# Patient Record
Sex: Female | Born: 1985
Health system: Southern US, Community
[De-identification: ages and names within clinical notes are randomized; demographics above are authoritative.]

## PROBLEM LIST (undated history)

## (undated) DIAGNOSIS — I10 Essential (primary) hypertension: Secondary | ICD-10-CM

## (undated) DIAGNOSIS — M329 Systemic lupus erythematosus, unspecified: Secondary | ICD-10-CM

## (undated) DIAGNOSIS — L409 Psoriasis, unspecified: Secondary | ICD-10-CM

## (undated) DIAGNOSIS — Z9981 Dependence on supplemental oxygen: Secondary | ICD-10-CM

## (undated) DIAGNOSIS — J45909 Unspecified asthma, uncomplicated: Secondary | ICD-10-CM

## (undated) DIAGNOSIS — E669 Obesity, unspecified: Secondary | ICD-10-CM

## (undated) DIAGNOSIS — J849 Interstitial pulmonary disease, unspecified: Secondary | ICD-10-CM

## (undated) DIAGNOSIS — R51 Headache: Secondary | ICD-10-CM

## (undated) DIAGNOSIS — Z9289 Personal history of other medical treatment: Secondary | ICD-10-CM

## (undated) DIAGNOSIS — J189 Pneumonia, unspecified organism: Secondary | ICD-10-CM

## (undated) DIAGNOSIS — I2699 Other pulmonary embolism without acute cor pulmonale: Secondary | ICD-10-CM

## (undated) DIAGNOSIS — G4730 Sleep apnea, unspecified: Secondary | ICD-10-CM

## (undated) DIAGNOSIS — O24419 Gestational diabetes mellitus in pregnancy, unspecified control: Secondary | ICD-10-CM

## (undated) DIAGNOSIS — G894 Chronic pain syndrome: Secondary | ICD-10-CM

## (undated) DIAGNOSIS — F32A Depression, unspecified: Secondary | ICD-10-CM

## (undated) DIAGNOSIS — I319 Disease of pericardium, unspecified: Secondary | ICD-10-CM

## (undated) DIAGNOSIS — IMO0002 Reserved for concepts with insufficient information to code with codable children: Secondary | ICD-10-CM

## (undated) DIAGNOSIS — M069 Rheumatoid arthritis, unspecified: Secondary | ICD-10-CM

## (undated) DIAGNOSIS — F329 Major depressive disorder, single episode, unspecified: Secondary | ICD-10-CM

## (undated) DIAGNOSIS — R519 Headache, unspecified: Secondary | ICD-10-CM

## (undated) HISTORY — DX: Systemic lupus erythematosus, unspecified: M32.9

## (undated) HISTORY — PX: INDUCED ABORTION: SHX677

## (undated) HISTORY — DX: Disease of pericardium, unspecified: I31.9

---
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
## 2004-01-30 ENCOUNTER — Emergency Department (HOSPITAL_COMMUNITY): Admission: EM | Admit: 2004-01-30 | Discharge: 2004-01-30 | Payer: Self-pay | Admitting: Emergency Medicine

## 2004-01-31 ENCOUNTER — Inpatient Hospital Stay (HOSPITAL_COMMUNITY): Admission: AD | Admit: 2004-01-31 | Discharge: 2004-01-31 | Payer: Self-pay | Admitting: Family Medicine

## 2004-05-05 ENCOUNTER — Ambulatory Visit (HOSPITAL_COMMUNITY): Admission: RE | Admit: 2004-05-05 | Discharge: 2004-05-05 | Payer: Self-pay | Admitting: *Deleted

## 2004-06-26 DIAGNOSIS — O24419 Gestational diabetes mellitus in pregnancy, unspecified control: Secondary | ICD-10-CM

## 2004-06-26 HISTORY — DX: Gestational diabetes mellitus in pregnancy, unspecified control: O24.419

## 2004-07-13 ENCOUNTER — Inpatient Hospital Stay (HOSPITAL_COMMUNITY): Admission: AD | Admit: 2004-07-13 | Discharge: 2004-07-13 | Payer: Self-pay | Admitting: *Deleted

## 2004-07-15 ENCOUNTER — Inpatient Hospital Stay (HOSPITAL_COMMUNITY): Admission: AD | Admit: 2004-07-15 | Discharge: 2004-07-15 | Payer: Self-pay | Admitting: *Deleted

## 2004-07-16 ENCOUNTER — Ambulatory Visit (HOSPITAL_COMMUNITY): Admission: RE | Admit: 2004-07-16 | Discharge: 2004-07-16 | Payer: Self-pay | Admitting: Obstetrics and Gynecology

## 2004-07-18 ENCOUNTER — Inpatient Hospital Stay (HOSPITAL_COMMUNITY): Admission: AD | Admit: 2004-07-18 | Discharge: 2004-07-21 | Payer: Self-pay | Admitting: Obstetrics and Gynecology

## 2004-10-06 ENCOUNTER — Emergency Department (HOSPITAL_COMMUNITY): Admission: EM | Admit: 2004-10-06 | Discharge: 2004-10-06 | Payer: Self-pay | Admitting: Family Medicine

## 2005-11-13 ENCOUNTER — Emergency Department (HOSPITAL_COMMUNITY): Admission: EM | Admit: 2005-11-13 | Discharge: 2005-11-13 | Payer: Self-pay | Admitting: Emergency Medicine

## 2005-11-14 ENCOUNTER — Ambulatory Visit: Admission: AD | Admit: 2005-11-14 | Discharge: 2005-11-14 | Payer: Self-pay | Admitting: Obstetrics and Gynecology

## 2005-11-14 ENCOUNTER — Encounter (INDEPENDENT_AMBULATORY_CARE_PROVIDER_SITE_OTHER): Payer: Self-pay | Admitting: *Deleted

## 2005-11-24 ENCOUNTER — Encounter: Payer: Self-pay | Admitting: Pulmonary Disease

## 2005-11-24 ENCOUNTER — Encounter: Payer: Self-pay | Admitting: Obstetrics and Gynecology

## 2005-11-24 ENCOUNTER — Emergency Department (HOSPITAL_COMMUNITY): Admission: EM | Admit: 2005-11-24 | Discharge: 2005-11-25 | Payer: Self-pay | Admitting: Emergency Medicine

## 2005-12-21 ENCOUNTER — Emergency Department (HOSPITAL_COMMUNITY): Admission: EM | Admit: 2005-12-21 | Discharge: 2005-12-21 | Payer: Self-pay | Admitting: Emergency Medicine

## 2006-01-04 ENCOUNTER — Emergency Department (HOSPITAL_COMMUNITY): Admission: EM | Admit: 2006-01-04 | Discharge: 2006-01-04 | Payer: Self-pay | Admitting: *Deleted

## 2006-02-21 ENCOUNTER — Emergency Department (HOSPITAL_COMMUNITY): Admission: EM | Admit: 2006-02-21 | Discharge: 2006-02-21 | Payer: Self-pay | Admitting: Emergency Medicine

## 2006-02-24 ENCOUNTER — Emergency Department (HOSPITAL_COMMUNITY): Admission: EM | Admit: 2006-02-24 | Discharge: 2006-02-24 | Payer: Self-pay | Admitting: Pediatric Gastroenterology

## 2006-04-25 ENCOUNTER — Emergency Department (HOSPITAL_COMMUNITY): Admission: EM | Admit: 2006-04-25 | Discharge: 2006-04-25 | Payer: Self-pay | Admitting: Emergency Medicine

## 2006-06-15 ENCOUNTER — Emergency Department (HOSPITAL_COMMUNITY): Admission: EM | Admit: 2006-06-15 | Discharge: 2006-06-15 | Payer: Self-pay | Admitting: Emergency Medicine

## 2006-06-29 ENCOUNTER — Emergency Department (HOSPITAL_COMMUNITY): Admission: EM | Admit: 2006-06-29 | Discharge: 2006-06-29 | Payer: Self-pay | Admitting: Emergency Medicine

## 2006-07-18 ENCOUNTER — Ambulatory Visit (HOSPITAL_COMMUNITY): Admission: RE | Admit: 2006-07-18 | Discharge: 2006-07-18 | Payer: Self-pay | Admitting: Internal Medicine

## 2006-07-18 ENCOUNTER — Ambulatory Visit: Payer: Self-pay | Admitting: Family Medicine

## 2006-07-20 ENCOUNTER — Ambulatory Visit: Payer: Self-pay | Admitting: Family Medicine

## 2006-07-20 ENCOUNTER — Ambulatory Visit: Payer: Self-pay | Admitting: *Deleted

## 2006-07-23 ENCOUNTER — Ambulatory Visit: Payer: Self-pay | Admitting: Family Medicine

## 2006-08-02 ENCOUNTER — Ambulatory Visit: Payer: Self-pay | Admitting: Family Medicine

## 2006-08-03 ENCOUNTER — Ambulatory Visit (HOSPITAL_COMMUNITY): Admission: RE | Admit: 2006-08-03 | Discharge: 2006-08-03 | Payer: Self-pay | Admitting: Internal Medicine

## 2006-08-13 ENCOUNTER — Ambulatory Visit (HOSPITAL_COMMUNITY): Admission: RE | Admit: 2006-08-13 | Discharge: 2006-08-13 | Payer: Self-pay | Admitting: Internal Medicine

## 2006-08-13 ENCOUNTER — Ambulatory Visit: Payer: Self-pay | Admitting: Family Medicine

## 2006-08-14 ENCOUNTER — Ambulatory Visit: Payer: Self-pay | Admitting: Family Medicine

## 2006-08-14 ENCOUNTER — Ambulatory Visit: Payer: Self-pay | Admitting: Internal Medicine

## 2006-08-14 ENCOUNTER — Inpatient Hospital Stay (HOSPITAL_COMMUNITY): Admission: AD | Admit: 2006-08-14 | Discharge: 2006-08-21 | Payer: Self-pay | Admitting: Internal Medicine

## 2006-08-17 ENCOUNTER — Encounter: Payer: Self-pay | Admitting: Internal Medicine

## 2006-08-22 ENCOUNTER — Ambulatory Visit: Payer: Self-pay | Admitting: Family Medicine

## 2006-09-07 ENCOUNTER — Ambulatory Visit: Payer: Self-pay | Admitting: Internal Medicine

## 2006-09-13 ENCOUNTER — Ambulatory Visit: Payer: Self-pay | Admitting: Internal Medicine

## 2006-09-27 ENCOUNTER — Ambulatory Visit: Payer: Self-pay | Admitting: Internal Medicine

## 2006-10-11 ENCOUNTER — Ambulatory Visit: Payer: Self-pay | Admitting: Internal Medicine

## 2006-10-18 ENCOUNTER — Ambulatory Visit: Payer: Self-pay | Admitting: Internal Medicine

## 2006-10-25 ENCOUNTER — Ambulatory Visit: Payer: Self-pay | Admitting: Internal Medicine

## 2006-11-08 ENCOUNTER — Ambulatory Visit: Payer: Self-pay | Admitting: Internal Medicine

## 2006-11-21 ENCOUNTER — Ambulatory Visit: Payer: Self-pay | Admitting: Family Medicine

## 2006-11-22 ENCOUNTER — Ambulatory Visit: Payer: Self-pay | Admitting: Internal Medicine

## 2006-11-23 ENCOUNTER — Inpatient Hospital Stay (HOSPITAL_COMMUNITY): Admission: EM | Admit: 2006-11-23 | Discharge: 2006-11-25 | Payer: Self-pay | Admitting: Emergency Medicine

## 2006-11-27 ENCOUNTER — Ambulatory Visit: Payer: Self-pay | Admitting: Internal Medicine

## 2006-12-04 ENCOUNTER — Ambulatory Visit: Payer: Self-pay | Admitting: Internal Medicine

## 2006-12-18 ENCOUNTER — Ambulatory Visit: Payer: Self-pay | Admitting: Internal Medicine

## 2006-12-25 ENCOUNTER — Ambulatory Visit: Payer: Self-pay | Admitting: Internal Medicine

## 2007-01-02 ENCOUNTER — Ambulatory Visit: Payer: Self-pay | Admitting: Internal Medicine

## 2007-01-11 ENCOUNTER — Ambulatory Visit: Payer: Self-pay | Admitting: Internal Medicine

## 2007-01-18 ENCOUNTER — Ambulatory Visit: Payer: Self-pay | Admitting: Internal Medicine

## 2007-01-18 LAB — CONVERTED CEMR LAB
ALT: 121 units/L — ABNORMAL HIGH (ref 0–35)
CO2: 22 meq/L (ref 19–32)
Calcium: 8.7 mg/dL (ref 8.4–10.5)
Chloride: 107 meq/L (ref 96–112)
Creatinine, Ser: 0.42 mg/dL (ref 0.40–1.20)
Eosinophils Relative: 7 % — ABNORMAL HIGH (ref 0–5)
Glucose, Bld: 83 mg/dL (ref 70–99)
HCT: 38.1 % (ref 36.0–46.0)
Hemoglobin: 12.4 g/dL (ref 12.0–15.0)
Lymphocytes Relative: 18 % (ref 12–46)
Lymphs Abs: 1 10*3/uL (ref 0.7–3.3)
Monocytes Absolute: 0.2 10*3/uL (ref 0.2–0.7)
Neutro Abs: 3.7 10*3/uL (ref 1.7–7.7)
RBC: 4.47 M/uL (ref 3.87–5.11)
Total Bilirubin: 0.4 mg/dL (ref 0.3–1.2)
Total Protein: 7.5 g/dL (ref 6.0–8.3)
WBC: 5.2 10*3/uL (ref 4.0–10.5)

## 2007-01-28 ENCOUNTER — Ambulatory Visit: Payer: Self-pay | Admitting: Internal Medicine

## 2007-02-04 ENCOUNTER — Ambulatory Visit: Payer: Self-pay | Admitting: Internal Medicine

## 2007-02-12 ENCOUNTER — Ambulatory Visit: Payer: Self-pay | Admitting: Internal Medicine

## 2007-02-13 ENCOUNTER — Encounter (INDEPENDENT_AMBULATORY_CARE_PROVIDER_SITE_OTHER): Payer: Self-pay | Admitting: Internal Medicine

## 2007-02-13 LAB — CONVERTED CEMR LAB
ALT: 156 units/L — ABNORMAL HIGH (ref 0–35)
Albumin: 4.2 g/dL (ref 3.5–5.2)
Alkaline Phosphatase: 106 units/L (ref 39–117)
CO2: 19 meq/L (ref 19–32)
Glucose, Bld: 88 mg/dL (ref 70–99)
Potassium: 4.2 meq/L (ref 3.5–5.3)
Sodium: 138 meq/L (ref 135–145)
Total Bilirubin: 0.4 mg/dL (ref 0.3–1.2)
Total Protein: 7.9 g/dL (ref 6.0–8.3)

## 2007-02-20 ENCOUNTER — Ambulatory Visit: Payer: Self-pay | Admitting: Internal Medicine

## 2007-03-13 ENCOUNTER — Encounter (INDEPENDENT_AMBULATORY_CARE_PROVIDER_SITE_OTHER): Payer: Self-pay | Admitting: *Deleted

## 2007-08-06 ENCOUNTER — Ambulatory Visit: Payer: Self-pay | Admitting: Internal Medicine

## 2007-09-18 ENCOUNTER — Ambulatory Visit: Payer: Self-pay | Admitting: Internal Medicine

## 2007-09-18 ENCOUNTER — Encounter: Payer: Self-pay | Admitting: Internal Medicine

## 2007-09-18 LAB — CONVERTED CEMR LAB: GC Probe Amp, Genital: NEGATIVE

## 2007-10-02 ENCOUNTER — Ambulatory Visit: Payer: Self-pay | Admitting: Internal Medicine

## 2007-10-09 ENCOUNTER — Encounter: Payer: Self-pay | Admitting: Pulmonary Disease

## 2007-10-09 ENCOUNTER — Encounter (INDEPENDENT_AMBULATORY_CARE_PROVIDER_SITE_OTHER): Payer: Self-pay | Admitting: *Deleted

## 2007-10-09 ENCOUNTER — Ambulatory Visit: Payer: Self-pay | Admitting: *Deleted

## 2007-10-09 ENCOUNTER — Inpatient Hospital Stay (HOSPITAL_COMMUNITY): Admission: EM | Admit: 2007-10-09 | Discharge: 2007-10-17 | Payer: Self-pay | Admitting: Emergency Medicine

## 2007-10-09 ENCOUNTER — Ambulatory Visit: Payer: Self-pay | Admitting: Internal Medicine

## 2007-10-14 ENCOUNTER — Encounter (INDEPENDENT_AMBULATORY_CARE_PROVIDER_SITE_OTHER): Payer: Self-pay | Admitting: *Deleted

## 2007-10-14 ENCOUNTER — Ambulatory Visit: Payer: Self-pay | Admitting: Cardiothoracic Surgery

## 2007-10-14 ENCOUNTER — Encounter: Payer: Self-pay | Admitting: Pulmonary Disease

## 2007-10-31 ENCOUNTER — Ambulatory Visit: Payer: Self-pay | Admitting: Internal Medicine

## 2007-11-15 ENCOUNTER — Ambulatory Visit: Payer: Self-pay | Admitting: Internal Medicine

## 2007-11-15 LAB — CONVERTED CEMR LAB
ALT: 36 units/L — ABNORMAL HIGH (ref 0–35)
AST: 24 units/L (ref 0–37)
Alkaline Phosphatase: 91 units/L (ref 39–117)
BUN: 14 mg/dL (ref 6–23)
Basophils Absolute: 0 10*3/uL (ref 0.0–0.1)
Basophils Relative: 0 % (ref 0–1)
Calcium: 8.9 mg/dL (ref 8.4–10.5)
Chloride: 104 meq/L (ref 96–112)
Creatinine, Ser: 0.51 mg/dL (ref 0.40–1.20)
Eosinophils Absolute: 0 10*3/uL (ref 0.0–0.7)
Eosinophils Relative: 0 % (ref 0–5)
HCT: 44.7 % (ref 36.0–46.0)
Hemoglobin: 13.7 g/dL (ref 12.0–15.0)
MCHC: 30.6 g/dL (ref 30.0–36.0)
Monocytes Absolute: 0.5 10*3/uL (ref 0.1–1.0)
Platelets: 256 10*3/uL (ref 150–400)
RDW: 16.3 % — ABNORMAL HIGH (ref 11.5–15.5)
Total Bilirubin: 0.5 mg/dL (ref 0.3–1.2)

## 2007-11-20 ENCOUNTER — Ambulatory Visit: Payer: Self-pay | Admitting: Internal Medicine

## 2007-12-24 ENCOUNTER — Ambulatory Visit: Payer: Self-pay | Admitting: Internal Medicine

## 2007-12-24 LAB — CONVERTED CEMR LAB
AST: 35 units/L (ref 0–37)
Basophils Absolute: 0.1 10*3/uL (ref 0.0–0.1)
CO2: 23 meq/L (ref 19–32)
Calcium: 9 mg/dL (ref 8.4–10.5)
Creatinine, Ser: 0.58 mg/dL (ref 0.40–1.20)
Eosinophils Absolute: 0 10*3/uL (ref 0.0–0.7)
Eosinophils Relative: 0 % (ref 0–5)
Glucose, Bld: 78 mg/dL (ref 70–99)
HCT: 44.8 % (ref 36.0–46.0)
Hemoglobin: 14.7 g/dL (ref 12.0–15.0)
Lymphocytes Relative: 18 % (ref 12–46)
MCV: 85.2 fL (ref 78.0–100.0)
Monocytes Absolute: 0.5 10*3/uL (ref 0.1–1.0)
RDW: 15.1 % (ref 11.5–15.5)

## 2007-12-25 ENCOUNTER — Ambulatory Visit (HOSPITAL_COMMUNITY): Admission: RE | Admit: 2007-12-25 | Discharge: 2007-12-25 | Payer: Self-pay | Admitting: Internal Medicine

## 2007-12-25 ENCOUNTER — Ambulatory Visit: Payer: Self-pay | Admitting: Internal Medicine

## 2007-12-25 LAB — CONVERTED CEMR LAB
Bilirubin Urine: NEGATIVE
Protein, ur: NEGATIVE mg/dL
Urine Glucose: NEGATIVE mg/dL
Urobilinogen, UA: 0.2 (ref 0.0–1.0)

## 2008-01-01 ENCOUNTER — Ambulatory Visit: Payer: Self-pay | Admitting: Internal Medicine

## 2008-01-01 LAB — CONVERTED CEMR LAB
BUN: 7 mg/dL (ref 6–23)
CO2: 24 meq/L (ref 19–32)
Creatinine, Ser: 0.57 mg/dL (ref 0.40–1.20)
Eosinophils Relative: 0 % (ref 0–5)
Glucose, Bld: 84 mg/dL (ref 70–99)
HCT: 44.5 % (ref 36.0–46.0)
Hemoglobin: 14.6 g/dL (ref 12.0–15.0)
Lymphocytes Relative: 23 % (ref 12–46)
Lymphs Abs: 2.6 10*3/uL (ref 0.7–4.0)
Monocytes Absolute: 0.8 10*3/uL (ref 0.1–1.0)
Monocytes Relative: 7 % (ref 3–12)
Total Bilirubin: 0.7 mg/dL (ref 0.3–1.2)
Total Protein: 7.8 g/dL (ref 6.0–8.3)
WBC: 11.4 10*3/uL — ABNORMAL HIGH (ref 4.0–10.5)

## 2008-01-16 ENCOUNTER — Ambulatory Visit: Payer: Self-pay | Admitting: Internal Medicine

## 2008-01-16 LAB — CONVERTED CEMR LAB
AST: 29 units/L (ref 0–37)
BUN: 8 mg/dL (ref 6–23)
Chloride: 106 meq/L (ref 96–112)
Eosinophils Absolute: 0.1 10*3/uL (ref 0.0–0.7)
Glucose, Bld: 89 mg/dL (ref 70–99)
Lymphs Abs: 2.6 10*3/uL (ref 0.7–4.0)
MCV: 88.1 fL (ref 78.0–100.0)
Monocytes Relative: 8 % (ref 3–12)
Neutrophils Relative %: 69 % (ref 43–77)
Potassium: 3.4 meq/L — ABNORMAL LOW (ref 3.5–5.3)
RBC: 4.69 M/uL (ref 3.87–5.11)
WBC: 11.6 10*3/uL — ABNORMAL HIGH (ref 4.0–10.5)

## 2008-02-19 ENCOUNTER — Ambulatory Visit: Payer: Self-pay | Admitting: Internal Medicine

## 2008-02-19 LAB — CONVERTED CEMR LAB
AST: 17 units/L (ref 0–37)
Alkaline Phosphatase: 65 units/L (ref 39–117)
BUN: 13 mg/dL (ref 6–23)
Basophils Relative: 0 % (ref 0–1)
Creatinine, Ser: 0.73 mg/dL (ref 0.40–1.20)
Eosinophils Absolute: 0 10*3/uL (ref 0.0–0.7)
Eosinophils Relative: 0 % (ref 0–5)
HCT: 41 % (ref 36.0–46.0)
Hemoglobin: 13.5 g/dL (ref 12.0–15.0)
MCHC: 32.9 g/dL (ref 30.0–36.0)
MCV: 86.1 fL (ref 78.0–100.0)
Monocytes Absolute: 0.2 10*3/uL (ref 0.1–1.0)
Monocytes Relative: 2 % — ABNORMAL LOW (ref 3–12)
RBC: 4.76 M/uL (ref 3.87–5.11)

## 2008-03-11 ENCOUNTER — Ambulatory Visit: Payer: Self-pay | Admitting: Internal Medicine

## 2008-03-11 LAB — CONVERTED CEMR LAB
ALT: 28 units/L (ref 0–35)
BUN: 15 mg/dL (ref 6–23)
Basophils Absolute: 0 10*3/uL (ref 0.0–0.1)
CO2: 23 meq/L (ref 19–32)
Calcium: 9.1 mg/dL (ref 8.4–10.5)
Creatinine, Ser: 0.51 mg/dL (ref 0.40–1.20)
Eosinophils Relative: 0 % (ref 0–5)
HCT: 46 % (ref 36.0–46.0)
Hemoglobin: 14.7 g/dL (ref 12.0–15.0)
Lymphocytes Relative: 7 % — ABNORMAL LOW (ref 12–46)
MCHC: 32 g/dL (ref 30.0–36.0)
Monocytes Absolute: 0.3 10*3/uL (ref 0.1–1.0)
Monocytes Relative: 2 % — ABNORMAL LOW (ref 3–12)
RBC: 5.17 M/uL — ABNORMAL HIGH (ref 3.87–5.11)
RDW: 13.3 % (ref 11.5–15.5)
Total Bilirubin: 0.6 mg/dL (ref 0.3–1.2)

## 2008-04-15 ENCOUNTER — Ambulatory Visit: Payer: Self-pay | Admitting: Internal Medicine

## 2008-04-15 LAB — CONVERTED CEMR LAB
Alkaline Phosphatase: 97 units/L (ref 39–117)
BUN: 18 mg/dL (ref 6–23)
CO2: 22 meq/L (ref 19–32)
Creatinine, Ser: 0.64 mg/dL (ref 0.40–1.20)
Eosinophils Absolute: 0.1 10*3/uL (ref 0.0–0.7)
Eosinophils Relative: 1 % (ref 0–5)
Glucose, Bld: 72 mg/dL (ref 70–99)
HCT: 41.5 % (ref 36.0–46.0)
Hemoglobin: 13.8 g/dL (ref 12.0–15.0)
Lymphocytes Relative: 22 % (ref 12–46)
Lymphs Abs: 1.9 10*3/uL (ref 0.7–4.0)
MCV: 88.7 fL (ref 78.0–100.0)
Monocytes Absolute: 1 10*3/uL (ref 0.1–1.0)
Monocytes Relative: 12 % (ref 3–12)
Platelets: 287 10*3/uL (ref 150–400)
RBC: 4.68 M/uL (ref 3.87–5.11)
Total Bilirubin: 0.5 mg/dL (ref 0.3–1.2)
WBC: 8.3 10*3/uL (ref 4.0–10.5)

## 2008-06-02 ENCOUNTER — Emergency Department (HOSPITAL_COMMUNITY): Admission: EM | Admit: 2008-06-02 | Discharge: 2008-06-02 | Payer: Self-pay | Admitting: Emergency Medicine

## 2008-06-10 ENCOUNTER — Ambulatory Visit: Payer: Self-pay | Admitting: Internal Medicine

## 2008-06-12 ENCOUNTER — Ambulatory Visit: Payer: Self-pay | Admitting: Internal Medicine

## 2008-06-24 ENCOUNTER — Ambulatory Visit: Payer: Self-pay | Admitting: Internal Medicine

## 2008-06-26 DIAGNOSIS — M069 Rheumatoid arthritis, unspecified: Secondary | ICD-10-CM

## 2008-06-26 DIAGNOSIS — M329 Systemic lupus erythematosus, unspecified: Secondary | ICD-10-CM

## 2008-06-26 DIAGNOSIS — L409 Psoriasis, unspecified: Secondary | ICD-10-CM

## 2008-06-26 DIAGNOSIS — I10 Essential (primary) hypertension: Secondary | ICD-10-CM

## 2008-06-26 DIAGNOSIS — IMO0002 Reserved for concepts with insufficient information to code with codable children: Secondary | ICD-10-CM

## 2008-06-26 HISTORY — DX: Psoriasis, unspecified: L40.9

## 2008-06-26 HISTORY — DX: Systemic lupus erythematosus, unspecified: M32.9

## 2008-06-26 HISTORY — DX: Reserved for concepts with insufficient information to code with codable children: IMO0002

## 2008-06-26 HISTORY — DX: Rheumatoid arthritis, unspecified: M06.9

## 2008-06-26 HISTORY — DX: Essential (primary) hypertension: I10

## 2008-10-05 ENCOUNTER — Emergency Department (HOSPITAL_COMMUNITY): Admission: EM | Admit: 2008-10-05 | Discharge: 2008-10-05 | Payer: Self-pay | Admitting: Emergency Medicine

## 2008-10-06 ENCOUNTER — Ambulatory Visit (HOSPITAL_COMMUNITY): Admission: RE | Admit: 2008-10-06 | Discharge: 2008-10-06 | Payer: Self-pay | Admitting: Obstetrics & Gynecology

## 2008-10-07 ENCOUNTER — Ambulatory Visit: Payer: Self-pay | Admitting: Obstetrics & Gynecology

## 2008-10-07 ENCOUNTER — Encounter: Payer: Self-pay | Admitting: Obstetrics and Gynecology

## 2008-10-07 LAB — CONVERTED CEMR LAB
Basophils Relative: 1 % (ref 0–1)
Eosinophils Absolute: 0.4 10*3/uL (ref 0.0–0.7)
Hemoglobin: 13.6 g/dL (ref 12.0–15.0)
Hepatitis B Surface Ag: NEGATIVE
Hgb A: 97.3 % (ref 96.8–97.8)
Hgb S Quant: 0 % (ref 0.0–0.0)
Lymphs Abs: 1.5 10*3/uL (ref 0.7–4.0)
MCHC: 33.3 g/dL (ref 30.0–36.0)
MCV: 81.4 fL (ref 78.0–100.0)
Monocytes Absolute: 0.4 10*3/uL (ref 0.1–1.0)
Monocytes Relative: 6 % (ref 3–12)
Neutrophils Relative %: 65 % (ref 43–77)
RBC: 5.01 M/uL (ref 3.87–5.11)
Rh Type: POSITIVE
Rubella: 11.7 intl units/mL — ABNORMAL HIGH
WBC: 6.5 10*3/uL (ref 4.0–10.5)

## 2008-10-12 ENCOUNTER — Ambulatory Visit: Payer: Self-pay | Admitting: Obstetrics & Gynecology

## 2008-10-12 ENCOUNTER — Encounter: Payer: Self-pay | Admitting: Obstetrics & Gynecology

## 2008-10-19 ENCOUNTER — Encounter: Admission: RE | Admit: 2008-10-19 | Discharge: 2008-10-19 | Payer: Self-pay | Admitting: Obstetrics & Gynecology

## 2008-10-19 ENCOUNTER — Ambulatory Visit: Payer: Self-pay | Admitting: Obstetrics & Gynecology

## 2008-10-26 ENCOUNTER — Ambulatory Visit: Payer: Self-pay | Admitting: Family Medicine

## 2008-11-07 ENCOUNTER — Emergency Department (HOSPITAL_COMMUNITY): Admission: EM | Admit: 2008-11-07 | Discharge: 2008-11-08 | Payer: Self-pay | Admitting: Emergency Medicine

## 2008-11-09 ENCOUNTER — Ambulatory Visit: Payer: Self-pay | Admitting: Obstetrics & Gynecology

## 2008-11-09 LAB — CONVERTED CEMR LAB
Anticardiolipin IgA: 9 (ref <13)
Anticardiolipin IgG: <7 (ref <11)

## 2008-11-30 ENCOUNTER — Ambulatory Visit (HOSPITAL_COMMUNITY): Admission: RE | Admit: 2008-11-30 | Discharge: 2008-11-30 | Payer: Self-pay | Admitting: Obstetrics & Gynecology

## 2008-12-07 ENCOUNTER — Ambulatory Visit: Payer: Self-pay | Admitting: Obstetrics & Gynecology

## 2008-12-21 ENCOUNTER — Ambulatory Visit: Payer: Self-pay | Admitting: Obstetrics & Gynecology

## 2008-12-21 ENCOUNTER — Ambulatory Visit: Payer: Self-pay | Admitting: Pulmonary Disease

## 2008-12-21 ENCOUNTER — Ambulatory Visit (HOSPITAL_COMMUNITY): Admission: RE | Admit: 2008-12-21 | Discharge: 2008-12-21 | Payer: Self-pay | Admitting: Family Medicine

## 2008-12-21 DIAGNOSIS — I319 Disease of pericardium, unspecified: Secondary | ICD-10-CM

## 2008-12-21 DIAGNOSIS — R0602 Shortness of breath: Secondary | ICD-10-CM | POA: Insufficient documentation

## 2008-12-21 HISTORY — DX: Disease of pericardium, unspecified: I31.9

## 2008-12-28 ENCOUNTER — Ambulatory Visit: Payer: Self-pay | Admitting: Obstetrics & Gynecology

## 2009-01-11 ENCOUNTER — Ambulatory Visit: Payer: Self-pay | Admitting: Obstetrics & Gynecology

## 2009-01-25 ENCOUNTER — Encounter: Payer: Self-pay | Admitting: Physician Assistant

## 2009-01-25 ENCOUNTER — Other Ambulatory Visit: Payer: Self-pay | Admitting: Obstetrics & Gynecology

## 2009-01-25 ENCOUNTER — Ambulatory Visit: Payer: Self-pay | Admitting: Obstetrics & Gynecology

## 2009-01-25 LAB — CONVERTED CEMR LAB
Platelets: 313 10*3/uL (ref 150–400)
RBC: 4.45 M/uL (ref 3.87–5.11)
WBC: 7.1 10*3/uL (ref 4.0–10.5)

## 2009-01-26 ENCOUNTER — Encounter: Payer: Self-pay | Admitting: Physician Assistant

## 2009-02-08 ENCOUNTER — Ambulatory Visit: Payer: Self-pay | Admitting: Family Medicine

## 2009-02-09 ENCOUNTER — Ambulatory Visit (HOSPITAL_COMMUNITY): Admission: RE | Admit: 2009-02-09 | Discharge: 2009-02-09 | Payer: Self-pay | Admitting: Obstetrics & Gynecology

## 2009-02-15 ENCOUNTER — Ambulatory Visit: Payer: Self-pay | Admitting: Obstetrics & Gynecology

## 2009-02-18 ENCOUNTER — Ambulatory Visit: Payer: Self-pay | Admitting: Obstetrics & Gynecology

## 2009-02-22 ENCOUNTER — Ambulatory Visit: Payer: Self-pay | Admitting: Obstetrics & Gynecology

## 2009-02-25 ENCOUNTER — Ambulatory Visit: Payer: Self-pay | Admitting: Obstetrics & Gynecology

## 2009-03-08 ENCOUNTER — Ambulatory Visit: Payer: Self-pay | Admitting: Obstetrics & Gynecology

## 2009-03-15 ENCOUNTER — Ambulatory Visit: Payer: Self-pay | Admitting: Obstetrics & Gynecology

## 2009-03-22 ENCOUNTER — Ambulatory Visit: Payer: Self-pay | Admitting: Obstetrics & Gynecology

## 2009-03-22 ENCOUNTER — Telehealth: Payer: Self-pay | Admitting: Pulmonary Disease

## 2009-03-29 ENCOUNTER — Encounter: Payer: Self-pay | Admitting: Advanced Practice Midwife

## 2009-03-29 ENCOUNTER — Ambulatory Visit: Payer: Self-pay | Admitting: Obstetrics & Gynecology

## 2009-03-29 LAB — CONVERTED CEMR LAB
ALT: <8 units/L (ref 0–35)
AST: 17 units/L (ref 0–37)
Albumin: 3.3 g/dL — ABNORMAL LOW (ref 3.5–5.2)
CO2: 19 meq/L (ref 19–32)
Calcium: 8.6 mg/dL (ref 8.4–10.5)
Chlamydia, DNA Probe: NEGATIVE
Chloride: 107 meq/L (ref 96–112)
Potassium: 3.9 meq/L (ref 3.5–5.3)
Sodium: 140 meq/L (ref 135–145)
Total Protein: 6.7 g/dL (ref 6.0–8.3)

## 2009-03-30 ENCOUNTER — Encounter: Payer: Self-pay | Admitting: Advanced Practice Midwife

## 2009-03-30 ENCOUNTER — Ambulatory Visit (HOSPITAL_COMMUNITY): Admission: RE | Admit: 2009-03-30 | Discharge: 2009-03-30 | Payer: Self-pay | Admitting: Obstetrics & Gynecology

## 2009-04-05 ENCOUNTER — Ambulatory Visit: Payer: Self-pay | Admitting: Obstetrics & Gynecology

## 2009-04-10 ENCOUNTER — Ambulatory Visit: Payer: Self-pay | Admitting: Advanced Practice Midwife

## 2009-04-10 ENCOUNTER — Inpatient Hospital Stay (HOSPITAL_COMMUNITY): Admission: AD | Admit: 2009-04-10 | Discharge: 2009-04-10 | Payer: Self-pay | Admitting: Obstetrics and Gynecology

## 2009-04-12 ENCOUNTER — Ambulatory Visit: Payer: Self-pay | Admitting: Obstetrics & Gynecology

## 2009-04-16 ENCOUNTER — Ambulatory Visit: Payer: Self-pay | Admitting: Obstetrics & Gynecology

## 2009-04-16 ENCOUNTER — Encounter: Payer: Self-pay | Admitting: Obstetrics & Gynecology

## 2009-04-16 ENCOUNTER — Inpatient Hospital Stay (HOSPITAL_COMMUNITY): Admission: AD | Admit: 2009-04-16 | Discharge: 2009-04-20 | Payer: Self-pay | Admitting: Obstetrics & Gynecology

## 2009-04-19 ENCOUNTER — Ambulatory Visit: Payer: Self-pay | Admitting: Pulmonary Disease

## 2009-05-24 ENCOUNTER — Ambulatory Visit: Payer: Self-pay | Admitting: Pulmonary Disease

## 2009-05-24 DIAGNOSIS — J8410 Pulmonary fibrosis, unspecified: Secondary | ICD-10-CM | POA: Insufficient documentation

## 2009-06-09 ENCOUNTER — Ambulatory Visit: Payer: Self-pay | Admitting: Internal Medicine

## 2009-06-09 LAB — CONVERTED CEMR LAB
ALT: 24 U/L
AST: 23 U/L
Albumin: 4.6 g/dL
Alkaline Phosphatase: 132 U/L — ABNORMAL HIGH
BUN: 11 mg/dL
Basophils Absolute: 0 K/uL
Basophils Relative: 0 %
CO2: 21 meq/L
Calcium: 9.6 mg/dL
Chloride: 105 meq/L
Cholesterol: 165 mg/dL
Creatinine, Ser: 0.51 mg/dL
Eosinophils Absolute: 0 K/uL
Eosinophils Relative: 0 %
Glucose, Bld: 100 mg/dL — ABNORMAL HIGH
HCT: 36.8 %
HDL: 41 mg/dL
Hemoglobin: 11.1 g/dL — ABNORMAL LOW
LDL Cholesterol: 101 mg/dL — ABNORMAL HIGH
Lymphocytes Relative: 24 %
Lymphs Abs: 1.8 K/uL
MCHC: 30.2 g/dL
MCV: 74.5 fL — ABNORMAL LOW
Monocytes Absolute: 0.1 K/uL
Monocytes Relative: 2 % — ABNORMAL LOW
Neutro Abs: 5.7 K/uL
Neutrophils Relative %: 74 %
Platelets: 431 K/uL — ABNORMAL HIGH
Potassium: 4.6 meq/L
Preg, Serum: NEGATIVE
RBC: 4.94 M/uL
RDW: 15.6 % — ABNORMAL HIGH
Sodium: 138 meq/L
Total Bilirubin: 0.2 mg/dL — ABNORMAL LOW
Total CHOL/HDL Ratio: 4
Total Protein: 8.7 g/dL — ABNORMAL HIGH
Triglycerides: 114 mg/dL
VLDL: 23 mg/dL
WBC: 7.6 10*3/microliter

## 2009-06-26 HISTORY — PX: THIGH / KNEE SOFT TISSUE BIOPSY: SUR151

## 2009-08-02 DIAGNOSIS — M3390 Dermatopolymyositis, unspecified, organ involvement unspecified: Secondary | ICD-10-CM

## 2009-09-01 ENCOUNTER — Ambulatory Visit: Payer: Self-pay | Admitting: Internal Medicine

## 2009-09-20 DIAGNOSIS — J8489 Other specified interstitial pulmonary diseases: Secondary | ICD-10-CM | POA: Insufficient documentation

## 2009-09-20 DIAGNOSIS — J849 Interstitial pulmonary disease, unspecified: Secondary | ICD-10-CM | POA: Insufficient documentation

## 2010-02-24 ENCOUNTER — Ambulatory Visit: Payer: Self-pay | Admitting: Internal Medicine

## 2010-02-24 LAB — CONVERTED CEMR LAB: Preg, Serum: NEGATIVE

## 2010-02-25 ENCOUNTER — Ambulatory Visit: Payer: Self-pay | Admitting: Internal Medicine

## 2010-04-19 ENCOUNTER — Encounter (INDEPENDENT_AMBULATORY_CARE_PROVIDER_SITE_OTHER): Payer: Self-pay | Admitting: Internal Medicine

## 2010-04-19 LAB — CONVERTED CEMR LAB: Preg, Serum: NEGATIVE

## 2010-07-17 ENCOUNTER — Encounter: Payer: Self-pay | Admitting: Internal Medicine

## 2010-07-17 ENCOUNTER — Encounter: Payer: Self-pay | Admitting: *Deleted

## 2010-07-18 ENCOUNTER — Encounter: Payer: Self-pay | Admitting: *Deleted

## 2010-09-29 ENCOUNTER — Emergency Department (HOSPITAL_COMMUNITY)
Admission: EM | Admit: 2010-09-29 | Discharge: 2010-09-29 | Disposition: A | Discharge status: Home / Self Care | Payer: Self-pay | Attending: Emergency Medicine | Admitting: Emergency Medicine

## 2010-09-29 ENCOUNTER — Emergency Department (HOSPITAL_COMMUNITY): Payer: Self-pay

## 2010-09-29 DIAGNOSIS — R079 Chest pain, unspecified: Secondary | ICD-10-CM | POA: Insufficient documentation

## 2010-09-29 DIAGNOSIS — R059 Cough, unspecified: Secondary | ICD-10-CM | POA: Insufficient documentation

## 2010-09-29 DIAGNOSIS — IMO0001 Reserved for inherently not codable concepts without codable children: Secondary | ICD-10-CM | POA: Insufficient documentation

## 2010-09-29 DIAGNOSIS — J8410 Pulmonary fibrosis, unspecified: Secondary | ICD-10-CM | POA: Insufficient documentation

## 2010-09-29 DIAGNOSIS — J3489 Other specified disorders of nose and nasal sinuses: Secondary | ICD-10-CM | POA: Insufficient documentation

## 2010-09-29 DIAGNOSIS — J189 Pneumonia, unspecified organism: Secondary | ICD-10-CM | POA: Insufficient documentation

## 2010-09-29 DIAGNOSIS — R5381 Other malaise: Secondary | ICD-10-CM | POA: Insufficient documentation

## 2010-09-29 DIAGNOSIS — R05 Cough: Secondary | ICD-10-CM | POA: Insufficient documentation

## 2010-09-29 DIAGNOSIS — R221 Localized swelling, mass and lump, neck: Secondary | ICD-10-CM | POA: Insufficient documentation

## 2010-09-29 DIAGNOSIS — J45909 Unspecified asthma, uncomplicated: Secondary | ICD-10-CM | POA: Insufficient documentation

## 2010-09-29 DIAGNOSIS — R220 Localized swelling, mass and lump, head: Secondary | ICD-10-CM | POA: Insufficient documentation

## 2010-09-29 DIAGNOSIS — L408 Other psoriasis: Secondary | ICD-10-CM | POA: Insufficient documentation

## 2010-09-29 DIAGNOSIS — H9209 Otalgia, unspecified ear: Secondary | ICD-10-CM | POA: Insufficient documentation

## 2010-09-29 LAB — COMPREHENSIVE METABOLIC PANEL
AST: 34 U/L (ref 0–37)
Albumin: 2.2 g/dL — ABNORMAL LOW (ref 3.5–5.2)
Alkaline Phosphatase: 270 U/L — ABNORMAL HIGH (ref 39–117)
Chloride: 105 mEq/L (ref 96–112)
GFR calc Af Amer: >60 mL/min (ref >60)
Potassium: 4.3 mEq/L (ref 3.5–5.1)
Total Bilirubin: 0.1 mg/dL — ABNORMAL LOW (ref 0.3–1.2)
Total Protein: 5.9 g/dL — ABNORMAL LOW (ref 6.0–8.3)

## 2010-09-29 LAB — CBC
HCT: 22.6 % — ABNORMAL LOW (ref 36.0–46.0)
HCT: 31.7 % — ABNORMAL LOW (ref 36.0–46.0)
Hemoglobin: 7.4 g/dL — CL (ref 12.0–15.0)
Hemoglobin: 7.9 g/dL — CL (ref 12.0–15.0)
MCHC: 32 g/dL (ref 30.0–36.0)
MCHC: 32.2 g/dL (ref 30.0–36.0)
MCHC: 32.9 g/dL (ref 30.0–36.0)
MCV: 70.5 fL — ABNORMAL LOW (ref 78.0–100.0)
MCV: 70.8 fL — ABNORMAL LOW (ref 78.0–100.0)
Platelets: 236 10*3/uL (ref 150–400)
Platelets: 275 10*3/uL (ref 150–400)
Platelets: 279 10*3/uL (ref 150–400)
RBC: 3.2 MIL/uL — ABNORMAL LOW (ref 3.87–5.11)
RBC: 3.48 MIL/uL — ABNORMAL LOW (ref 3.87–5.11)
RDW: 17.2 % — ABNORMAL HIGH (ref 11.5–15.5)
WBC: 5.7 10*3/uL (ref 4.0–10.5)
WBC: 8 10*3/uL (ref 4.0–10.5)

## 2010-09-29 LAB — POCT URINALYSIS DIP (DEVICE)
Bilirubin Urine: NEGATIVE
Bilirubin Urine: NEGATIVE
Glucose, UA: NEGATIVE mg/dL
Hgb urine dipstick: NEGATIVE
Ketones, ur: NEGATIVE mg/dL
Nitrite: NEGATIVE
Nitrite: NEGATIVE
Specific Gravity, Urine: 1.025 (ref 1.005–1.030)
pH: 6 (ref 5.0–8.0)

## 2010-09-29 LAB — RPR: RPR Ser Ql: NONREACTIVE

## 2010-09-30 LAB — POCT URINALYSIS DIP (DEVICE)
Hgb urine dipstick: NEGATIVE
Hgb urine dipstick: NEGATIVE
Hgb urine dipstick: NEGATIVE
Ketones, ur: NEGATIVE mg/dL
Ketones, ur: NEGATIVE mg/dL
Ketones, ur: NEGATIVE mg/dL
Protein, ur: 30 mg/dL — AB
Protein, ur: NEGATIVE mg/dL
Protein, ur: NEGATIVE mg/dL
Specific Gravity, Urine: 1.025 (ref 1.005–1.030)
Specific Gravity, Urine: 1.02 (ref 1.005–1.030)
Urobilinogen, UA: 0.2 mg/dL (ref 0.0–1.0)
Urobilinogen, UA: 0.2 mg/dL (ref 0.0–1.0)
pH: 6.5 (ref 5.0–8.0)
pH: 6.5 (ref 5.0–8.0)
pH: 6.5 (ref 5.0–8.0)

## 2010-09-30 LAB — GLUCOSE, CAPILLARY: Glucose-Capillary: 82 mg/dL (ref 70–99)

## 2010-10-01 LAB — POCT URINALYSIS DIP (DEVICE)
Bilirubin Urine: NEGATIVE
Glucose, UA: NEGATIVE mg/dL
Hgb urine dipstick: NEGATIVE
Hgb urine dipstick: NEGATIVE
Hgb urine dipstick: NEGATIVE
Ketones, ur: NEGATIVE mg/dL
Nitrite: NEGATIVE
Nitrite: NEGATIVE
Nitrite: NEGATIVE
Protein, ur: NEGATIVE mg/dL
Protein, ur: NEGATIVE mg/dL
Protein, ur: NEGATIVE mg/dL
Protein, ur: 100 mg/dL — AB
Specific Gravity, Urine: 1.02 (ref 1.005–1.030)
Specific Gravity, Urine: 1.025 (ref 1.005–1.030)
Urobilinogen, UA: 0.2 mg/dL (ref 0.0–1.0)
Urobilinogen, UA: 1 mg/dL (ref 0.0–1.0)
Urobilinogen, UA: 0.2 mg/dL (ref 0.0–1.0)
pH: 6 (ref 5.0–8.0)
pH: 6.5 (ref 5.0–8.0)
pH: 7 (ref 5.0–8.0)

## 2010-10-02 LAB — POCT URINALYSIS DIP (DEVICE)
Hgb urine dipstick: NEGATIVE
Nitrite: NEGATIVE
Nitrite: NEGATIVE
Protein, ur: 30 mg/dL — AB
Urobilinogen, UA: 1 mg/dL (ref 0.0–1.0)
Urobilinogen, UA: 1 mg/dL (ref 0.0–1.0)
pH: 6 (ref 5.0–8.0)
pH: 6 (ref 5.0–8.0)

## 2010-10-03 LAB — POCT URINALYSIS DIP (DEVICE)
Glucose, UA: NEGATIVE mg/dL
Glucose, UA: NEGATIVE mg/dL
Hgb urine dipstick: NEGATIVE
Nitrite: NEGATIVE
Nitrite: NEGATIVE
Protein, ur: 30 mg/dL — AB
Urobilinogen, UA: 1 mg/dL (ref 0.0–1.0)
Urobilinogen, UA: 0.2 mg/dL (ref 0.0–1.0)

## 2010-10-04 LAB — POCT URINALYSIS DIP (DEVICE)
Glucose, UA: NEGATIVE mg/dL
Glucose, UA: NEGATIVE mg/dL
Ketones, ur: NEGATIVE mg/dL
Nitrite: NEGATIVE
Protein, ur: NEGATIVE mg/dL
pH: 5.5 (ref 5.0–8.0)

## 2010-10-05 LAB — POCT URINALYSIS DIP (DEVICE)
Bilirubin Urine: NEGATIVE
Bilirubin Urine: NEGATIVE
Glucose, UA: NEGATIVE mg/dL
Glucose, UA: NEGATIVE mg/dL
Hgb urine dipstick: NEGATIVE
Ketones, ur: NEGATIVE mg/dL
Nitrite: NEGATIVE
Nitrite: NEGATIVE
Nitrite: NEGATIVE
Protein, ur: NEGATIVE mg/dL
Specific Gravity, Urine: 1.025 (ref 1.005–1.030)
Urobilinogen, UA: 0.2 mg/dL (ref 0.0–1.0)
pH: 6.5 (ref 5.0–8.0)

## 2010-10-05 LAB — URINALYSIS, ROUTINE W REFLEX MICROSCOPIC
Nitrite: NEGATIVE
Specific Gravity, Urine: 1.028 (ref 1.005–1.030)
Urobilinogen, UA: 1 mg/dL (ref 0.0–1.0)

## 2010-10-05 LAB — WET PREP, GENITAL: Trich, Wet Prep: NONE SEEN

## 2010-10-05 LAB — URINE MICROSCOPIC-ADD ON

## 2010-10-05 LAB — GC/CHLAMYDIA PROBE AMP, GENITAL
Chlamydia, DNA Probe: NEGATIVE
GC Probe Amp, Genital: NEGATIVE

## 2010-10-26 DIAGNOSIS — M359 Systemic involvement of connective tissue, unspecified: Secondary | ICD-10-CM | POA: Insufficient documentation

## 2010-11-08 NOTE — Discharge Summary (Signed)
Donna Hall, Donna Hall       ACCOUNT NO.:  [id number]   MEDICAL RECORD NO.:  [id number]          PATIENT TYPE:  INP   LOCATION:  6702                         FACILITY:  [facility]   PHYSICIAN:  Manning Charity, MD     DATE OF BIRTH:  1985-09-30   DATE OF ADMISSION:  10/09/2007  DATE OF DISCHARGE:  10/17/2007                               DISCHARGE SUMMARY   CONSULTANT:  Aundra Dubin, MD, rheumatologist.   DISCHARGE DIAGNOSES:  1. 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  2. Anemia, likely anemia of chronic disease.  3. Interstitial lung disease.  4. Diffuse hepatocellular disease, on BS in February 2008.      Ultrasound, 2008, fatty infiltration with status post biopsy in      2009 that showed grade 2 steatosis, no periportal inflammation.   DISCHARGE MEDICATIONS:  1. Plaquenil 400 mg 1 tablet daily.  2. Prednisone 30 mg twice a day.  3. Albuterol inhale 2 sprays every 6 hours as needed.  4. Flovent inhale 2 puffs daily.  5. Oxycodone 5 mg 1 tablet every 6 hours for pain as needed.   DISPOSITION ON FOLLOWUP:  1. [name], she has an appointment scheduled with Dr. Park Breed,      rheumatologist, at [facility] on October 24, 2007, at 3:45 p.m.      During that appointment, she will need prescription for      continuation of prednisone and Plaquenil also.  2. She also has an appointment with Dr. Reche Dixon at [facility]      on Oct 31, 2007, at 10:30 a.m.  She will also need CT chest to      follow up mediastinal lymphadenopathy.  She would need a CBC      followup, to follow up her anemia and a repeat ferritin level to      rule out iron deficiency anemia. The patient will need a 2-D echo      in 2 weeks to follow up pericardial effusion. She will need      pulmonology referral.  3. She also has an appointment scheduled with Dr. Burgess Estelle,      ophthalmologist, on December 09, 2007, at 9 a.m. for an eye exam      because she was  started on Plaquenil.   PROCEDURES PERFORMED:  1. CT of the chest, which showed mediastinal and hilar      adenopathy/pericardial effusion.  Findings compatible with      bilateral lower lobe and to a lesser grade left upper lobe      pneumonia.  Small pleural effusion.  Fatty liver.  Note that a 2007      CT also revealed adenopathy involving the hilum and mediastinum,      only the mediastinum to a lesser degree.  2. A 2-D echo on October 09, 2007, which showed overall left ventricular      systolic function was normal, left ventricular ejection fraction      was estimated to be 60%.  There were no left ventricular regional      wall motion abnormalities.  There was moderate-to-large      circumferential pericardial effusion with the largest collection      posterolateral to the heart.  There were mild respirophasic changes      and very mild flattening of the RV during diastolics, but no overt      tamponade.  There was a moderate-to-large echo-free pericardial      effusion circumferential to the heart.  3. Repeated 2-D echo on October 14, 2007, showed overall left      ventricular function of 60% to 65%, overall pericardial effusion is      smaller than previous, but there remained a moderate-to-large      lateral effusion.  No evidence of tamponade.   CONSULTATIONS:  Dr. Kellie Simmering, rheumatologist, was consulted to help with  the treatment of mixed connective tissue disease.  We really appreciate  his recommendation.   HISTORY OF PRESENT ILLNESS:  [name] is a 25 year old female with  past medical history of psoriasiform rash and suspicion for autoimmune  lung disease, who presents with 12-month history of cough with productive  sputum, progressive shortness of breath, and progressive dyspnea over  the last 3 days.  She related that she has had a fever, occasional  hemoptysis, and weight loss.  She also has a skin lesion on her arm and  abdomen.  She had, in March 2009, a muscle  biopsy and a liver biopsy at  [facility].   MEDICATION:  Indomethacin 50 mg p.o. t.i.d.   PHYSICAL EXAMINATION:  VITAL SIGNS:  Temperature 103.5, blood pressure  111/73, pulse 131, respirations 36, and oxygen saturation 92% on room  air.  GENERAL:  She was alert, awake, pale, and in moderate respiratory  distress.  EYES:  Pupils equal and reactive to light.  Extraocular muscles intact.  EARS, NOSE, AND THROAT:  Oropharynx is clear.  Ulcer on the right  tongue.  NECK:  Supple.  RESPIRATIONS:  She has diffuse, fine crackles, bibasilar.  Also, she has  scattered wheezes.  CARDIOVASCULAR:  S1 and S2.  Systolic murmur.  Tachycardic.  Positive  pericardial rub.  GASTROINTESTINAL:  Bowel sounds positive.  Nontender and nondistended.  EXTREMITIES:  Lower extremity edema 1+, bilateral.  SKIN:  On the arm, abdomen, and leg with papular rash.  NEUROLOGIC:  Cranial nerves II through XII intact.  Motor strength 5/5  throughout.  Sensory grossly intact.  Cerebellar intact.  PSYCH:  Appropriate.   LABORATORY DATA:  Sodium 137, potassium 3.1, chloride 100, BUN 6,  creatinine 0.6, and glucose 91.  Bilirubin 0.5, alkaline phosphatase 64,  AST 39, ALT 16, protein 7.1, and albumin 2.4.  White blood cell 5.0,  hemoglobin 10.6, platelets 295, hematocrit 29.3, and MCV 81.  Chest x-  ray, bilateral air space disease, superficial for pneumonia.   PROBLEMS:  1. Dyspnea, productive cough, and fever.  [name] was admitted to      the Step-Down Unit.  We were considering that she had pneumonia      plus underlying interstitial lung disease because of the fever.      She finished a course of 5 days of broad spectrum antibiotics.      Sputum AFB x1 was negative.  Blood culture x2 negative.  Legionella      antibody in the urine was negative.  Strep pneumoniae in urine was      also negative.  During hospitalization, she was afebrile, and the      cough and dyspnea improved.  1. Mixed connective tissue  disease.  We were considering that Ms.      Donna Hall has a mixed connective tissue disease, ANA was positive, 1-      640 and anti-Smith antibody was positive, 2.7.  LAO antibody was      less than 0.2, it was normal.  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  The CT scan of the chest showed      pericardial effusion, so we were considering that a pericardial      effusion most likely secondary from autoimmune disease, likely      secondary to lupus because she had ANA positive and anti-Smith      positive.  CVTS was consulted, and there was no indication for a      pericardial window at that time.  They have recommended 2-D echo in      2 weeks.  She was started on IV prednisolone 100 mg twice a day for      2 days.  Then, we tapered to 80 mg every 8 hours and then to every      12 hours.  She responded to this treatment.  Her rash improved, and      her shortness of breath also improved.  During this      hospitalization, we also started her also on Plaquenil for lupus.      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   1. Interstitial lung disease.  CT scan of the chest showed very major      interstitial pattern.  She had a prior CT of chest that also showed      interstitial pattern.  On this admission, they could not rule out      pneumonia.  She will need pulmonology referral.  Follow up on      lymphadenopathy.   1. Anemia.  Normocytic, normochromic.  We are considering that this      could be anemia of chronic disease likely iron deficiency anemia.      Anemia panel was ordered.  Retic count was 0.9, reticulocyte was      34.7, iron 23 and low, total iron binding capacity 214 and low,      saturation 11.  B12 of 475.  Folate 6.3.  Ferritin 162 which  is      indeterminate given acute illness and should be repeated when more      stable.   1. Leukopenia.  On the second day of hospitalization, her white blood      cells decreased to 2.3.  We repeated x2 to check for lab error and      it continued to be 2.3.  On the next day, her white blood cells      were normal at 5.0, and they remained within the normal limits.      This needs to be followed up as an outpatient.  Consider hematology      consult.   DISCHARGE LABS AND VITALS:  On the day of discharge, [name] was in  good condition.  She was feeling very well.  She related that her  shortness of breath was better.  Her vitals signs; temperature  97.7,  blood pressure 111/65, pulse 65, respirations 18, and O2 saturation on  room air 97%.  She was in no acute distress.  Cardiovascular, S1 and S2.  Normal regular rhythm and rate.  Pulmonary, she has bilateral air  movement.  She has bilateral crackles at the base.  Extremities, pulse  positive.  No edema.  Labs; white blood cells 5.5, hemoglobin 10.0,  hematocrit 30.0, and platelets 343.  Sodium 141 and potassium 3.6.      Hartley Barefoot, MD  Electronically Signed      Manning Charity, MD  Electronically Signed    BR/MEDQ  D:  10/17/2007  T:  10/18/2007  Job:  [id number]

## 2010-11-08 NOTE — Discharge Summary (Signed)
NAMEVERNELLE, WISNER       ACCOUNT NO.:  [id number]   MEDICAL RECORD NO.:  [id number]          PATIENT TYPE:  INP   LOCATION:  1619                         FACILITY:  [facility]   PHYSICIAN:  Isidor Holts, M.D.  DATE OF BIRTH:  14-Jan-1986   DATE OF ADMISSION:  11/23/2006  DATE OF DISCHARGE:                               DISCHARGE SUMMARY   PRIMARY MEDICAL DOCTOR:  Dr. Reche Dixon at [facility].   DISCHARGE DIAGNOSES:  1. Severe psoriasis.  2. Psoriatic arthropathy.  3. Iron deficiency anemia.   DISCHARGE MEDICATIONS:  1. Hydrocortisone 1% cream topically b.i.d. to affected areas of skin,      except the face, twice daily.  2. Ibuprofen 400 mg p.o. t.i.d. with food for 10 days.  3. Ferrous sulfate 325 mg p.o. b.i.d.   PROCEDURES:  Two-view chest x-ray dated Nov 23, 2006.  This showed low  lung volumes and cardiomegaly without acute disease.   CONSULTATIONS:  None.   ADMISSION HISTORY:  As in H&P notes of Nov 23, 2006, dictated by Dr.  Hannah Beat.  However, in brief, this is a 25 year old female, with  questionable history of lupus erythematosus and no other significant  past medical history, who presents with a rash, swelling and pain of the  hands, reportedly of 2 days' duration, associated with subjective fever  as well as generalized aches and pains.  On initial evaluation she was  found to have a sed rate that was elevated at 63.  She was admitted for  further evaluation, investigation and management.   CLINICAL COURSE:  #1 - PSORIASIS.  The patient has a questionable  history of lupus erythematosus.  It is not clear what the basis of this  diagnosis is.  However, clinically she was found to have wide-spread  symmetrically distributed raised erythematous plaques covered with  silvery scales with a predilection for the extensor surfaces of the arms  and the knees, consistent with psoriasis.  In addition, she also had  spindling of the fingers bilaterally and  dystrophic nail changes.  She  was managed with topical Hydrocortisone cream and over the short period  of hospitalization, appreciable improvement in skin lesions was noted.  She was also commenced on NSAID therapy, for psoriatic arthropathy.   #2 - PSORIATIC ARTHROPATHY.  Refer to #1 above.  The patient was managed  with scheduled NSAID therapy, with significant improvement in  arthralgia.   #3 - IRON DEFICIENCY ANEMIA.  At the time of presentation the patient  was noted to have a mild anemia with hemoglobin of 10.4, hematocrit  30.7, MCV 82.  Iron studies demonstrated iron of 28, TIBC 374,  percentage saturation 7, B12 was 601, folate 10.5, ferritin 65.  These  features are consistent with iron deficiency.  The patient has been  therefore commenced on iron supplements.   DISPOSITION:  As of Nov 24, 2006, the patient was very keen to be  discharged home.  According to her, she has a 42-year-old daughter and  she cannot stay in the hospital for a prolonged period of time, away  from her daughter.  Attempts to persuade her to wait until November 26, 2006,  so that we could at least attempt to arrange dermatologic followup, were  not successful.  We have therefore recommended that the patient's  primary M.D., Dr. Reche Dixon at [facility], arrange a dermatologic referral  as the patient will certainly need a dermatologist.  We understand that  this can also be obtained at [facility] where they have a program  which will be able to accommodate the patient's financial means, or lack  thereof.  Perhaps this is worth a try.  The patient was discharged on  November 25, 2006.   ACTIVITY:  As tolerated.   DIET:  No restrictions.   FOLLOWUP INSTRUCTIONS:  The patient is instructed to follow up with her  primary M.D., Dr. Reche Dixon, medical department at [facility], within the  coming week.   Note septic workup including urinalysis, chest x-ray, blood cultures  were negative. HIV test was  negative.Isidor Holts, M.D.  Electronically Signed     CO/MEDQ  D:  11/25/2006  T:  11/25/2006  Job:  [id number]   cc:   Dineen Kid. Reche Dixon, M.D.  Fax: [phone]

## 2010-11-08 NOTE — H&P (Signed)
NAME:  Donna Hall, Donna Hall       ACCOUNT NO.:  [id number]   MEDICAL RECORD NO.:  [id number]          PATIENT TYPE:  INP   LOCATION:  0110                         FACILITY:  [facility]   PHYSICIAN:  Hettie Holstein, D.O.    DATE OF BIRTH:  Jun 13, 1986   DATE OF ADMISSION:  11/23/2006  DATE OF DISCHARGE:                              HISTORY & PHYSICAL   PRIMARY CARE PHYSICIAN:  Dr. Reche Dixon and [facility].   CHIEF COMPLAINT:  Rash and swelling and pain of the hand.   HISTORY OF PRESENT ILLNESS:  The patient is a pleasant 25 year old  [place] female who presents today with the past couple of days of  fever and also mid-epigastric discomfort and generalized pain and prior  laboratory workup according to medical records suggestive of lupus;  however, her clinical picture appears to be more consistent with  psoriasis.  In any event she has a fever of undetermined etiology at  present.  We are admitting her for further workup and evaluation.   PAST MEDICAL HISTORY:  As noted above.  Questionable lupus, though more  clinically appearing as psoriasis.  No prior history of diabetes or  hypertension.  No prior surgeries.   ALLERGIES:  NO KNOWN DRUG ALLERGIES.   MEDICATIONS:  Vicodin 7.5/750 as well Celebrex 200 mg daily.   SOCIAL HISTORY:  She lives with her husband and 38-year-old daughter.  They are Spanish speaking and are from [place].   FAMILY HISTORY:  Noncontributory.   REVIEW OF SYSTEMS:  She has had some nausea but no vomiting.  Some  midepigastric discomfort.  No swelling of her extremities.  No headache.   PHYSICAL EXAMINATION:  VITAL SIGNS:  Blood pressure 101/61, heart rate  81, respirations 16, temperature 100.1, O2 saturation 100%.  HEENT:  Reveals the head to be normocephalic, atraumatic.  Extraocular  muscles are intact.  NECK:  Supple, nontender.  No palpable thyromegaly or mass.  SKIN:  She does have a very prominent rash symmetrically distributed,  well demarcated,  erythematous plaques with a silvery scale noted on the  extensor surfaces of her upper extremities as well as her knees.  She  does have an erythematous rash over her trunk and upper abdomen as well  as a shawl distribution over her neck and also on her back.  CARDIOVASCULAR:  Exam reveals normal S1-S2.  LUNGS:  Clear bilaterally.  She exhibits normal effort.  ABDOMEN:  Abdomen is soft.  No rebound or guarding.  EXTREMITIES:  Extremities are as described above.  There is no  involvement over the palms or the soles of her feet.   LABORATORY DATA:  Laboratory data revealed only a sed rate of 63.  Urinalysis was unremarkable.  Urine pregnancy was negative as well.  No  other labs were ordered at this time.   ASSESSMENT:  1. Febrile illness, undetermined etiology at this time.  2. Psoriatic flare.  3. Midepigastric discomfort and pain.   PLAN:  At this time will administer gentle IV fluids, IV PPI times 1.  Will obtain blood cultures and follow her clinical course in hospital.  She will likely need a Dermatology consultation  first thing in the  morning or when office is open, and will administer topical steroids at  this time.  She may need either trichotomous or Dovonex  or other agents at the discretion of Dermatology consultation.      Hettie Holstein, D.O.  Electronically Signed     ESS/MEDQ  D:  11/23/2006  T:  11/23/2006  Job:  [id number]   cc:   Dineen Kid. Reche Dixon, M.D.  Fax: [phone]

## 2010-11-11 NOTE — Discharge Summary (Signed)
[name], [name]       ACCOUNT NO.:  [id number]   MEDICAL RECORD NO.:  [id number]          PATIENT TYPE:  INP   LOCATION:  5125                         FACILITY:  [facility]   PHYSICIAN:  Beckey Rutter, MD  DATE OF BIRTH:  10/18/1985   DATE OF ADMISSION:  08/14/2006  DATE OF DISCHARGE:  08/20/2006                               DISCHARGE SUMMARY   CHIEF COMPLAINT ON ADMISSION:  Rash and joint aches.   HISTORY OF PRESENT ILLNESS:  Briefly, [name] is a 25 year old  Hispanic-speaking female with a rash, joint swelling, myalgia and low  grade fever.  She was diagnosed before with a skin biopsy as lupus.  During hospitalization patient received IV steroid as well as topical  steroid cream, hydrocortisone, with improvement in overall condition as  well as the aches and the itching.  A Rheumatology consult was called on  the 20th of February. I called again yesterday 25th/2 to convey the  message that we still waiting for Rheumatology input and we were  expecting the start of  meds . he said that he preferred to see her in  his office.  So, Rheumatology input could not be obtained at this time,  which was the plan, and seems to be critical for the management.  I also  spoke to the primary physician, Dr. Donia Hall, who has kindly  accepted to see the patient and the scheduled appointment is on the 27th  of February at 2:30 p.m.  The patient is aware of the her disease as  well as the need for the followup with the physician and probably a  rheumatologist and all of that was discussed and explained to her  through an interpreter over the phone and personal interpreter when she  was given the appointment.   DISCHARGE MEDICATIONS:  1. Prednisone 40 mg p.o. daily.  2. Hydrocortisone 2.5 mg cream topical b.i.d.   DISCHARGE DIAGNOSES:  Systemic lupus erythematosus.   DISCHARGE PLAN:  As discussed above, patient should follow up with the  primary physician and hopefully with  the rheumatologist for long term  management of her condition.      Beckey Rutter, MD  Electronically Signed     EME/MEDQ  D:  08/21/2006  T:  08/21/2006  Job:  [id number]

## 2010-11-11 NOTE — Op Note (Signed)
NAMEAMARIONNA, ARCA              ACCOUNT NO.:  [id number]   MEDICAL RECORD NO.:  [id number]          PATIENT TYPE:  INP   LOCATION:  A412                          FACILITY:  [facility]   PHYSICIAN:  Tilda Burrow, M.D. DATE OF BIRTH:  1986-02-28   DATE OF PROCEDURE:  07/18/2004  DATE OF DISCHARGE:                                 OPERATIVE REPORT   Preoperative decision at 1940 p.m., July 18, 2004.   Called to assess progress on this gravida 3, para 2, sent over earlier today  for medically indicated induction of labor at [redacted] weeks gestation with  advanced cervical favorability.  Cervix 3 cm, 90%, -1 to -2 station on  admission.  She progressed to completely dilated but upon pushing at  approximately 7 p.m., began to develop significant variable decelerations.  _with  the contraction with eventual return to baseline.  Macrosomia  suspected.  The patient was monitored through approximately 30 minutes of  active pushing with optimal McRoberts position of the legs, efforts at in-  and-out catheterization of the bladder which were unsuccessful and it was  obvious that no progress toward delivery was going to be made.  Cesarean  section decided upon.  Estimated suspected fetal macrosomia is cause of  cephalopelvic disproportion.      JVF/MEDQ  D:  07/18/2004  T:  07/19/2004  Job:  [id number]

## 2010-11-11 NOTE — Discharge Summary (Signed)
NAMESAMIKSHA, PELLICANO              ACCOUNT NO.:  [id number]   MEDICAL RECORD NO.:  [id number]          PATIENT TYPE:  INP   LOCATION:  A412                          FACILITY:  [facility]   PHYSICIAN:  Tilda Burrow, M.D. DATE OF BIRTH:  1986/05/12   DATE OF ADMISSION:  07/18/2004  DATE OF DISCHARGE:  01/26/2006LH                                 DISCHARGE SUMMARY   ADMISSION DIAGNOSES:  1.  Pregnancy at [redacted] weeks gestation.  2.  Latent phase labor.  3.  Pending post dates.   DISCHARGE DIAGNOSES:  1.  Pregnancy at 41 weeks, delivered.  2.  Cephalopelvic disproportion secondary to occiput posterior presentation      and large for gestational age infant.  3.  Anemia secondary to intraoperative blood loss.   DISCHARGE MEDICATIONS:  1.  Tylox one q.4h. p.r.n. pain.  2.  Motrin 800 mg q.8h. p.r.n. mild pain.  3.  Prenatal vitamins one p.o. daily.  4.  Iron sulfate 325 mg p.o. b.i.d. x30 days.   FOLLOW UP:  Followup will be in four days for staple removal.   HOSPITAL COURSE:  This 25 year old female gravida 3, para 2 followed with  limited prenatal care x10 weeks at the [facility] clinics presented to  [facility] over the weekend.  She requested delivery here at [facility].  She was seen in my office on July 18, 2004 and found to be 3 cm  dilated.  She was sent to labor and delivery where prodromal labor was  identified.  She was admitted for labor management.  See HPI for details.   The patient was admitted at midday and allowed to labor, reaching completely  dilated and pushing for approximately an hour.  There was absolute arrest of  labor at +1 to +2 station, with a significantly molded vertex despite  optimal contraction intensity and patient pushing.  There was no thrust of  the vertex.  The patient was therefore taken to cesarean section, delivering  a 9.5 pound female infant with Apgars of 9 and 9.  The vertex was in an  occiput posterior presentation.  There  was a significant amount of molding  of the vertex.  The infant was a very thick, stocky-bodied female infant who  would not have faired well for operative vaginal delivery.   There was 1200 cc blood loss at delivery related to lower uterine segment  extension of the incision in the right uterine vein.   Postoperatively, the patient had a hemoglobin of 8.4, hematocrit 24.3  compared to admission hemoglobin of 11.7 and hematocrit 33.2.  Blood gas on  the infant revealed a pH of 7.240, PCO2 60, PO2 11, with bicarb of 24.8.  Maternal blood type was B positive.  The patient was hemodynamically stable  during her recovery time and was stable for discharge on postoperative day  #3.   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  Mother is  comfortable with baby care with this.  Follow up will be in four days in our  office and then four weeks.      JVF/MEDQ  D:  07/21/2004  T:  07/21/2004  Job:  [id number]   cc:   [facility]   Jeoffrey Massed, MD  [address]  [place]  [place] [address]  Fax: [phone]   [facility]

## 2010-11-11 NOTE — H&P (Signed)
Donna Hall, Donna Hall       ACCOUNT NO.:  [id number]   MEDICAL RECORD NO.:  [id number]          PATIENT TYPE:  INP   LOCATION:  5125                         FACILITY:  [facility]   PHYSICIAN:  Gardiner Barefoot, MD    DATE OF BIRTH:  Apr 23, 1986   DATE OF ADMISSION:  08/14/2006  DATE OF DISCHARGE:                              HISTORY & PHYSICAL   PRIMARY CARE PHYSICIAN:  Dr. Reche Dixon   CHIEF COMPLAINT:  Rash and joint aches.   HISTORY OF PRESENT ILLNESS:  Donna Hall is a 25 year old Hispanic  female with a history of rash, joint swelling, myalgias and low-grade  fever that she has had essentially for the last six months.  She has  been in-and-out of the emergency room with no relief and has been on low-  dose prednisone most recently.  The patient has been evaluated by Dr.  Reche Dixon and has been found to have labs consistent with lupus and  actually had a biopsy of one of the lesions today.  Patient also has a  history of a miscarriage about ten months ago as well.  Patient  otherwise has no family history of any rheumatologic disorders.  The  patient presents as a direct admission from Dr. Benjaman Lobe office for  further IV therapy for lupus.  Otherwise, the patient has no nausea,  vomiting, has a normal p.o. and no diarrhea.  Patient also denies any  chest pain, or shortness of breath.   PAST MEDICAL HISTORY:  None.   MEDICINES:  Only recently on 10 mg of prednisone.   ALLERGIES:  No known drug allergies.   SOCIAL HISTORY:  Patient lives with her husband and 74-year-old daughter.   FAMILY HISTORY:  No history of rheumatologic diseases only diabetes.   REVIEW OF SYSTEMS:  Complete 12-point review of systems was obtained and  was negative other than those presented in the History of Present  Illness.   PHYSICAL EXAMINATION:  GENERAL:  Patient is awake, alert and oriented x3  and appears in mild distress  HEENT:  Anicteric, no thrush, no lymphadenopathy.  CARDIOVASCULAR:   Regular rate and rhythm, no murmurs, rubs or gallops.  LUNGS:  Clear to auscultation bilaterally.  ABDOMEN:  Soft, nontender, nondistended, positive bowel sounds, no  hepatosplenomegaly.  MUSCULOSKELETAL:  Notable effusion of her right and left knees and  multiple tender joints of her hands and elbow.  SKIN:  Diffuse papular rash throughout her body.  Right elbow with  psoriatic-type lesion.  Pruritic.   LABS:  No laboratory data sent with the patient.   Recent labs show an ESR of 88 as well as an increased rheumatoid factor.  Otherwise, normal creatinine and UA.   ASSESSMENT/PLAN:  Systemic lupus erythematosus.  The patient appears to  have systemic lupus erythematosus based on the reports of positive labs  done by primary care physician.  Therefore, we will try to obtain the  lab data from Dr. Reche Dixon in the a.m. and also have the patient seen by  rheumatology.  Also will follow up on the biopsy she had done in the  office there today as well.  In the meantime,  will treat the patient  with 60 mg of Solu-Medrol every six hours and as well as pain medicine  as needed.      Gardiner Barefoot, MD  Electronically Signed     RWC/MEDQ  D:  08/14/2006  T:  08/15/2006  Job:  [id number]

## 2010-11-11 NOTE — Op Note (Signed)
[name], [name]              ACCOUNT NO.:  [id number]   MEDICAL RECORD NO.:  [id number]          PATIENT TYPE:  AMB   LOCATION:  DFTL                          FACILITY:  [facility]   PHYSICIAN:  Guy Sandifer. Henderson Cloud, M.D. DATE OF BIRTH:  1985-10-31   DATE OF PROCEDURE:  11/14/2005  DATE OF DISCHARGE:                                 OPERATIVE REPORT   PREOPERATIVE DIAGNOSIS:  Inevitable abortion.   POSTOPERATIVE DIAGNOSIS:  Inevitable abortion.   PROCEDURE:  1.  Dilatation and evacuation.  2.  1% Xylocaine paracervical block.   SURGEON:  Guy Sandifer. Henderson Cloud, M.D.   ANESTHESIA:  MAC.   SPECIMENS:  Products of conception.   INDICATIONS:  The patient is a 25 year old Hispanic female G4, P3 with an  unknown last menstrual period who was evaluated last evening at [facility] with complaints of cramping and bleeding,  An intrauterine  pregnancy with a crown-rump length of 11 weeks and no fetal heartbeat was  noted low in the endometrial canal.  The patient complains of intermittent  temperature elevation with some fever and chills.  Examination in triage  revealed the cervix to be fingertip dilated.  Her initial temperature is  100.3 but she became afebrile upon further observation.  Using an  interpreter, the above information was discussed.  In view of the cervical  dilation and temperature elevation, dilatation and evacuation was  recommended.  The potential risks and complications were discussed  preoperatively including but not limited to infection, uterine perforation,  organ damage, bleeding requiring transfusion of blood products with possible  transfusion, reaction HIV and hepatitis acquisition, laparotomy,  hysterectomy and intrauterine synechia bleeding and secondary infertility.  All questions were answered.   DESCRIPTION OF PROCEDURE:  The patient is given IV Ancef intravenously  preoperatively.  Repeat labs today revealed a white count of 4.0 and a  hemoglobin of  12.8.  Blood type was B+.  The patient is taken to the  operating room where she is identified, placed in dorsal supine position and  given intravenous anesthetic.  She was then placed in the dorsal lithotomy  position where she is prepped, bladder is straight catheterized and she is  draped in sterile fashion.  Bivalve speculum was placed in the vagina and  anterior cervical lip was injected with 1% Xylocaine and grasped with a  single-tooth tenaculum.  Paracervical block was then placed in the 2, 4, 5,  7, 8 and 10 o'clock positions with approximately 20 mL total of 1% plain  Xylocaine.  The cervix was easily and gently dilated to a 31 Pratt dilator.  A #10 curved suction curette was then placed in the intrauterine cavity and  suction curettage is carried out for obvious products of conception.  After  the initial pass of the suction curette, 10 units of Pitocin were added to  the remaining 500 mL of IV fluids.  Suction curettage is carried out and  alternated with sharp curettage, and the cavity is clean.  Hemostasis is  noted.  All instruments are removed.  The patient is taken to the recovery  room  in  stable condition.  All counts were correct.  Blood type was B+.  She will be  discharged home on Methergine 0.2 mg #6 one p.o. t.i.d., no refills, Ceftin  500 mg #10 one p.o. b.i.d., no refills and Darvocet N 100 #20, one to two  p.o. q.6h. p.r.n., no refills.  Followup is in the office in 2 weeks.      Guy Sandifer Henderson Cloud, M.D.  Electronically Signed     JET/MEDQ  D:  11/14/2005  T:  11/15/2005  Job:  [id number]

## 2010-11-11 NOTE — Op Note (Signed)
NAMEEVAH, Donna Hall              ACCOUNT NO.:  [id number]   MEDICAL RECORD NO.:  [id number]          PATIENT TYPE:  INP   LOCATION:  A412                          FACILITY:  [facility]   PHYSICIAN:  Tilda Burrow, M.D. DATE OF BIRTH:  17-Jun-1986   DATE OF PROCEDURE:  07/18/2004  DATE OF DISCHARGE:                                 OPERATIVE REPORT   PREOPERATIVE DIAGNOSIS:  1.  Pregnancy [redacted] weeks gestation.  2.  Cephalopelvic disproportion.   POSTOPERATIVE DIAGNOSES:  1.  Pregnancy [redacted] weeks gestation.  2.  Cephalopelvic disproportion.   OPERATION/PROCEDURE:  Primary low transverse cervical cesarean section.   SURGEON:  Tilda Burrow, M.D.   ASSISTANTHart Rochester, C.N.N.   ANESTHESIA:  Spinal by Bufford Lope, C.R.N.A.   COMPLICATIONS:  Extensive bleeding due to extensive lower segment incision  into the right uterine vein, repaired intraoperatively.   FINDINGS:  Healthy female infant with Apgars 9 and 9, significantly molded  vertex, very thick, short, stocky body.  This was not a baby to be delivered  vaginally.   DESCRIPTION OF PROCEDURE:  The patient was taken to the operating room.  Spinal anesthesia was introduced.  Abdomen prepped and draped.  A Foley  catheter was already in place.   A Pfannenstiel-type incision was performed easily, bladder flap developed  easily and very thinned out lower urine segment opened through a transverse  incision, being careful to avoid the fetal vertex.  Fetal vertex was rotated  from occiput posterior presentation into the incision and vacuum assistance  was utilized, combined with fundal pressure.  Vacuum assistance did not seal  well due to the degree of molding and the extensive amount of hair on the  baby's head.  The majority of the progress was done by using fundal  pressure.  Once the head was delivered, bulb suctioning could be performed  and the right arm gently eased out.  The left axilla could be identified  posteriorly and  was used as traction site.  No pulling on the head occurred.  The infant was delivered with quite thick, stocky body.  Cord was clamped.  Infant was placed under the care of Dr. Marvel Plan whose notes are dictated  elsewhere.  Placenta was delivered.  Shultz presentation and sent for  pathology confirmation.  There was only a tiny bit of meconium discoloration  of the membranes.   The uterus was irrigated with antibiotic solution closed in a running,  locking fashion.  It was noted that there was bleeding from the right  uterine vein which required figure-of-eight suture for hemostasis.  Closure  of the uterus was easily achieved.  Bladder flap was reapproximated with 2-0  chromic.  Irrigation of the abdominal cavity was then performed and we  identified an additional 500 mL of solid clots in the right pelvic gutter.  These were irrigated out and hemostasis confirmed again.  The anterior  peritoneum was closed with continuous running 2-0 chromic, the fascia closed  with 0 Vicryl, subcutaneous tissue approximated with continuous running 2-0  chromic and staple closure of the skin completing the procedure.  The  patient tolerated the procedure well and went to the recovery room in good  condition with good analgesic effect.  Sponge and needle counts were  correct.  Blood type is confirmed and is B positive by old records as well  as reassessment upon admission.      JVF/MEDQ  D:  07/18/2004  T:  07/19/2004  Job:  [id number]   cc:   Jeoffrey Massed, MD  [address]  [place]  [place] [address]  Fax: [phone]   [facility]

## 2010-12-01 DIAGNOSIS — L219 Seborrheic dermatitis, unspecified: Secondary | ICD-10-CM | POA: Insufficient documentation

## 2011-03-21 LAB — AFB CULTURE WITH SMEAR (NOT AT ARMC)

## 2011-03-21 LAB — CBC
HCT: 28 — ABNORMAL LOW
HCT: 28.3 — ABNORMAL LOW
HCT: 29.1 — ABNORMAL LOW
HCT: 29.3 — ABNORMAL LOW
HCT: 29.3 — ABNORMAL LOW
Hemoglobin: 10 — ABNORMAL LOW
Hemoglobin: 9.7 — ABNORMAL LOW
MCHC: 33.3
MCHC: 33.3
MCHC: 33.4
MCHC: 35
MCV: 80.8
MCV: 81
MCV: 81.8
MCV: 82.2
MCV: 82.3
Platelets: 280
Platelets: 295
Platelets: 315
Platelets: 343
Platelets: 350
RBC: 3.45 — ABNORMAL LOW
RBC: 3.56 — ABNORMAL LOW
RBC: 3.6 — ABNORMAL LOW
RBC: 3.64 — ABNORMAL LOW
RDW: 13.1
RDW: 13.4
RDW: 13.4
WBC: 2 — ABNORMAL LOW
WBC: 2.3 — ABNORMAL LOW
WBC: 5
WBC: 5
WBC: 5
WBC: 5.4

## 2011-03-21 LAB — TSH: TSH: 1.429

## 2011-03-21 LAB — DIFFERENTIAL
Basophils Absolute: 0
Basophils Absolute: 0
Basophils Absolute: 0
Basophils Relative: 0
Basophils Relative: 0
Eosinophils Absolute: 0
Eosinophils Absolute: 0
Eosinophils Absolute: 0.1
Eosinophils Relative: 0
Eosinophils Relative: 1
Lymphocytes Relative: 14
Lymphocytes Relative: 15
Lymphocytes Relative: 9 — ABNORMAL LOW
Lymphs Abs: 0.2 — ABNORMAL LOW
Lymphs Abs: 0.4 — ABNORMAL LOW
Lymphs Abs: 0.8
Monocytes Absolute: 0.3
Monocytes Absolute: 0.3
Monocytes Relative: 3
Monocytes Relative: 4
Monocytes Relative: 5
Neutro Abs: 1.7
Neutro Abs: 4.3
Neutrophils Relative %: 82 — ABNORMAL HIGH
Neutrophils Relative %: 85 — ABNORMAL HIGH
Neutrophils Relative %: 85 — ABNORMAL HIGH

## 2011-03-21 LAB — POCT I-STAT 3, ART BLOOD GAS (G3+)
O2 Saturation: 99
Patient temperature: 37
TCO2: 26

## 2011-03-21 LAB — BASIC METABOLIC PANEL
BUN: 11
BUN: 4 — ABNORMAL LOW
BUN: 7
CO2: 31
CO2: 32
Calcium: 7.9 — ABNORMAL LOW
Chloride: 103
Chloride: 105
Chloride: 107
Creatinine, Ser: 0.46
GFR calc Af Amer: >60
GFR calc non Af Amer: >60
GFR calc non Af Amer: >60
Glucose, Bld: 125 — ABNORMAL HIGH
Glucose, Bld: 95
Potassium: 3.4 — ABNORMAL LOW
Potassium: 3.8
Sodium: 137
Sodium: 139

## 2011-03-21 LAB — CULTURE, BLOOD (ROUTINE X 2)

## 2011-03-21 LAB — ANTI-NUCLEAR AB-TITER (ANA TITER): ANA Titer 1: 1:640 {titer} — ABNORMAL HIGH

## 2011-03-21 LAB — POCT I-STAT, CHEM 8
Calcium, Ion: 1.08 — ABNORMAL LOW
Chloride: 100
HCT: 31 — ABNORMAL LOW
Sodium: 137
TCO2: 23

## 2011-03-21 LAB — CARDIAC PANEL(CRET KIN+CKTOT+MB+TROPI)
CK, MB: 1.1
CK, MB: 1.5
Total CK: 57
Total CK: 81
Troponin I: <0.01

## 2011-03-21 LAB — FUNGUS CULTURE W SMEAR: Fungal Smear: NONE SEEN

## 2011-03-21 LAB — HEPATIC FUNCTION PANEL
Albumin: 2.4 — ABNORMAL LOW
Indirect Bilirubin: 0.3
Total Protein: 7.1

## 2011-03-21 LAB — COMPREHENSIVE METABOLIC PANEL
AST: 50 — ABNORMAL HIGH
Albumin: 2.3 — ABNORMAL LOW
Alkaline Phosphatase: 63
BUN: 7
BUN: 8
CO2: 26
Calcium: 8.1 — ABNORMAL LOW
Chloride: 107
Chloride: 109
Creatinine, Ser: 0.42
Creatinine, Ser: 0.53
GFR calc Af Amer: >60
GFR calc non Af Amer: >60
GFR calc non Af Amer: >60
Glucose, Bld: 127 — ABNORMAL HIGH
Total Bilirubin: 0.4
Total Bilirubin: 0.4

## 2011-03-21 LAB — C3 COMPLEMENT: C3 Complement: 67 — ABNORMAL LOW

## 2011-03-21 LAB — RETICULOCYTES
RBC.: 3.86 — ABNORMAL LOW
Retic Count, Absolute: 34.7
Retic Ct Pct: 0.9

## 2011-03-21 LAB — STREP PNEUMONIAE URINARY ANTIGEN: Strep Pneumo Urinary Antigen: NEGATIVE

## 2011-03-21 LAB — HIV ANTIBODY (ROUTINE TESTING W REFLEX): HIV: NONREACTIVE

## 2011-03-21 LAB — AMYLASE: Amylase: 32

## 2012-03-07 ENCOUNTER — Inpatient Hospital Stay (HOSPITAL_COMMUNITY): Payer: Medicaid Other

## 2012-03-07 ENCOUNTER — Inpatient Hospital Stay (HOSPITAL_COMMUNITY)
Admission: EM | Admit: 2012-03-07 | Discharge: 2012-03-13 | DRG: 546 | Disposition: A | Discharge status: Home / Self Care | Payer: Medicaid Other | Attending: Internal Medicine | Admitting: Internal Medicine

## 2012-03-07 ENCOUNTER — Encounter (HOSPITAL_COMMUNITY): Payer: Self-pay | Admitting: Emergency Medicine

## 2012-03-07 ENCOUNTER — Emergency Department (HOSPITAL_COMMUNITY): Payer: Medicaid Other

## 2012-03-07 DIAGNOSIS — R0602 Shortness of breath: Secondary | ICD-10-CM | POA: Diagnosis present

## 2012-03-07 DIAGNOSIS — Z91199 Patient's noncompliance with other medical treatment and regimen due to unspecified reason: Secondary | ICD-10-CM

## 2012-03-07 DIAGNOSIS — J8410 Pulmonary fibrosis, unspecified: Secondary | ICD-10-CM | POA: Diagnosis present

## 2012-03-07 DIAGNOSIS — M659 Unspecified synovitis and tenosynovitis, unspecified site: Secondary | ICD-10-CM | POA: Diagnosis present

## 2012-03-07 DIAGNOSIS — Z6841 Body Mass Index (BMI) 40.0 and over, adult: Secondary | ICD-10-CM

## 2012-03-07 DIAGNOSIS — IMO0002 Reserved for concepts with insufficient information to code with codable children: Secondary | ICD-10-CM

## 2012-03-07 DIAGNOSIS — R000 Tachycardia, unspecified: Secondary | ICD-10-CM | POA: Diagnosis present

## 2012-03-07 DIAGNOSIS — M069 Rheumatoid arthritis, unspecified: Principal | ICD-10-CM | POA: Diagnosis present

## 2012-03-07 DIAGNOSIS — L409 Psoriasis, unspecified: Secondary | ICD-10-CM | POA: Diagnosis present

## 2012-03-07 DIAGNOSIS — L408 Other psoriasis: Secondary | ICD-10-CM | POA: Diagnosis present

## 2012-03-07 DIAGNOSIS — Z9119 Patient's noncompliance with other medical treatment and regimen: Secondary | ICD-10-CM

## 2012-03-07 DIAGNOSIS — Z79899 Other long term (current) drug therapy: Secondary | ICD-10-CM

## 2012-03-07 DIAGNOSIS — Z7952 Long term (current) use of systemic steroids: Secondary | ICD-10-CM

## 2012-03-07 DIAGNOSIS — E669 Obesity, unspecified: Secondary | ICD-10-CM | POA: Diagnosis present

## 2012-03-07 DIAGNOSIS — E662 Morbid (severe) obesity with alveolar hypoventilation: Secondary | ICD-10-CM | POA: Diagnosis present

## 2012-03-07 DIAGNOSIS — G894 Chronic pain syndrome: Secondary | ICD-10-CM | POA: Diagnosis present

## 2012-03-07 DIAGNOSIS — I10 Essential (primary) hypertension: Secondary | ICD-10-CM | POA: Diagnosis present

## 2012-03-07 DIAGNOSIS — L83 Acanthosis nigricans: Secondary | ICD-10-CM | POA: Diagnosis present

## 2012-03-07 DIAGNOSIS — M329 Systemic lupus erythematosus, unspecified: Secondary | ICD-10-CM | POA: Diagnosis present

## 2012-03-07 DIAGNOSIS — Z23 Encounter for immunization: Secondary | ICD-10-CM

## 2012-03-07 DIAGNOSIS — G4733 Obstructive sleep apnea (adult) (pediatric): Secondary | ICD-10-CM | POA: Diagnosis present

## 2012-03-07 HISTORY — DX: Psoriasis, unspecified: L40.9

## 2012-03-07 HISTORY — DX: Essential (primary) hypertension: I10

## 2012-03-07 HISTORY — DX: Gestational diabetes mellitus in pregnancy, unspecified control: O24.419

## 2012-03-07 HISTORY — DX: Systemic lupus erythematosus, unspecified: M32.9

## 2012-03-07 LAB — CBC WITH DIFFERENTIAL/PLATELET
Eosinophils Absolute: 0.1 10*3/uL (ref 0.0–0.7)
Lymphs Abs: 3.6 10*3/uL (ref 0.7–4.0)
MCH: 28.3 pg (ref 26.0–34.0)
Neutrophils Relative %: 68 % (ref 43–77)
Platelets: 392 10*3/uL (ref 150–400)
RBC: 4.7 MIL/uL (ref 3.87–5.11)
WBC: 14.1 10*3/uL — ABNORMAL HIGH (ref 4.0–10.5)

## 2012-03-07 LAB — COMPREHENSIVE METABOLIC PANEL
ALT: 17 U/L (ref 0–35)
Albumin: 3.8 g/dL (ref 3.5–5.2)
Alkaline Phosphatase: 74 U/L (ref 39–117)
Glucose, Bld: 92 mg/dL (ref 70–99)
Potassium: 3.7 mEq/L (ref 3.5–5.1)
Sodium: 140 mEq/L (ref 135–145)
Total Protein: 8.1 g/dL (ref 6.0–8.3)

## 2012-03-07 LAB — TROPONIN I: Troponin I: <0.3 ng/mL (ref <0.30)

## 2012-03-07 LAB — CK TOTAL AND CKMB (NOT AT ARMC)
CK, MB: 2.3 ng/mL (ref 0.3–4.0)
Total CK: 67 U/L (ref 7–177)

## 2012-03-07 LAB — C3 COMPLEMENT: C3 Complement: 128 mg/dL (ref 90–180)

## 2012-03-07 LAB — C4 COMPLEMENT: Complement C4, Body Fluid: 26 mg/dL (ref 10–40)

## 2012-03-07 LAB — TSH: TSH: 1.002 u[IU]/mL (ref 0.350–4.500)

## 2012-03-07 MED ORDER — SODIUM CHLORIDE 0.9 % IV SOLN
INTRAVENOUS | Status: DC
  Administered 2012-03-07 – 2012-03-08 (×2): via INTRAVENOUS
  Administered 2012-03-08: 100 mL/h via INTRAVENOUS
  Administered 2012-03-09: 14:00:00 via INTRAVENOUS
  Administered 2012-03-09: 100 mL/h via INTRAVENOUS
  Administered 2012-03-10: 11:00:00 via INTRAVENOUS

## 2012-03-07 MED ORDER — HYDROMORPHONE HCL PF 1 MG/ML IJ SOLN
1.0000 mg | INTRAMUSCULAR | Status: DC | PRN
  Administered 2012-03-07 (×2): 1 mg via INTRAVENOUS
  Filled 2012-03-07 (×2): qty 1

## 2012-03-07 MED ORDER — KETOROLAC TROMETHAMINE 30 MG/ML IJ SOLN
30.0000 mg | Freq: Once | INTRAMUSCULAR | Status: AC
  Administered 2012-03-07: 30 mg via INTRAVENOUS
  Filled 2012-03-07: qty 1

## 2012-03-07 MED ORDER — LORAZEPAM 1 MG PO TABS
1.0000 mg | ORAL_TABLET | Freq: Once | ORAL | Status: AC
  Administered 2012-03-07: 1 mg via ORAL
  Filled 2012-03-07: qty 1

## 2012-03-07 MED ORDER — METHYLPREDNISOLONE SODIUM SUCC 125 MG IJ SOLR
125.0000 mg | Freq: Once | INTRAMUSCULAR | Status: AC
  Administered 2012-03-07: 125 mg via INTRAVENOUS
  Filled 2012-03-07: qty 2

## 2012-03-07 MED ORDER — MORPHINE SULFATE 2 MG/ML IJ SOLN
2.0000 mg | INTRAMUSCULAR | Status: DC | PRN
  Administered 2012-03-07 – 2012-03-09 (×6): 2 mg via INTRAVENOUS
  Filled 2012-03-07 (×7): qty 1

## 2012-03-07 MED ORDER — ENOXAPARIN SODIUM 40 MG/0.4ML ~~LOC~~ SOLN
40.0000 mg | SUBCUTANEOUS | Status: DC
  Administered 2012-03-07 – 2012-03-13 (×7): 40 mg via SUBCUTANEOUS
  Filled 2012-03-07 (×7): qty 0.4

## 2012-03-07 MED ORDER — DOCUSATE SODIUM 100 MG PO CAPS
100.0000 mg | ORAL_CAPSULE | Freq: Two times a day (BID) | ORAL | Status: DC
  Administered 2012-03-07 – 2012-03-13 (×13): 100 mg via ORAL
  Filled 2012-03-07 (×15): qty 1

## 2012-03-07 MED ORDER — SULFAMETHOXAZOLE-TMP DS 800-160 MG PO TABS
1.0000 | ORAL_TABLET | ORAL | Status: DC
  Administered 2012-03-08 – 2012-03-13 (×3): 1 via ORAL
  Filled 2012-03-07 (×3): qty 1

## 2012-03-07 MED ORDER — PANTOPRAZOLE SODIUM 40 MG PO TBEC
40.0000 mg | DELAYED_RELEASE_TABLET | Freq: Every day | ORAL | Status: DC
  Administered 2012-03-07 – 2012-03-13 (×7): 40 mg via ORAL
  Filled 2012-03-07 (×7): qty 1

## 2012-03-07 MED ORDER — ACETAMINOPHEN 500 MG PO TABS
1000.0000 mg | ORAL_TABLET | Freq: Four times a day (QID) | ORAL | Status: DC
  Administered 2012-03-07 – 2012-03-09 (×6): 1000 mg via ORAL
  Filled 2012-03-07 (×11): qty 2

## 2012-03-07 MED ORDER — SULFAMETHOXAZOLE-TRIMETHOPRIM 800-160 MG PO TABS: 1.0000 | ORAL_TABLET | ORAL | Status: DC

## 2012-03-07 MED ORDER — IOHEXOL 350 MG/ML SOLN
90.0000 mL | Freq: Once | INTRAVENOUS | Status: AC | PRN
  Administered 2012-03-07: 90 mL via INTRAVENOUS

## 2012-03-07 MED ORDER — HYDROXYZINE HCL 25 MG PO TABS
25.0000 mg | ORAL_TABLET | Freq: Three times a day (TID) | ORAL | Status: DC | PRN
  Filled 2012-03-07: qty 1

## 2012-03-07 MED ORDER — ONDANSETRON HCL 4 MG/2ML IJ SOLN: 4.0000 mg | Freq: Four times a day (QID) | INTRAMUSCULAR | Status: DC | PRN

## 2012-03-07 MED ORDER — HYDROXYCHLOROQUINE SULFATE 200 MG PO TABS
200.0000 mg | ORAL_TABLET | Freq: Every day | ORAL | Status: DC
  Administered 2012-03-07 – 2012-03-13 (×7): 200 mg via ORAL
  Filled 2012-03-07 (×7): qty 1

## 2012-03-07 MED ORDER — SODIUM CHLORIDE 0.9 % IJ SOLN
3.0000 mL | Freq: Two times a day (BID) | INTRAMUSCULAR | Status: DC
  Administered 2012-03-07 – 2012-03-12 (×8): 3 mL via INTRAVENOUS

## 2012-03-07 MED ORDER — CYCLOBENZAPRINE HCL 10 MG PO TABS
10.0000 mg | ORAL_TABLET | Freq: Two times a day (BID) | ORAL | Status: DC
  Administered 2012-03-07 – 2012-03-13 (×13): 10 mg via ORAL
  Filled 2012-03-07 (×18): qty 1

## 2012-03-07 MED ORDER — CYCLOBENZAPRINE HCL 10 MG PO TABS
5.0000 mg | ORAL_TABLET | Freq: Once | ORAL | Status: AC
  Administered 2012-03-07: 5 mg via ORAL
  Filled 2012-03-07: qty 1

## 2012-03-07 MED ORDER — KETOROLAC TROMETHAMINE 30 MG/ML IJ SOLN
30.0000 mg | Freq: Four times a day (QID) | INTRAMUSCULAR | Status: DC
  Administered 2012-03-07 – 2012-03-10 (×12): 30 mg via INTRAVENOUS
  Filled 2012-03-07 (×15): qty 1

## 2012-03-07 MED ORDER — ONDANSETRON HCL 4 MG PO TABS: 4.0000 mg | ORAL_TABLET | Freq: Four times a day (QID) | ORAL | Status: DC | PRN

## 2012-03-07 MED ORDER — AZATHIOPRINE 50 MG PO TABS
50.0000 mg | ORAL_TABLET | Freq: Every day | ORAL | Status: DC
  Administered 2012-03-07 – 2012-03-13 (×7): 50 mg via ORAL
  Filled 2012-03-07 (×7): qty 1

## 2012-03-07 NOTE — ED Provider Notes (Signed)
History     CSN: [id number] Arrival date & time 03/07/12  1191  First MD Initiated Contact with Patient 03/07/12 [phone]     Chief Complaint  Patient presents with  . Generalized Body Aches   The history is provided by the patient. The history is limited by a language barrier. A language interpreter was used.   patient presents to the emergency room with complaints of severe unrelenting diffuse body ache and joint aches. Patient states the symptoms started yesterday. She denies any fever or vomiting.  She has history of lupus and rheumatoid arthritis but has not had symptoms this severe before. She has not noticed any rash. She denies any chest pain or abdominal pain.    Past Medical History  Diagnosis Date  . Lupus (systemic lupus erythematosus)   . Psoriasis     History reviewed. No pertinent past surgical history.  No family history on file.  History  Substance Use Topics  . Smoking status: Never Smoker   . Smokeless tobacco: Not on file  . Alcohol Use: No    OB History    Grav Para Term Preterm Abortions TAB SAB Ect Mult Living                  Review of Systems  All other systems reviewed and are negative.    Allergies  Review of patient's allergies indicates not on file.  Home Medications  No current outpatient prescriptions on file.  BP 144/94  Pulse 127  Temp 98.7 F (37.1 C) (Oral)  Resp 24  SpO2 97%  Physical Exam  Nursing note and vitals reviewed. Constitutional: She appears distressed.       Morbidly obese  HENT:  Head: Normocephalic and atraumatic.  Right Ear: External ear normal.  Left Ear: External ear normal.  Eyes: Conjunctivae normal are normal. Right eye exhibits no discharge. Left eye exhibits no discharge. No scleral icterus.  Neck: Neck supple. No tracheal deviation present.  Cardiovascular: Normal rate, regular rhythm and intact distal pulses.   Pulmonary/Chest: Effort normal and breath sounds normal. No stridor. No respiratory  distress. She has no wheezes. She has no rales.  Abdominal: Soft. Bowel sounds are normal. She exhibits no distension. There is no tenderness. There is no rebound and no guarding.  Musculoskeletal: She exhibits tenderness. She exhibits no edema.       No gross erythema or effusions, diffuse tenderness in all extremities  Neurological: She is alert. She has normal strength. No sensory deficit. Cranial nerve deficit:  no gross defecits noted. She exhibits normal muscle tone. She displays no seizure activity. Coordination normal.  Skin: Skin is warm and dry. No rash noted.  Psychiatric: She has a normal mood and affect.    ED Course  Procedures (including critical care time)  Labs Reviewed  CBC WITH DIFFERENTIAL - Abnormal; Notable for the following:    WBC 14.1 (*)     Neutro Abs 9.6 (*)     All other components within normal limits  COMPREHENSIVE METABOLIC PANEL - Abnormal; Notable for the following:    Total Bilirubin 0.2 (*)     GFR calc non Af Amer 88 (*)     All other components within normal limits  URINALYSIS, ROUTINE W REFLEX MICROSCOPIC  SEDIMENTATION RATE   Dg Chest 2 View  03/07/2012  *RADIOLOGY REPORT*  Clinical Data: Generalized body aches and fever for 3 days.  CHEST - 2 VIEW  Comparison: 09/29/2010  Findings: The shallow inspiration.  Borderline heart size and pulmonary vascularity similar previous study.  Suggestion of focal perihilar infiltration vertically on the left which is more prominent than on the prior study.  Changes may represent early pneumonia.  No blunting of costophrenic angles.  No pneumothorax. Mediastinal contours appear intact.  IMPRESSION: Borderline heart size and pulmonary vascularity similar to previous study and possibly related to shallow inspiration.  Left perihilar infiltration may represent early pneumonia.   Original Report Authenticated By: Marlon Pel, M.D.     MDM  Pt with myalgias and arthralgias.  Likely RA flare.  Pt without  complaints of cough.  Cannot exclude early PNA by CXR. Anticipate pt will need admission for pain management.        Celene Kras, MD 03/07/12 [phone]

## 2012-03-07 NOTE — ED Notes (Signed)
PT. REPORTS GENERALIZED BODY ACHES / JOINT PAINS ONSET YESTERDAY WITH HEADACHE , STATES HISTORY OF LUPUS, INTERPRETER ( SPANISH) PHONE USED AT TRIAGE.

## 2012-03-07 NOTE — ED Notes (Signed)
Patient relayed the message that she was to dizzy to stand.

## 2012-03-07 NOTE — ED Notes (Signed)
Patient returning from CT scan, patient medicated for pain, patient with 10/10 pain, patient resting more comfortably post pain medication

## 2012-03-07 NOTE — Consult Note (Signed)
Name: Donna Hall MRN: [id number] DOB: [birth year]    LOS: 0  PULMONARY / CRITICAL CARE MEDICINE   Requesting:  Teaching service  Reason for consult:  Suspected pneumonia  HPI -  26 yo with SLE on chronic prednisone/plaquenil/imuran, chronic pain syndrome and medical noncompliance admitted 9/12 with one week history of severe generalized pain, bilateral lower extremity edema, pleuritic chest pain, nausea, subjective fevers and dyspnea. CXR demonstrated L hilar infiltrate suspicious for pneumonia and chest CTA was negative for pulmonary embolism.  Past Medical History  Diagnosis Date  . Lupus (systemic lupus erythematosus)   . Psoriasis   . Gestational diabetes   . Hypertension    Medications Prior to Admission  Medication Sig Dispense Refill  . azaTHIOprine (IMURAN) 50 MG tablet Take by mouth daily.      . famotidine (PEPCID) 20 MG tablet Take 20 mg by mouth 2 (two) times daily.      . hydroxychloroquine (PLAQUENIL) 200 MG tablet Take 200 mg by mouth daily.      . hydrOXYzine (ATARAX/VISTARIL) 25 MG tablet Take 25 mg by mouth 3 (three) times daily as needed.      . predniSONE (DELTASONE) 50 MG tablet Take 50 mg by mouth daily.      Marland Kitchen sulfamethoxazole-trimethoprim (BACTRIM DS,SEPTRA DS) 800-160 MG per tablet Take 1 tablet by mouth every Monday, Wednesday, and Friday.       Review of patient's allergies indicates no known allergies.  History   Social History  . Marital Status: Single    Spouse Name: N/A    Number of Children: N/A  . Years of Education: N/A   Occupational History  . Not on file.   Social History Main Topics  . Smoking status: Never Smoker   . Smokeless tobacco: Not on file  . Alcohol Use: No  . Drug Use: No  . Sexually Active:    Other Topics Concern  . Not on file   Social History Narrative   Patient lives with husband and 2 children (age 8 and 8) in [place]. She migrated form [place] 10 years ago. She does not work. Has completed the  first grade. Cannot read and write.    Family Hx: Family History  Problem Relation Age of Onset  . Diabetes Mother   . Hypertension Mother    ROS: Per HPI.  C/o diffuse pain.  All other systems reviewed and were neg.   Filed Vitals:   03/07/12 0816 03/07/12 1000 03/07/12 1030 03/07/12 1400  BP: 116/72 119/70 118/71 127/78  Pulse: 109 97 101 84  Temp: 97.2 F (36.2 C)   98.1 F (36.7 C)  TempSrc: Oral     Resp: 20 16 15 22   SpO2: 97% 97% 98% 97%   Physical examination: Gen:  Obese female, no acute distress Neuro:  Sleepy, snores loudly, arouses to stimulation HEENT:  Crowded oropharynx, moist membranes Neck:  No JVD Heart:  Regular, no murmurs Chest:  Bilateral diminished air enry, no w/r/r Abdomen:  Obese, soft, bowel sounds present Extr:  Trace edema LE bilaterally Skin:  No rash  Labs:  Lab 03/07/12 1049 03/07/12 0551  HGB -- 13.3  WBC -- 14.1*  PLT -- 392  NA -- 140  K -- 3.7  CL -- 101  CO2 -- 24  GLUCOSE -- 92  BUN -- 19  CREATININE -- 0.89  CALCIUM -- 9.2  MG -- --  PHOS -- --  AST -- 29  ALT -- 17  ALKPHOS --  74  BILITOT -- 0.2*  PROT -- 8.1  ALBUMIN -- 3.8  INR -- --  APTT -- --  INR -- --  LATICACIDVEN -- --  TROPONINI <0.30 --  PHART -- --  PCO2ART -- --  PO2ART -- --  HCO3 -- --  O2SAT -- --   Imaging: Dg Chest 2 View  03/07/2012  *RADIOLOGY REPORT*  Clinical Data: Generalized body aches and fever for 3 days.  CHEST - 2 VIEW  Comparison: 09/29/2010  Findings: The shallow inspiration.  Borderline heart size and pulmonary vascularity similar previous study.  Suggestion of focal perihilar infiltration vertically on the left which is more prominent than on the prior study.  Changes may represent early pneumonia.  No blunting of costophrenic angles.  No pneumothorax. Mediastinal contours appear intact.  IMPRESSION: Borderline heart size and pulmonary vascularity similar to previous study and possibly related to shallow inspiration.  Left  perihilar infiltration may represent early pneumonia.   Original Report Authenticated By: Marlon Pel, M.D.    Ct Head Wo Contrast  03/07/2012  *RADIOLOGY REPORT*  Clinical Data: Generalized body aches  CT HEAD WITHOUT CONTRAST  Technique:  Contiguous axial images were obtained from the base of the skull through the vertex without contrast.  Comparison: Head CT 06/15/2006  Findings: No acute intracranial hemorrhage.  No focal mass lesion. No CT evidence of acute infarction.   No midline shift or mass effect.  No hydrocephalus.  Basilar cisterns are patent. No acute intracranial hemorrhage.  No focal mass lesion.  No CT evidence of acute infarction.   No midline shift or mass effect.  No hydrocephalus.  Basilar cisterns are patent.  IMPRESSION: No acute intracranial findings.   Original Report Authenticated By: Genevive Bi, M.D.    Ct Angio Chest Pe W/cm &/or Wo Cm  03/07/2012  *RADIOLOGY REPORT*  Clinical Data: Shortness of breath.  CT ANGIOGRAPHY CHEST  Technique:  Multidetector CT imaging of the chest using the standard protocol during bolus administration of intravenous contrast. Multiplanar reconstructed images including MIPs were obtained and reviewed to evaluate the vascular anatomy.  Contrast:  90 ml Omnipaque 300 IV.  Comparison: 10/09/2007  Findings:  No filling defects in the pulmonary arteries to suggest pulmonary emboli.  Heart is borderline in size.  Pulmonary vascular congestion.  Ground-glass opacities throughout the lungs could reflect mild interstitial edema or areas of inflammation/small airways disease.  No pleural effusions.  Previously seen mediastinal adenopathy much improved.  No pathologically enlarged mediastinal lymph nodes currently. Resolution of the previously seen pericardial effusion.  Visualized thyroid and chest wall soft tissues unremarkable. Imaging into the upper abdomen shows no acute findings.  IMPRESSION: No filling defects seen to suggest pulmonary embolus.   Very low lung volumes.  Cardiomegaly with vascular congestion and scattered patchy ground-glass opacities, edema versus small airways disease/alveolitis.   Original Report Authenticated By: Cyndie Chime, M.D.    IMPRESSION/ PLAN:  Chest imaging negative for pulmonary embolism and new parenchymal findings Chronic left perihilar density and GGOs Suspected lupus flare Pleuritic chest pain Possible lupus pneumonitis or lupus-associated ILD No overt evidence of pulmonary infection Obesity Suspected obstructive sleep apnea / obesity hypoventilation syndrome Chronic pain syndrome Medical noncompliance  -->  No clear indications for bronchoscopic evaluation -->  C3, C4, ESR, CRP, RF -->  Continue / reinstitute immunosuppressive therapy -->  Full PFT -->  Polysomnography as outpatient -->  Pain control -->  Will follow  WHITEHEART,KATHRYN, NP 03/07/2012  2:34 PM Pager: [phone]  [phone]  Patient examined.  Records reviewed.  Case discussed with NP.  Assessment and plan edited as above.  Orlean Bradford, M.D., F.C.C.P. Pulmonary and Critical Care Medicine [facility] Cell: [phone] Pager: [phone]

## 2012-03-07 NOTE — ED Notes (Signed)
Knapp MD at bedside  

## 2012-03-07 NOTE — ED Notes (Signed)
Pt speaks spanish and interpreter phones used

## 2012-03-07 NOTE — ED Notes (Signed)
Patient resting with eyes closed, easily arouseable, NAD at this time

## 2012-03-07 NOTE — ED Provider Notes (Addendum)
I sent here from Dr. Lynelle Doctor at 7 AM. Patient has a history of lupus is presenting with severe diffuse body aches. No fever. No meningismus. Low suspicion for meningitis. WBC 14, CXR similar to previous, no cough.  [facility] residents have seen the patient and would like to try further medications for pain control before admission.  9:29 AM. Patient's pain is still not well-controlled. She'll be admitted by the resident team.  Date: 03/07/2012  Rate: 102  Rhythm: sinus tachycardia  QRS Axis: normal  Intervals: normal  ST/T Wave abnormalities: normal  Conduction Disutrbances:none  Narrative Interpretation:   Old EKG Reviewed: unchanged    Glynn Octave, MD 03/07/12 1610  Glynn Octave, MD 03/07/12 0930

## 2012-03-07 NOTE — H&P (Signed)
Hospital Admission Note Date: 03/07/2012  Patient name: Donna Hall Medical record number: [id number] Date of birth: 09-14-1985 Age: 26 y.o. Gender: female PCP: None Primary Rheumatologist at [facility] [address] : Dr Volanda Napoleon   Medical Service: Internal Medicine Teaching Service   Attending physician:  Dr Lonzo Cloud     1st Contact: Dr Earlene Plater   Pager: [phone] 2nd Contact: Dr Dierdre Searles    Pager: [phone]  After 5 pm or weekends: 1st Contact:      Pager: [phone] 2nd Contact:      Pager: [phone]  Chief Complaint: Body pain   History of Present Illness: This is a 26 year old Hispanic, obese female with past medical history significant for systemic lupus ( Diagnosed 4 years ago), psoriasis and chronic pain syndrome who presented to the emergency room with severe body pain. Patient reports that she has been aching especially her joints since one week and has been experiencing some chest pain and shortness of breath with exertion. She noted since yesterday the pain is worse that she cannot walk, or do anything to do to the pain. The pain is located in her arms and her legs mainly. She feels that she has severe bone pain. She reports that she can not move her left arm and do anything with her do to the pain. She cannot really describe what the pain feels like but it is 15/10 in severity. She further reports about chest pain with deep inspiration and worsening shortness of breath even with lying. She had similar episodes in the past and reports that she has received some kind of medication which improved her pain but she cannot recall the name. She also endorses fevers but did not take her temperature and chills. She has been experiencing since 2 days of nausea and last night she vomited after eating dinner which was nonbloody nonbilious. She denies any abdominal pain, diarrhea, constipation or urinary symptoms. She denies any rashes, sick contact, recent travel inches in her medication. She  reports that after being on prednisone she has gained a lot of weight.  Of note: I spoke with Dr. Olean Ree ( rheumatologist from [facility]) reports that patient was admitted in June of this year with similar symptoms including pneumonia. She was evaluated in the clinic in August. At that time he recommended to continue prednisone 20 mg, plaquenil and Imuran. She has significant history of noncompliance due to financial restriction, illiteracy and social situation. She further has history of chronic pain.   Meds: Current Outpatient Rx  Name Route Sig Dispense Refill  . AZATHIOPRINE 50 MG PO TABS Oral Take by mouth daily.    Marland Kitchen FAMOTIDINE 20 MG PO TABS Oral Take 20 mg by mouth 2 (two) times daily.    Marland Kitchen HYDROXYCHLOROQUINE SULFATE 200 MG PO TABS Oral Take 200 mg by mouth daily.    Marland Kitchen HYDROXYZINE HCL 25 MG PO TABS Oral Take 25 mg by mouth 3 (three) times daily as needed.    Marland Kitchen PREDNISONE 50 MG PO TABS Oral Take 50 mg by mouth daily.    . SULFAMETHOXAZOLE-TRIMETHOPRIM 800-160 MG PO TABS Oral Take 1 tablet by mouth every Monday, Wednesday, and Friday.      Allergies: Allergies as of 03/07/2012  . (No Known Allergies)   Past Medical History  Diagnosis Date  . Lupus (systemic lupus erythematosus)   . Psoriasis   . Gestational diabetes   . Hypertension    History reviewed. No pertinent past surgical history. Family History  Problem  Relation Age of Onset  . Diabetes Mother   . Hypertension Mother    History   Social History  . Marital Status: Single    Spouse Name: N/A    Number of Children: N/A  . Years of Education: N/A   Occupational History  . None   Social History Main Topics  . Smoking status: Never Smoker   . Smokeless tobacco: Not on file  . Alcohol Use: No  . Drug Use: No  . Sexually Active:     Social History Narrative   Patient lives with husband and 2 children (age 36 and 7) in [place]. She migrated form [place] 10 years ago. She does not work. Has  completed the first grade. Cannot read and write.     Review of Systems: Bold if positive Constitutional: fever, chills, diaphoresis, appetite change and fatigue.  HEENT:  photophobia, eye pain, neck pain, neck stiffness    Respiratory:  SOB, DOE, cough, chest tightness,  and wheezing.   Cardiovascular:  chest pain, palpitations and leg swelling.  Gastrointestinal:  nausea, vomiting, abdominal pain, diarrhea, constipation, blood in stool and abdominal distention.  Genitourinary:  dysuria, urgency, frequency, hematuria, flank pain and difficulty urinating.  Musculoskeletal:  myalgias, back pain, joint swelling, arthralgias and gait problem.  Skin:  pallor, rash and wound.  Neurological:  dizziness, syncope,  numbness and headaches.    Physical Exam: Blood pressure 119/70, pulse 97, temperature 97.2 F (36.2 C), temperature source Oral, resp. rate 16, SpO2 97.00%. Constitutional: Vital signs reviewed.  Patient is a obese  And very uncomfortable appearing female crying throughout the conversation do to pain but  cooperative with exam. Alert and oriented x3.  Face: Moon face like Mouth: Poor dentition. No erythema or exudates, mucous membrane dry Eyes: PERRL, EOMI, conjunctivae injected, No scleral icterus.  Neck: Supple Cardiovascular: Tachycardic but regular , S1 normal, S2 normal, no MRG, pulses symmetric and intact bilaterally Pulmonary/Chest: Good air movement  rhonchi anteriorly. Not able to evaluate posteriorly due to patient's significant pain Abdominal: Soft. Non-tender,obese, bowel sounds are normal  Musculoskeletal: Significant joint swelling of both hands and feet , tenderness to palpation, mild erythema and decreased range of motion due to pain.  Neurological: A&O x3, not able to access patient's strengths do to significant pain, no focal motor deficit, sensory intact to light touch bilaterally.  Skin: Warm, dry and intact. No rash, cyanosis, or clubbing. Acanthosis nigricans  around the neck present   Lab results: Basic Metabolic Panel:  [facility] 03/07/12 0551  NA 140  K 3.7  CL 101  CO2 24  GLUCOSE 92  BUN 19  CREATININE 0.89  CALCIUM 9.2  MG --  PHOS --   Liver Function Tests:  Basename 03/07/12 0551  AST 29  ALT 17  ALKPHOS 74  BILITOT 0.2*  PROT 8.1  ALBUMIN 3.8    CBC:  Basename 03/07/12 0551  WBC 14.1*  NEUTROABS 9.6*  HGB 13.3  HCT 39.1  MCV 83.2  PLT 392    Imaging results:  Dg Chest 2 View  03/07/2012  *RADIOLOGY REPORT*  Clinical Data: Generalized body aches and fever for 3 days.  CHEST - 2 VIEW  Comparison: 09/29/2010  Findings: The shallow inspiration.  Borderline heart size and pulmonary vascularity similar previous study.  Suggestion of focal perihilar infiltration vertically on the left which is more prominent than on the prior study.  Changes may represent early pneumonia.  No blunting of costophrenic angles.  No pneumothorax. Mediastinal contours  appear intact.  IMPRESSION: Borderline heart size and pulmonary vascularity similar to previous study and possibly related to shallow inspiration.  Left perihilar infiltration may represent early pneumonia.   Original Report Authenticated By: Marlon Pel, M.D.    Ct Head Wo Contrast  03/07/2012  *RADIOLOGY REPORT*  Clinical Data: Generalized body aches  CT HEAD WITHOUT CONTRAST  Technique:  Contiguous axial images were obtained from the base of the skull through the vertex without contrast.  Comparison: Head CT 06/15/2006  Findings: No acute intracranial hemorrhage.  No focal mass lesion. No CT evidence of acute infarction.   No midline shift or mass effect.  No hydrocephalus.  Basilar cisterns are patent. No acute intracranial hemorrhage.  No focal mass lesion.  No CT evidence of acute infarction.   No midline shift or mass effect.  No hydrocephalus.  Basilar cisterns are patent.  IMPRESSION: No acute intracranial findings.   Original Report Authenticated By: Genevive Bi,  M.D.     Other results: OZH:YQMVH tachycardia with HR of 100.   Assessment & Plan by Problem:  1. Chest pain and shortness of breath; differential diagnosis is broad. Chest x-ray shows borderline heart size and poor vascularity similar to previous study and a left perihilar infiltrate. After reviewing the chest x-ray with the radiologist who compared today's chest x-ray with chest x-ray from 2010 he reports that the left hilar infiltrate looks exactly the same. But considering her history of lupus, chronic steroid use and high risk for infection but also for PE in the setting of current chest pain and shortness of breath would proceed with a CT angiogram for further evaluation. Other differential diagnoses include acute coronary syndrome although unlikely considering patient's age and no signs of ischemia on EKG. Patient has risk factors including lupus, obesity, possible hypertension, possible diabetes. Furthermore this could be just musculoskeletal do to synovitis in the setting of lupus flare. - Will admit patient to telemetry - Will obtain CT angiogram - Will obtain cardiac enzyme - Morphine and Toradol for pain - EKG in the morning  2. Diffuse body pain with joint swelling; likely synovitis in the setting of lupus flare. There's also a component of chronic pain syndrome or drug induced. Patient has significant joint swelling in both of her hands and her feet. It is warm to touch and tender. - Solu Medrol 125 mg x1 in may consider to repeat - Continue Imuran, Plaquenil -Hold prednisone by mouth for now but per rheumatology recommendation would start with prednisone 60 mg - Morphine and Toradol for pain - Flexeril for muscle spasm - UDS  3. Tachycardia most likely due to pain. Other differential diagnoses include as #1. Furthermore thyroid disease or drug induced - Will obtain TSH and UDS - Rest per #1  4.Questionable  Diabetes: Currently not on any medication. Considering patient's  obesity , long term use of steroids and history of gestational diet - Will obtain hemoglobin A1c - Will obtain lipid panel  5. Psoriasis : no active lesion noted  6. DVTppx:  Lovenox  Disp: Patient has followup with Dr. Annita Brod on 04/25/12 at 1:20 PM. Phone number is [phone]. Fax number is [phone]  Signed: Almyra Deforest 03/07/2012, 11:10 AM

## 2012-03-08 ENCOUNTER — Inpatient Hospital Stay (HOSPITAL_COMMUNITY): Payer: Medicaid Other

## 2012-03-08 ENCOUNTER — Encounter (HOSPITAL_COMMUNITY): Payer: Self-pay

## 2012-03-08 ENCOUNTER — Other Ambulatory Visit: Payer: Self-pay

## 2012-03-08 DIAGNOSIS — J8410 Pulmonary fibrosis, unspecified: Secondary | ICD-10-CM

## 2012-03-08 LAB — BASIC METABOLIC PANEL
Chloride: 105 mEq/L (ref 96–112)
Creatinine, Ser: 0.43 mg/dL — ABNORMAL LOW (ref 0.50–1.10)
GFR calc Af Amer: >90 mL/min (ref >90)
GFR calc non Af Amer: >90 mL/min (ref >90)
Potassium: 3.6 mEq/L (ref 3.5–5.1)

## 2012-03-08 LAB — SODIUM, URINE, RANDOM: Sodium, Ur: 39 mEq/L

## 2012-03-08 LAB — POCT PREGNANCY, URINE: Preg Test, Ur: NEGATIVE

## 2012-03-08 LAB — URINALYSIS, ROUTINE W REFLEX MICROSCOPIC
Bilirubin Urine: NEGATIVE
Glucose, UA: NEGATIVE mg/dL
Ketones, ur: NEGATIVE mg/dL
pH: 6 (ref 5.0–8.0)

## 2012-03-08 LAB — SEDIMENTATION RATE: Sed Rate: 67 mm/hr — ABNORMAL HIGH (ref 0–22)

## 2012-03-08 LAB — CBC
HCT: 33.3 % — ABNORMAL LOW (ref 36.0–46.0)
MCHC: 33.9 g/dL (ref 30.0–36.0)
RDW: 14.2 % (ref 11.5–15.5)
WBC: 7.8 10*3/uL (ref 4.0–10.5)

## 2012-03-08 LAB — LIPID PANEL
HDL: 45 mg/dL (ref >39)
LDL Cholesterol: 64 mg/dL (ref 0–99)
Triglycerides: 71 mg/dL (ref <150)

## 2012-03-08 LAB — RAPID URINE DRUG SCREEN, HOSP PERFORMED
Amphetamines: NOT DETECTED
Tetrahydrocannabinol: NOT DETECTED

## 2012-03-08 MED ORDER — PREDNISONE 50 MG PO TABS
60.0000 mg | ORAL_TABLET | Freq: Every day | ORAL | Status: DC
  Administered 2012-03-08 – 2012-03-13 (×6): 60 mg via ORAL
  Filled 2012-03-08 (×8): qty 1

## 2012-03-08 NOTE — Progress Notes (Signed)
Internal Medicine Teaching Service Attending Dr.Dajanae Brophy I have examined the patient at bedside today and discussed the management of the Patient with the resident team. Doubt the patient has lupus. Will need to rule out TB. Her PPD was negative in June but i do not have the details of the same.

## 2012-03-08 NOTE — Progress Notes (Signed)
Update to HPI:    Discussed the patient's history in further detail this afternoon. History obtained with the assistance of a telephone interpreter service.    Patient states that she experiences muscle weakness in addition to the pain in her joints. She states that her pain limits her more than her weakness. She admits to difficulty raising her arms above her shoulders to comb her hair or brush her teeth. She also admits to difficult sitting and rising from seated position and getting out of the car.      Patient denies a history of malar rash or rash confined to sun exposed areas. She states that periodically she has a "whitish" rough rash which is "kind of like dandruff." The rash can appear anywhere on her body but especially on her arms, elbows, and knees. She states that she has been diagnosed with psoriasis. She states that the last time she had these rashes was about 7 months ago.     Pain currently rated 5/10, significantly improved from 9/10 this morning. She states that the pain in her right hand is worst at this time and states that her right hand feels 'stiff.'       Patient's history was discussed with Dr. Lonzo Cloud.  Weakness: The history of possible proximal muscle weakness broadens the DDx. 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 Strongly consider steroid myopathy, but also polymyositis, IBM, dermatomyositis. Of note, her CK this admission was normal at 67. Further, she denies a history of heliotrope rash. -patient states she has had a muscle biopsy in the past, follow up on records requested from [facility]  Rash: The history provided by the patient strongly agrees with the diagnosis of psoriasis. This raises the new possibility of psoriatic arthritis as a cause for the patient's arthritis.  -X-rays of the hands. Particular interest in the right hand.   I reviewed above and agree.  Signed by:  Dorthula Rue. Earlene Plater, MD PGY-I, Internal  Medicine Pager [phone]  03/08/2012, 5:43 PM

## 2012-03-08 NOTE — Progress Notes (Addendum)
Medical Student Daily Progress Note  Subjective:   [name] is awake and alert. She speaks only spanish, an interpretor was present to assist throughout the history and physical.    [name] continues to complain of pain in her joints. She states that her pain is improved from yesterday, but she estimates the pain today is a 9/10 if yesterday's pain was a 10. She states that her pain is confined to her joints and specifically denied having pain any pain in her muscles. She states that she has had pain in her wrist and fingers for several months, but her pain acutely worsened over the past 2-3 days to include her ankles, knees, hips, shoulders, elbows, wrists and fingers.    Discussed the patient's treatment course in the hospital. On questioning, the patient stated that she was unaware of being diagnosed with lupus or a connective tissue disorder. However, she did recall that she had been diagnosed with psoriasis at some point.   Reviewed the patient's medications which she brought with her to the hospital. She confirms that prior to admission she has been taking her home medications, including famotidine, hydrochloroquine, hydroxyzine, prednisone, bactrim. She is also taking percocet at home. She did not recall taking azathioprine and did not have a bottle of azathioprine with her. She states that for the past 6 weeks she has been taking 1 tablet of 20 mg prednisone per day.    Incidentally, the patient reports that she has had a liver biopsy and a muscle biopsy at [facility] in the past, possibly 2-3 years ago.       Medications Prior to Admission  Medication Sig Dispense Refill  . famotidine (PEPCID) 20 MG tablet Take 20 mg by mouth 2 (two) times daily.      . hydroxychloroquine (PLAQUENIL) 200 MG tablet Take 200 mg by mouth daily.      . hydrOXYzine (ATARAX/VISTARIL) 25 MG tablet Take 25 mg by mouth 3 (three) times daily as needed.      Marland Kitchen oxyCODONE (OXY IR/ROXICODONE) 5 MG immediate release tablet  Take 5 mg by mouth every 6 (six) hours as needed.      . predniSONE (DELTASONE) 20 MG tablet Take 20 mg by mouth daily.      Marland Kitchen sulfamethoxazole-trimethoprim (BACTRIM DS,SEPTRA DS) 800-160 MG per tablet Take 1 tablet by mouth every Monday, Wednesday, and Friday.      . azaTHIOprine (IMURAN) 50 MG tablet Take by mouth daily.       Objective: Vital signs in last 24 hours: Filed Vitals:   03/07/12 1400 03/07/12 2113 03/07/12 2116 03/08/12 0500  BP: 127/78 105/69  116/63  Pulse: 84 96  75  Temp: 98.1 F (36.7 C) 98.7 F (37.1 C)  98.4 F (36.9 C)  TempSrc:  Oral  Oral  Resp: 22 24  20   Weight:      SpO2: 97% 88% 100% 95%    Intake/Output Summary (Last 24 hours) at 03/08/12 1027 Last data filed at 03/08/12 0940  Gross per 24 hour  Intake 2464.33 ml  Output    300 ml  Net 2164.33 ml   Physical Exam: BP 116/63  Pulse 75  Temp 98.4 F (36.9 C) (Oral)  Resp 20  Wt 119.886 kg (264 lb 4.8 oz)  SpO2 95%  LMP 02/29/2012  Lab Results: Basic Metabolic Panel:  Lab 03/08/12 4540 03/07/12 0551  NA 138 140  K 3.6 3.7  CL 105 101  CO2 24 24  GLUCOSE 94 92  BUN 16 19  CREATININE 0.43* 0.89  CALCIUM 9.0 9.2  MG -- --  PHOS -- --   Liver Function Tests:  Lab 03/07/12 0551  AST 29  ALT 17  ALKPHOS 74  BILITOT 0.2*  PROT 8.1  ALBUMIN 3.8   CBC:  Lab 03/08/12 0552 03/07/12 0551  WBC 7.8 14.1*  NEUTROABS -- 9.6*  HGB 11.3* 13.3  HCT 33.3* 39.1  MCV 83.9 83.2  PLT 355 392   Cardiac Enzymes:  Lab 03/07/12 1049  CKTOTAL 67  CKMB 2.3  CKMBINDEX --  TROPONINI <0.30   CBG:  Lab 03/08/12 0753  GLUCAP 85   Hemoglobin A1C:  Lab 03/07/12 1048  HGBA1C 5.8*   Fasting Lipid Panel:  Lab 03/08/12 0552  CHOL 123  HDL 45  LDLCALC 64  TRIG 71  CHOLHDL 2.7  LDLDIRECT --   Thyroid Function Tests:  Lab 03/07/12 1401  TSH 1.002  T4TOTAL --  FREET4 --  T3FREE --  THYROIDAB --   Urine Drug Screen: Drugs of Abuse     Component Value Date/Time   LABOPIA  POSITIVE* 03/07/2012 2354   COCAINSCRNUR NONE DETECTED 03/07/2012 2354   LABBENZ NONE DETECTED 03/07/2012 2354   AMPHETMU NONE DETECTED 03/07/2012 2354   THCU NONE DETECTED 03/07/2012 2354   LABBARB NONE DETECTED 03/07/2012 2354    Urinalysis:  Lab 03/07/12 2354  COLORURINE YELLOW  LABSPEC 1.020  PHURINE 6.0  GLUCOSEU NEGATIVE  HGBUR NEGATIVE  BILIRUBINUR NEGATIVE  KETONESUR NEGATIVE  PROTEINUR NEGATIVE  UROBILINOGEN 0.2  NITRITE NEGATIVE  LEUKOCYTESUR NEGATIVE   Misc. Labs: 9/12 C3: 128, normal 9/12 C4: 26, normal 9/12 HIV: negative 9/12 Urine Microalb: 2.47 High 9/12 Urine Microalb/Creat ratio: 16.5 normal 9/12 Urine sodium: 39, normal 9/12 Urine creatinine: 150.1, 125.82, normal  9/12 A1C 5.8  Labs of note from [facility] (between 11/2011 to 01/2012):  12/14/2011-12/15/2011: AFB Smear X3, all negative  12/19/2011: anti-dsDNA negative  12/13/2011: A1C 4.8   Micro Results: Recent Results (from the past 240 hour(s))  CULTURE, BLOOD (ROUTINE X 2)     Status: Normal (Preliminary result)   Collection Time   03/07/12  2:32 PM      Component Value Range Status Comment   Specimen Description BLOOD RIGHT ARM   Final    Special Requests BOTTLES DRAWN AEROBIC ONLY 10CC   Final    Culture  Setup Time 03/07/2012 20:21   Final    Culture     Final    Value:        BLOOD CULTURE RECEIVED NO GROWTH TO DATE CULTURE WILL BE HELD FOR 5 DAYS BEFORE ISSUING A FINAL NEGATIVE REPORT   Report Status PENDING   Incomplete   CULTURE, BLOOD (ROUTINE X 2)     Status: Normal (Preliminary result)   Collection Time   03/07/12  2:44 PM      Component Value Range Status Comment   Specimen Description BLOOD LEFT HAND   Final    Special Requests BOTTLES DRAWN AEROBIC ONLY 10CC   Final    Culture  Setup Time 03/07/2012 20:21   Final    Culture     Final    Value:        BLOOD CULTURE RECEIVED NO GROWTH TO DATE CULTURE WILL BE HELD FOR 5 DAYS BEFORE ISSUING A FINAL NEGATIVE REPORT   Report Status PENDING    Incomplete    Studies/Results: Dg Chest 2 View  03/07/2012  *RADIOLOGY REPORT*  Clinical Data: Generalized body  aches and fever for 3 days.  CHEST - 2 VIEW  Comparison: 09/29/2010  Findings: The shallow inspiration.  Borderline heart size and pulmonary vascularity similar previous study.  Suggestion of focal perihilar infiltration vertically on the left which is more prominent than on the prior study.  Changes may represent early pneumonia.  No blunting of costophrenic angles.  No pneumothorax. Mediastinal contours appear intact.  IMPRESSION: Borderline heart size and pulmonary vascularity similar to previous study and possibly related to shallow inspiration.  Left perihilar infiltration may represent early pneumonia.   Original Report Authenticated By: Marlon Pel, M.D.    Ct Head Wo Contrast  03/07/2012  *RADIOLOGY REPORT*  Clinical Data: Generalized body aches  CT HEAD WITHOUT CONTRAST  Technique:  Contiguous axial images were obtained from the base of the skull through the vertex without contrast.  Comparison: Head CT 06/15/2006  Findings: No acute intracranial hemorrhage.  No focal mass lesion. No CT evidence of acute infarction.   No midline shift or mass effect.  No hydrocephalus.  Basilar cisterns are patent. No acute intracranial hemorrhage.  No focal mass lesion.  No CT evidence of acute infarction.   No midline shift or mass effect.  No hydrocephalus.  Basilar cisterns are patent.  IMPRESSION: No acute intracranial findings.   Original Report Authenticated By: Genevive Bi, M.D.    Ct Angio Chest Pe W/cm &/or Wo Cm  03/07/2012  *RADIOLOGY REPORT*  Clinical Data: Shortness of breath.  CT ANGIOGRAPHY CHEST  Technique:  Multidetector CT imaging of the chest using the standard protocol during bolus administration of intravenous contrast. Multiplanar reconstructed images including MIPs were obtained and reviewed to evaluate the vascular anatomy.  Contrast:  90 ml Omnipaque 300 IV.   Comparison: 10/09/2007  Findings:  No filling defects in the pulmonary arteries to suggest pulmonary emboli.  Heart is borderline in size.  Pulmonary vascular congestion.  Ground-glass opacities throughout the lungs could reflect mild interstitial edema or areas of inflammation/small airways disease.  No pleural effusions.  Previously seen mediastinal adenopathy much improved.  No pathologically enlarged mediastinal lymph nodes currently. Resolution of the previously seen pericardial effusion.  Visualized thyroid and chest wall soft tissues unremarkable. Imaging into the upper abdomen shows no acute findings.  IMPRESSION: No filling defects seen to suggest pulmonary embolus.  Very low lung volumes.  Cardiomegaly with vascular congestion and scattered patchy ground-glass opacities, edema versus small airways disease/alveolitis.   Original Report Authenticated By: Cyndie Chime, M.D.    Medications: I have reviewed the patient's current medications. Scheduled Meds:   . acetaminophen  1,000 mg Oral Q6H  . azaTHIOprine  50 mg Oral Daily  . cyclobenzaprine  10 mg Oral BID  . docusate sodium  100 mg Oral BID  . enoxaparin (LOVENOX) injection  40 mg Subcutaneous Q24H  . hydroxychloroquine  200 mg Oral Daily  . ketorolac  30 mg Intravenous Q6H  . LORazepam  1 mg Oral Once  . pantoprazole  40 mg Oral Q1200  . sodium chloride  3 mL Intravenous Q12H  . sulfamethoxazole-trimethoprim  1 tablet Oral Q M,W,F  . DISCONTD: sulfamethoxazole-trimethoprim  1 tablet Oral Q M,W,F   Continuous Infusions:   . sodium chloride 100 mL/hr at 03/08/12 1013   PRN Meds:.hydrOXYzine, iohexol, morphine injection, ondansetron (ZOFRAN) IV, ondansetron, DISCONTD:  HYDROmorphone (DILAUDID) injection   Assessment/Plan:  1. Chest pain and shortness of breath. Pleuritic vs musculoskeletal. Suspect most like related to diffuse pain as discussed in #2. Unlikely cardiac  in origin based on history, however multiple risk factors  including obesity, lupus, and possibly diabetes.. EKG with sinus tachycardia, borderline QT prolongation likely 2/2 to plaquenel. Troponin, CK, CK-MB were normal. Per radiology, CXR with left perihilar infiltrate generally unchanged from prior CXR. Chest CT with diffuse vascular congestion and perihilar infiltrates, particularly in left upper lobe. Radiology and PCCM regard lung infiltrates on current chest CT as unchanged from previous CT in 2009. Cannot rule out early pneumonia, but patient is afebrile, vital signs are stable, and she is oxygenating well. We also feel that TB should be ruled out in the setting of immunocomprimise.  -patient admitted to telemetry -PCCM does not think bronchoscopy indicated.  -Patient currently on airborne isolation while we awake quantiferon assay to r/o TB -Morphine, Toradol, Acetaminophen for pain control   2. Diffuse arthralgia; Denies muscle pain at this time. Notable tenderness is observed in nearly all synovial joints in the upper and lower extremities. No obvious joint warmth and swelling noted on exam 03/08/2012. However, it is difficult to assess for joint swelling in the presence of the patient's profound cushingoid habitus. No erythema noted. Likely represents synovitis in the setting of lupus or other connective tissue disease. 03/07/2012, ESR was elevated at 67 and CRP elevated at 11.2, which is consistent with an inflammatory synovitis. However, notably, the C3 and C4 were normal yesterday. In the setting of a lupus flare, we would expect complement proteins to be depleted. The patient has been noted to have a "chronic pain syndrome" which is playing a role in the current presentation.    We are presently uncertain of the diagnosis of Lupus. The latest clinic note from Dr. Olean Ree at [facility], 02/15/2012 refers to the patient's condition as "connective tissue disease." The dsDNA 12/19/2011 was negative. We are attempting to obtain more records for [facility] to determine  what diagnostic measures have been taken. In the mean time we are checking ANA, RF, Parvo B19, Cyclic citrulline peptide antibody, and anticentromore antibody.  - Continue Imuran, Plaquenil  - Start with prednisone 60 mg  - Morphine, acetaminophen, and toradol for pain  -Parvo, ANA, RF, Cyclic Citrulline Antibody, and Anticentromere pending  3. Tachycardia most likely due to pain. EKG unremarkable. V/S otherwise stable. Obtained UDS, negative except for opiates. TSH was normal.  4.Questionable Diabetes: Currently not on any medication. Considering patient's obesity , long term use of steroids and history of gestational diet  - A1C 5.7, suggestive of impaired glucose tolerance, possibly associated with glucocorticoid use - Lipids were unremarkable   5. Psoriasis : no active lesion noted   6. DVTppx: Lovenox   Disp: Patient will remain in hospital pending quantiferon results to r/o Tb due to likelihood of poor compliance. Patient has followup with Dr. Annita Brod on 04/25/12 at 1:20 PM. Phone number is [phone]. Fax number is [phone]          Principal Problem:  *Lupus (systemic lupus erythematosus) Active Problems:  INTERSTITIAL LUNG DISEASE  DYSPNEA  Psoriasis  Synovitis  Chronic pain disorder  Chronic steroid use      LOS: 1 day   This is a Psychologist, occupational Note.  The care of the patient was discussed with Dr. Earlene Plater and the assessment and plan formulated with their assistance.  Please see their attached note for official documentation of the daily encounter.  PHILLIPPI, JASON E 03/08/2012, 10:27 AM   Addendum to medical student daily progress note:  I have seen the patient and reviewed the daily progress  note by Barbara Cower Phillippi and discussed the care of the patient with them. See below for documentation of my findings, assessment, and plans.   Subjective:    Interval Events:  Agree with above subjective information.  Patient seems very  knowledgeable about the medicines she does take; knowing the names, doses, frequency, and indications.  She did not know of azathioprine despite this being listed on her latest discharge summary from [facility].  Despite her knowledge of her medications, she had not heard of connective tissue disease or lupus, and was not aware of what rheumatology disease represent.  She thinks the Plaquenil is for psoriasis.    Objective:    Vital Signs:  Above vital signs were reviewed.   Filed Weights   03/07/12 1315  Weight: 264 lb 4.8 oz (119.886 kg)     Physical Exam: General appearance: alert, cooperative and no distress Resp: rales anterior - left Cardio: regular rate and rhythm Extremities: tenderness of phalangeal and carpal joints, no effusion appreciated Skin: Skin color, texture, turgor normal. No rashes or lesions    Labs:  Above labs and radiographic studies were reviewed.    Assessment/ Plan:    Agree with above plan.  We are waiting on the labs listed above that we ordered today as well as the TB test that was ordered yesterday.  In the meantime, we will try to learn more about her history from the patient and from additional records from [facility].   Length of Stay: 1 days   Signed by:  Dorthula Rue. Earlene Plater, MD PGY-I, Internal Medicine Pager [phone]  03/08/2012, 3:13 PM

## 2012-03-08 NOTE — Progress Notes (Signed)
Unable to perform PFT with Pt on Airborne precautions. Please Call [phone] (Resp. Dept) when Airborne restrictions removed.

## 2012-03-08 NOTE — Progress Notes (Deleted)
Name: Donna Hall MRN: [id number] DOB: 06-02-1986    LOS: 1  PULMONARY / CRITICAL CARE MEDICINE   Requesting:  Teaching service  Reason for consult:  Suspected pneumonia  HPI -  26 yo with SLE on chronic prednisone/plaquenil/imuran, chronic pain syndrome and medical noncompliance admitted 9/12 with one week history of severe generalized pain, bilateral lower extremity edema, pleuritic chest pain, nausea, subjective fevers and dyspnea. CXR demonstrated L hilar infiltrate suspicious for pneumonia and chest CTA was negative for pulmonary embolism.    Filed Vitals:   03/07/12 1400 03/07/12 2113 03/07/12 2116 03/08/12 0500  BP: 127/78 105/69  116/63  Pulse: 84 96  75  Temp: 98.1 F (36.7 C) 98.7 F (37.1 C)  98.4 F (36.9 C)  TempSrc:  Oral  Oral  Resp: 22 24  20   Weight:      SpO2: 97% 88% 100% 95%   Physical examination: Gen:  Obese female, no acute distress Neuro:  Sleepy, snores loudly, arouses to stimulation HEENT:  Crowded oropharynx, moist membranes Neck:  No JVD Heart:  Regular, no murmurs Chest:  Bilateral diminished air enry, no w/r/r Abdomen:  Obese, soft, bowel sounds present Extr:  Trace edema LE bilaterally Skin:  No rash  Labs:  Lab 03/08/12 0552 03/07/12 1049 03/07/12 0551  HGB 11.3* -- 13.3  WBC 7.8 -- 14.1*  PLT 355 -- 392  NA 138 -- 140  K 3.6 -- 3.7  CL 105 -- 101  CO2 24 -- 24  GLUCOSE 94 -- 92  BUN 16 -- 19  CREATININE 0.43* -- 0.89  CALCIUM 9.0 -- 9.2  MG -- -- --  PHOS -- -- --  AST -- -- 29  ALT -- -- 17  ALKPHOS -- -- 74  BILITOT -- -- 0.2*  PROT -- -- 8.1  ALBUMIN -- -- 3.8  INR -- -- --  APTT -- -- --  INR -- -- --  LATICACIDVEN -- -- --  TROPONINI -- <0.30 --  PHART -- -- --  PCO2ART -- -- --  PO2ART -- -- --  HCO3 -- -- --  O2SAT -- -- --   Imaging: Dg Chest 2 View  03/07/2012  *RADIOLOGY REPORT*  Clinical Data: Generalized body aches and fever for 3 days.  CHEST - 2 VIEW  Comparison: 09/29/2010  Findings: The  shallow inspiration.  Borderline heart size and pulmonary vascularity similar previous study.  Suggestion of focal perihilar infiltration vertically on the left which is more prominent than on the prior study.  Changes may represent early pneumonia.  No blunting of costophrenic angles.  No pneumothorax. Mediastinal contours appear intact.  IMPRESSION: Borderline heart size and pulmonary vascularity similar to previous study and possibly related to shallow inspiration.  Left perihilar infiltration may represent early pneumonia.   Original Report Authenticated By: Marlon Pel, M.D.    Ct Head Wo Contrast  03/07/2012  *RADIOLOGY REPORT*  Clinical Data: Generalized body aches  CT HEAD WITHOUT CONTRAST  Technique:  Contiguous axial images were obtained from the base of the skull through the vertex without contrast.  Comparison: Head CT 06/15/2006  Findings: No acute intracranial hemorrhage.  No focal mass lesion. No CT evidence of acute infarction.   No midline shift or mass effect.  No hydrocephalus.  Basilar cisterns are patent. No acute intracranial hemorrhage.  No focal mass lesion.  No CT evidence of acute infarction.   No midline shift or mass effect.  No hydrocephalus.  Basilar cisterns are patent.  IMPRESSION: No acute intracranial findings.   Original Report Authenticated By: Genevive Bi, M.D.    Ct Angio Chest Pe W/cm &/or Wo Cm  03/07/2012  *RADIOLOGY REPORT*  Clinical Data: Shortness of breath.  CT ANGIOGRAPHY CHEST  Technique:  Multidetector CT imaging of the chest using the standard protocol during bolus administration of intravenous contrast. Multiplanar reconstructed images including MIPs were obtained and reviewed to evaluate the vascular anatomy.  Contrast:  90 ml Omnipaque 300 IV.  Comparison: 10/09/2007  Findings:  No filling defects in the pulmonary arteries to suggest pulmonary emboli.  Heart is borderline in size.  Pulmonary vascular congestion.  Ground-glass opacities throughout  the lungs could reflect mild interstitial edema or areas of inflammation/small airways disease.  No pleural effusions.  Previously seen mediastinal adenopathy much improved.  No pathologically enlarged mediastinal lymph nodes currently. Resolution of the previously seen pericardial effusion.  Visualized thyroid and chest wall soft tissues unremarkable. Imaging into the upper abdomen shows no acute findings.  IMPRESSION: No filling defects seen to suggest pulmonary embolus.  Very low lung volumes.  Cardiomegaly with vascular congestion and scattered patchy ground-glass opacities, edema versus small airways disease/alveolitis.   Original Report Authenticated By: Cyndie Chime, M.D.    IMPRESSION/ PLAN:  Chest imaging negative for pulmonary embolism and new parenchymal findings Chronic left perihilar density and GGOs Suspected lupus flare Pleuritic chest pain Possible lupus pneumonitis or lupus-associated ILD No overt evidence of pulmonary infection Obesity Suspected obstructive sleep apnea / obesity hypoventilation syndrome Chronic pain syndrome Medical noncompliance  -->  No clear indications for bronchoscopic evaluation -->  C3, C4, ESR, CRP, RF -->  Continue / reinstitute immunosuppressive therapy -->  Full PFT -->  Polysomnography as outpatient -->  Pain control -->  Will follow PRN  Brett Canales Minor ACNP Adolph Pollack PCCM Pager [phone] till 3 pm If no answer page [phone] 03/08/2012, 11:31 AM

## 2012-03-08 NOTE — H&P (Signed)
Internal Medicine teaching Service Attending Dr.Avanish Cerullo. I have personally examined the patient and reviewed the h and P documented by the Resident. In brief  Chief complaint: pain HOPI: 26 year old [place] who does not speak much english is here for joint pains and SOB. Rest per resident documentation. 9 point review of system as documented in the Resident note. Social history admitting medication family history past surgical history allergies reviewed. Physical examination Notable for: stable vitals afebrile obese moon facies with tenderness on touch and swelling of multiple joints including joints of the hand. Labs are significant for :increased white count with increased ESR and CRP but normal complements Imaging is significant for: Pulmonary vascular congestion with patchy ground glass opacities. 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 Anti Jo1 ANA and ds DNA to be followed. Need to rule out TB. Will get an echo and r/o cardiac cause. Joint swelling. Should pursue testing for Parvo virus infection and RF. Suspect Mixed CTD given lack of history. Will review records from her rhematologist. Rest per resident documentation.

## 2012-03-08 NOTE — Consult Note (Signed)
Name: Donna Hall MRN: [id number] DOB: 1985-07-25    LOS: 1  PULMONARY / CRITICAL CARE MEDICINE   Requesting:  Teaching service  Reason for consult:  Suspected pneumonia  Patient description: 26 yo with SLE on chronic prednisone/plaquenil/imuran, chronic pain syndrome and medical noncompliance admitted 9/12 with one week history of severe generalized pain, bilateral lower extremity edema, pleuritic chest pain, nausea, subjective fevers and dyspnea. CXR demonstrated L hilar infiltrate suspicious for pneumonia and chest CTA was negative for pulmonary embolism.  Interval history:  No acute events overnight.  Physical examination: Gen:  Comfortable, no distress Neuro:  Awake, alert HEENT:  PERRL Neck:  No JVD Heart:  Regular, no murmurs Chest:  Bilateral diminished air enry, without added breath sounds Abdomen:  Obese, soft, bowel sounds present Extr:  Trace edema LE bilaterally Skin:  No rash  Labs:  Lab 03/08/12 0552 03/07/12 1049 03/07/12 0551  HGB 11.3* -- 13.3  WBC 7.8 -- 14.1*  PLT 355 -- 392  NA 138 -- 140  K 3.6 -- 3.7  CL 105 -- 101  CO2 24 -- 24  GLUCOSE 94 -- 92  BUN 16 -- 19  CREATININE 0.43* -- 0.89  CALCIUM 9.0 -- 9.2  MG -- -- --  PHOS -- -- --  AST -- -- 29  ALT -- -- 17  ALKPHOS -- -- 74  BILITOT -- -- 0.2*  PROT -- -- 8.1  ALBUMIN -- -- 3.8  INR -- -- --  APTT -- -- --  INR -- -- --  LATICACIDVEN -- -- --  TROPONINI -- <0.30 --  PHART -- -- --  PCO2ART -- -- --  PO2ART -- -- --  HCO3 -- -- --  O2SAT -- -- --   C3 128 C4 26 - wnl  Imaging: Dg Chest 2 View  03/07/2012  *RADIOLOGY REPORT*  Clinical Data: Generalized body aches and fever for 3 days.  CHEST - 2 VIEW  Comparison: 09/29/2010  Findings: The shallow inspiration.  Borderline heart size and pulmonary vascularity similar previous study.  Suggestion of focal perihilar infiltration vertically on the left which is more prominent than on the prior study.  Changes may represent  early pneumonia.  No blunting of costophrenic angles.  No pneumothorax. Mediastinal contours appear intact.  IMPRESSION: Borderline heart size and pulmonary vascularity similar to previous study and possibly related to shallow inspiration.  Left perihilar infiltration may represent early pneumonia.   Original Report Authenticated By: Marlon Pel, M.D.    Ct Head Wo Contrast  03/07/2012  *RADIOLOGY REPORT*  Clinical Data: Generalized body aches  CT HEAD WITHOUT CONTRAST  Technique:  Contiguous axial images were obtained from the base of the skull through the vertex without contrast.  Comparison: Head CT 06/15/2006  Findings: No acute intracranial hemorrhage.  No focal mass lesion. No CT evidence of acute infarction.   No midline shift or mass effect.  No hydrocephalus.  Basilar cisterns are patent. No acute intracranial hemorrhage.  No focal mass lesion.  No CT evidence of acute infarction.   No midline shift or mass effect.  No hydrocephalus.  Basilar cisterns are patent.  IMPRESSION: No acute intracranial findings.   Original Report Authenticated By: Genevive Bi, M.D.    Ct Angio Chest Pe W/cm &/or Wo Cm  03/07/2012  *RADIOLOGY REPORT*  Clinical Data: Shortness of breath.  CT ANGIOGRAPHY CHEST  Technique:  Multidetector CT imaging of the chest using the standard protocol during bolus administration of intravenous contrast.  Multiplanar reconstructed images including MIPs were obtained and reviewed to evaluate the vascular anatomy.  Contrast:  90 ml Omnipaque 300 IV.  Comparison: 10/09/2007  Findings:  No filling defects in the pulmonary arteries to suggest pulmonary emboli.  Heart is borderline in size.  Pulmonary vascular congestion.  Ground-glass opacities throughout the lungs could reflect mild interstitial edema or areas of inflammation/small airways disease.  No pleural effusions.  Previously seen mediastinal adenopathy much improved.  No pathologically enlarged mediastinal lymph nodes  currently. Resolution of the previously seen pericardial effusion.  Visualized thyroid and chest wall soft tissues unremarkable. Imaging into the upper abdomen shows no acute findings.  IMPRESSION: No filling defects seen to suggest pulmonary embolus.  Very low lung volumes.  Cardiomegaly with vascular congestion and scattered patchy ground-glass opacities, edema versus small airways disease/alveolitis.   Original Report Authenticated By: Cyndie Chime, M.D.    Dg Hand Complete Left  03/08/2012  *RADIOLOGY REPORT*  Clinical Data: Pain.  Evaluate for RA or psoriatic arthritis.  LEFT HAND - COMPLETE 3+ VIEW  Comparison: Right hand x-ray 07/18/2006.  Findings: No evidence of acute, subacute, or healed fractures. Well-preserved joint spaces.  No erosions.  Well-preserved bone mineral density.  Possible periarticular osteopenia as noted on the prior examination, unchanged.  No other intrinsic osseous abnormalities.  IMPRESSION: Possible periarticular osteopenia.  Otherwise normal examination. No erosions to confirm RA or psoriatic arthritis.   Original Report Authenticated By: Arnell Sieving, M.D.    Dg Hand Complete Right  03/08/2012  *RADIOLOGY REPORT*  Clinical Data: Pain.  Evaluate for possible RA or psoriatic arthritis.  RIGHT HAND - COMPLETE 3+ VIEW  Comparison: Left hand x-rays 07/18/2006.  Findings: No evidence of acute, subacute, or healed fractures. Well-preserved joint spaces.  No erosions.  Well-preserved bone mineral density.  Possible periarticular osteopenia as noted on the prior examination, unchanged.  No other intrinsic osseous abnormalities.  IMPRESSION: Possible periarticular osteopenia.  Otherwise normal examination. No erosions to confirm RA or psoriatic arthritis.  No significant interval change.   Original Report Authenticated By: Arnell Sieving, M.D.    IMPRESSION/ PLAN:  Chest imaging negative for pulmonary embolism and new parenchymal findings Chronic left perihilar density  and GGOs Lupus flare / acute pneumonitis / acute lupus-associated ILD unlikely given normal C3 and C4 No evidence to suggest active pulmonary TB Latent TB unlikely, especially given recent negative PPD Obesity Suspected obstructive sleep apnea / obesity hypoventilation syndrome Chronic pain syndrome Medical noncompliance  -->  No clear indications for bronchoscopic evaluation -->  Consider rheumatology consult to reevaluate the need for immunosuppressive therapy -->  Quantiferon gold if confirmation of PPD results is desired -->  Full PFT when out of isolation  -->  Polysomnography as outpatient -->  Pain control -->  Will follow  Orlean Bradford, M.D., F.C.C.P. Pulmonary and Critical Care Medicine [facility] Cell: [phone] Pager: [phone]

## 2012-03-09 DIAGNOSIS — L408 Other psoriasis: Secondary | ICD-10-CM

## 2012-03-09 MED ORDER — HYDROCODONE-ACETAMINOPHEN 5-325 MG PO TABS
1.0000 | ORAL_TABLET | Freq: Four times a day (QID) | ORAL | Status: DC | PRN
  Administered 2012-03-09 (×3): 1 via ORAL
  Administered 2012-03-10 – 2012-03-12 (×5): 2 via ORAL
  Filled 2012-03-09: qty 1
  Filled 2012-03-09 (×6): qty 2
  Filled 2012-03-09: qty 1

## 2012-03-09 MED ORDER — INFLUENZA VIRUS VACC SPLIT PF IM SUSP
0.5000 mL | INTRAMUSCULAR | Status: AC
  Administered 2012-03-11: 0.5 mL via INTRAMUSCULAR
  Filled 2012-03-09: qty 0.5

## 2012-03-09 MED ORDER — PNEUMOCOCCAL VAC POLYVALENT 25 MCG/0.5ML IJ INJ
0.5000 mL | INJECTION | INTRAMUSCULAR | Status: AC
  Administered 2012-03-10: 0.5 mL via INTRAMUSCULAR
  Filled 2012-03-09: qty 0.5

## 2012-03-09 NOTE — Progress Notes (Signed)
Medical Student Daily Progress Note  Subjective: No acute events overnight. The patient does endorse continued diffuse joint pain particularly in her hands and her right knee,  though notes her pain is less severe than yesterday. 7/10 today vs. 9/10 yesterday. She reports having difficulty moving her left arm because of pain in her shoulder. She denies any chest pain or shortness of breath. She denies any swelling in her legs or arms.  Objective: Vital signs in last 24 hours: Filed Vitals:   03/08/12 0500 03/08/12 1300 03/08/12 2121 03/09/12 0516  BP: 116/63 133/76 111/74 114/72  Pulse: 75 78 92 87  Temp: 98.4 F (36.9 C) 98.6 F (37 C) 98.6 F (37 C) 98.2 F (36.8 C)  TempSrc: Oral  Oral Oral  Resp: 20 20 20 18   Weight:      SpO2: 95% 96% 94% 98%   Weight change:   Intake/Output Summary (Last 24 hours) at 03/09/12 1610 Last data filed at 03/09/12 9604  Gross per 24 hour  Intake 2376.34 ml  Output      0 ml  Net 2376.34 ml   Physical Exam: General appearance: alert, cooperative, appears stated age and mild distress Lungs: fine crackles in right upper lobe. No crackles in left lung fields. No wheezing or rhonchi bilaterally. Good respiratory effort. Heart: regular rate and rhythm, S1, S2 normal, no murmur, click, rub or gallop Extremities: no focal joint swelling in upper or lower extremities. Has tenderness to palpation over PIP, DIP, wrist with some mild swelling and tightness of her hands bilaterally. Ankles tender to palpation bilaterally. No erythema or warmth over any joint spaces.  Pulses: 2+ radial pulses, 1+ DP pulses    Micro Results: Recent Results (from the past 240 hour(s))  CULTURE, BLOOD (ROUTINE X 2)     Status: Normal (Preliminary result)   Collection Time   03/07/12  2:32 PM      Component Value Range Status Comment   Specimen Description BLOOD RIGHT ARM   Final    Special Requests BOTTLES DRAWN AEROBIC ONLY 10CC   Final    Culture  Setup Time 03/07/2012  20:21   Final    Culture     Final    Value:        BLOOD CULTURE RECEIVED NO GROWTH TO DATE CULTURE WILL BE HELD FOR 5 DAYS BEFORE ISSUING A FINAL NEGATIVE REPORT   Report Status PENDING   Incomplete   CULTURE, BLOOD (ROUTINE X 2)     Status: Normal (Preliminary result)   Collection Time   03/07/12  2:44 PM      Component Value Range Status Comment   Specimen Description BLOOD LEFT HAND   Final    Special Requests BOTTLES DRAWN AEROBIC ONLY 10CC   Final    Culture  Setup Time 03/07/2012 20:21   Final    Culture     Final    Value:        BLOOD CULTURE RECEIVED NO GROWTH TO DATE CULTURE WILL BE HELD FOR 5 DAYS BEFORE ISSUING A FINAL NEGATIVE REPORT   Report Status PENDING   Incomplete    Studies/Results:  Dg Hand Complete Left  03/08/2012  *RADIOLOGY REPORT*  Clinical Data: Pain.  Evaluate for RA or psoriatic arthritis.  LEFT HAND - COMPLETE 3+ VIEW  Comparison: Right hand x-ray 07/18/2006.  Findings: No evidence of acute, subacute, or healed fractures. Well-preserved joint spaces.  No erosions.  Well-preserved bone mineral density.  Possible periarticular osteopenia as  noted on the prior examination, unchanged.  No other intrinsic osseous abnormalities.  IMPRESSION: Possible periarticular osteopenia.  Otherwise normal examination. No erosions to confirm RA or psoriatic arthritis.   Original Report Authenticated By: Arnell Sieving, M.D.    Dg Hand Complete Right  03/08/2012  *RADIOLOGY REPORT*  Clinical Data: Pain.  Evaluate for possible RA or psoriatic arthritis.  RIGHT HAND - COMPLETE 3+ VIEW  Comparison: Left hand x-rays 07/18/2006.  Findings: No evidence of acute, subacute, or healed fractures. Well-preserved joint spaces.  No erosions.  Well-preserved bone mineral density.  Possible periarticular osteopenia as noted on the prior examination, unchanged.  No other intrinsic osseous abnormalities.  IMPRESSION: Possible periarticular osteopenia.  Otherwise normal examination. No erosions to  confirm RA or psoriatic arthritis.  No significant interval change.   Original Report Authenticated By: Arnell Sieving, M.D.    Medications: I have reviewed the patient's current medications. Scheduled Meds:   . acetaminophen  1,000 mg Oral Q6H  . azaTHIOprine  50 mg Oral Daily  . cyclobenzaprine  10 mg Oral BID  . docusate sodium  100 mg Oral BID  . enoxaparin (LOVENOX) injection  40 mg Subcutaneous Q24H  . hydroxychloroquine  200 mg Oral Daily  . ketorolac  30 mg Intravenous Q6H  . pantoprazole  40 mg Oral Q1200  . predniSONE  60 mg Oral Q breakfast  . sodium chloride  3 mL Intravenous Q12H  . sulfamethoxazole-trimethoprim  1 tablet Oral Q M,W,F   Continuous Infusions:   . sodium chloride 100 mL/hr (03/09/12 0513)   PRN Meds:.hydrOXYzine, morphine injection, ondansetron (ZOFRAN) IV, ondansetron Assessment/Plan: Principal Problem:  *Lupus (systemic lupus erythematosus) Active Problems:  INTERSTITIAL LUNG DISEASE  DYSPNEA  Psoriasis  Synovitis  Chronic pain disorder  Chronic steroid use 1. Chest pain and shortness of breath. No complaints of chest pain or shortness of breath on exam. O2 sat 98% this morning on room air. Does have fine crackles in right upper lobe, no crackles in lower lobes concerning for fluid overload. Crackles likely from mild interstitial edema seen on CT. Has had no arrhythmias on telemetry. -Patient currently on airborne isolation while we awake quantiferon assay to r/o TB  -Morphine, Toradol, Acetaminophen for pain control   2. Diffuse arthralgia; Continues to have diffuse joint paint without swelling or erythema. Does report pain is somewhat improved from yesterday but continues to require around the clock toradol, morphine, and acetaminophen. Says that these medications do help her pain. Prednisone also started yesterday, which may be contributing. RF strongly positive. Still awaiting ANA, Parvo, CCP, Anti-centromere. Bilat hand x-rays negative for  erosions suggestive of RA, Psoriatic arthritis, though these are still very much in the differential. Clinical picture and serologies thus far non-diagnostic. Still awaiting [facility].  - Continue Imuran, Plaquenil, prednisone - Morphine, acetaminophen, and toradol for pain  -Parvo, ANA, Cyclic Citrulline Antibody, and Anticentromere pending   3. Tachycardia most likely due to pain. EKG unremarkable. V/S otherwise stable. Obtained UDS, negative except for opiates. TSH was normal.   4.Questionable Diabetes: Currently not on any medication. Considering patient's obesity , long term use of steroids and history of gestational diet  - A1C 5.7, suggestive of impaired glucose tolerance, possibly associated with glucocorticoid use  - Lipids were unremarkable  - CBG 90 this morning.  5. Psoriasis : no active lesion noted   6. DVTppx: Lovenox    LOS: 2 days   This is a Psychologist, occupational Note.  The care of  the patient was discussed with Dr. Dierdre Searles and the assessment and plan formulated with their assistance.  Please see their attached note for official documentation of the daily encounter.  Warren Lacy 03/09/2012, 9:03 AM Resident Co-sign Daily Note: I have seen the patient and reviewed the daily progress note by [facility] and discussed the care of the patient with them.  See below for documentation of my findings, assessment, and plans.  Subjective: See above note.  Objective: Vital signs in last 24 hours: Filed Vitals:   03/09/12 0516 03/09/12 1300 03/09/12 1803 03/09/12 2146  BP: 114/72 122/76  124/71  Pulse: 87 90  85  Temp: 98.2 F (36.8 C) 98.4 F (36.9 C)  98.1 F (36.7 C)  TempSrc: Oral   Oral  Resp: 18 20  18   Height:   5\' 4"  (1.626 m)   Weight:      SpO2: 98% 96%  93%   Physical Exam: General appearance: alert, cooperative, appears stated age and mild distress  Lungs: fine crackles in right upper lobe. No crackles in left lung fields. No wheezing or  rhonchi bilaterally. Good respiratory effort.  Heart: regular rate and rhythm, S1, S2 normal, no murmur, click, rub or gallop  Extremities: no focal joint swelling in upper or lower extremities. Has tenderness to palpation over PIP, DIP, wrist with some mild swelling and tightness of her hands bilaterally. Ankles tender to palpation bilaterally. No erythema or warmth over any joint spaces.  Pulses: 2+ radial pulses, 1+ DP pulses  Lab Results: Reviewed and documented in Electronic Record Micro Results: Reviewed and documented in Electronic Record Studies/Results: Reviewed and documented in Electronic Record Medications: I have reviewed the patient's current medications. Scheduled Meds:   . azaTHIOprine  50 mg Oral Daily  . cyclobenzaprine  10 mg Oral BID  . docusate sodium  100 mg Oral BID  . enoxaparin (LOVENOX) injection  40 mg Subcutaneous Q24H  . hydroxychloroquine  200 mg Oral Daily  . influenza  inactive virus vaccine  0.5 mL Intramuscular Tomorrow-1000  . ketorolac  30 mg Intravenous Q6H  . pantoprazole  40 mg Oral Q1200  . pneumococcal 23 valent vaccine  0.5 mL Intramuscular Tomorrow-1000  . predniSONE  60 mg Oral Q breakfast  . sodium chloride  3 mL Intravenous Q12H  . sulfamethoxazole-trimethoprim  1 tablet Oral Q M,W,F  . DISCONTD: acetaminophen  1,000 mg Oral Q6H   Continuous Infusions:   . sodium chloride 100 mL/hr at 03/09/12 1422   PRN Meds:.HYDROcodone-acetaminophen, hydrOXYzine, ondansetron (ZOFRAN) IV, ondansetron, DISCONTD:  morphine injection Assessment/Plan:  Agree with above plan. We are waiting on the labs listed above that we ordered today as well as the TB test that was ordered yesterday. In the meantime, we will try to learn more about her history from the patient and from additional records from [facility]. I have changed her pain management to Vicotin PRN with Toradol IV standing orders. Unable to deescalate her Toradol today due to significant pain even though  her pain is much better. Plan to deescalate pain management of Toradol to PO NSAIDS as early as possible   LOS: 2 days   Rip Hawes 03/09/2012, 9:47 PM

## 2012-03-09 NOTE — Plan of Care (Signed)
Problem: Phase III Progression Outcomes Goal: Activity at appropriate level-compared to baseline (UP IN CHAIR FOR HEMODIALYSIS)  Outcome: Completed/Met Date Met:  03/09/12 OOB with supervision

## 2012-03-09 NOTE — Progress Notes (Signed)
Admission information obtained using [employer] number [id number]. Julien Nordmann [facility]

## 2012-03-10 LAB — GLUCOSE, CAPILLARY: Glucose-Capillary: 74 mg/dL (ref 70–99)

## 2012-03-10 MED ORDER — NAPROXEN 500 MG PO TABS
500.0000 mg | ORAL_TABLET | Freq: Two times a day (BID) | ORAL | Status: DC
  Administered 2012-03-10: 500 mg via ORAL
  Filled 2012-03-10 (×2): qty 1

## 2012-03-10 MED ORDER — KETOROLAC TROMETHAMINE 30 MG/ML IJ SOLN
30.0000 mg | Freq: Four times a day (QID) | INTRAMUSCULAR | Status: DC | PRN
  Administered 2012-03-10 – 2012-03-11 (×2): 30 mg via INTRAVENOUS
  Filled 2012-03-10: qty 1

## 2012-03-10 MED ORDER — HYDROCODONE-ACETAMINOPHEN 5-325 MG PO TABS
2.0000 | ORAL_TABLET | Freq: Once | ORAL | Status: AC
  Administered 2012-03-10: 2 via ORAL
  Filled 2012-03-10: qty 2

## 2012-03-10 NOTE — Plan of Care (Addendum)
Problem: Discharge Progression Outcomes Goal: Activity appropriate for discharge plan Outcome: Progressing Ambulating in room with supervision 3-4 times a day

## 2012-03-10 NOTE — Progress Notes (Signed)
Medical Student Daily Progress Note  Subjective: No acute events overnight. Joint pain and hand swelling are persistent. No chest pain or shortness or breath. No LE swelling. Objective: Vital signs in last 24 hours: Filed Vitals:   03/09/12 1300 03/09/12 1803 03/09/12 2146 03/10/12 0529  BP: 122/76  124/71 113/70  Pulse: 90  85 62  Temp: 98.4 F (36.9 C)  98.1 F (36.7 C) 98.4 F (36.9 C)  TempSrc:   Oral Oral  Resp: 20  18 20   Height:  5\' 4"  (1.626 m)    Weight:      SpO2: 96%  93% 94%   Weight change:   Intake/Output Summary (Last 24 hours) at 03/10/12 1205 Last data filed at 03/10/12 0600  Gross per 24 hour  Intake 3018.33 ml  Output      0 ml  Net 3018.33 ml   Physical Exam: General appearance: alert, cooperative, appears stated age and no distress  Lungs: fine crackles in right upper lobe. No crackles in left lung fields. This is unchanged. No wheezing or rhonchi bilaterally. Good respiratory effort.  Heart: regular rate and rhythm, S1, S2 normal, no murmur, click, rub or gallop  Extremities: no focal joint swelling in upper or lower extremities. Has tenderness to palpation over PIP, DIP, wrist and hands continue to be swollen bilaterally. No tenderness to knee or ankle palpation. No LE edema. No erythema or warmth over any joint spaces.  Pulses: 2+ radial pulses, 1+ DP pulses   Micro Results: Recent Results (from the past 240 hour(s))  CULTURE, BLOOD (ROUTINE X 2)     Status: Normal (Preliminary result)   Collection Time   03/07/12  2:32 PM      Component Value Range Status Comment   Specimen Description BLOOD RIGHT ARM   Final    Special Requests BOTTLES DRAWN AEROBIC ONLY 10CC   Final    Culture  Setup Time 03/07/2012 20:21   Final    Culture     Final    Value:        BLOOD CULTURE RECEIVED NO GROWTH TO DATE CULTURE WILL BE HELD FOR 5 DAYS BEFORE ISSUING A FINAL NEGATIVE REPORT   Report Status PENDING   Incomplete   CULTURE, BLOOD (ROUTINE X 2)     Status:  Normal (Preliminary result)   Collection Time   03/07/12  2:44 PM      Component Value Range Status Comment   Specimen Description BLOOD LEFT HAND   Final    Special Requests BOTTLES DRAWN AEROBIC ONLY 10CC   Final    Culture  Setup Time 03/07/2012 20:21   Final    Culture     Final    Value:        BLOOD CULTURE RECEIVED NO GROWTH TO DATE CULTURE WILL BE HELD FOR 5 DAYS BEFORE ISSUING A FINAL NEGATIVE REPORT   Report Status PENDING   Incomplete     Medications: I have reviewed the patient's current medications. Scheduled Meds:   . azaTHIOprine  50 mg Oral Daily  . cyclobenzaprine  10 mg Oral BID  . docusate sodium  100 mg Oral BID  . enoxaparin (LOVENOX) injection  40 mg Subcutaneous Q24H  . HYDROcodone-acetaminophen  2 tablet Oral Once  . hydroxychloroquine  200 mg Oral Daily  . influenza  inactive virus vaccine  0.5 mL Intramuscular Tomorrow-1000  . ketorolac  30 mg Intravenous Q6H  . pantoprazole  40 mg Oral Q1200  . pneumococcal 23  valent vaccine  0.5 mL Intramuscular Tomorrow-1000  . predniSONE  60 mg Oral Q breakfast  . sodium chloride  3 mL Intravenous Q12H  . sulfamethoxazole-trimethoprim  1 tablet Oral Q M,W,F  . DISCONTD: acetaminophen  1,000 mg Oral Q6H   Continuous Infusions:   . sodium chloride Stopped (03/10/12 1036)   PRN Meds:.HYDROcodone-acetaminophen, hydrOXYzine, ondansetron (ZOFRAN) IV, ondansetron Assessment/Plan: Principal Problem:  *Lupus (systemic lupus erythematosus) Active Problems:  INTERSTITIAL LUNG DISEASE  DYSPNEA  Psoriasis  Synovitis  Chronic pain disorder  Chronic steroid use  1. Chest pain and shortness of breath. No complaints of chest pain or shortness of breath on exam. O2 sat 94% this morning on room air. Does have fine crackles in right upper lobe, which are unchanged, but no crackles in lower lobes concerning for fluid overload. Crackles likely from mild interstitial edema seen on CT. Has had no arrhythmias on telemetry. Continue  inpatient status until r/o TB  -Patient currently on airborne isolation while we await quantiferon assay to r/o TB  -Switching to vicodin and naproxen for pain control   2. Diffuse arthralgia; Continues to have diffuse joint paint without swelling or erythema. Does have generalized hand swelling bilaterally. Morphine was discontinued yesterday and patient is doing well with toradol and vicodin, both of which she says help her pain. RF strongly positive. Still awaiting CCP, ANA, Parvo, Anti centromere  -Continue Imuran, Plaquenil, prednisone  - Transitioning from toradol to naproxen for pain control to reduce reliance on IV pain meds -Continue vicodin  -Parvo, ANA, Cyclic Citrulline Antibody, and Anticentromere pending    3.Questionable Diabetes: Currently not on any medication. Considering patient's obesity , long term use of steroids and history of gestational diet  - A1C 5.7, suggestive of impaired glucose tolerance, possibly associated with glucocorticoid use   - CBG 74 this morning.   4. Psoriasis : no active lesion noted   5. DVTppx: Lovenox    LOS: 3 days   This is a Psychologist, occupational Note.  The care of the patient was discussed with Dr. Dierdre Searles and the assessment and plan formulated with their assistance.  Please see their attached note for official documentation of the daily encounter.  Warren Lacy 03/10/2012, 12:05 PM  Resident Co-sign Daily Note: I have seen the patient and reviewed the daily progress note by MS 4 [facility] and discussed the care of the patient with them.  See below for documentation of my findings, assessment, and plans.  Subjective: See above note  Objective: Vital signs in last 24 hours: Filed Vitals:   03/09/12 1803 03/09/12 2146 03/10/12 0529 03/10/12 1300  BP:  124/71 113/70 120/72  Pulse:  85 62 67  Temp:  98.1 F (36.7 C) 98.4 F (36.9 C) 98.4 F (36.9 C)  TempSrc:  Oral Oral   Resp:  18 20 18   Height: 5\' 4"  (1.626 m)     Weight:       SpO2:  93% 94% 95%   Physical Exam: General appearance: alert, cooperative, appears stated age and no distress  Lungs: fine crackles in right upper lobe. No crackles in left lung fields. This is unchanged. No wheezing or rhonchi bilaterally. Good respiratory effort.  Heart: regular rate and rhythm, S1, S2 normal, no murmur, click, rub or gallop  Extremities: no focal joint swelling in upper or lower extremities. Has tenderness to palpation over PIP, DIP, wrist and hands continue to be swollen bilaterally. No tenderness to knee or ankle palpation. No LE edema.  No erythema or warmth over any joint spaces.  Pulses: 2+ radial pulses, 1+ DP pulses   Lab Results: Reviewed and documented in Electronic Record Micro Results: Reviewed and documented in Electronic Record Studies/Results: Reviewed and documented in Electronic Record Medications: I have reviewed the patient's current medications. Scheduled Meds:   . azaTHIOprine  50 mg Oral Daily  . cyclobenzaprine  10 mg Oral BID  . docusate sodium  100 mg Oral BID  . enoxaparin (LOVENOX) injection  40 mg Subcutaneous Q24H  . HYDROcodone-acetaminophen  2 tablet Oral Once  . hydroxychloroquine  200 mg Oral Daily  . influenza  inactive virus vaccine  0.5 mL Intramuscular Tomorrow-1000  . naproxen  500 mg Oral BID WC  . pantoprazole  40 mg Oral Q1200  . pneumococcal 23 valent vaccine  0.5 mL Intramuscular Tomorrow-1000  . predniSONE  60 mg Oral Q breakfast  . sodium chloride  3 mL Intravenous Q12H  . sulfamethoxazole-trimethoprim  1 tablet Oral Q M,W,F  . DISCONTD: ketorolac  30 mg Intravenous Q6H   Continuous Infusions:   . DISCONTD: sodium chloride Stopped (03/10/12 1036)   PRN Meds:.HYDROcodone-acetaminophen, hydrOXYzine, ondansetron (ZOFRAN) IV, ondansetron Assessment/Plan:  Better pain control today.  Today's day 4 for Toradol and and will switch Toradol to naproxen for pain control.  We'll also check BMP for kidney function  monitoring.  Vicodin PRN was also added to her regimen for breakthrough pain.  I think that we can discharge her in 1-2 days after we achieve satisfactory pain control and have her followup with her rheumatologist at [facility] for continuous rheumatology workup.  Patient agrees with the above plan.   LOS: 3 days   Promyse Ardito 03/10/2012, 5:09 PM

## 2012-03-11 ENCOUNTER — Encounter (HOSPITAL_COMMUNITY): Payer: Self-pay

## 2012-03-11 DIAGNOSIS — R918 Other nonspecific abnormal finding of lung field: Secondary | ICD-10-CM

## 2012-03-11 LAB — GLUCOSE, CAPILLARY

## 2012-03-11 LAB — PARVOVIRUS B19 ANTIBODY, IGG AND IGM: Parovirus B19 IgG Abs: 4 index — ABNORMAL HIGH (ref <0.9)

## 2012-03-11 LAB — BASIC METABOLIC PANEL
BUN: 17 mg/dL (ref 6–23)
CO2: 27 mEq/L (ref 19–32)
Chloride: 105 mEq/L (ref 96–112)
Potassium: 3.3 mEq/L — ABNORMAL LOW (ref 3.5–5.1)

## 2012-03-11 MED ORDER — NAPROXEN 500 MG PO TABS
500.0000 mg | ORAL_TABLET | Freq: Two times a day (BID) | ORAL | Status: DC
  Administered 2012-03-11 – 2012-03-13 (×6): 500 mg via ORAL
  Filled 2012-03-11 (×8): qty 1

## 2012-03-11 MED ORDER — POTASSIUM CHLORIDE CRYS ER 20 MEQ PO TBCR
40.0000 meq | EXTENDED_RELEASE_TABLET | Freq: Once | ORAL | Status: AC
  Administered 2012-03-12: 40 meq via ORAL
  Filled 2012-03-11: qty 1
  Filled 2012-03-11: qty 2
  Filled 2012-03-11: qty 1

## 2012-03-11 MED ORDER — ACETAMINOPHEN 500 MG PO TABS
500.0000 mg | ORAL_TABLET | Freq: Four times a day (QID) | ORAL | Status: DC
  Administered 2012-03-11 – 2012-03-13 (×9): 500 mg via ORAL
  Filled 2012-03-11 (×14): qty 1

## 2012-03-11 NOTE — Progress Notes (Signed)
Infection Prevention asks that you please consult with them before dc'ing airborne precautions on this patient when quant gold test results.

## 2012-03-11 NOTE — Progress Notes (Addendum)
Internal Medicine Attending  Date: 03/11/2012  Patient name: Donna Hall Medical record number: [id number] Date of birth: January 11, 1986 Age: 27 y.o. Gender: female  I saw and evaluated the patient. I reviewed the resident's note by Dr. Earlene Plater and MS3 Phillipi and I agree with the resident's findings and plans as documented in his progress note.  Signed Criselda Peaches, Ayani Ospina

## 2012-03-11 NOTE — Progress Notes (Addendum)
Name: Mishel Sans MRN: [id number] DOB: 1985/11/16    LOS: 4  PULMONARY / CRITICAL CARE MEDICINE   Requesting:  Teaching service  Reason for consult:  Suspected pneumonia  Patient description: 26 yo with SLE on chronic prednisone / plaquenil / imuran, chronic pain syndrome and medical noncompliance admitted 9/12 with one week history of severe generalized pain, bilateral lower extremity edema, pleuritic chest pain, nausea, subjective fevers and dyspnea. CXR demonstrated L hilar infiltrate suspicious for pneumonia and chest CTA was negative for pulmonary embolism.  Interval history:  No acute events overnight.  Complains of joint pain.    Physical examination: Gen:  Comfortable, no distress Neuro:  Awake, alert HEENT:  PERRL Neck:  No JVD Heart:  Regular, no murmurs Chest:  resp's even/non-labored on RA, lungs bilaterally clear Abdomen:  Obese, soft, bowel sounds present Extr:  Trace edema LE bilaterally Skin:  No rash  Labs:  Lab 03/11/12 0803 03/08/12 0552 03/07/12 1049 03/07/12 0551  HGB -- 11.3* -- 13.3  WBC -- 7.8 -- 14.1*  PLT -- 355 -- 392  NA 141 138 -- 140  K 3.3* 3.6 -- --  CL 105 105 -- 101  CO2 27 24 -- 24  GLUCOSE 79 94 -- 92  BUN 17 16 -- 19  CREATININE 0.53 0.43* -- 0.89  CALCIUM 8.6 9.0 -- 9.2  MG -- -- -- --  PHOS -- -- -- --  AST -- -- -- 29  ALT -- -- -- 17  ALKPHOS -- -- -- 74  BILITOT -- -- -- 0.2*  PROT -- -- -- 8.1  ALBUMIN -- -- -- 3.8  INR -- -- -- --  APTT -- -- -- --  INR -- -- -- --  LATICACIDVEN -- -- -- --  TROPONINI -- -- <0.30 --  PHART -- -- -- --  PCO2ART -- -- -- --  PO2ART -- -- -- --  HCO3 -- -- -- --  O2SAT -- -- -- --   C3 128 C4 26 - wnl  Imaging: No results found.  IMPRESSION/ PLAN:  Chest imaging negative for pulmonary embolism and new parenchymal findings Chronic left perihilar density and GGOs No evidence to suggest active pulmonary TB.  Latent TB unlikely, especially given recent negative  PPD Suspected obstructive sleep apnea / obesity hypoventilation syndrome Lupus flare / acute pneumonitis / acute lupus-associated ILD unlikely given normal C3 and C4 and no active urinary sediment  -  No clear indications for bronchoscopic evaluation -  Follow up with rheumatology at [facility] to reevaluate the need for immunosuppressive therapy -  Follow up Quantiferon gold for confirmation of PPD results  -  Full PFT when out of isolation  -  Polysomnography as outpatient   Obesity - per primary SVC  Chronic pain syndrome / Joint Pain - per primary SVC  Medical noncompliance -per primary SVC   Canary Brim, NP-C [facility] Pgr: [phone] or [phone]  Pt independently  seen and examined and available cxr's reviewed and I agree with above findings/ imp/ plan    Sandrea Hughs, MD Pulmonary and Critical Care Medicine [facility] Cell [phone]

## 2012-03-11 NOTE — Progress Notes (Signed)
Medical Student Daily Progress Note  Subjective: The patient is awake and alert. She states that her pain is significantly improved today, at 4 or 5 out of 10. She states that the pain is mostly confined to the shoulders, elbows, and knees and is significantly milder than yesterday. Patient denies chest pain or shortness of breath.    Objective: Vital signs in last 24 hours: Filed Vitals:   03/10/12 0529 03/10/12 1300 03/10/12 2157 03/11/12 0626  BP: 113/70 120/72 111/76 114/71  Pulse: 62 67 85 67  Temp: 98.4 F (36.9 C) 98.4 F (36.9 C) 98.4 F (36.9 C) 98.7 F (37.1 C)  TempSrc: Oral  Oral Oral  Resp: 20 18 16 18   Height:      Weight:      SpO2: 94% 95% 96% 95%   Weight change:   Intake/Output Summary (Last 24 hours) at 03/11/12 1202 Last data filed at 03/11/12 1038  Gross per 24 hour  Intake    503 ml  Output      0 ml  Net    503 ml   Physical Exam: BP 114/71  Pulse 67  Temp 98.7 F (37.1 C) (Oral)  Resp 18  Ht 5\' 4"  (1.626 m)  Wt 119.886 kg (264 lb 4.8 oz)  BMI 45.37 kg/m2  SpO2 95%  LMP 02/29/2012  General: Alert, oriented, obese and cushingoid. Sitting upright in no apparent distress. CV: Normal S1/S2, no r/m/g Pulm: Mild crackles in upper lobes, L>R. No increased work of breathing. Abdomen: Obese. Non distended, non tender. Normoactive bowel sounds. Extremities: Warm and well perfused. Radial pulses 2+ bilaterally, DP pulses 1+ bilaterally. Tender to palpation at elbow, shoulder, and knees. No erythema, no swelling appreciated. No tenderness in feet, ankles, wrists, and hands. Neuro: Moves all extremities spontaneously. No obvious neurological deficits.   Lab Results: Basic Metabolic Panel:  Lab 03/11/12 2130 03/08/12 0552  NA 141 138  K 3.3* 3.6  CL 105 105  CO2 27 24  GLUCOSE 79 94  BUN 17 16  CREATININE 0.53 0.43*  CALCIUM 8.6 9.0  MG -- --  PHOS -- --   Liver Function Tests:  Lab 03/07/12 0551  AST 29  ALT 17  ALKPHOS 74  BILITOT  0.2*  PROT 8.1  ALBUMIN 3.8   CBC:  Lab 03/08/12 0552 03/07/12 0551  WBC 7.8 14.1*  NEUTROABS -- 9.6*  HGB 11.3* 13.3  HCT 33.3* 39.1  MCV 83.9 83.2  PLT 355 392    CBG:  Lab 03/11/12 0747 03/10/12 0731 03/09/12 0749 03/08/12 0753  GLUCAP 79 74 90 85     Misc. Labs: 9/13 RF >600 9/12 ESR 67, CRP 11.2  Micro: 9/12 BCx no growth to date 9/16   Medications: I have reviewed the patient's current medications. Scheduled Meds:    . acetaminophen  500 mg Oral Q6H  . azaTHIOprine  50 mg Oral Daily  . cyclobenzaprine  10 mg Oral BID  . docusate sodium  100 mg Oral BID  . enoxaparin (LOVENOX) injection  40 mg Subcutaneous Q24H  . hydroxychloroquine  200 mg Oral Daily  . influenza  inactive virus vaccine  0.5 mL Intramuscular Tomorrow-1000  . naproxen  500 mg Oral BID WC  . pantoprazole  40 mg Oral Q1200  . predniSONE  60 mg Oral Q breakfast  . sodium chloride  3 mL Intravenous Q12H  . sulfamethoxazole-trimethoprim  1 tablet Oral Q M,W,F  . DISCONTD: ketorolac  30 mg Intravenous Q6H  .  DISCONTD: naproxen  500 mg Oral BID WC   Continuous Infusions:    . DISCONTD: sodium chloride Stopped (03/10/12 1036)   PRN Meds:.HYDROcodone-acetaminophen, hydrOXYzine, ondansetron (ZOFRAN) IV, ondansetron, DISCONTD: ketorolac   Assessment/Plan:  1. Chest pain and shortness of breath. No complaints of chest pain or shortness of breath today, 9/16. O2 sat 95% this morning on room air. Some crackles in upper lobes, which have improved somewhat since admission.  Crackles likely from mild interstitial edema seen on CT. CT is largely unchanged from previous admissions, and infiltrates in lungs appear to be chronic. Has had no arrhythmias on telemetry. We are awaiting Quantiferon to r/o TB.  -Patient currently on airborne isolation while we await quantiferon assay to r/o TB  -Switching to vicodin and naproxen for pain control, morphine d/ced 9/14, toradol d/c'ed this morning 9/16   2.  Diffuse arthralgia; Presentation on admission, with pain and severe tenderness of most synovial joints, consistent with an acute synovitis. Overall, her joint pain is significantly today with only mild tenderness in her shoulders, elbows, and knees. There is some generalized hand swelling bilaterally. X-rays of hands 9/13 showed periarticular osteopenia, but no erosions diagnostic of RA or psoriatic arthritis. Patient is on a regimen of naproxen and vicodin which she says is working well. 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 On this visit: ESR 67 and CRP 11.2, both elevated. Complement proteins were normal, inconsistent with a lupus exacerbation. RF strongly positive at >600, making psoriatic arthritis unlikely. Still awaiting CCP, ANA, Parvo, Anti centromere.   -Continue Imuran, Plaquenil -Patient on oral prednisone 60mg  qday currently -Transitioning from toradol to naproxen for pain control to reduce reliance on IV pain meds  -Continue vicodin  -Parvo, ANA, Cyclic Citrulline Antibody, and Anticentromere pending    3.Possible Glucose Intolerance: A1C of 5.7 on admission, however CBGs have been normal throughout hospital course. Risk factors to consider, patient's obesity, long term use of steroids, and history of gestational diabetes   - A1C 5.7, suggestive of impaired glucose tolerance, possibly associated with glucocorticoid use  - CBG 79 this morning.    4. Psoriasis : no active lesion noted    5. DVTppx: Lovenox  6. Disposition -awaiting results of quantiferon assay prior to discharging patient, due to concerns over compliance   Principal Problem:  *Lupus (systemic lupus erythematosus) Active Problems:  INTERSTITIAL LUNG DISEASE  DYSPNEA  Psoriasis  Synovitis  Chronic pain disorder  Chronic steroid use   LOS: 4 days   This is a Psychologist, occupational Note.  The care of the patient was  discussed with Dr. Earlene Plater and the assessment and plan formulated with their assistance.  Please see their attached note for official documentation of the daily encounter.  PHILLIPPI, JASON E 03/11/2012, 12:02 PM   Addendum to medical student daily progress note:  I have seen the patient and reviewed the daily progress note by Barbara Cower Phillippi and discussed the care of the patient with them. See below for documentation of my findings, assessment, and plans.   Agree with above.  The elevated RF is very concerning for RA or another connective tissues disease.   Normal CK makes dermatomyositis less likely but SLE is still a possibility.  We are awaiting several rheumatoid labs here and a TB quantiferon.  We need to sort out how she will get her medications, as she will need to stay on Plaquenil and azathioprine going forward.  Length of Stay: 4 days   Signed by:  Dorthula Rue. Earlene Plater, MD PGY-I, Internal Medicine Pager [phone]  03/11/2012, 2:08 PM

## 2012-03-12 ENCOUNTER — Encounter (HOSPITAL_COMMUNITY): Payer: Self-pay

## 2012-03-12 DIAGNOSIS — J8410 Pulmonary fibrosis, unspecified: Secondary | ICD-10-CM

## 2012-03-12 DIAGNOSIS — R079 Chest pain, unspecified: Secondary | ICD-10-CM

## 2012-03-12 LAB — BASIC METABOLIC PANEL
BUN: 11 mg/dL (ref 6–23)
CO2: 29 mEq/L (ref 19–32)
Chloride: 105 mEq/L (ref 96–112)
Creatinine, Ser: 0.59 mg/dL (ref 0.50–1.10)
GFR calc Af Amer: >90 mL/min (ref >90)
Glucose, Bld: 85 mg/dL (ref 70–99)

## 2012-03-12 LAB — QUANTIFERON TB GOLD ASSAY (BLOOD)

## 2012-03-12 LAB — CENTROMERE ANTIBODIES: Centromere Ab Screen: 25 AU/mL (ref <30)

## 2012-03-12 NOTE — Progress Notes (Signed)
Medical Student Daily Progress Note  Subjective: Donna Hall is awake and alert. She states that her pain is significantly better than yesterday with only moderate tenderness isolated to her right knee.   Objective: Vital signs in last 24 hours: Filed Vitals:   03/11/12 0626 03/11/12 1453 03/12/12 0612 03/12/12 1500  BP: 114/71 126/73 112/68 129/74  Pulse: 67 65 67 81  Temp: 98.7 F (37.1 C) 98.1 F (36.7 C) 98.4 F (36.9 C) 99.1 F (37.3 C)  TempSrc: Oral Oral Oral Oral  Resp: 18 20 20 20   Height:      Weight:      SpO2: 95% 97% 98% 96%    Physical Exam: BP 129/74  Pulse 81  Temp 99.1 F (37.3 C) (Oral)  Resp 20  Ht 5\' 4"  (1.626 m)  Wt 119.886 kg (264 lb 4.8 oz)  BMI 45.37 kg/m2  SpO2 96%  LMP 02/29/2012  General: Alert, oriented, obese and cushingoid. Sitting upright in no apparent distress.  CV: Normal S1/S2, RRR, no r/m/g  Pulm: Mild crackles in upper lobes, L>R. Unchanged. No increased work of breathing.  Abdomen: Obese. Non-distended, non tender. Normoactive bowel sounds.  Extremities: Warm and well perfused. Radial pulses 2+ bilaterally, DP pulses 1+ bilaterally. Tender only in right knee. No erythema, no swelling appreciated although the hands are generally puffy.  Neuro: Moves all extremities spontaneously. No obvious neurological deficits.   Lab Results: Basic Metabolic Panel:  Lab 03/12/12 4782 03/11/12 0803  NA 140 141  K 3.6 3.3*  CL 105 105  CO2 29 27  GLUCOSE 85 79  BUN 11 17  CREATININE 0.59 0.53  CALCIUM 8.9 8.6  MG -- --  PHOS -- --    CBG:  Lab 03/12/12 0741 03/11/12 0747 03/10/12 0731 03/09/12 0749 03/08/12 0753  GLUCAP 77 79 74 90 85    Medications: I have reviewed the patient's current medications. Scheduled Meds:    . acetaminophen  500 mg Oral Q6H  . azaTHIOprine  50 mg Oral Daily  . cyclobenzaprine  10 mg Oral BID  . docusate sodium  100 mg Oral BID  . enoxaparin (LOVENOX) injection  40 mg Subcutaneous Q24H  .  hydroxychloroquine  200 mg Oral Daily  . naproxen  500 mg Oral BID WC  . pantoprazole  40 mg Oral Q1200  . potassium chloride  40 mEq Oral Once  . predniSONE  60 mg Oral Q breakfast  . sodium chloride  3 mL Intravenous Q12H  . sulfamethoxazole-trimethoprim  1 tablet Oral Q M,W,F   Continuous Infusions:  PRN Meds:.HYDROcodone-acetaminophen, hydrOXYzine, ondansetron (ZOFRAN) IV, ondansetron   Assessment/Plan:  1.Chest pain and shortness of breath. Presented with subjective complaints of chest pain and SOB. She has been oxygenating well and her v/s have been stable throughout the admission. Some crackles in upper lobes, which have improved somewhat since admission. Crackles likely from mild interstitial edema seen on CT. CT is largely unchanged from previous admissions, and infiltrates in lungs appear to be chronic. EKG unremarkable. Cardiac enzymes negative. Has had no arrhythmias on telemetry. No complaints of chest pain or shortness of breath today, 9/17.   2.Acute synovitis, Rheumatoid arthritis; Presentation on admission, with pain and exquisite tenderness of numerous synovial joints, consistent with an acute synovitis. Joints affected at presentation included both shoulders, both elbows, both wrists, PIPs, both knees and both ankles. There is some generalized hand swelling bilaterally. X-rays of hands 03/08/2012 showed periarticular osteopenia, but no erosions. ESR 67, CRP 11.2, AntiCCP  was >300, RF > 600, all highly elevated. The presentation and labs are sufficient to diagnose Rheumatoid Arthritis by EURAR/ACR criteria. ANA and anticentromere antibodies were both negative and C3/C4 were normal making alternative autoimmune diagnoses less likely. Parvo IgG elevated, IgM low, consistent with a remote infection.    As of 9/17, the patient's pain/tenderness is confined to her right knee on a regimen of naproxen, vicodin, azathioprine, and prednisone 60 mg. Patient has been seeing a rheumatologist at  [facility], Dr. Annita Brod, for a suspected connective tissue disease and we will attempt to communicate these results and defer initiation of methotrexate to Dr. Olean Ree.   -Continue Imuran 50mg  daily and Plaquenil 500 mg daily as prescribed prior to admission -Patient on oral prednisone 60mg  daily, will taper back down to 20 mg daily until follow up  -Continue naproxen and vicodin for pain control  -defer initiation of methotrexate to [facility] rheumatologist, patient has appointment with him 04/05/2012 -F/U as outpatient in our clinic  3.Possible Glucose Intolerance: A1C of 5.7 on admission, however CBGs have been normal throughout hospital course. Risk factors to consider, patient's obesity, long term use of steroids, and history of gestational diabetes  - A1C 5.7, suggestive of impaired glucose tolerance, possibly associated with glucocorticoid use  - CBG 79 this morning.   4. Airborne precautions: Because the patient presented with chest pain, shortness of breath, and chronic changes on CT we ordered a quantiferon gold assay to rule out TB. Unfortunately, the quantiferon was inconclusive. According to [facility] medical records, the patient was worked up for TB on her previous admission at [facility] with 3x negative AFB smears 02/13/2012. We also saw a negative PPD on records from that admission, but we did not retain those records. Further, since admission here on 9/12, the patient has shown no signs of active TB infection, despite receiving both azathioprine and prednisone and the patient's shortness of breath and chest pain have resolved. 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   5. Psoriasis : no active lesion noted   6. DVTppx: Lovenox   7. Disposition:  -Plan for discharge tomorrow following meeting with Child psychotherapist.  -Arrange f/u with our outpatient clinic.  -Send  discharge summary to Dr. Olean Ree at [facility].   Principal Problem:  *Rheumatoid arthritis Active Problems:  INTERSTITIAL LUNG DISEASE  DYSPNEA  Psoriasis  Synovitis  Chronic pain disorder  Chronic steroid use   LOS: 5 days   This is a Psychologist, occupational Note.  The care of the patient was discussed with Dr. Earlene Plater and the assessment and plan formulated with their assistance.  Please see their attached note for official documentation of the daily encounter.  PHILLIPPI, JASON E 03/12/2012, 5:11 PM   Addendum to medical student daily progress note:  I have seen the patient and reviewed the daily progress note by Barbara Cower Phillippi and discussed the care of the patient with them. See below for documentation of my findings, assessment, and plans.   Subjective:    Interval Events:  Mild pain in right knee only.    Objective:    Vital Signs:  Above vital signs were reviewed.   Physical Exam: General appearance: alert, cooperative and no distress Resp: rales anterior - left Cardio: regular rate and rhythm GI: soft, non-tender; bowel sounds normal; no masses,  no organomegaly Extremities: no tenderness in wrist, hand, or fingers    Labs:  Above labs and radiographic studies were reviewed.    Assessment/ Plan:    Agree with above plan.  As stated above, clinical history and previous testing at [facility] do not suggest TB and we feel comfortable stopping airborne precautions.  The consolidation on CT is stable and probably represents interstitial disease from RA.  We will d/c home tomorrow on Plaquenil, azathioprine, and prednisone with plans to follow up with [facility] and our clinic.   Length of Stay: 5 days   Signed by:  Dorthula Rue. Earlene Plater, MD PGY-I, Internal Medicine Pager [phone]  03/12/2012, 5:25 PM

## 2012-03-12 NOTE — Progress Notes (Signed)
Name: Donna Hall MRN: [id number] DOB: 03/29/1986    LOS: 5  PULMONARY / CRITICAL CARE MEDICINE   Requesting:  Teaching service  Reason for consult:  Suspected pneumonia  Patient description: 26 yo with SLE on chronic prednisone / plaquenil / imuran, chronic pain syndrome and medical noncompliance admitted 9/12 with one week history of severe generalized pain, bilateral lower extremity edema, pleuritic chest pain, nausea, subjective fevers and dyspnea. CXR demonstrated L hilar infiltrate suspicious for pneumonia and chest CTA was negative for pulmonary embolism.  Interval history:  No acute events overnight.  Complains of joint pain.   BP 112/68  Pulse 67  Temp 98.4 F (36.9 C) (Oral)  Resp 20  Ht 5\' 4"  (1.626 m)  Wt 119.886 kg (264 lb 4.8 oz)  BMI 45.37 kg/m2  SpO2 98%  LMP 02/29/2012  Intake/Output Summary (Last 24 hours) at 03/12/12 1411 Last data filed at 03/12/12 0900  Gross per 24 hour  Intake    240 ml  Output      0 ml  Net    240 ml  room air   Physical examination: Gen:  Comfortable, no distress Neuro:  Awake, alert HEENT:  PERRL Neck:  No JVD Heart:  Regular, no murmurs Chest:  resp's even/non-labored on RA, lungs bilaterally clear Abdomen:  Obese, soft, bowel sounds present Extr:  Trace edema LE bilaterally Skin:  No rash  Labs:  Lab 03/12/12 0635 03/11/12 0803 03/08/12 0552  NA 140 141 138  K 3.6 3.3* 3.6  CL 105 105 105  CO2 29 27 24   BUN 11 17 16   CREATININE 0.59 0.53 0.43*  GLUCOSE 85 79 94    Lab 03/08/12 0552 03/07/12 0551  HGB 11.3* 13.3  HCT 33.3* 39.1  WBC 7.8 14.1*  PLT 355 392     C3 128 C4 26 - wnl  Imaging: No results found.  IMPRESSION/ PLAN: Chronic left perihilar density and Ground Class opacities : no evidence of infection No evidence to suggest active pulmonary TB.  Latent TB unlikely, especially given recent negative PPD Suspected obstructive sleep apnea / obesity hypoventilation syndrome Lupus flare /  acute pneumonitis / acute lupus-associated ILD unlikely given normal C3 and C4 and no active urinary sediment rec  -  Follow up with rheumatology at [facility] to reevaluate the need for immunosuppressive therapy -  Follow up Quantiferon gold for confirmation of PPD results  -  pred taper per #1 service.  -  Full PFT when out of isolation  -  Polysomnography as outpatient -  would not go forward w/ FOB given GO are chronic.   Obesity - per primary SVC  Chronic pain syndrome / Joint Pain - per primary SVC  Medical noncompliance -per primary SVC       We will s/o call PRN  Caryl Bis  [phone]  Cell  [phone]  If no response or cell goes to voicemail, call beeper [phone]

## 2012-03-12 NOTE — Progress Notes (Signed)
Clinical Social Work-CSW contacted interpreting office re: scheduling appointment to review upcoming appointments/transportation assistance as well as any other available resources. Interpreter will be present at 12 for multi disciplinary team- CSW will follow- Jodean Lima, [phone]

## 2012-03-12 NOTE — Care Management Note (Signed)
    Page 1 of 2   03/14/2012     8:03:08 AM   CARE MANAGEMENT NOTE 03/14/2012  Patient:  Donna Hall, Donna Hall   Account Number:  [id number]  Date Initiated:  03/08/2012  Documentation initiated by:  Letha Cape  Subjective/Objective Assessment:   dx lupus, chronic pain  admit- lives with spouse and children. does not speak english, speaks spanish.     Action/Plan:   Anticipated DC Date:  03/13/2012   Anticipated DC Plan:  HOME/SELF CARE  In-house referral  Interpreting Services      DC Planning Services  CM consult  Medication Assistance      Choice offered to / List presented to:             Status of service:  Completed, signed off Medicare Important Message given?   (If response is "NO", the following Medicare IM given date fields will be blank) Date Medicare IM given:   Date Additional Medicare IM given:    Discharge Disposition:  HOME/SELF CARE  Per UR Regulation:  Reviewed for med. necessity/level of care/duration of stay  If discussed at Long Length of Stay Meetings, dates discussed:    Comments:  03/14/12 Obtained bactrim for patient via the pharmacy indigent fund on 03/13/12. Scheduled interpreter for patient's RN to give d/c instructions. Patient discharged home on 03/13/12. Jacquelynn Cree RN, BSN, CCM   03/12/12 15:15 Letha Cape RN, BSN [phone] quantiferin test is indeterminate, ( can't say if it is positive or not) , patient may be dc tomorrow, and will need medication ast, she is eligble.  She will need immunosupresants and sterods 3 day supply.  Patient speaks spanish, will need spanish interpreter.  03/08/12 16:58 Letha Cape RN, BSN [phone] patient lives with spouse and children, patient goes to [facility], they help her with her medications.  Patient is eligible for med ast if needes.

## 2012-03-12 NOTE — Progress Notes (Signed)
Internal Medicine Attending  Date: 03/12/2012  Patient name: Donna Hall Medical record number: [id number] Date of birth: 05-19-1986 Age: 27 y.o. Gender: female  I saw and evaluated the patient. I reviewed the resident's note by Dr. Earlene Plater and I agree with the resident's findings and plans as documented in his progress note.

## 2012-03-13 ENCOUNTER — Inpatient Hospital Stay (HOSPITAL_COMMUNITY): Payer: Medicaid Other

## 2012-03-13 DIAGNOSIS — M069 Rheumatoid arthritis, unspecified: Principal | ICD-10-CM

## 2012-03-13 LAB — CULTURE, BLOOD (ROUTINE X 2): Culture: NO GROWTH

## 2012-03-13 MED ORDER — PREDNISONE 20 MG PO TABS: ORAL_TABLET | ORAL | Status: DC

## 2012-03-13 MED ORDER — SULFAMETHOXAZOLE-TRIMETHOPRIM 800-160 MG PO TABS: 1.0000 | ORAL_TABLET | ORAL | Status: DC

## 2012-03-13 MED ORDER — ALBUTEROL SULFATE (5 MG/ML) 0.5% IN NEBU
2.5000 mg | INHALATION_SOLUTION | Freq: Once | RESPIRATORY_TRACT | Status: AC
  Administered 2012-03-13: 2.5 mg via RESPIRATORY_TRACT

## 2012-03-13 MED ORDER — ACETAMINOPHEN 500 MG PO TABS: 1000.0000 mg | ORAL_TABLET | Freq: Four times a day (QID) | ORAL | Status: DC | PRN

## 2012-03-13 MED ORDER — IBUPROFEN 400 MG PO TABS
400.0000 mg | ORAL_TABLET | Freq: Four times a day (QID) | ORAL | Status: DC | PRN
  Administered 2012-03-13: 400 mg via ORAL
  Filled 2012-03-13 (×3): qty 1

## 2012-03-13 NOTE — Progress Notes (Signed)
PFT completed. Unconfirmed results placed in Progress Notes in Shadow Chart. 

## 2012-03-13 NOTE — Discharge Summary (Signed)
Patient Name:  Donna Hall MRN: [id number]  PCP: No primary provider on file. DOB:  02-Aug-1985       Date of Admission:  03/07/2012  Date of Discharge:  03/13/2012      Attending Physician: Inez Catalina, MD         DISCHARGE DIAGNOSES: 1.   Rheumatoid arthritis:  RF > 600 and CCP > 300.    2.   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 No evidence of infectious process.  3.   Psoriasis:  No active lesions.   DISPOSITION AND FOLLOW-UP: [name] is to follow-up with the listed providers as detailed below, at which time, the following should be addressed:   1. Follow-up visits:  1. [facility]:  Our work-up suggests rheumatoid arthritis rather than MCTD, SLE, or other connective tissues diseases.  CCP and RF are strongly positive.  ANA, centromere antibody, and complement levels were normal.  We kept her on azathioprine, hydroxychloroquine, and prednisone.  We will defer further work-up and initiation of methotrexate to [facility].  2. INTERNAL MEDICINE: 1. SOCIAL:  Address transportation needs so that she can attend her appointment at [facility] in October. 2. RA:  Prednisone will be tapered.  If arthralgias return, we may need to increase prednisone until she can follow-up with [facility].   Follow-up Information    Follow up with Darden Palmer, MD. On 03/22/2012. (INTERNAL MEDICINE - 1:45 PM on Friday, September 27.)    Contact information:   [address] [place] [address] [phone]       Follow up with [name], MD. On 04/05/2012. [facility] - 1:20 PM on Friday, October 11.)    Contact information:   [address] [place] [address] [phone]         Discharge Orders    Future Appointments: Provider: Department: Dept Phone: Center:   03/22/2012 1:45 PM Darden Palmer, MD [facility] [phone] [facility]     Future Orders Please Complete By Expires   Diet - low sodium heart healthy      Increase activity slowly      Call MD for:  temperature >100.4      Call MD for:  severe uncontrolled pain      Call MD for:  difficulty breathing, headache or visual disturbances          DISCHARGE MEDICATIONS:   Medication List     As of 03/13/2012  4:01 PM    TAKE these medications         acetaminophen 500 MG tablet   Commonly known as: TYLENOL   Take 2 tablets (1,000 mg total) by mouth every 6 (six) hours as needed for pain. Do not take > 4,000mg  in 24 hours.      azaTHIOprine 50 MG tablet   Commonly known as: IMURAN   Take by mouth daily.      famotidine 20 MG tablet   Commonly known as: PEPCID   Take 20 mg by mouth 2 (two) times daily.      hydroxychloroquine 200 MG tablet   Commonly known as: PLAQUENIL   Take 200 mg by mouth daily.      hydrOXYzine 25 MG tablet   Commonly known as: ATARAX/VISTARIL   Take 25 mg by mouth 3 (three) times daily as needed.      oxyCODONE 5 MG  immediate release tablet   Commonly known as: Oxy IR/ROXICODONE   Take 5 mg by mouth every 6 (six) hours as needed.      predniSONE 20 MG tablet   Commonly known as: DELTASONE   Take by mouth at breakfast. Take 3 pills (60mg ) for 3 days, then 2 pills (40mg ) for 3 days, then 1 pill (20mg ) for 3 days, then half pill (10mg ) daily until you see [facility] rheumatology.      sulfamethoxazole-trimethoprim 800-160 MG per tablet   Commonly known as: BACTRIM DS,SEPTRA DS   Take 1 tablet by mouth every Monday, Wednesday, and Friday.       CONSULTS:  1.   Pulmonology   PROCEDURES PERFORMED:  Dg Chest 2 View 03/07/2012 FINDINGS: The shallow inspiration.  Borderline heart size and pulmonary vascularity similar previous study.  Suggestion of focal perihilar infiltration vertically on the left which is more prominent than on the prior study.  Changes may represent early pneumonia.  No blunting of costophrenic angles.  No  pneumothorax. Mediastinal contours appear intact. IMPRESSION: Borderline heart size and pulmonary vascularity similar to previous study and possibly related to shallow inspiration.  Left perihilar infiltration may represent early pneumonia.  Ct Head Wo Contrast 03/07/2012  FINDINGS: No acute intracranial hemorrhage.  No focal mass lesion. No CT evidence of acute infarction.   No midline shift or mass effect.  No hydrocephalus.  Basilar cisterns are patent. No acute intracranial hemorrhage.  No focal mass lesion.  No CT evidence of acute infarction.   No midline shift or mass effect.  No hydrocephalus.  Basilar cisterns are patent. IMPRESSION: No acute intracranial findings.  Ct Angio Chest Pe W/cm &/or Wo Cm 03/07/2012   FINDINGS:  No filling defects in the pulmonary arteries to suggest pulmonary emboli.  Heart is borderline in size.  Pulmonary vascular congestion.  Ground-glass opacities throughout the lungs could reflect mild interstitial edema or areas of inflammation/small airways disease.  No pleural effusions.  Previously seen mediastinal adenopathy much improved.  No pathologically enlarged mediastinal lymph nodes currently. Resolution of the previously seen pericardial effusion.  Visualized thyroid and chest wall soft tissues unremarkable. Imaging into the upper abdomen shows no acute findings. IMPRESSION: No filling defects seen to suggest pulmonary embolus.  Very low lung volumes.  Cardiomegaly with vascular congestion and scattered patchy ground-glass opacities, edema versus small airways disease/alveolitis.  Dg Hand Complete Left 03/08/2012 FINDINGS: No evidence of acute, subacute, or healed fractures. Well-preserved joint spaces.  No erosions.  Well-preserved bone mineral density.  Possible periarticular osteopenia as noted on the prior examination, unchanged.  No other intrinsic osseous abnormalities. IMPRESSION: Possible periarticular osteopenia.  Otherwise normal examination. No erosions  to confirm RA or psoriatic arthritis.  Dg Hand Complete Right 03/08/2012   FINDINGS: No evidence of acute, subacute, or healed fractures. Well-preserved joint spaces.  No erosions.  Well-preserved bone mineral density.  Possible periarticular osteopenia as noted on the prior examination, unchanged.  No other intrinsic osseous abnormalities. IMPRESSION: Possible periarticular osteopenia.  Otherwise normal examination. No erosions to confirm RA or psoriatic arthritis.  No significant interval change.     ADMISSION DATA: H&P: This is a 26 year old Hispanic, obese female with past medical history significant for systemic lupus ( Diagnosed 4 years ago), psoriasis and chronic pain syndrome who presented to the emergency room with severe body pain. Patient reports that she has been aching especially her joints since one week and has been experiencing some chest pain and shortness of breath with  exertion. She noted since yesterday the pain is worse that she cannot walk, or do anything to do to the pain. The pain is located in her arms and her legs mainly. She feels that she has severe bone pain. She reports that she can not move her left arm and do anything with her do to the pain. She cannot really describe what the pain feels like but it is 15/10 in severity. She further reports about chest pain with deep inspiration and worsening shortness of breath even with lying. She had similar episodes in the past and reports that she has received some kind of medication which improved her pain but she cannot recall the name. She also endorses fevers but did not take her temperature and chills. She has been experiencing since 2 days of nausea and last night she vomited after eating dinner which was nonbloody nonbilious. She denies any abdominal pain, diarrhea, constipation or urinary symptoms. She denies any rashes, sick contact, recent travel inches in her medication. She reports that after being on prednisone she has  gained a lot of weight.  Of note:  I spoke with Dr. Olean Ree ( rheumatologist from [facility]) reports that patient was admitted in June of this year with similar symptoms including pneumonia. She was evaluated in the clinic in August. At that time he recommended to continue prednisone 20 mg, plaquenil and Imuran. She has significant history of noncompliance due to financial restriction, illiteracy and social situation. She further has history of chronic pain.   Physical Exam: Vitals: Blood pressure 119/70, pulse 97, temperature 97.2 F (36.2 C), temperature source Oral, resp. rate 16, SpO2 97.00%.  Constitutional: Vital signs reviewed. Patient is a obese And very uncomfortable appearing female crying throughout the conversation do to pain but cooperative with exam. Alert and oriented x3.  Face: Moon face like  Mouth: Poor dentition. No erythema or exudates, mucous membrane dry  Eyes: PERRL, EOMI, conjunctivae injected, No scleral icterus.  Neck: Supple  Cardiovascular: Tachycardic but regular , S1 normal, S2 normal, no MRG, pulses symmetric and intact bilaterally  Pulmonary/Chest: Good air movement rhonchi anteriorly. Not able to evaluate posteriorly due to patient's significant pain  Abdominal: Soft. Non-tender,obese, bowel sounds are normal  Musculoskeletal: Significant joint swelling of both hands and feet , tenderness to palpation, mild erythema and decreased range of motion due to pain.  Neurological: A&O x3, not able to access patient's strengths do to significant pain, no focal motor deficit, sensory intact to light touch bilaterally.  Skin: Warm, dry and intact. No rash, cyanosis, or clubbing. Acanthosis nigricans around the neck present   Labs: Basic Metabolic Panel:  [facility]  03/07/12 0551   NA  140   K  3.7   CL  101   CO2  24   GLUCOSE  92   BUN  19   CREATININE  0.89   CALCIUM  9.2   MG  --   PHOS  --    Liver Function Tests:  Basename  03/07/12 0551   AST   29   ALT  17   ALKPHOS  74   BILITOT  0.2*   PROT  8.1   ALBUMIN  3.8    CBC:  Basename  03/07/12 0551   WBC  14.1*   NEUTROABS  9.6*   HGB  13.3   HCT  39.1   MCV  83.2   PLT  392     HOSPITAL COURSE:  1.   Rheumatoid  arthritis:  Presentation on admission, with pain and exquisite tenderness of numerous synovial joints, consistent with an acute synovitis. Joints affected at presentation included both shoulders, both elbows, both wrists, PIPs bilaterally, both knees and both ankles. There is some generalized hand swelling bilaterally. X-rays of hands 03/08/2012 showed periarticular osteopenia, but no erosions. ESR 67, CRP 11.2, AntiCCP was >300 U/mL (reference range <=5), RF > 600 U/mL (reference range <=15), all highly elevated. The presentation and labs are more than sufficient to diagnose Rheumatoid Arthritis by EURAR/ACR criteria. On this visit, ANA and anticentromere antibodies were both negative and C3/C4 were normal making alternative autoimmune diagnoses less likely. Parvo IgG elevated/IgM low, consistent with a remote infection.  At discharge, the patient's pain/tenderness was confined to her right knee.  Patient has been seeing a rheumatologist at [facility], Dr. Annita Brod, for a suspected connective tissue disease and we will attempt to communicate these results and defer initiation of methotrexate to Dr. Olean Ree.  In the meantime, we will keep her on azathioprine and hydroxychloroquine as well as a prednisone taper from 60mg  to 10mg .  2.   Interstitial pneumonitis:  Presented with subjective complaints of chest pain and SOB. She has been oxygenating well and her v/s have been stable throughout the admission. Some crackles in upper lobes, which have improved somewhat since admission. Crackles likely from mild interstitial edema seen on CT. CT is largely unchanged from previous admissions, and infiltrates in lungs appear to be chronic. EKG unremarkable. Cardiac enzymes negative.  Has had no arrhythmias on telemetry.  PFTs performed on day of discharge; results not yet available.  Suspect this is pneumonitis from a connective tissue disease process.  3.   Psoriasis:  Historical diagnosis.  No active lesions here.  Kept on current therapy of azathioprine and hydroxychloroquine.    DISCHARGE DATA: Vital Signs: BP 115/72  Pulse 55  Temp 98.5 F (36.9 C) (Oral)  Resp 20  Ht 5\' 4"  (1.626 m)  Wt 264 lb 4.8 oz (119.886 kg)  BMI 45.37 kg/m2  SpO2 93%  LMP 02/29/2012  Labs:  ANA - negative Centromere antibody - 25 Cyclic Citrullin Peptide > 300 Rheumatoid Factor > 600 C3 Complement - 128 C4 Complement - 26 Parvovirus B19 IgG - 4 Parvovirus B19 IgM - 0.2 Microalbumin-Creatinine ratio - 16.5   Time spent on discharge: 60 minutes   Signed by:  Dorthula Rue. Earlene Plater, MD PGY-I, Internal Medicine  03/13/2012, 4:01 PM

## 2012-03-13 NOTE — Progress Notes (Signed)
Medical Student Daily Progress Note  Subjective:    Donna Hall is awake, alert, and in no apparent distress. Discussion was done with the assistance of an interpreter. Donna Hall only complains of mild pain in her right knee. We thoroughly discussed the diagnosis of rheumatoid arthritis and the need for her to follow up with her rheumatologist at [facility] for management of her disease in the future. We discussed her current medication regimen in detail and we informed the patient that we explained the tapering course of steroids that we will be prescribing. We explained to the patient that she will be discharged today.      Objective: Vital signs in last 24 hours: Filed Vitals:   03/12/12 1500 03/12/12 2045 03/12/12 2101 03/13/12 0544  BP: 129/74  124/78 115/72  Pulse: 81  86 55  Temp: 99.1 F (37.3 C)  98.6 F (37 C) 98.5 F (36.9 C)  TempSrc: Oral Oral Oral Oral  Resp: 20 18 18 20   Height:      Weight:      SpO2: 96%  95% 93%   Physical Exam: BP 115/72  Pulse 55  Temp 98.5 F (36.9 C) (Oral)  Resp 20  Ht 5\' 4"  (1.626 m)  Wt 119.886 kg (264 lb 4.8 oz)  BMI 45.37 kg/m2  SpO2 93%  LMP 02/29/2012 General: 26 year old female who appears stated age. Cushingoid habitus. No apparent distress. Pulm: Persistent mild crackles in RUL. Clear otherwise. CV: Normal S1/S2, RRR, no R/M/G Abdomen: Nondistended, nontender. Obese. Extremities: Tender only in right knee. Warm and well perfused.    CBG:  Lab 03/13/12 0812 03/12/12 0741 03/11/12 0747 03/10/12 0731 03/09/12 0749 03/08/12 0753  GLUCAP 84 77 79 74 90 85   Medications: I have reviewed the patient's current medications. Scheduled Meds:   . acetaminophen  500 mg Oral Q6H  . azaTHIOprine  50 mg Oral Daily  . cyclobenzaprine  10 mg Oral BID  . docusate sodium  100 mg Oral BID  . enoxaparin (LOVENOX) injection  40 mg Subcutaneous Q24H  . hydroxychloroquine  200 mg Oral Daily  . naproxen  500 mg Oral BID WC  . pantoprazole  40  mg Oral Q1200  . predniSONE  60 mg Oral Q breakfast  . sodium chloride  3 mL Intravenous Q12H  . sulfamethoxazole-trimethoprim  1 tablet Oral Q M,W,F   Continuous Infusions:  PRN Meds:.HYDROcodone-acetaminophen, hydrOXYzine, ibuprofen, ondansetron (ZOFRAN) IV, ondansetron  Assessment/Plan:  1.Chest pain and shortness of breath. Presented with subjective complaints of chest pain and SOB. She has been oxygenating well and her v/s have been stable throughout the admission. Some crackles in upper lobes, which have improved somewhat since admission. Crackles likely from mild interstitial edema seen on CT. CT is largely unchanged from previous admissions, and infiltrates in lungs appear to be chronic. EKG unremarkable. Cardiac enzymes negative. Has had no arrhythmias on telemetry. No complaints of chest pain or shortness of breath today, 9/18.  -PFTs this afternoon prior to d/c   2.Acute synovitis, Rheumatoid arthritis; Presentation on admission, with pain and exquisite tenderness of numerous synovial joints, consistent with an acute synovitis. Joints affected at presentation included both shoulders, both elbows, both wrists, PIPs bilaterally, both knees and both ankles. There is some generalized hand swelling bilaterally. X-rays of hands 03/08/2012 showed periarticular osteopenia, but no erosions. ESR 67, CRP 11.2, AntiCCP was >300 U/mL (reference range <=5), RF > 600 U/mL (reference range <=15), all highly elevated. The presentation and labs are  more than sufficient to diagnose Rheumatoid Arthritis by EURAR/ACR criteria. On this visit, ANA and anticentromere antibodies were both negative and C3/C4 were normal making alternative autoimmune diagnoses less likely. Parvo IgG elevated/IgM low, consistent with a remote infection.    Labs done at [facility] in the past, by report, were significant for positive ANA, positive Anti-SSA, and positive anti-dsDNA in the past. It is possible that some of these labs could be  explained by RA, but it remains possible that the patient has concomitant connective tissue disease in addition to RA. We will differ judgement on this matter to the patient's rheumatologist.   As of 9/18, the patient's pain/tenderness is confined to her right knee on a regimen of naproxen, vicodin, azathioprine, and prednisone 60 mg. Patient has been seeing a rheumatologist at [facility], Dr. Annita Brod, for a suspected connective tissue disease and we will attempt to communicate these results and defer initiation of methotrexate to Dr. Olean Ree.   -Continue Imuran 50mg  daily and Plaquenil 500 mg daily as prescribed prior to admission  -Patient on oral prednisone 60mg  daily, 3 day taper (60, 40, 20, 10) down to 10, stay at 10 mg until rheumatology F/U  -Continue at home percocet for pain control, not providing a new prescription on discharge -defer initiation of methotrexate to [facility] rheumatologist, appointment with Dr. Olean Ree 1:20 pm on 04/05/2012  -F/U as outpatient in our clinic within 2 weeks  3.Possible Glucose Intolerance: A1C of 5.7 on admission, however CBGs have been normal throughout hospital course. Risk factors to consider, patient's obesity, long term use of steroids, and history of gestational diabetes  - A1C 5.7, suggestive of impaired glucose tolerance, possibly associated with glucocorticoid use  - CBG 79 this morning.   4. Airborne precautions: Because the patient presented with chest pain, shortness of breath, and chronic changes on CT we ordered a quantiferon gold assay to rule out TB. Unfortunately, the quantiferon was inconclusive. According to [facility] medical records, the patient was worked up for TB on her previous admission at [facility] with 3x negative AFB smears 02/13/2012. We also saw a negative PPD on records from that admission, but we did not retain those records. Further, since admission here on 9/12, the patient has shown no signs of active TB infection, despite receiving  both azathioprine and prednisone and the patient's shortness of breath and chest pain have resolved. We are comfortable concluding that this patient is very unlikely to have tuberculosis, but rather chronic interstitial disease either from RA or other connective tissue disease; we have discontinued airborne precautions.   5. Psoriasis : no active lesion noted.   6. DVTppx: Lovenox   7. Disposition:  -Plan for dispo this afternoon after PFTs  -Arrange f/u with our outpatient clinic.  -Send discharge summary to Dr. Olean Ree at [facility]. -only needs prescriptions for prednisone and bactrim Patient has followup with Dr. Annita Brod on 04/05/2012 at 1:20 PM. Phone number is [phone]. Fax number is [phone]    Principal Problem:  *Rheumatoid arthritis Active Problems:  INTERSTITIAL LUNG DISEASE  DYSPNEA  Psoriasis  Synovitis  Chronic pain disorder  Chronic steroid use   LOS: 6 days   This is a Psychologist, occupational Note.  The care of the patient was discussed with Dr. Earlene Plater and the assessment and plan formulated with their assistance.  Please see their attached note for official documentation of the daily encounter.  PHILLIPPI, JASON E 03/13/2012, 12:52 PM   Addendum to medical student daily progress note:  I  have seen the patient and reviewed the daily progress note by Barbara Cower Phillippi and discussed the care of the patient with them. See below for documentation of my findings, assessment, and plans.   Subjective:    Interval Events:  In addition to above, patient endorses mild pain in right knee only.    Objective:    Vital Signs:  Above vital signs were reviewed.   Physical Exam: General appearance: alert, cooperative and no distress Resp: rales anterior - left Cardio: regular rate and rhythm, S1, S2 normal, no murmur, click, rub or gallop Extremities: pain with palpation of right knee    Labs:  Above labs and radiographic studies were  reviewed.    Assessment/ Plan:     Agree with above A&P.  Believe this to be RA.  Probably needs to be on methotrexate, but will defer to [facility] rheum.  Home on azathioprine, hydroxychloroquine, and prednisone taper.     Length of Stay: 6 days   Signed by:  Dorthula Rue. Earlene Plater, MD PGY-I, Internal Medicine Pager [phone]  03/13/2012, 5:33 PM

## 2012-03-13 NOTE — Progress Notes (Signed)
Clinical Social Work-CSW spoke with financial counseling who confirmed that pt only eligible for emergency medicaid which in this case will only cover ED visit. CSW contacted Financial risk analyst and initiated referral for community case management; faith action will provide as many resources as are available to pt with regards to transportation, medication assistance and other community resources. CSW has notified treatment team and will notify interpreter- Jodean Lima, [phone]

## 2012-03-13 NOTE — Progress Notes (Signed)
In pt's room passing meds. Pt reported IV had come out and she threw it away. Confirmed no IV access and saw IV catheter in trash can. Pt to be d/c'd today. Called Dr. Earlene Plater and confirmed IV was not to be restarted. Waiting for pt's discharge. No other pt complaints or needs at this time. Will continue to monitor. - Christell Faith, RN

## 2012-03-14 ENCOUNTER — Ambulatory Visit: Payer: Self-pay | Admitting: Internal Medicine

## 2012-03-22 ENCOUNTER — Inpatient Hospital Stay (HOSPITAL_COMMUNITY)
Admission: EM | Admit: 2012-03-22 | Discharge: 2012-03-23 | DRG: 546 | Disposition: A | Discharge status: Home / Self Care | Payer: Medicaid Other | Attending: Internal Medicine | Admitting: Internal Medicine

## 2012-03-22 ENCOUNTER — Encounter (HOSPITAL_COMMUNITY): Payer: Self-pay | Admitting: *Deleted

## 2012-03-22 ENCOUNTER — Emergency Department (HOSPITAL_COMMUNITY): Payer: Medicaid Other

## 2012-03-22 ENCOUNTER — Ambulatory Visit: Payer: Self-pay | Admitting: Internal Medicine

## 2012-03-22 DIAGNOSIS — L408 Other psoriasis: Secondary | ICD-10-CM | POA: Diagnosis present

## 2012-03-22 DIAGNOSIS — E66813 Obesity, class 3: Secondary | ICD-10-CM | POA: Diagnosis present

## 2012-03-22 DIAGNOSIS — G894 Chronic pain syndrome: Secondary | ICD-10-CM | POA: Diagnosis present

## 2012-03-22 DIAGNOSIS — I10 Essential (primary) hypertension: Secondary | ICD-10-CM | POA: Diagnosis present

## 2012-03-22 DIAGNOSIS — Z79899 Other long term (current) drug therapy: Secondary | ICD-10-CM

## 2012-03-22 DIAGNOSIS — L409 Psoriasis, unspecified: Secondary | ICD-10-CM | POA: Diagnosis present

## 2012-03-22 DIAGNOSIS — M659 Unspecified synovitis and tenosynovitis, unspecified site: Secondary | ICD-10-CM | POA: Diagnosis present

## 2012-03-22 DIAGNOSIS — M069 Rheumatoid arthritis, unspecified: Principal | ICD-10-CM | POA: Diagnosis present

## 2012-03-22 DIAGNOSIS — Z6841 Body Mass Index (BMI) 40.0 and over, adult: Secondary | ICD-10-CM

## 2012-03-22 DIAGNOSIS — R000 Tachycardia, unspecified: Secondary | ICD-10-CM | POA: Diagnosis present

## 2012-03-22 DIAGNOSIS — IMO0002 Reserved for concepts with insufficient information to code with codable children: Secondary | ICD-10-CM

## 2012-03-22 DIAGNOSIS — M329 Systemic lupus erythematosus, unspecified: Secondary | ICD-10-CM | POA: Diagnosis present

## 2012-03-22 LAB — COMPREHENSIVE METABOLIC PANEL
ALT: 25 U/L (ref 0–35)
Alkaline Phosphatase: 57 U/L (ref 39–117)
BUN: 6 mg/dL (ref 6–23)
CO2: 26 mEq/L (ref 19–32)
Calcium: 9 mg/dL (ref 8.4–10.5)
GFR calc Af Amer: >90 mL/min (ref >90)
GFR calc non Af Amer: >90 mL/min (ref >90)
Glucose, Bld: 112 mg/dL — ABNORMAL HIGH (ref 70–99)
Total Protein: 6.9 g/dL (ref 6.0–8.3)

## 2012-03-22 LAB — URINALYSIS, ROUTINE W REFLEX MICROSCOPIC
Bilirubin Urine: NEGATIVE
Hgb urine dipstick: NEGATIVE
Nitrite: NEGATIVE
Protein, ur: NEGATIVE mg/dL
Specific Gravity, Urine: 1.01 (ref 1.005–1.030)
Urobilinogen, UA: 0.2 mg/dL (ref 0.0–1.0)

## 2012-03-22 LAB — CBC WITH DIFFERENTIAL/PLATELET
Eosinophils Absolute: 0.1 10*3/uL (ref 0.0–0.7)
Eosinophils Relative: 1 % (ref 0–5)
HCT: 37.9 % (ref 36.0–46.0)
Hemoglobin: 12.9 g/dL (ref 12.0–15.0)
Lymphocytes Relative: 22 % (ref 12–46)
Lymphs Abs: 2.2 10*3/uL (ref 0.7–4.0)
MCH: 29.1 pg (ref 26.0–34.0)
MCV: 85.4 fL (ref 78.0–100.0)
Monocytes Absolute: 0.8 10*3/uL (ref 0.1–1.0)
Monocytes Relative: 8 % (ref 3–12)
RBC: 4.44 MIL/uL (ref 3.87–5.11)
WBC: 10 10*3/uL (ref 4.0–10.5)

## 2012-03-22 LAB — LIPASE, BLOOD: Lipase: 27 U/L (ref 11–59)

## 2012-03-22 MED ORDER — SULFAMETHOXAZOLE-TRIMETHOPRIM 800-160 MG PO TABS: 1.0000 | ORAL_TABLET | ORAL | Status: DC

## 2012-03-22 MED ORDER — SULFAMETHOXAZOLE-TMP DS 800-160 MG PO TABS
1.0000 | ORAL_TABLET | ORAL | Status: DC
  Administered 2012-03-22: 1 via ORAL
  Filled 2012-03-22: qty 1

## 2012-03-22 MED ORDER — ACETAMINOPHEN 325 MG PO TABS
650.0000 mg | ORAL_TABLET | Freq: Once | ORAL | Status: AC
  Administered 2012-03-22: 650 mg via ORAL
  Filled 2012-03-22: qty 2

## 2012-03-22 MED ORDER — HYDROMORPHONE HCL PF 1 MG/ML IJ SOLN
0.5000 mg | Freq: Once | INTRAMUSCULAR | Status: AC
  Administered 2012-03-22: 0.5 mg via INTRAVENOUS
  Filled 2012-03-22: qty 1

## 2012-03-22 MED ORDER — ENOXAPARIN SODIUM 40 MG/0.4ML ~~LOC~~ SOLN
40.0000 mg | SUBCUTANEOUS | Status: DC
  Administered 2012-03-23: 40 mg via SUBCUTANEOUS
  Filled 2012-03-22 (×3): qty 0.4

## 2012-03-22 MED ORDER — SODIUM CHLORIDE 0.9 % IV BOLUS (SEPSIS)
1000.0000 mL | Freq: Once | INTRAVENOUS | Status: AC
  Administered 2012-03-22: 1000 mL via INTRAVENOUS

## 2012-03-22 MED ORDER — HYDROMORPHONE HCL PF 2 MG/ML IJ SOLN: 2.0000 mg | Freq: Once | INTRAMUSCULAR | Status: DC

## 2012-03-22 MED ORDER — PREDNISONE 10 MG PO TABS
60.0000 mg | ORAL_TABLET | Freq: Every day | ORAL | Status: DC
  Filled 2012-03-22: qty 1

## 2012-03-22 MED ORDER — FAMOTIDINE 20 MG PO TABS
20.0000 mg | ORAL_TABLET | Freq: Two times a day (BID) | ORAL | Status: DC
  Administered 2012-03-22: 20 mg via ORAL
  Filled 2012-03-22 (×2): qty 1

## 2012-03-22 MED ORDER — PREDNISONE 50 MG PO TABS
60.0000 mg | ORAL_TABLET | Freq: Every day | ORAL | Status: DC
  Administered 2012-03-23: 60 mg via ORAL
  Filled 2012-03-22 (×2): qty 1

## 2012-03-22 MED ORDER — ONDANSETRON HCL 4 MG PO TABS: 4.0000 mg | ORAL_TABLET | Freq: Four times a day (QID) | ORAL | Status: DC | PRN

## 2012-03-22 MED ORDER — SODIUM CHLORIDE 0.9 % IV SOLN
INTRAVENOUS | Status: DC
  Administered 2012-03-22: 09:00:00 via INTRAVENOUS
  Administered 2012-03-22 – 2012-03-23 (×2): 1000 mL via INTRAVENOUS

## 2012-03-22 MED ORDER — AZATHIOPRINE 50 MG PO TABS
50.0000 mg | ORAL_TABLET | Freq: Every day | ORAL | Status: DC
  Administered 2012-03-22 – 2012-03-23 (×2): 50 mg via ORAL
  Filled 2012-03-22 (×2): qty 1

## 2012-03-22 MED ORDER — HYDROXYZINE HCL 25 MG PO TABS
25.0000 mg | ORAL_TABLET | Freq: Three times a day (TID) | ORAL | Status: DC | PRN
  Filled 2012-03-22: qty 1

## 2012-03-22 MED ORDER — PREDNISONE 50 MG PO TABS
60.0000 mg | ORAL_TABLET | Freq: Every day | ORAL | Status: AC
  Administered 2012-03-22: 60 mg via ORAL
  Filled 2012-03-22: qty 1

## 2012-03-22 MED ORDER — HYDROXYCHLOROQUINE SULFATE 200 MG PO TABS
200.0000 mg | ORAL_TABLET | Freq: Every day | ORAL | Status: DC
  Administered 2012-03-22 – 2012-03-23 (×2): 200 mg via ORAL
  Filled 2012-03-22 (×2): qty 1

## 2012-03-22 MED ORDER — KETOROLAC TROMETHAMINE 30 MG/ML IJ SOLN
30.0000 mg | Freq: Four times a day (QID) | INTRAMUSCULAR | Status: AC
  Administered 2012-03-22 – 2012-03-23 (×6): 30 mg via INTRAVENOUS
  Filled 2012-03-22 (×8): qty 1

## 2012-03-22 MED ORDER — ACETAMINOPHEN 500 MG PO TABS
1000.0000 mg | ORAL_TABLET | Freq: Four times a day (QID) | ORAL | Status: DC
  Administered 2012-03-22 – 2012-03-23 (×5): 1000 mg via ORAL
  Filled 2012-03-22 (×8): qty 2

## 2012-03-22 MED ORDER — HYDROMORPHONE HCL PF 1 MG/ML IJ SOLN
1.0000 mg | INTRAMUSCULAR | Status: DC | PRN
  Administered 2012-03-22: 1 mg via INTRAVENOUS
  Filled 2012-03-22: qty 1

## 2012-03-22 MED ORDER — PANTOPRAZOLE SODIUM 40 MG PO TBEC
40.0000 mg | DELAYED_RELEASE_TABLET | Freq: Every day | ORAL | Status: DC
  Administered 2012-03-22 – 2012-03-23 (×2): 40 mg via ORAL
  Filled 2012-03-22 (×2): qty 1

## 2012-03-22 MED ORDER — ACETAMINOPHEN 500 MG PO TABS
1000.0000 mg | ORAL_TABLET | Freq: Four times a day (QID) | ORAL | Status: DC | PRN
  Administered 2012-03-22: 1000 mg via ORAL
  Filled 2012-03-22: qty 2

## 2012-03-22 MED ORDER — ONDANSETRON HCL 4 MG/2ML IJ SOLN: 4.0000 mg | Freq: Four times a day (QID) | INTRAMUSCULAR | Status: DC | PRN

## 2012-03-22 MED ORDER — PREDNISONE 20 MG PO TABS
60.0000 mg | ORAL_TABLET | Freq: Once | ORAL | Status: AC
  Administered 2012-03-22: 60 mg via ORAL
  Filled 2012-03-22: qty 3

## 2012-03-22 MED ORDER — MORPHINE SULFATE 2 MG/ML IJ SOLN
2.0000 mg | INTRAMUSCULAR | Status: DC | PRN
  Administered 2012-03-22: 4 mg via INTRAVENOUS
  Administered 2012-03-22 – 2012-03-23 (×3): 2 mg via INTRAVENOUS
  Filled 2012-03-22: qty 1
  Filled 2012-03-22: qty 2
  Filled 2012-03-22 (×2): qty 1

## 2012-03-22 NOTE — Care Management Note (Unsigned)
    Page 1 of 1   03/22/2012     12:48:12 PM   CARE MANAGEMENT NOTE 03/22/2012  Patient:  Donna Hall, Donna Hall   Account Number:  [id number]  Date Initiated:  03/22/2012  Documentation initiated by:  SIMMONS,Wilman Tucker  Subjective/Objective Assessment:   ADMITTED WITH SEVERE PAIN; LIVES AT HOME WIT HHUSBAND AND KIDS; WAS IPTA; SPEAKS SPANISH.     Action/Plan:   DISCHARGE PLANNING INITIATED. PT IS NOT ELIGIBLE FOR [facility] FUND.   Anticipated DC Date:  03/23/2012   Anticipated DC Plan:  HOME/SELF CARE      DC Planning Services  CM consult      Choice offered to / List presented to:             Status of service:  In process, will continue to follow Medicare Important Message given?   (If response is "NO", the following Medicare IM given date fields will be blank) Date Medicare IM given:   Date Additional Medicare IM given:    Discharge Disposition:    Per UR Regulation:  Reviewed for med. necessity/level of care/duration of stay  If discussed at Long Length of Stay Meetings, dates discussed:    Comments:  03/22/12  1053  Skyla Champagne SIMMONS RN, BSN [id number] PT IS NOT ELIGIBLE FOR MEDICATION ASSISTANCE AS SHE JUST RECEIVED MEDS FROM HERE ON 03/14/12; PREDNISONE IS ON THE [facility] $4 LIST; NCM WILL FOLLOW.

## 2012-03-22 NOTE — ED Notes (Signed)
Pt unable to void at this time. 

## 2012-03-22 NOTE — H&P (Signed)
Internal Medicine Teaching Service Attending Note Date: 03/22/2012  Patient name: Donna Hall  Medical record number: [id number]  Date of birth: [birth year]   I have seen and evaluated Donna Hall and discussed their care with the Residency Team.    [name] is a well known patient to our team.  She was previously admitted earlier this month for diffuse arthralgias, synovitis and a chronic dry cough with CXR changes.  She was diagnosed with rheumatoid arthritis, with an RF and anti CCP highly positive.  She was discharged on a prednisone taper, plaquenil and azathioprine with plan for follow up with her Rheumatologist at [facility].  MTX was not able to be started in the inpatient setting, and it is more appropriate for her Rheumatologist to start this medication.   [name] presented this time with diffuse arthralgias and body aches.  She reports not taking her prednisone for 5 days, as her previous prescription ran out and she could not afford to fill the prescription for the tapering dose.  Her symptoms include arthralgias in her knees, shoulders and hands and also bone pain.  She has also had a subjective fever, headache and retching.  Further symptoms include a non productive cough.  She denies SOB, skin change, dysuria.   PMH/PSH/PFSH/All/Meds/ROS per resident note  Physical Exam: Blood pressure 109/73, pulse 95, temperature 98.6 F (37 C), temperature source Oral, resp. rate 20, weight 276 lb 7.3 oz (125.4 kg), last menstrual period 02/29/2012, SpO2 98.00%. General appearance: alert, cooperative, appears stated age and mild distress - she is moving very little in the bed to avoid further exacerbating her joint pain Head: Normocephalic, without obvious abnormality, atraumatic Eyes: no icterus, EOMI Lungs: clear to auscultation bilaterally and no wheeze noted Heart: tachycardic mildly with a normal S1 and S2, no murmur or rub Abdomen: soft, NT, ND, +BS Extremities: warm,  dry, diffusely tender at the knees, joints of the arms and the joints of the hands, + synovitis of the small joints of the hands.  Skin: Skin color, texture, turgor normal. No rashes or lesions  Lab results: Results for orders placed during the hospital encounter of 03/22/12 (from the past 24 hour(s))  CBC WITH DIFFERENTIAL     Status: Normal   Collection Time   03/22/12  1:04 AM      Component Value Range   WBC 10.0  4.0 - 10.5 K/uL   RBC 4.44  3.87 - 5.11 MIL/uL   Hemoglobin 12.9  12.0 - 15.0 g/dL   HCT 30.8  65.7 - 84.6 %   MCV 85.4  78.0 - 100.0 fL   MCH 29.1  26.0 - 34.0 pg   MCHC 34.0  30.0 - 36.0 g/dL   RDW 96.2  95.2 - 84.1 %   Platelets 281  150 - 400 K/uL   Neutrophils Relative 68  43 - 77 %   Neutro Abs 6.8  1.7 - 7.7 K/uL   Lymphocytes Relative 22  12 - 46 %   Lymphs Abs 2.2  0.7 - 4.0 K/uL   Monocytes Relative 8  3 - 12 %   Monocytes Absolute 0.8  0.1 - 1.0 K/uL   Eosinophils Relative 1  0 - 5 %   Eosinophils Absolute 0.1  0.0 - 0.7 K/uL   Basophils Relative 0  0 - 1 %   Basophils Absolute 0.0  0.0 - 0.1 K/uL  COMPREHENSIVE METABOLIC PANEL     Status: Abnormal   Collection Time  03/22/12  1:04 AM      Component Value Range   Sodium 135  135 - 145 mEq/L   Potassium 3.3 (*) 3.5 - 5.1 mEq/L   Chloride 98  96 - 112 mEq/L   CO2 26  19 - 32 mEq/L   Glucose, Bld 112 (*) 70 - 99 mg/dL   BUN 6  6 - 23 mg/dL   Creatinine, Ser 1.61  0.50 - 1.10 mg/dL   Calcium 9.0  8.4 - 09.6 mg/dL   Total Protein 6.9  6.0 - 8.3 g/dL   Albumin 3.5  3.5 - 5.2 g/dL   AST 36  0 - 37 U/L   ALT 25  0 - 35 U/L   Alkaline Phosphatase 57  39 - 117 U/L   Total Bilirubin 0.4  0.3 - 1.2 mg/dL   GFR calc non Af Amer >90  >90 mL/min   GFR calc Af Amer >90  >90 mL/min  LIPASE, BLOOD     Status: Normal   Collection Time   03/22/12  1:04 AM      Component Value Range   Lipase 27  11 - 59 U/L  URINALYSIS, ROUTINE W REFLEX MICROSCOPIC     Status: Normal   Collection Time   03/22/12  9:26 AM       Component Value Range   Color, Urine YELLOW  YELLOW   APPearance CLEAR  CLEAR   Specific Gravity, Urine 1.010  1.005 - 1.030   pH 7.5  5.0 - 8.0   Glucose, UA NEGATIVE  NEGATIVE mg/dL   Hgb urine dipstick NEGATIVE  NEGATIVE   Bilirubin Urine NEGATIVE  NEGATIVE   Ketones, ur NEGATIVE  NEGATIVE mg/dL   Protein, ur NEGATIVE  NEGATIVE mg/dL   Urobilinogen, UA 0.2  0.0 - 1.0 mg/dL   Nitrite NEGATIVE  NEGATIVE   Leukocytes, UA NEGATIVE  NEGATIVE    Imaging results:  Dg Chest 2 View  03/22/2012  *RADIOLOGY REPORT*  Clinical Data: Generalized body pain.  Nausea.  No vomiting.  CHEST - 2 VIEW  Comparison: 03/07/2012.  Findings: Low volume chest.  Perihilar density is present bilaterally, slightly more prominent than on prior exams.     This is probably secondary to the low lung volumes.  Lung volumes are markedly low on the lateral view.  No focal consolidation. Prominent pulmonary vascularity likely secondary to volumes.  IMPRESSION: Low volume chest.  Consider repeat PA and lateral with full inspiration when the patient will tolerate.  No gross acute cardiopulmonary disease.   Original Report Authenticated By: Andreas Newport, M.D.     Assessment and Plan: I agree with the formulated Assessment and Plan with the following changes:   1. RA flare with acute arthralgias  - Restart prednisone at previous dose, 60 mg/day.  Take this until joint pain improves and then restart taper - no changes to medications at this time, as MTX (first line therapy for RA) cannot be started in the inpatient setting, unless under the guidance of a Rheumatologist.  I also think it would be unsafe to start this medication without appropriate follow up with a Rheumatologist.  Will defer until her appointment at [facility] on 10/11.  - Assistance with transportation to [facility] was provided at last hospital stay, will follow up and make sure this is still a viable option - CSW and CM assistance for drug procurement and  transportation - D/C telemetry - Up ad lib - BC drawn in ED, will follow.  Do not feel this represents an infection - Pain control  Other problems as per resident note  Inez Catalina, MD 9/27/201310:12 AM

## 2012-03-22 NOTE — ED Notes (Signed)
Attempted to establish IV access, x 2 and unsuccessful. Pt tolerated procedure well, IV team paged and MD made aware. Will continue to monitor pt.

## 2012-03-22 NOTE — ED Notes (Signed)
Pt c/o generalized body pain that began in legs, arms, head.  Pain 10/10 now.  Nausea, no vomiting.  Also states she has had fevers since yesterday.  Has taken oxycodone for pain with no relief

## 2012-03-22 NOTE — ED Notes (Signed)
IV Team at bedside 

## 2012-03-22 NOTE — Plan of Care (Signed)
Problem: Consults Goal: General Medical Patient Education See Patient Education Module for specific education. Outcome: Not Met (add Reason) Patient speaks no english

## 2012-03-22 NOTE — ED Provider Notes (Addendum)
History     CSN: [id number]  Arrival date & time 03/22/12  0013   First MD Initiated Contact with Patient 03/22/12 0135      Chief Complaint  Patient presents with  . Generalized Body Aches  . Nausea    (Consider location/radiation/quality/duration/timing/severity/associated sxs/prior treatment) HPI 26 year old female presents to emergency department complaining of diffuse joint pain, chills, subjective fever, headache and nausea without vomiting. Patient has taken oxycodone without improvement in pain. Patient recently admitted for similar presentation from 22 to the 18th. Patient was diagnosed with rheumatoid arthritis. Per discharge summary, patient had followup later today in the outpatient clinic, also was to followup with [facility]. She was to slowly taper off of the prednisone. She reports she did not fill the prednisone, and ran out 5 days ago. She thinks at that time, she was taking 3 tablets at a time. Patient does not speak English well, history taken through telephone translator. Patient felt hot all over but has not taken her temperature at home. She denies any cough, no URI symptoms, no dysuria, no abdominal pain. She's had nausea but no vomiting. She has stopped eating due to the nausea.  Past Medical History  Diagnosis Date  . Lupus (systemic lupus erythematosus)   . Psoriasis   . Gestational diabetes   . Hypertension     History reviewed. No pertinent past surgical history.  Family History  Problem Relation Age of Onset  . Diabetes Mother   . Hypertension Mother     History  Substance Use Topics  . Smoking status: Never Smoker   . Smokeless tobacco: Not on file  . Alcohol Use: No    OB History    Grav Para Term Preterm Abortions TAB SAB Ect Mult Living                  Review of Systems  Unable to perform ROS  secondary to language barrier  Allergies  Review of patient's allergies indicates no known allergies.  Home Medications   Current Outpatient  Rx  Name Route Sig Dispense Refill  . ACETAMINOPHEN 500 MG PO TABS Oral Take 2 tablets (1,000 mg total) by mouth every 6 (six) hours as needed for pain. Do not take > 4,000mg  in 24 hours.    . AZATHIOPRINE 50 MG PO TABS Oral Take by mouth daily.    Marland Kitchen FAMOTIDINE 20 MG PO TABS Oral Take 20 mg by mouth 2 (two) times daily.    Marland Kitchen HYDROXYCHLOROQUINE SULFATE 200 MG PO TABS Oral Take 200 mg by mouth daily.    Marland Kitchen HYDROXYZINE HCL 25 MG PO TABS Oral Take 25 mg by mouth 3 (three) times daily as needed.    . OXYCODONE HCL 5 MG PO TABS Oral Take 5 mg by mouth every 6 (six) hours as needed.    Marland Kitchen PREDNISONE 20 MG PO TABS  Take by mouth at breakfast. Take 3 pills (60mg ) for 3 days, then 2 pills (40mg ) for 3 days, then 1 pill (20mg ) for 3 days, then half pill (10mg ) daily until you see [facility] rheumatology. 30 tablet 0  . SULFAMETHOXAZOLE-TRIMETHOPRIM 800-160 MG PO TABS Oral Take 1 tablet by mouth every Monday, Wednesday, and Friday. 30 tablet 0    BP 128/78  Pulse 111  Temp 99.7 F (37.6 C) (Oral)  Resp 20  SpO2 95%  LMP 02/29/2012  Physical Exam  Nursing note and vitals reviewed. Constitutional: She is oriented to person, place, and time. She appears distressed.  Distressed with significant pain noted, patient with cushingoid appearance, obesity  HENT:  Head: Normocephalic and atraumatic.  Right Ear: External ear normal.  Left Ear: External ear normal.  Nose: Nose normal.  Mouth/Throat: Oropharynx is clear and moist.  Eyes: Conjunctivae normal and EOM are normal. Pupils are equal, round, and reactive to light.  Neck: Normal range of motion. Neck supple. No JVD present. No tracheal deviation present. No thyromegaly present.  Cardiovascular: Regular rhythm, normal heart sounds and intact distal pulses.  Exam reveals no gallop and no friction rub.   No murmur heard.      Tachycardia noted  Pulmonary/Chest: Effort normal and breath sounds normal. No stridor. No respiratory distress. She has no  wheezes. She has no rales. She exhibits no tenderness.  Abdominal: Soft. Bowel sounds are normal. She exhibits no distension and no mass. There is no tenderness. There is no rebound and no guarding.  Musculoskeletal: She exhibits tenderness (tenderness over all joints both with palpation and with any attempted range of motion). She exhibits no edema.  Lymphadenopathy:    She has no cervical adenopathy.  Neurological: She is alert and oriented to person, place, and time.  Skin: Skin is warm and dry. No rash noted. She is not diaphoretic. No erythema. No pallor.    ED Course  Procedures (including critical care time)  Labs Reviewed  COMPREHENSIVE METABOLIC PANEL - Abnormal; Notable for the following:    Potassium 3.3 (*)     Glucose, Bld 112 (*)     All other components within normal limits  CBC WITH DIFFERENTIAL  LIPASE, BLOOD  URINALYSIS, ROUTINE W REFLEX MICROSCOPIC  CULTURE, BLOOD (ROUTINE X 2)  CULTURE, BLOOD (ROUTINE X 2)   Dg Chest 2 View  03/22/2012  *RADIOLOGY REPORT*  Clinical Data: Generalized body pain.  Nausea.  No vomiting.  CHEST - 2 VIEW  Comparison: 03/07/2012.  Findings: Low volume chest.  Perihilar density is present bilaterally, slightly more prominent than on prior exams.     This is probably secondary to the low lung volumes.  Lung volumes are markedly low on the lateral view.  No focal consolidation. Prominent pulmonary vascularity likely secondary to volumes.  IMPRESSION: Low volume chest.  Consider repeat PA and lateral with full inspiration when the patient will tolerate.  No gross acute cardiopulmonary disease.   Original Report Authenticated By: Andreas Newport, M.D.      1. Rheumatoid arthritis   2. Chronic pain disorder       MDM  26 year old female who presents with polyarthralgia, nausea, subjective fevers. Will get workup for possible infectious source given immunocompromise. Suspect symptoms are secondary to abrupt stoppage of steroids. We'll attempt  to treat symptoms with steroids, pain medicine, fluids. Suspect patient may require admission back to the hospital for pain control until her steroid levels are back to her baseline       Olivia Mackie, MD 03/22/12 [phone]  Patient reports no improvement in pain after first round of Dilaudid, patient with slight drop in her oxygen saturation. We'll place on nasal cannula, repeat  dose. I have discussed case with outpatient clinics for their evaluation for admission.  Olivia Mackie, MD 03/22/12 [phone]

## 2012-03-22 NOTE — H&P (Signed)
Hospital Admission Note Date: 03/22/2012  Patient name: Donna Hall Medical record number: [id number] Date of birth: 11-04-1985 Age: 26 y.o. Gender: female PCP: Pcp Not In System  Internal Medicine Teaching Service  Attending physician:  Dr. Criselda Peaches    Internal Medicine Teaching Service Contact Information  Internal Medicine Teaching Service Contact Information  1st Contact: Dr. Earlene Plater Pager:[phone] 2nd Contact: Dr. Dierdre Searles Pager:[phone]  After 5 pm or weekends: 1st Contact: Pager: [phone] 2nd Contact: Pager: [phone]   Chief Complaint: pain all over  History of Present Illness:  Donna Hall is a 26 year old female with history of unclear rheumatologic disorder (RA vs SLE), chronic pain, psoriasis, gestational diabetes, who presents with a two-day history of severe generalized pain, nausea, and fever. Patient was recently discharged on 03/13/2012 and she states that she did not have money to fill the prescription of prednisone. She was supposed to be on a prednisone taper, but she only had about 6 pills left over from previous Rx which she took. Two days ago, she began to have acute worsening of her generalized pain that was not being controlled with her home pain meds. Pain is all over, mostly in arms and legs, right knee seems to be worse than other joints. She states that her bones are painful. She also states that she has had nausea with some gagging but no vomiting, headache, and subjective fever. She did not take her temperature at home.  Review of systems positive for nonproductive cough, sore throat. No skin rashes, no dysuria, no changes in her vision.  Meds:   Medication List     As of 03/22/2012  6:02 AM    ASK your doctor about these medications         acetaminophen 500 MG tablet   Commonly known as: TYLENOL   Take 2 tablets (1,000 mg total) by mouth every 6 (six) hours as needed for pain. Do not take > 4,000mg  in 24 hours.      azaTHIOprine 50 MG  tablet   Commonly known as: IMURAN   Take by mouth daily.      famotidine 20 MG tablet   Commonly known as: PEPCID   Take 20 mg by mouth 2 (two) times daily.      hydroxychloroquine 200 MG tablet   Commonly known as: PLAQUENIL   Take 200 mg by mouth daily.      hydrOXYzine 25 MG tablet   Commonly known as: ATARAX/VISTARIL   Take 25 mg by mouth 3 (three) times daily as needed.      oxyCODONE 5 MG immediate release tablet   Commonly known as: Oxy IR/ROXICODONE   Take 5 mg by mouth every 6 (six) hours as needed.      predniSONE 20 MG tablet   Commonly known as: DELTASONE   Take by mouth at breakfast. Take 3 pills (60mg ) for 3 days, then 2 pills (40mg ) for 3 days, then 1 pill (20mg ) for 3 days, then half pill (10mg ) daily until you see [facility] rheumatology.      sulfamethoxazole-trimethoprim 800-160 MG per tablet   Commonly known as: BACTRIM DS,SEPTRA DS   Take 1 tablet by mouth every Monday, Wednesday, and Friday.        Allergies: Allergies as of 03/22/2012  . (No Known Allergies)   Past Medical History  Diagnosis Date  . Lupus (systemic lupus erythematosus)   . Psoriasis   . Gestational diabetes   . Hypertension    History reviewed. No pertinent  past surgical history. Family History  Problem Relation Age of Onset  . Diabetes Mother   . Hypertension Mother    History   Social History  . Marital Status: Single    Spouse Name: N/A    Number of Children: N/A  . Years of Education: N/A   Occupational History  . Not on file.   Social History Main Topics  . Smoking status: Never Smoker   . Smokeless tobacco: Not on file  . Alcohol Use: No  . Drug Use: No  . Sexually Active:    Other Topics Concern  . Not on file   Social History Narrative   Patient lives with husband and 2 children (age 81 and 43) in [place]. She migrated form [place] 10 years ago. She does not work. Has completed the first grade. Cannot read and write.     Review of Systems: Pertinent  items noted in HPI   Physical Exam Blood pressure 128/78, pulse 111, temperature 99.7 F (37.6 C), temperature source Oral, resp. rate 20, last menstrual period 02/29/2012, SpO2 95.00%. General:  Obese, appears uncomfortable, no acute distress, alert and oriented HEENT:  PERRL, EOMI, no lymphadenopathy, moist mucous membranes, no pharyngeal erythema or exudate, uvula not visualized Cardiovascular:  Tachycardia, normal S1, S2, no murmurs Respiratory:  Clear to auscultation bilaterally, no wheezes, rales, or rhonchi Abdomen:  Soft, nondistended, nontender, normoactive bowel sounds Extremities:  Warm and well-perfused, + pedal pulses Skin: Warm, diaphoretic, no rashes Neuro: Not anxious appearing, no depressed mood, normal affect MSK: Swelling of b/l hands and feet. Appendicular joints are all tender to palpation with limited ROM 2/2 pain. No overlying cellulitis noted. Cervical spine tender to palpation. Strength: unable to assess 2/2 pain  Lab results: Basic Metabolic Panel:  Basename 03/22/12 0104  NA 135  K 3.3*  CL 98  CO2 26  GLUCOSE 112*  BUN 6  CREATININE 0.57  CALCIUM 9.0  MG --  PHOS --   Liver Function Tests:  Basename 03/22/12 0104  AST 36  ALT 25  ALKPHOS 57  BILITOT 0.4  PROT 6.9  ALBUMIN 3.5    Basename 03/22/12 0104  LIPASE 27  AMYLASE --   CBC:  Basename 03/22/12 0104  WBC 10.0  NEUTROABS 6.8  HGB 12.9  HCT 37.9  MCV 85.4  PLT 281   Urine Drug Screen: Drugs of Abuse     Component Value Date/Time   LABOPIA POSITIVE* 03/07/2012 2354   COCAINSCRNUR NONE DETECTED 03/07/2012 2354   LABBENZ NONE DETECTED 03/07/2012 2354   AMPHETMU NONE DETECTED 03/07/2012 2354   THCU NONE DETECTED 03/07/2012 2354   LABBARB NONE DETECTED 03/07/2012 2354      Imaging results:  Dg Chest 2 View  03/22/2012  *RADIOLOGY REPORT*  Clinical Data: Generalized body pain.  Nausea.  No vomiting.  CHEST - 2 VIEW  Comparison: 03/07/2012.  Findings: Low volume chest.   Perihilar density is present bilaterally, slightly more prominent than on prior exams.     This is probably secondary to the low lung volumes.  Lung volumes are markedly low on the lateral view.  No focal consolidation. Prominent pulmonary vascularity likely secondary to volumes.  IMPRESSION: Low volume chest.  Consider repeat PA and lateral with full inspiration when the patient will tolerate.  No gross acute cardiopulmonary disease.   Original Report Authenticated By: Andreas Newport, M.D.     Assessment & Plan by Problem: Principal Problem:  *Acute rheumatoid arthritis Active Problems:  Rheumatoid arthritis  Synovitis  Chronic pain disorder  Donna Hall is a 26 year old Hispanic female with history of unclear rheumatologic disorder (RA vs SLE), chronic pain, psoriasis, gestational diabetes, who presents with a two-day history of severe generalized pain, nausea, and fever.     1) Diffuse joint pain, fever: Most likely from acute rheumatoid arthritis vs acute lupus flare. Patient recently discharged from hospital after extensive workup for to the same issue.    No new leukocytosis. Subjective fevers.   Patient stopped taking prednisone abruptly 2/2 financial strain.   Recent labs showed an elevated ESR, CRP, anti-CCP and rheumatoid factor and hand x-rays showing osteopenia.   Patient been followed at [facility] with a rheumatologist.      Vitals stable except mildly tachycardic.   Some hypoxia after Dilaudid dose in ED.   Chest x-ray negative for acute changes- although still has low volumes and so recommended to get two-view repeat with good inspiration.       - Admit to telemetry bed.   - Pain management with Dilaudid 1 mg every 4 hours when necessary.   - Prednisone 60 mg daily.   - Continue azathioprine and hydroxychloroquine.   - Blood cultures x2 collected in ED.   - Repeating rheumatic workup- deemed unnecessary due to recent workup.   - Zofran PRN for nausea vomiting.   -  Tylenol for fever and mild pain.   - Followup UA.      2) Cough: Patient still has dry cough without sputum production.   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    CT unrevealing of PE or infectious etiology.   TB workup unrevealing. Negative skin test and indeterminate quantiferon test.   Chest x-ray is essentially unchanged.   - Continue oxygen support as needed.   - Repeat 2 view chest x-ray with good inspiration if possible.      3) Tachycardia: Likely due to pain reaction.   No leukocytosis.   Blood cultures pending.   No chest pain.      4) DVT prophylaxis: Lovenox     Signed: Denton Ar 03/22/2012, 6:02 AM

## 2012-03-23 DIAGNOSIS — E668 Other obesity: Secondary | ICD-10-CM | POA: Insufficient documentation

## 2012-03-23 DIAGNOSIS — M069 Rheumatoid arthritis, unspecified: Principal | ICD-10-CM

## 2012-03-23 DIAGNOSIS — L408 Other psoriasis: Secondary | ICD-10-CM

## 2012-03-23 MED ORDER — PREDNISONE 20 MG PO TABS: 40.0000 mg | ORAL_TABLET | Freq: Every day | ORAL | Status: DC

## 2012-03-23 MED ORDER — SENNOSIDES-DOCUSATE SODIUM 8.6-50 MG PO TABS
1.0000 | ORAL_TABLET | Freq: Two times a day (BID) | ORAL | Status: DC
  Administered 2012-03-23: 1 via ORAL
  Filled 2012-03-23 (×2): qty 1

## 2012-03-23 NOTE — Discharge Summary (Signed)
Patient Name:  Donna Hall MRN: [id number]  PCP: Pcp Not In System DOB:  [birth year]       Date of Admission:  03/22/2012  Date of Discharge:  03/23/2012      Attending Physician: Inez Catalina, MD         DISCHARGE DIAGNOSES: 1.   Rheumatoid arthrotic, acute flare:  Was not taking prednisone.  Back under control now and pt has prednisone.  2.   Psoriasis:  Not active.  On hydroxychloroquine and azathioprine.  3.   Class III morbid obesity:  BMI is 47.4.   DISPOSITION AND FOLLOW-UP: Donna Hall is to follow-up with the listed providers as detailed below, at which time, the following should be addressed:   1. Follow-up visits: 1. INTERNAL MEDICINE  1. Patient should have sufficient hydroxychloroquine, prednisone, and azathioprine to make it to her [facility] rheumatology appointment.  We gave her 30 prednisone tablets and she will take 2 a day starting October 29.  2. Address patient's transportation to the [facility] appointment.  She told us she has arranged for a friend to take her.   Follow-up Information    Follow up with [name], MD. On 04/05/2012. ([facility] - [place] a las 01:30 del viernes 11 de [place].)    Contact information:   [address]  [address] [place] [address] [phone]        Discharge Orders    Future Orders Please Complete By Expires   Diet general      Increase activity slowly      Call MD for:  severe uncontrolled pain          DISCHARGE MEDICATIONS:   Medication List     As of 03/23/2012  3:56 PM    TAKE these medications         acetaminophen 500 MG tablet   Commonly known as: TYLENOL   Take 2 tablets (1,000 mg total) by mouth every 6 (six) hours as needed for pain. Do not take > 4,000mg  in 24 hours.      azaTHIOprine 50 MG tablet   Commonly known as: IMURAN   Take by mouth daily.      famotidine 20 MG tablet   Commonly known as: PEPCID   Take 20 mg by mouth 2 (two) times  daily.      hydroxychloroquine 200 MG tablet   Commonly known as: PLAQUENIL   Take 200 mg by mouth daily.      hydrOXYzine 25 MG tablet   Commonly known as: ATARAX/VISTARIL   Take 25 mg by mouth 3 (three) times daily as needed.      oxyCODONE 5 MG immediate release tablet   Commonly known as: Oxy IR/ROXICODONE   Take 5 mg by mouth every 6 (six) hours as needed.      predniSONE 20 MG tablet   Commonly known as: DELTASONE   Take 2 tablets (40 mg total) by mouth daily. Tome 2 tabletas cada dia.   Start taking on: 03/24/2012      sulfamethoxazole-trimethoprim 800-160 MG per tablet   Commonly known as: BACTRIM DS,SEPTRA DS   Take 1 tablet by mouth every Monday, Wednesday, and Friday.         CONSULTS:  NONE   PROCEDURES PERFORMED:  Dg Chest 2 View 03/22/2012   FINDINGS: Low volume chest.  Perihilar density is present bilaterally, slightly more prominent than on prior exams.  This is probably secondary to the low lung volumes.  Lung volumes are markedly low on the lateral view.  No focal consolidation. Prominent pulmonary vascularity likely secondary to volumes.   IMPRESSION: Low volume chest.  Consider repeat PA and lateral with full inspiration when the patient will tolerate.  No gross acute cardiopulmonary disease.   ADMISSION DATA: H&P: [name] is a 26 year old female with history of unclear rheumatologic disorder (RA vs SLE), chronic pain, psoriasis, gestational diabetes, who presents with a two-day history of severe generalized pain, nausea, and fever. Patient was recently discharged on 03/13/2012 and she states that she did not have money to fill the prescription of prednisone. She was supposed to be on a prednisone taper, but she only had about 6 pills left over from previous Rx which she took. Two days ago, she began to have acute worsening of her generalized pain that was not being controlled with her home pain meds. Pain is all over, mostly in arms and legs,  right knee seems to be worse than other joints. She states that her bones are painful. She also states that she has had nausea with some gagging but no vomiting, headache, and subjective fever. She did not take her temperature at home.  Review of systems positive for nonproductive cough, sore throat.  No skin rashes, no dysuria, no changes in her vision.  Physical Exam: Vitals: Blood pressure 128/78, pulse 111, temperature 99.7 F (37.6 C), temperature source Oral, resp. rate 20, last menstrual period 02/29/2012, SpO2 95.00%.  General: Obese, appears uncomfortable, no acute distress, alert and oriented  HEENT: PERRL, EOMI, no lymphadenopathy, moist mucous membranes, no pharyngeal erythema or exudate, uvula not visualized  Cardiovascular: Tachycardia, normal S1, S2, no murmurs  Respiratory: Clear to auscultation bilaterally, no wheezes, rales, or rhonchi  Abdomen: Soft, nondistended, nontender, normoactive bowel sounds  Extremities: Warm and well-perfused, + pedal pulses  Skin: Warm, diaphoretic, no rashes  Neuro: Not anxious appearing, no depressed mood, normal affect  MSK: Swelling of b/l hands and feet. Appendicular joints are all tender to palpation with limited ROM 2/2 pain. No overlying cellulitis noted. Cervical spine tender to palpation.  Strength: unable to assess 2/2 pain  Labs: Basic Metabolic Panel:  Basename  03/22/12 0104   NA  135   K  3.3*   CL  98   CO2  26   GLUCOSE  112*   BUN  6   CREATININE  0.57   CALCIUM  9.0   MG  --   PHOS  --    Liver Function Tests:  Basename  03/22/12 0104   AST  36   ALT  25   ALKPHOS  57   BILITOT  0.4   PROT  6.9   ALBUMIN  3.5    Basename  03/22/12 0104   LIPASE  27   AMYLASE  --    CBC:  Basename  03/22/12 0104   WBC  10.0   NEUTROABS  6.8   HGB  12.9   HCT  37.9   MCV  85.4   PLT  281    Urine Drug Screen:    Component  Value  Date/Time    LABOPIA  POSITIVE*  03/07/2012 2354    COCAINSCRNUR  NONE DETECTED   03/07/2012 2354    LABBENZ  NONE DETECTED  03/07/2012 2354    AMPHETMU  NONE DETECTED  03/07/2012 2354    THCU  NONE DETECTED  03/07/2012 2354  LABBARB  NONE DETECTED  03/07/2012 2354     HOSPITAL COURSE:  1.   Rheumatoid arthritis flare:  We diagnosed this patient with RA at last admission with very positive RF and CCP. She was unable to afford her prednisone and stopped it abruptly several days prior to presentation this time and presents with widespread and severe arthralgias and synovitis. We are treating her pain acutely with morphine, acetaminophen, and ketorolac while we wait for prednisone to calm this flare.  We gave her 60mg  of prednisone at 3:43 on 9/27 and then again at 12:07 on 9/27, and then at 6:01 on 9/28.  We discharged with a 30 tablets of prednisone and instructions to begin 40mg  (2 tablets) with breakfast on 9/29.  2.   Psoriasis: This is stable right now, but the patient has a history of psoriatic skin rashes when she is not taking her medications. She is on hydroxychloroquine 200mg  daily and azathioprine 100mg  daily.  We kept her on these medications here and she has enough of these two medications to make it to her [facility] appointment.   3.  Class III morbid obesity:  BMI is 47.4.    DISCHARGE DATA: Vital Signs: BP 105/62  Pulse 73  Temp 98.7 F (37.1 C) (Oral)  Resp 16  Wt 276 lb 7.3 oz (125.4 kg)  SpO2 96%  LMP 02/29/2012  Labs: No results found for this or any previous visit (from the past 24 hour(s)).   Time spent on discharge: 60 minutes   Signed by:  Dorthula Rue. Earlene Plater, MD PGY-I, Internal Medicine  03/23/2012, 5:06 PM

## 2012-03-23 NOTE — Progress Notes (Addendum)
Pt d/c home with instructions, rx, and f/u appointments. Pt verbalized understanding to myself & translator Environmental consultant), pt home with family. Pt given info to social services to check on medicaid eligibility.  Escorted self out per request.

## 2012-03-23 NOTE — Progress Notes (Signed)
Subjective:    Interval Events:  The pain is completely gone.  Even her right knee, which seems to bother her the most is pain free.  She denies dyspnea and chest pain.  She feel much better and is very thankful.  She is worried that without her other medications, her psoriasis will return; she has run out of her hydroxychloroquine and azathioprine it seems.    Objective:    Vital Signs:   Temp:  [97.2 F (36.2 C)-97.8 F (36.6 C)] 97.2 F (36.2 C) (09/28 0419) Pulse Rate:  [70-99] 70  (09/28 0419) Resp:  [18] 18  (09/28 0419) BP: (116-134)/(65-80) 116/68 mmHg (09/28 0419) SpO2:  [97 %-98 %] 97 % (09/28 0419) Last BM Date: 03/22/12   Weights: Filed Weights   03/22/12 0655  Weight: 276 lb 7.3 oz (125.4 kg)    Intake/Output:   Intake/Output Summary (Last 24 hours) at 03/23/12 7829 Last data filed at 03/22/12 1608  Gross per 24 hour  Intake      0 ml  Output   1000 ml  Net  -1000 ml      Physical Exam: General appearance: alert, cooperative, no distress and morbidly obese Resp: clear to auscultation bilaterally Cardio: regular rate and rhythm, S1, S2 normal, no murmur, click, rub or gallop MSK: Joints are pain free this morning even with full range of motion and palpation.    Labs:  NO NEW LABS    Medications:    Infusions:    . sodium chloride 1,000 mL (03/23/12 0427)     Scheduled Medications:    . acetaminophen  1,000 mg Oral Q6H  . azaTHIOprine  50 mg Oral Daily  . enoxaparin (LOVENOX) injection  40 mg Subcutaneous Q24H  . hydroxychloroquine  200 mg Oral Daily  . ketorolac  30 mg Intravenous Q6H  . pantoprazole  40 mg Oral Q1200  . predniSONE  60 mg Oral Q lunch   Followed by  . predniSONE  60 mg Oral Q breakfast  . sulfamethoxazole-trimethoprim  1 tablet Oral Q M,W,F  . DISCONTD: famotidine  20 mg Oral BID  . DISCONTD: predniSONE  60 mg Oral Daily  . DISCONTD: sulfamethoxazole-trimethoprim  1 tablet Oral Q M,W,F     PRN  Medications: hydrOXYzine, morphine injection, ondansetron (ZOFRAN) IV, ondansetron, DISCONTD: acetaminophen, DISCONTD:  HYDROmorphone (DILAUDID) injection   Assessment/ Plan:    1.   Rheumatoid arthritis flare:  We diagnosed this patient with RA at last admission with very positive RF and CCP.  She was unable to afford her prednisone and stopped it abruptly several days prior to presentation this time and presents with widespread and severe arthralgias and synovitis.  We are treating her pain acutely with morphine, acetaminophen, and ketorolac while we wait for prednisone to calm this flare. - morphine, 2-4mg  PRN Q4 - acetaminophen, 1g Q6 - ketorolac, 30mg  IV Q6 - prednisone, 60mg  - continue hydroxychloroquine 200mg  daily - continue azathioprine 50mg  daily  2.   Prophylaxis: - pantoprazole, 40mg  PO daily while receiving ketorolac and prednisone - senna-docusate, BID for bowel regimen - enoxaparin, 40mg  SQ daily for VTE prophylaxis  3.   Psoriasis:  This is stable right now, but the patient has a history of psoriatic skin rashes when she is not taking her medications.  She is on hydroxychloroquine and azathioprine. - Continue hydroxychloroquine and azathioprine here - Consider alternative approaches to control until she can follow up with [facility] or establish some form of health insurance  4.   Super obesity:  BMI is 47.4.    5.   Disposition:  Pending resolution of RA flare; need to insure pt has access to prednisone   Length of Stay: 1 days   Signed by:  Dorthula Rue. Earlene Plater, MD PGY-I, Internal Medicine Pager [phone]  03/23/2012, 7:50 AM

## 2012-03-28 LAB — CULTURE, BLOOD (ROUTINE X 2): Culture: NO GROWTH

## 2012-03-29 ENCOUNTER — Ambulatory Visit: Payer: Self-pay | Admitting: Internal Medicine

## 2012-04-03 ENCOUNTER — Ambulatory Visit: Payer: Self-pay | Admitting: Internal Medicine

## 2012-04-05 ENCOUNTER — Encounter (HOSPITAL_COMMUNITY): Payer: Self-pay | Admitting: Nurse Practitioner

## 2012-04-05 ENCOUNTER — Emergency Department (HOSPITAL_COMMUNITY)
Admission: EM | Admit: 2012-04-05 | Discharge: 2012-04-05 | Disposition: A | Discharge status: Home / Self Care | Payer: MEDICAID | Attending: Emergency Medicine | Admitting: Emergency Medicine

## 2012-04-05 ENCOUNTER — Ambulatory Visit: Payer: Self-pay | Admitting: Internal Medicine

## 2012-04-05 DIAGNOSIS — I10 Essential (primary) hypertension: Secondary | ICD-10-CM | POA: Insufficient documentation

## 2012-04-05 DIAGNOSIS — M329 Systemic lupus erythematosus, unspecified: Secondary | ICD-10-CM | POA: Insufficient documentation

## 2012-04-05 DIAGNOSIS — L509 Urticaria, unspecified: Secondary | ICD-10-CM | POA: Insufficient documentation

## 2012-04-05 MED ORDER — PREDNISONE 20 MG PO TABS: 20.0000 mg | ORAL_TABLET | Freq: Two times a day (BID) | ORAL | Status: DC

## 2012-04-05 MED ORDER — FAMOTIDINE 20 MG PO TABS: 20.0000 mg | ORAL_TABLET | Freq: Two times a day (BID) | ORAL | Status: DC

## 2012-04-05 MED ORDER — DIPHENHYDRAMINE HCL 25 MG PO CAPS: 25.0000 mg | ORAL_CAPSULE | Freq: Four times a day (QID) | ORAL | Status: DC | PRN

## 2012-04-05 MED ORDER — OXYCODONE-ACETAMINOPHEN 5-325 MG PO TABS
1.0000 | ORAL_TABLET | Freq: Once | ORAL | Status: AC
  Administered 2012-04-05: 1 via ORAL
  Filled 2012-04-05: qty 1

## 2012-04-05 MED ORDER — OXYCODONE HCL 5 MG PO CAPS: 5.0000 mg | ORAL_CAPSULE | ORAL | Status: DC | PRN

## 2012-04-05 NOTE — ED Notes (Signed)
Pt c/o painful red rash to entire body onset yesterday, denies any new products or meds, denies same rash in other household members. Pt able to speak in full unlabored sentences and breathing easily

## 2012-04-05 NOTE — ED Provider Notes (Signed)
History   This chart was scribed for No att. providers found by [name]. The patient was seen in room TR04C/TR04C. Patient's care was started at 1244.  CSN: [id number]  Arrival date & time 04/05/12  1244   First MD Initiated Contact with Patient 04/05/12 1402      Chief Complaint  Patient presents with  . Rash   The history is provided by the patient. A language interpreter was used.    Donna Hall is a 26 y.o. female who presents to the Emergency Department complaining of 6 hours of new sudden onset moderate constant rash to all extremities, abdomen, and face. Pt denotes associated hives, redness, and itching. She reports that she was fine before onset. PTA symptoms have not been treated.       She also complains of seven months of unchanged knee pain of unknown mechanism. Pain is mild and described as a constant soreness aggravated with movement and gaiting. Pt denies fever, chills, emesis, nausea, and cough.   Past Medical History  Diagnosis Date  . Lupus (systemic lupus erythematosus)   . Psoriasis   . Gestational diabetes   . Hypertension     History reviewed. No pertinent past surgical history.  Family History  Problem Relation Age of Onset  . Diabetes Mother   . Hypertension Mother     History  Substance Use Topics  . Smoking status: Never Smoker   . Smokeless tobacco: Not on file  . Alcohol Use: No   Review of Systems  Constitutional: Negative for fatigue.  HENT: Negative for congestion, sinus pressure and ear discharge.   Eyes: Negative for discharge.  Respiratory: Negative for cough.   Cardiovascular: Negative for chest pain.  Gastrointestinal: Negative for abdominal pain and diarrhea.  Genitourinary: Negative for frequency and hematuria.  Musculoskeletal: Negative for back pain.  Skin: Positive for rash.  Hematological: Negative.   All other systems reviewed and are negative.    Allergies  Review of patient's allergies indicates no  known allergies.  Home Medications   Current Outpatient Rx  Name Route Sig Dispense Refill  . ACETAMINOPHEN 500 MG PO TABS Oral Take 2 tablets (1,000 mg total) by mouth every 6 (six) hours as needed for pain. Do not take > 4,000mg  in 24 hours.    . AZATHIOPRINE 50 MG PO TABS Oral Take by mouth daily.    Marland Kitchen FAMOTIDINE 20 MG PO TABS Oral Take 20 mg by mouth 2 (two) times daily.    Marland Kitchen HYDROXYCHLOROQUINE SULFATE 200 MG PO TABS Oral Take 200 mg by mouth 2 (two) times daily.     Marland Kitchen HYDROXYZINE HCL 25 MG PO TABS Oral Take 25 mg by mouth 3 (three) times daily as needed. For dizziness    . OXYCODONE HCL 5 MG PO TABS Oral Take 5 mg by mouth every 6 (six) hours as needed.    Marland Kitchen PREDNISONE 20 MG PO TABS Oral Take 2 tablets (40 mg total) by mouth daily. Tome 2 tabletas cada dia. 30 tablet 0  . SULFAMETHOXAZOLE-TRIMETHOPRIM 800-160 MG PO TABS Oral Take 1 tablet by mouth every Monday, Wednesday, and Friday. 30 tablet 0  . DIPHENHYDRAMINE HCL 25 MG PO CAPS Oral Take 1 capsule (25 mg total) by mouth every 6 (six) hours as needed for itching. 30 capsule 0  . FAMOTIDINE 20 MG PO TABS Oral Take 1 tablet (20 mg total) by mouth 2 (two) times daily. 10 tablet 0  . OXYCODONE HCL 5 MG PO CAPS  Oral Take 1 capsule (5 mg total) by mouth every 4 (four) hours as needed. 30 capsule 0  . PREDNISONE 20 MG PO TABS Oral Take 1 tablet (20 mg total) by mouth 2 (two) times daily. 10 tablet 0    BP 135/81  Pulse 114  Temp 97.9 F (36.6 C) (Oral)  Resp 16  SpO2 97%  LMP 02/29/2012  Physical Exam  Nursing note and vitals reviewed. Constitutional: She is oriented to person, place, and time. She appears well-developed and well-nourished. No distress.  HENT:  Head: Normocephalic and atraumatic.  Eyes: Conjunctivae normal and EOM are normal.  Neck: Neck supple. No tracheal deviation present.  Cardiovascular: Normal rate.   Pulmonary/Chest: Effort normal. No respiratory distress.  Abdominal: She exhibits no distension.    Musculoskeletal: Normal range of motion.  Neurological: She is alert and oriented to person, place, and time. No sensory deficit.  Skin: Skin is dry.       Hives diffusely.  Psychiatric: She has a normal mood and affect. Her behavior is normal.    ED Course  Procedures DIAGNOSTIC STUDIES: Oxygen Saturation is 97% on room air, normal by my interpretation.    COORDINATION OF CARE: 14:21- Physician entered room. Pt is awake, alert, and oriented. Waiting on translator. 18:04- Rechecked Pt. Pt is feeling much better after treatment medications provided. 18:06- Patient informed of clinical course, understand medical decision-making process, and agree with plan. Plan: Home Medications- Prednisone, Pepcid, Benadryl ; Home Treatments- rest; Recommended follow up- Follow up and schedule and appointment with Dr. Bosie Clos at the clinic within the next week.  Labs Reviewed - No data to display No results found.   1. Hives       MDM  Hives, with unknown etiology.  Doubt metabolic instability, serious bacterial infection or impending vascular collapse; the patient is stable for discharge. The patient was out of several medications.    I personally performed the services described in this documentation, which was scribed in my presence. The recorded information has been reviewed and considered.      Flint Melter, MD 04/05/12 [phone]

## 2012-04-05 NOTE — ED Notes (Signed)
Pt has painful red, raised rash all over body. States it started yesterday.

## 2012-04-05 NOTE — ED Notes (Signed)
Called pt name x3, no answer

## 2012-04-11 ENCOUNTER — Emergency Department (INDEPENDENT_AMBULATORY_CARE_PROVIDER_SITE_OTHER)
Admission: EM | Admit: 2012-04-11 | Discharge: 2012-04-11 | Disposition: A | Discharge status: Home / Self Care | Payer: Self-pay | Source: Home / Self Care | Attending: Emergency Medicine | Admitting: Emergency Medicine

## 2012-04-11 ENCOUNTER — Encounter (HOSPITAL_COMMUNITY): Payer: Self-pay | Admitting: Emergency Medicine

## 2012-04-11 DIAGNOSIS — M069 Rheumatoid arthritis, unspecified: Secondary | ICD-10-CM

## 2012-04-11 MED ORDER — PREDNISONE 20 MG PO TABS: 40.0000 mg | ORAL_TABLET | Freq: Every day | ORAL | Status: DC

## 2012-04-11 NOTE — ED Provider Notes (Signed)
History     CSN: [id number]  Arrival date & time 04/11/12  1217   First MD Initiated Contact with Patient 04/11/12 1245      Chief Complaint  Patient presents with  . Joint Pain    (Consider location/radiation/quality/duration/timing/severity/associated sxs/prior treatment) HPI Pt presents today for pain. Patient reports that she has rheumatoid arthritis recently diagnosed in a hospitalization at [facility]. She was unable to keep her followup appointment with a rheumatologist due to transportation issues. She does not have a primary care provider in the area. She speaks only [place]. She was discharged on prednisone but is just about out of that and, as she  has been running out, the pain that she associates with her rheumatoid arthritis has been returning. She reports pain in all of her bones. There are no focal pains. She does not really report any joint swelling. She denies any nausea, vomiting, diarrhea, rashes, headaches, visual changes, shortness of breath, or chest pain.  Past Medical History  Diagnosis Date  . Lupus (systemic lupus erythematosus)   . Psoriasis   . Gestational diabetes   . Hypertension     History reviewed. No pertinent past surgical history.  Family History  Problem Relation Age of Onset  . Diabetes Mother   . Hypertension Mother     History  Substance Use Topics  . Smoking status: Never Smoker   . Smokeless tobacco: Not on file  . Alcohol Use: No    OB History    Grav Para Term Preterm Abortions TAB SAB Ect Mult Living                  Review of Systems Per HPI  Allergies  Review of patient's allergies indicates no known allergies.  Home Medications   Current Outpatient Rx  Name Route Sig Dispense Refill  . ACETAMINOPHEN 500 MG PO TABS Oral Take 2 tablets (1,000 mg total) by mouth every 6 (six) hours as needed for pain. Do not take > 4,000mg  in 24 hours.    . AZATHIOPRINE 50 MG PO TABS Oral Take by mouth daily.    Marland Kitchen DIPHENHYDRAMINE  HCL 25 MG PO CAPS Oral Take 1 capsule (25 mg total) by mouth every 6 (six) hours as needed for itching. 30 capsule 0  . FAMOTIDINE 20 MG PO TABS Oral Take 20 mg by mouth 2 (two) times daily.    Marland Kitchen FAMOTIDINE 20 MG PO TABS Oral Take 1 tablet (20 mg total) by mouth 2 (two) times daily. 10 tablet 0  . HYDROXYCHLOROQUINE SULFATE 200 MG PO TABS Oral Take 200 mg by mouth 2 (two) times daily.     Marland Kitchen HYDROXYZINE HCL 25 MG PO TABS Oral Take 25 mg by mouth 3 (three) times daily as needed. For dizziness    . OXYCODONE HCL 5 MG PO TABS Oral Take 5 mg by mouth every 6 (six) hours as needed.    . OXYCODONE HCL 5 MG PO CAPS Oral Take 1 capsule (5 mg total) by mouth every 4 (four) hours as needed. 30 capsule 0  . PREDNISONE 20 MG PO TABS Oral Take 1 tablet (20 mg total) by mouth 2 (two) times daily. 10 tablet 0  . PREDNISONE 20 MG PO TABS Oral Take 2 tablets (40 mg total) by mouth daily. Tome 2 tabletas cada dia. 60 tablet 0  . SULFAMETHOXAZOLE-TRIMETHOPRIM 800-160 MG PO TABS Oral Take 1 tablet by mouth every Monday, Wednesday, and Friday. 30 tablet 0    BP  132/68  Pulse 110  Temp 99.4 F (37.4 C) (Oral)  Resp 32  SpO2 95%  LMP 04/09/2012  Physical Exam  Constitutional: She is oriented to person, place, and time. She appears well-developed and well-nourished. No distress.  HENT:  Head: Normocephalic.  Right Ear: External ear normal.  Left Ear: External ear normal.  Eyes: Conjunctivae normal and EOM are normal. Pupils are equal, round, and reactive to light.  Neck: Normal range of motion. Neck supple. No thyromegaly present.  Cardiovascular: Normal rate, regular rhythm and normal heart sounds.   Pulmonary/Chest: Effort normal and breath sounds normal. No respiratory distress. She has no wheezes.  Abdominal: Soft. She exhibits no distension. There is no tenderness.  Musculoskeletal: Normal range of motion. She exhibits edema. She exhibits no tenderness.       Diffuse swelling of hands and feet with no  focal tenderness or joint effusions  Lymphadenopathy:    She has no cervical adenopathy.  Neurological: She is alert and oriented to person, place, and time. No cranial nerve deficit.  Skin: Skin is warm and dry. No rash noted.    ED Course  Procedures (including critical care time)  Labs Reviewed - No data to display No results found.   1. Rheumatoid arthritis       MDM  We have spoken with the staff at the internal medicine teaching clinic at [facility]. They will be contacting the patient in several days to arrange for a new patient appointment. In the meantime I have given the patient a refill on her prednisone should be good for 30 days. Within that time. She should be able to get in and see someone. She currently does not have any signs or symptoms of an infection.        Brent Bulla, MD 04/11/12 [phone]

## 2012-04-11 NOTE — ED Notes (Addendum)
Pt states that she is having joint pain and is out of pain med and at her last visit she was prescribed oxycodone but they were out of it at pharmacy and needs a new script.  Pt also needs refill on prednisone.   Pt is also c/o of headache and burning/itching of eyes.   Pt does not speak english.

## 2012-04-11 NOTE — ED Notes (Signed)
Waiting discharge papers 

## 2012-04-12 NOTE — ED Provider Notes (Signed)
Medical screening examination/treatment/procedure(s) were performed by a resident physician and as supervising physician I was immediately available for consultation/collaboration.  Additionally, I saw the patient independently, verified the history, examined the patient and discussed the treatment plan with the resident.  Leslee Home, M.D.    Reuben Likes, MD 04/12/12 [phone]

## 2012-05-13 ENCOUNTER — Other Ambulatory Visit (HOSPITAL_COMMUNITY): Payer: Self-pay | Admitting: Internal Medicine

## 2012-05-13 NOTE — Telephone Encounter (Signed)
This patient has had [facility] admissions in the past but seems never to have come to [facility].  Please find out whether she is seeking medical care anywhere before we consider refilling this prescription.

## 2012-05-14 NOTE — Telephone Encounter (Signed)
Unable to reach pt by phone.  Unable to leave message. Can you deny and ask pharmacy to have pt call clinic?

## 2012-09-30 DIAGNOSIS — M069 Rheumatoid arthritis, unspecified: Secondary | ICD-10-CM | POA: Insufficient documentation

## 2012-11-14 ENCOUNTER — Emergency Department (HOSPITAL_COMMUNITY): Payer: Self-pay

## 2012-11-14 ENCOUNTER — Encounter (HOSPITAL_COMMUNITY): Payer: Self-pay | Admitting: Emergency Medicine

## 2012-11-14 ENCOUNTER — Emergency Department (HOSPITAL_COMMUNITY)
Admission: EM | Admit: 2012-11-14 | Discharge: 2012-11-14 | Disposition: A | Discharge status: Home / Self Care | Payer: Self-pay | Attending: Emergency Medicine | Admitting: Emergency Medicine

## 2012-11-14 DIAGNOSIS — R05 Cough: Secondary | ICD-10-CM | POA: Insufficient documentation

## 2012-11-14 DIAGNOSIS — R0989 Other specified symptoms and signs involving the circulatory and respiratory systems: Secondary | ICD-10-CM | POA: Insufficient documentation

## 2012-11-14 DIAGNOSIS — Z79899 Other long term (current) drug therapy: Secondary | ICD-10-CM | POA: Insufficient documentation

## 2012-11-14 DIAGNOSIS — Z872 Personal history of diseases of the skin and subcutaneous tissue: Secondary | ICD-10-CM | POA: Insufficient documentation

## 2012-11-14 DIAGNOSIS — Z792 Long term (current) use of antibiotics: Secondary | ICD-10-CM | POA: Insufficient documentation

## 2012-11-14 DIAGNOSIS — Z8632 Personal history of gestational diabetes: Secondary | ICD-10-CM | POA: Insufficient documentation

## 2012-11-14 DIAGNOSIS — I10 Essential (primary) hypertension: Secondary | ICD-10-CM | POA: Insufficient documentation

## 2012-11-14 DIAGNOSIS — E669 Obesity, unspecified: Secondary | ICD-10-CM | POA: Insufficient documentation

## 2012-11-14 DIAGNOSIS — J209 Acute bronchitis, unspecified: Secondary | ICD-10-CM | POA: Insufficient documentation

## 2012-11-14 DIAGNOSIS — Z8739 Personal history of other diseases of the musculoskeletal system and connective tissue: Secondary | ICD-10-CM | POA: Insufficient documentation

## 2012-11-14 DIAGNOSIS — Z8701 Personal history of pneumonia (recurrent): Secondary | ICD-10-CM | POA: Insufficient documentation

## 2012-11-14 DIAGNOSIS — R059 Cough, unspecified: Secondary | ICD-10-CM | POA: Insufficient documentation

## 2012-11-14 DIAGNOSIS — IMO0002 Reserved for concepts with insufficient information to code with codable children: Secondary | ICD-10-CM | POA: Insufficient documentation

## 2012-11-14 DIAGNOSIS — M069 Rheumatoid arthritis, unspecified: Secondary | ICD-10-CM | POA: Insufficient documentation

## 2012-11-14 DIAGNOSIS — G894 Chronic pain syndrome: Secondary | ICD-10-CM | POA: Insufficient documentation

## 2012-11-14 HISTORY — DX: Chronic pain syndrome: G89.4

## 2012-11-14 HISTORY — DX: Rheumatoid arthritis, unspecified: M06.9

## 2012-11-14 HISTORY — DX: Reserved for concepts with insufficient information to code with codable children: IMO0002

## 2012-11-14 HISTORY — DX: Obesity, unspecified: E66.9

## 2012-11-14 LAB — COMPREHENSIVE METABOLIC PANEL
ALT: 11 U/L (ref 0–35)
AST: 24 U/L (ref 0–37)
Albumin: 3.3 g/dL — ABNORMAL LOW (ref 3.5–5.2)
CO2: 23 mEq/L (ref 19–32)
Calcium: 8.6 mg/dL (ref 8.4–10.5)
GFR calc non Af Amer: >90 mL/min (ref >90)
Sodium: 139 mEq/L (ref 135–145)

## 2012-11-14 LAB — CBC WITH DIFFERENTIAL/PLATELET
Basophils Absolute: 0 10*3/uL (ref 0.0–0.1)
Basophils Relative: 0 % (ref 0–1)
Eosinophils Relative: 2 % (ref 0–5)
Lymphocytes Relative: 23 % (ref 12–46)
MCV: 81.1 fL (ref 78.0–100.0)
Neutro Abs: 4.7 10*3/uL (ref 1.7–7.7)
Platelets: 321 10*3/uL (ref 150–400)
RDW: 14.7 % (ref 11.5–15.5)
WBC: 6.5 10*3/uL (ref 4.0–10.5)

## 2012-11-14 MED ORDER — PREDNISONE 10 MG PO TABS: 20.0000 mg | ORAL_TABLET | Freq: Two times a day (BID) | ORAL | Status: DC

## 2012-11-14 MED ORDER — AZITHROMYCIN 250 MG PO TABS: 250.0000 mg | ORAL_TABLET | Freq: Every day | ORAL | Status: DC

## 2012-11-14 MED ORDER — HYDROCODONE-ACETAMINOPHEN 5-325 MG PO TABS
2.0000 | ORAL_TABLET | Freq: Once | ORAL | Status: AC
  Administered 2012-11-14: 2 via ORAL
  Filled 2012-11-14: qty 2

## 2012-11-14 MED ORDER — HYDROCODONE-ACETAMINOPHEN 5-325 MG PO TABS: 2.0000 | ORAL_TABLET | ORAL | Status: DC | PRN

## 2012-11-14 NOTE — ED Notes (Signed)
PT. REPORTS MID CHEST PAIN WITH GENERALIZED BODY ACHES , CHILLS AND HEADACHE FOR SEVERAL DAYS , DENIES SOB , OCCASIONAL DRY COUGH.

## 2012-11-14 NOTE — ED Provider Notes (Signed)
History     CSN: [id number]  Arrival date & time 11/14/12  0259   First MD Initiated Contact with Patient 11/14/12 0403      Chief Complaint  Patient presents with  . Chest Pain    (Consider location/radiation/quality/duration/timing/severity/associated sxs/prior treatment) HPI Comments: Patient presents with a one week history of chest congestion, productive cough, chest pains.  She was recently diagnosed with pneumonia at [facility] and treated with antibiotics.  She got better but now the symptoms are returning.  She does not speak [place], just [place].  History was taken with the help of the translator line.  Patient is a 27 y.o. female presenting with chest pain. The history is provided by the patient.  Chest Pain Pain location:  Substernal area Pain quality: tightness   Pain radiates to:  Does not radiate Pain radiates to the back: no   Pain severity:  Moderate Onset quality:  Sudden Duration:  1 week Timing:  Constant Progression:  Worsening Chronicity:  New Context: breathing   Relieved by:  Nothing Worsened by:  Nothing tried Ineffective treatments:  None tried   Past Medical History  Diagnosis Date  . Lupus (systemic lupus erythematosus)   . Psoriasis   . Gestational diabetes   . Hypertension   . Rheumatoid arthritis   . Chronic pain disorder   . Obesity   . Chronic steroid use     History reviewed. No pertinent past surgical history.  Family History  Problem Relation Age of Onset  . Diabetes Mother   . Hypertension Mother     History  Substance Use Topics  . Smoking status: Never Smoker   . Smokeless tobacco: Not on file  . Alcohol Use: No    OB History   Grav Para Term Preterm Abortions TAB SAB Ect Mult Living                  Review of Systems  Cardiovascular: Positive for chest pain.  All other systems reviewed and are negative.    Allergies  Review of patient's allergies indicates no known allergies.  Home Medications    Current Outpatient Rx  Name  Route  Sig  Dispense  Refill  . acetaminophen (TYLENOL) 500 MG tablet   Oral   Take 2 tablets (1,000 mg total) by mouth every 6 (six) hours as needed for pain. Do not take > 4,000mg  in 24 hours.         Marland Kitchen azaTHIOprine (IMURAN) 50 MG tablet   Oral   Take by mouth daily.         . diphenhydrAMINE (BENADRYL) 25 mg capsule   Oral   Take 1 capsule (25 mg total) by mouth every 6 (six) hours as needed for itching.   30 capsule   0   . famotidine (PEPCID) 20 MG tablet   Oral   Take 20 mg by mouth 2 (two) times daily.         . famotidine (PEPCID) 20 MG tablet   Oral   Take 1 tablet (20 mg total) by mouth 2 (two) times daily.   10 tablet   0   . hydroxychloroquine (PLAQUENIL) 200 MG tablet   Oral   Take 200 mg by mouth 2 (two) times daily.          . hydrOXYzine (ATARAX/VISTARIL) 25 MG tablet   Oral   Take 25 mg by mouth 3 (three) times daily as needed. For dizziness         .  oxyCODONE (OXY IR/ROXICODONE) 5 MG immediate release tablet   Oral   Take 5 mg by mouth every 6 (six) hours as needed.         Marland Kitchen oxycodone (OXY-IR) 5 MG capsule   Oral   Take 1 capsule (5 mg total) by mouth every 4 (four) hours as needed.   30 capsule   0   . predniSONE (DELTASONE) 20 MG tablet   Oral   Take 1 tablet (20 mg total) by mouth 2 (two) times daily.   10 tablet   0   . predniSONE (DELTASONE) 20 MG tablet   Oral   Take 2 tablets (40 mg total) by mouth daily. Tome 2 tabletas cada dia.   60 tablet   0   . sulfamethoxazole-trimethoprim (BACTRIM DS,SEPTRA DS) 800-160 MG per tablet   Oral   Take 1 tablet by mouth every Monday, Wednesday, and Friday.   30 tablet   0     BP 123/75  Pulse 126  Temp(Src) 99.1 F (37.3 C) (Oral)  Resp 14  SpO2 95%  Physical Exam  Nursing note and vitals reviewed. Constitutional: She is oriented to person, place, and time. She appears well-developed and well-nourished. No distress.  HENT:  Head:  Normocephalic and atraumatic.  Neck: Normal range of motion. Neck supple.  Cardiovascular: Normal rate and regular rhythm.  Exam reveals no gallop and no friction rub.   No murmur heard. Pulmonary/Chest: Effort normal and breath sounds normal. No respiratory distress. She has no wheezes.  Abdominal: Soft. Bowel sounds are normal. She exhibits no distension. There is no tenderness.  Musculoskeletal: Normal range of motion.  Neurological: She is alert and oriented to person, place, and time.  Skin: Skin is warm and dry. She is not diaphoretic.    ED Course  Procedures (including critical care time)  Labs Reviewed  COMPREHENSIVE METABOLIC PANEL - Abnormal; Notable for the following:    Glucose, Bld 103 (*)    Creatinine, Ser 0.48 (*)    Albumin 3.3 (*)    All other components within normal limits  CBC WITH DIFFERENTIAL  POCT I-STAT TROPONIN I   Dg Chest 2 View  11/14/2012   *RADIOLOGY REPORT*  Clinical Data: Chest pain and cough.  CHEST - 2 VIEW  Comparison: 03/22/2012.  Findings: The heart is mildly enlarged but stable.  The mediastinal and hilar contours are prominent but unchanged.  There are perihilar infiltrates.  No effusion or pneumothorax.  IMPRESSION: Low lung volumes with vascular crowding and basilar atelectasis. Perihilar infiltrates.   Original Report Authenticated By: Rudie Meyer, M.D.     No diagnosis found.    MDM  Chest xrays and labs reassuring.  Will treat like bronchitis with zmax, prednisone, pain meds.          Geoffery Lyons, MD 11/14/12 [phone]

## 2012-11-14 NOTE — ED Notes (Signed)
Pt c/o generalized body aches  For one week, worse tonight so she came to ED.   States associated fever-actual temp not taken at home,  States pain 10/10 with movement of extremeties..  Chest pain midsternal with deep inspiration.  Denies nausea, vomiting and diarrhea.  Placed on cardiac monitor in ST 115. Abd obese, soft. Palpable pulses throughout

## 2012-12-25 DIAGNOSIS — D649 Anemia, unspecified: Secondary | ICD-10-CM | POA: Insufficient documentation

## 2012-12-25 DIAGNOSIS — D609 Acquired pure red cell aplasia, unspecified: Secondary | ICD-10-CM | POA: Insufficient documentation

## 2012-12-27 DIAGNOSIS — R0602 Shortness of breath: Secondary | ICD-10-CM | POA: Insufficient documentation

## 2013-01-08 DIAGNOSIS — M351 Other overlap syndromes: Secondary | ICD-10-CM | POA: Insufficient documentation

## 2013-01-08 DIAGNOSIS — M329 Systemic lupus erythematosus, unspecified: Secondary | ICD-10-CM | POA: Insufficient documentation

## 2013-01-21 DIAGNOSIS — Z79899 Other long term (current) drug therapy: Secondary | ICD-10-CM | POA: Insufficient documentation

## 2013-02-05 DIAGNOSIS — J159 Unspecified bacterial pneumonia: Secondary | ICD-10-CM | POA: Insufficient documentation

## 2013-05-25 ENCOUNTER — Emergency Department (HOSPITAL_COMMUNITY)
Admission: EM | Admit: 2013-05-25 | Discharge: 2013-05-25 | Disposition: A | Discharge status: Home / Self Care | Payer: Self-pay | Attending: Emergency Medicine | Admitting: Emergency Medicine

## 2013-05-25 ENCOUNTER — Encounter (HOSPITAL_COMMUNITY): Payer: Self-pay | Admitting: Emergency Medicine

## 2013-05-25 ENCOUNTER — Emergency Department (HOSPITAL_COMMUNITY): Payer: Self-pay

## 2013-05-25 DIAGNOSIS — I10 Essential (primary) hypertension: Secondary | ICD-10-CM | POA: Insufficient documentation

## 2013-05-25 DIAGNOSIS — R52 Pain, unspecified: Secondary | ICD-10-CM

## 2013-05-25 DIAGNOSIS — R51 Headache: Secondary | ICD-10-CM | POA: Insufficient documentation

## 2013-05-25 DIAGNOSIS — Z8632 Personal history of gestational diabetes: Secondary | ICD-10-CM | POA: Insufficient documentation

## 2013-05-25 DIAGNOSIS — Z8709 Personal history of other diseases of the respiratory system: Secondary | ICD-10-CM | POA: Insufficient documentation

## 2013-05-25 DIAGNOSIS — R0602 Shortness of breath: Secondary | ICD-10-CM | POA: Insufficient documentation

## 2013-05-25 DIAGNOSIS — Z79899 Other long term (current) drug therapy: Secondary | ICD-10-CM | POA: Insufficient documentation

## 2013-05-25 DIAGNOSIS — Z3202 Encounter for pregnancy test, result negative: Secondary | ICD-10-CM | POA: Insufficient documentation

## 2013-05-25 DIAGNOSIS — Z872 Personal history of diseases of the skin and subcutaneous tissue: Secondary | ICD-10-CM | POA: Insufficient documentation

## 2013-05-25 DIAGNOSIS — M069 Rheumatoid arthritis, unspecified: Secondary | ICD-10-CM | POA: Insufficient documentation

## 2013-05-25 DIAGNOSIS — R519 Headache, unspecified: Secondary | ICD-10-CM

## 2013-05-25 DIAGNOSIS — R091 Pleurisy: Secondary | ICD-10-CM

## 2013-05-25 DIAGNOSIS — G894 Chronic pain syndrome: Secondary | ICD-10-CM | POA: Insufficient documentation

## 2013-05-25 DIAGNOSIS — IMO0002 Reserved for concepts with insufficient information to code with codable children: Secondary | ICD-10-CM | POA: Insufficient documentation

## 2013-05-25 LAB — URINALYSIS, ROUTINE W REFLEX MICROSCOPIC
Bilirubin Urine: NEGATIVE
Leukocytes, UA: NEGATIVE
Nitrite: NEGATIVE
Specific Gravity, Urine: 1.028 (ref 1.005–1.030)
Urobilinogen, UA: 0.2 mg/dL (ref 0.0–1.0)

## 2013-05-25 LAB — BASIC METABOLIC PANEL
BUN: 17 mg/dL (ref 6–23)
Chloride: 102 mEq/L (ref 96–112)
GFR calc Af Amer: >90 mL/min (ref >90)
GFR calc non Af Amer: >90 mL/min (ref >90)
Potassium: 3.5 mEq/L (ref 3.5–5.1)
Sodium: 136 mEq/L (ref 135–145)

## 2013-05-25 LAB — CBC WITH DIFFERENTIAL/PLATELET
Basophils Absolute: 0 10*3/uL (ref 0.0–0.1)
Basophils Relative: 0 % (ref 0–1)
Eosinophils Absolute: 0.2 10*3/uL (ref 0.0–0.7)
Eosinophils Relative: 3 % (ref 0–5)
Hemoglobin: 12.1 g/dL (ref 12.0–15.0)
MCH: 31.3 pg (ref 26.0–34.0)
MCHC: 34.3 g/dL (ref 30.0–36.0)
MCV: 91.5 fL (ref 78.0–100.0)
Monocytes Relative: 4 % (ref 3–12)
Neutro Abs: 4.9 10*3/uL (ref 1.7–7.7)
Neutrophils Relative %: 68 % (ref 43–77)
Platelets: 399 10*3/uL (ref 150–400)
RDW: 13.1 % (ref 11.5–15.5)

## 2013-05-25 LAB — POCT PREGNANCY, URINE: Preg Test, Ur: NEGATIVE

## 2013-05-25 MED ORDER — HYDROMORPHONE HCL PF 1 MG/ML IJ SOLN
1.0000 mg | Freq: Once | INTRAMUSCULAR | Status: AC
  Administered 2013-05-25: 1 mg via INTRAVENOUS
  Filled 2013-05-25: qty 1

## 2013-05-25 MED ORDER — KETOROLAC TROMETHAMINE 30 MG/ML IJ SOLN
30.0000 mg | Freq: Once | INTRAMUSCULAR | Status: AC
  Administered 2013-05-25: 30 mg via INTRAVENOUS
  Filled 2013-05-25: qty 1

## 2013-05-25 MED ORDER — SODIUM CHLORIDE 0.9 % IV BOLUS (SEPSIS)
1000.0000 mL | Freq: Once | INTRAVENOUS | Status: AC
  Administered 2013-05-25: 1000 mL via INTRAVENOUS

## 2013-05-25 MED ORDER — METOCLOPRAMIDE HCL 5 MG/ML IJ SOLN
10.0000 mg | Freq: Once | INTRAMUSCULAR | Status: AC
  Administered 2013-05-25: 10 mg via INTRAVENOUS
  Filled 2013-05-25: qty 2

## 2013-05-25 MED ORDER — IOHEXOL 350 MG/ML SOLN
100.0000 mL | Freq: Once | INTRAVENOUS | Status: AC | PRN
  Administered 2013-05-25: 100 mL via INTRAVENOUS

## 2013-05-25 MED ORDER — TRAMADOL HCL 50 MG PO TABS: 50.0000 mg | ORAL_TABLET | Freq: Four times a day (QID) | ORAL | Status: DC | PRN

## 2013-05-25 NOTE — ED Notes (Signed)
Pt still finishing up with fluid boluses.

## 2013-05-25 NOTE — ED Notes (Signed)
Pt. reports generalized body aches/ headache  and joints pain with SOB , productive cough / nasal congestion for several days .

## 2013-05-25 NOTE — ED Notes (Signed)
Got pt up to use the bathroom in a wheelchair without oxygen. When pt returned to the room her sats were 84%. Pt put back on 2L and sat wet back up to 95%

## 2013-05-25 NOTE — ED Provider Notes (Signed)
CSN: [id number]     Arrival date & time 05/25/13  0140 History   First MD Initiated Contact with Patient 05/25/13 [phone]     Chief Complaint  Patient presents with  . Generalized Body Aches   (Consider location/radiation/quality/duration/timing/severity/associated sxs/prior Treatment) HPI History provided by pt.   Pt has h/o lupus and RA.  Has been experiencing diffuse bone pain for several days, but increased in severity last night.  Has had similar pain in the past.  Also c/o exertional SOB x 5 days.  Associated w/ cough and pleuritic pain on right side of chest.  Had a subjective fever 2 days ago but resolved.  Has had edema of L foot.  Denies recent travel, hospitalization and surgery, is not on OCP and no prior h/o blood clot.  Also c/o severe, L-sided headache w/ associated dizziness, blurred vision and numbness of right wrist.  Denies trauma.  Compliant w/ medications.  Past Medical History  Diagnosis Date  . Lupus (systemic lupus erythematosus)   . Psoriasis   . Gestational diabetes   . Hypertension   . Rheumatoid arthritis(714.0)   . Chronic pain disorder   . Obesity   . Chronic steroid use   . Bronchitis    History reviewed. No pertinent past surgical history. Family History  Problem Relation Age of Onset  . Diabetes Mother   . Hypertension Mother    History  Substance Use Topics  . Smoking status: Never Smoker   . Smokeless tobacco: Not on file  . Alcohol Use: No   OB History   Grav Para Term Preterm Abortions TAB SAB Ect Mult Living                 Review of Systems  All other systems reviewed and are negative.    Allergies  Review of patient's allergies indicates no known allergies.  Home Medications   Current Outpatient Rx  Name  Route  Sig  Dispense  Refill  . azaTHIOprine (IMURAN) 50 MG tablet   Oral   Take by mouth daily.         . ferrous sulfate 325 (65 FE) MG tablet   Oral   Take 325 mg by mouth daily with breakfast.         .  FLUoxetine (PROZAC) 10 MG capsule   Oral   Take 10 mg by mouth daily.         . folic acid (FOLVITE) 1 MG tablet   Oral   Take 1 mg by mouth daily.         . hydroxychloroquine (PLAQUENIL) 200 MG tablet   Oral   Take 200 mg by mouth 2 (two) times daily.          . predniSONE (DELTASONE) 5 MG tablet   Oral   Take 5 mg by mouth 2 (two) times daily with a meal.         . traMADol (ULTRAM) 50 MG tablet   Oral   Take 50 mg by mouth every 6 (six) hours as needed for moderate pain.          BP 130/56  Pulse 117  Temp(Src) 98.7 F (37.1 C) (Oral)  Resp 22  SpO2 95% Physical Exam  Nursing note and vitals reviewed. Constitutional: She is oriented to person, place, and time. She appears well-developed and well-nourished.  Morbidly obese.  Uncomfortable appearing, particularly w/ movement, even raising up her arms.  HENT:  Head: Normocephalic and atraumatic.  Eyes:  Normal appearance  Neck: Normal range of motion.  No meningismus  Cardiovascular: Regular rhythm.   Tachycardic at 119bmp  Pulmonary/Chest: Effort normal and breath sounds normal. No respiratory distress.  Tenderness R anterior chest  Abdominal: Soft. Bowel sounds are normal. She exhibits no distension and no mass. There is no tenderness. There is no rebound and no guarding.  Genitourinary:  No CVA tenderness  Musculoskeletal: Normal range of motion.  Diffuse tenderness of extremities and pain w/ passive ROM of joints.  No edema.  Neurological: She is alert and oriented to person, place, and time.  CN 3-12 intact.  No sensory deficits.  5/5 and equal upper and lower extremity strength.  No past pointing.   Skin: Skin is warm and dry. No rash noted.  Psychiatric: She has a normal mood and affect. Her behavior is normal.    ED Course  Procedures (including critical care time) Labs Review Labs Reviewed  CBC WITH DIFFERENTIAL - Abnormal; Notable for the following:    RBC 3.86 (*)    HCT 35.3 (*)    All  other components within normal limits  BASIC METABOLIC PANEL - Abnormal; Notable for the following:    Creatinine, Ser 0.49 (*)    All other components within normal limits  URINALYSIS, ROUTINE W REFLEX MICROSCOPIC - Abnormal; Notable for the following:    Color, Urine AMBER (*)    APPearance CLOUDY (*)    All other components within normal limits  POCT PREGNANCY, URINE   Imaging Review No results found.  EKG Interpretation   None       MDM   1. Pleurisy   2. Headache   3. Body aches    27yo F w/ SLE, RA, HTN and morbid obesity presents w/ multiple complaints.  Has diffuse, severe, acute on chronic myalgias/arthralgias x several days.  Has r-sided pleuritic CP, exertional SOB and cough x 5d.  Has non-traumatic L-sided headache x 2 days w associated dizziness and blurred vision.  On exam, afebrile, uncomfortable appearing, tachycardic and slightly hypoxic w/ O2 sat 94% room air, no respiratory distress, R-sided chest tenderness, no focal neuro deficits or meningeal signs, diffuse tenderness of extremities and pain w/ ROM of joints.  CT angio chest ordered to r/o PE.  EKG and labs pending as well.  Pt to receive IV morphine and toradol for pain.    Pt reports persistently severe pain in her entire body, including head.  IV reglan and second dose of dilaudid and NS bolus ordered.  5:20 AM   Pt reports that her headache, body aches and chest pain are much improved.  Current HR 110 after 2L NS bolus and O2 sat 92% rm air.  CTA somewhat non-specific but changes likely consistent w/ SLE.  Discussed case w/ Dr. Romeo Apple, who has seen her and recommends discharge home w/ refill of her ultram.  I advised f/u with her PCP in [facility] and return for worsening headache/CP/SOB or associated fever.  6:50 AM     Otilio Miu, PA-C 05/25/13 [phone]

## 2013-05-25 NOTE — ED Notes (Addendum)
Pt states that she has been feeling worse for the past 3 days. Pt states that she was visiting her sister in [place] and her generalized pain got worse so patient checked in. Pt states HA, all the way down to her feet. Pt alert and oriented ambulatory.

## 2013-05-26 NOTE — ED Provider Notes (Signed)
Medical screening examination/treatment/procedure(s) were conducted as a shared visit with non-physician practitioner(s) and myself.  I personally evaluated the patient during the encounter.  EKG Interpretation    Date/Time:  Sunday May 25 2013 05:08:58 EST Ventricular Rate:  109 PR Interval:  134 QRS Duration: 93 QT Interval:  368 QTC Calculation: 496 R Axis:   41 Text Interpretation:  Sinus tachycardia Borderline prolonged QT interval Baseline wander in lead(s) I No significant change since last tracing Confirmed by Ollin Hochmuth  MD, Deette Revak ([id number]) on 05/25/2013 5:16:52 AM            I interviewed and examined the patient. Lungs are CTAB. Cardiac exam wnl. Abdomen soft. Pt's O2 sat on RA is 92% on my exam. She remains mildly tachycardic despite IVF. I suspect that her sx are chronic and likely related to her SLE. She states that she follows w/ a physician in [place] for this and is out of pain medicine. As her labs/imaging are thus far non-contrib. I think she is appropriate for d/c w/ close f/u w her regular pcp. Return precautions given for any worsening pain, sob, fever.   Junius Argyle, MD 05/26/13 [phone]

## 2013-06-14 ENCOUNTER — Emergency Department (HOSPITAL_COMMUNITY): Payer: Self-pay

## 2013-06-14 ENCOUNTER — Encounter (HOSPITAL_COMMUNITY): Payer: Self-pay | Admitting: Emergency Medicine

## 2013-06-14 ENCOUNTER — Inpatient Hospital Stay (HOSPITAL_COMMUNITY)
Admission: EM | Admit: 2013-06-14 | Discharge: 2013-06-17 | DRG: 153 | Disposition: A | Discharge status: Home / Self Care | Payer: Self-pay | Attending: Internal Medicine | Admitting: Internal Medicine

## 2013-06-14 DIAGNOSIS — M2550 Pain in unspecified joint: Secondary | ICD-10-CM | POA: Diagnosis present

## 2013-06-14 DIAGNOSIS — IMO0002 Reserved for concepts with insufficient information to code with codable children: Secondary | ICD-10-CM

## 2013-06-14 DIAGNOSIS — Z79899 Other long term (current) drug therapy: Secondary | ICD-10-CM

## 2013-06-14 DIAGNOSIS — G894 Chronic pain syndrome: Secondary | ICD-10-CM | POA: Diagnosis present

## 2013-06-14 DIAGNOSIS — R0902 Hypoxemia: Secondary | ICD-10-CM | POA: Diagnosis present

## 2013-06-14 DIAGNOSIS — R0602 Shortness of breath: Secondary | ICD-10-CM | POA: Diagnosis present

## 2013-06-14 DIAGNOSIS — J8410 Pulmonary fibrosis, unspecified: Secondary | ICD-10-CM | POA: Diagnosis present

## 2013-06-14 DIAGNOSIS — L408 Other psoriasis: Secondary | ICD-10-CM | POA: Diagnosis present

## 2013-06-14 DIAGNOSIS — J101 Influenza due to other identified influenza virus with other respiratory manifestations: Secondary | ICD-10-CM | POA: Diagnosis present

## 2013-06-14 DIAGNOSIS — T380X5A Adverse effect of glucocorticoids and synthetic analogues, initial encounter: Secondary | ICD-10-CM | POA: Diagnosis present

## 2013-06-14 DIAGNOSIS — J111 Influenza due to unidentified influenza virus with other respiratory manifestations: Principal | ICD-10-CM | POA: Diagnosis present

## 2013-06-14 DIAGNOSIS — Z8632 Personal history of gestational diabetes: Secondary | ICD-10-CM

## 2013-06-14 DIAGNOSIS — M329 Systemic lupus erythematosus, unspecified: Secondary | ICD-10-CM | POA: Diagnosis present

## 2013-06-14 DIAGNOSIS — M069 Rheumatoid arthritis, unspecified: Secondary | ICD-10-CM | POA: Diagnosis present

## 2013-06-14 DIAGNOSIS — Z23 Encounter for immunization: Secondary | ICD-10-CM

## 2013-06-14 DIAGNOSIS — Z6841 Body Mass Index (BMI) 40.0 and over, adult: Secondary | ICD-10-CM

## 2013-06-14 DIAGNOSIS — R7309 Other abnormal glucose: Secondary | ICD-10-CM | POA: Diagnosis present

## 2013-06-14 DIAGNOSIS — Z833 Family history of diabetes mellitus: Secondary | ICD-10-CM

## 2013-06-14 DIAGNOSIS — R739 Hyperglycemia, unspecified: Secondary | ICD-10-CM | POA: Diagnosis present

## 2013-06-14 DIAGNOSIS — R000 Tachycardia, unspecified: Secondary | ICD-10-CM | POA: Diagnosis present

## 2013-06-14 DIAGNOSIS — Z8249 Family history of ischemic heart disease and other diseases of the circulatory system: Secondary | ICD-10-CM

## 2013-06-14 DIAGNOSIS — I10 Essential (primary) hypertension: Secondary | ICD-10-CM | POA: Diagnosis present

## 2013-06-14 LAB — CBC WITH DIFFERENTIAL/PLATELET
Basophils Absolute: 0 10*3/uL (ref 0.0–0.1)
Eosinophils Absolute: 0.1 10*3/uL (ref 0.0–0.7)
Eosinophils Relative: 1 % (ref 0–5)
Lymphocytes Relative: 17 % (ref 12–46)
MCV: 93.3 fL (ref 78.0–100.0)
Monocytes Relative: 3 % (ref 3–12)
Neutro Abs: 6.6 10*3/uL (ref 1.7–7.7)
Neutrophils Relative %: 78 % — ABNORMAL HIGH (ref 43–77)
Platelets: 351 10*3/uL (ref 150–400)
RDW: 13.2 % (ref 11.5–15.5)
WBC: 8.4 10*3/uL (ref 4.0–10.5)

## 2013-06-14 LAB — BASIC METABOLIC PANEL
CO2: 27 mEq/L (ref 19–32)
Calcium: 8.9 mg/dL (ref 8.4–10.5)
Chloride: 103 mEq/L (ref 96–112)
GFR calc non Af Amer: >90 mL/min (ref >90)
Potassium: 3.6 mEq/L (ref 3.5–5.1)
Sodium: 140 mEq/L (ref 135–145)

## 2013-06-14 MED ORDER — IPRATROPIUM BROMIDE 0.02 % IN SOLN
0.5000 mg | Freq: Once | RESPIRATORY_TRACT | Status: AC
  Administered 2013-06-14: 0.5 mg via RESPIRATORY_TRACT
  Filled 2013-06-14: qty 2.5

## 2013-06-14 MED ORDER — ALBUTEROL SULFATE (5 MG/ML) 0.5% IN NEBU
5.0000 mg | INHALATION_SOLUTION | Freq: Once | RESPIRATORY_TRACT | Status: AC
  Administered 2013-06-14: 5 mg via RESPIRATORY_TRACT
  Filled 2013-06-14: qty 1

## 2013-06-14 MED ORDER — MORPHINE SULFATE 4 MG/ML IJ SOLN
4.0000 mg | Freq: Once | INTRAMUSCULAR | Status: AC
  Administered 2013-06-14: 4 mg via INTRAVENOUS
  Filled 2013-06-14: qty 1

## 2013-06-14 MED ORDER — METHYLPREDNISOLONE SODIUM SUCC 125 MG IJ SOLR
125.0000 mg | Freq: Once | INTRAMUSCULAR | Status: AC
  Administered 2013-06-14: 125 mg via INTRAVENOUS
  Filled 2013-06-14: qty 2

## 2013-06-14 MED ORDER — IOHEXOL 350 MG/ML SOLN
100.0000 mL | Freq: Once | INTRAVENOUS | Status: AC | PRN
  Administered 2013-06-14: 100 mL via INTRAVENOUS

## 2013-06-14 MED ORDER — SODIUM CHLORIDE 0.9 % IV BOLUS (SEPSIS)
1000.0000 mL | Freq: Once | INTRAVENOUS | Status: AC
  Administered 2013-06-14: 1000 mL via INTRAVENOUS

## 2013-06-14 MED ORDER — ONDANSETRON HCL 4 MG/2ML IJ SOLN
4.0000 mg | Freq: Once | INTRAMUSCULAR | Status: AC
  Administered 2013-06-14: 4 mg via INTRAVENOUS
  Filled 2013-06-14: qty 2

## 2013-06-14 MED ORDER — ONDANSETRON 4 MG PO TBDP
8.0000 mg | ORAL_TABLET | Freq: Once | ORAL | Status: AC
  Administered 2013-06-14: 8 mg via ORAL
  Filled 2013-06-14: qty 2

## 2013-06-14 MED ORDER — KETOROLAC TROMETHAMINE 30 MG/ML IJ SOLN
30.0000 mg | Freq: Once | INTRAMUSCULAR | Status: AC
  Administered 2013-06-14: 30 mg via INTRAVENOUS
  Filled 2013-06-14: qty 1

## 2013-06-14 MED ORDER — OXYCODONE-ACETAMINOPHEN 5-325 MG PO TABS
2.0000 | ORAL_TABLET | Freq: Once | ORAL | Status: AC
  Administered 2013-06-14: 2 via ORAL
  Filled 2013-06-14: qty 2

## 2013-06-14 NOTE — ED Notes (Addendum)
Pt states sore throat, body aches, and fever. Ongoing x2 weeks. No nausea/vomiting. Respirations unlabored. Pt is alert and oriented x4. States history of lupus. Able to move all extremities.

## 2013-06-14 NOTE — ED Notes (Addendum)
Pt reports generalized pain, more severe to hands, feet, legs, throat and head. Denies any n/v/d or fever. Airway intact. Hx of lupus.

## 2013-06-14 NOTE — ED Provider Notes (Signed)
CSN: [id number]     Arrival date & time 06/14/13  1748 History   First MD Initiated Contact with Patient 06/14/13 1818     Chief Complaint  Patient presents with  . Pain  . Sore Throat   (Consider location/radiation/quality/duration/timing/severity/associated sxs/prior Treatment) HPI Comments: Patient is a 27 year old female with history of lupus, psoriasis, chronic pain, rheumatoid arthritis, obesity who presents today with 2 weeks of gradually worsening "bone pain". She feels as though it is a crushing pain worse and all of her balance, but specifically her hands. This pain is similar to her chronic pain, but much worse. She also has shortness of breath. The shortness of breath has been worsening for the past 3 days. It is associated with a nonproductive cough. The cough is worse at night. She has chest pain that is worse with inspiration, palpation, coughing. Additionally she has a sore throat. The pain is worse when swallowing. She has difficulty opening her mouth. She also reports complaints of nausea without vomiting. She reports she has not eaten since 9 AM. At that time she eats corn flakes. Last bowel movement was earlier today. This was normal for her. She has taken prednisone and a "pain pill" but she cannot remember the name of. These have not improved her pain. Her primary doctor is in [place]. She was on oxygen at home, but no longer uses the oxygen.   The history is provided by the patient. The history is limited by a language barrier. A language interpreter was used.    Past Medical History  Diagnosis Date  . Lupus (systemic lupus erythematosus)   . Psoriasis   . Gestational diabetes   . Hypertension   . Rheumatoid arthritis(714.0)   . Chronic pain disorder   . Obesity   . Chronic steroid use   . Bronchitis    History reviewed. No pertinent past surgical history. Family History  Problem Relation Age of Onset  . Diabetes Mother   . Hypertension Mother    History   Substance Use Topics  . Smoking status: Never Smoker   . Smokeless tobacco: Not on file  . Alcohol Use: No   OB History   Grav Para Term Preterm Abortions TAB SAB Ect Mult Living                 Review of Systems  Constitutional: Negative for fever and chills.  HENT: Positive for sore throat.   Respiratory: Positive for cough, chest tightness and shortness of breath.   Cardiovascular: Positive for chest pain. Negative for leg swelling.  Gastrointestinal: Positive for nausea. Negative for vomiting and abdominal pain.  Genitourinary: Negative for dysuria, vaginal bleeding, vaginal discharge and pelvic pain.  Musculoskeletal: Positive for arthralgias, joint swelling and myalgias.  All other systems reviewed and are negative.    Allergies  Review of patient's allergies indicates no known allergies.  Home Medications   Current Outpatient Rx  Name  Route  Sig  Dispense  Refill  . azaTHIOprine (IMURAN) 50 MG tablet   Oral   Take 50 mg by mouth daily.          . ferrous sulfate 325 (65 FE) MG tablet   Oral   Take 325 mg by mouth daily with breakfast.         . FLUoxetine (PROZAC) 10 MG capsule   Oral   Take 10 mg by mouth daily.         . folic acid (FOLVITE) 1 MG tablet  Oral   Take 1 mg by mouth daily.         . hydroxychloroquine (PLAQUENIL) 200 MG tablet   Oral   Take 200 mg by mouth 2 (two) times daily.          . predniSONE (DELTASONE) 5 MG tablet   Oral   Take 5 mg by mouth 2 (two) times daily with a meal.         . traMADol (ULTRAM) 50 MG tablet   Oral   Take 50 mg by mouth every 6 (six) hours as needed for moderate pain.          BP 122/63  Pulse 105  Temp(Src) 98.1 F (36.7 C) (Oral)  Resp 22  SpO2 93%  LMP 05/26/2013 Physical Exam  Nursing note and vitals reviewed. Constitutional: She is oriented to person, place, and time. She appears well-developed and well-nourished. She appears distressed.  Morbidly obese  HENT:  Head:  Normocephalic and atraumatic.  Right Ear: External ear normal.  Left Ear: External ear normal.  Nose: Nose normal.  Mouth/Throat: Uvula is midline and oropharynx is clear and moist. Mucous membranes are dry. No trismus in the jaw.  Eyes: Conjunctivae are normal.  Neck: Normal range of motion.  Cardiovascular: Normal rate, regular rhythm and normal heart sounds.   Pulmonary/Chest: Effort normal. No stridor. No respiratory distress. She has wheezes. She has no rales.  Abdominal: Soft. She exhibits no distension. There is tenderness in the left lower quadrant. There is no rigidity, no rebound and no guarding.  Musculoskeletal: Normal range of motion.  Neurological: She is alert and oriented to person, place, and time. She has normal strength.  Skin: Skin is warm and dry. She is not diaphoretic. No erythema.  Psychiatric: She has a normal mood and affect. Her behavior is normal.    ED Course  Procedures (including critical care time) Labs Review Labs Reviewed  CBC WITH DIFFERENTIAL - Abnormal; Notable for the following:    Neutrophils Relative % 78 (*)    All other components within normal limits  BASIC METABOLIC PANEL  INFLUENZA PANEL BY PCR   Imaging Review Dg Chest 2 View  06/14/2013   CLINICAL DATA:  Pain, sore throat  EXAM: CHEST  2 VIEW  COMPARISON:  CTA chest dated 05/25/2013. Prior chest radiographs dated 11/14/2012 and 03/22/2012.  FINDINGS: Multifocal patchy opacities, left upper lobe predominant, most of which is likely reflects chronic lung disease when correlating with prior studies. Superimposed pneumonia or less likely interstitial edema is difficult to exclude but is considered less likely. No pleural effusion or pneumothorax.  Cardiomegaly.  Visualized osseous structures are within normal limits.  IMPRESSION: Multifocal patchy opacities, left upper lobe predominant, much of which likely reflects chronic lung disease.  Superimposed pneumonia is difficult to exclude but is  considered less likely.   Electronically Signed   By: Charline Bills M.D.   On: 06/14/2013 21:07   Ct Angio Chest Pe W/cm &/or Wo Cm  06/14/2013   CLINICAL DATA:  Shortness of breath.  Chest pain and tachycardia  EXAM: CT ANGIOGRAPHY CHEST WITH CONTRAST  TECHNIQUE: Multidetector CT imaging of the chest was performed using the standard protocol during bolus administration of intravenous contrast. Multiplanar CT image reconstructions including MIPs were obtained to evaluate the vascular anatomy.  CONTRAST:  OMNIPAQUE IOHEXOL 350 MG/ML SOLN  COMPARISON:  Chest CT 05/25/2013  FINDINGS: THORACIC INLET/BODY WALL:  The left lobe thyroid gland is enlarged and heterogeneous - not  seen in 2009. Other than a stable (since 2009) nodule in the lower gland, there is no discrete measurable mass. No neighboring adenopathy.  MEDIASTINUM:  Cardiomegaly. No pericardial effusion. Enlarged main pulmonary artery at 3.8 cm. No definite pulmonary arterial filling defect, although patient body habitus limits sensitivity to the lobar or proximal segmental pulmonary arteries. No acute systemic arterial abnormality seen. There is chronic hilar and mediastinal lymphadenopathy.  LUNG WINDOWS:  Similar pattern of patchy interstitial and airspace opacities, in the left more than right lungs. No superimposed consolidation. No effusion or pneumothorax. Scattered bronchial obstruction, especially in left lower lung.  UPPER ABDOMEN:  No acute findings.  OSSEOUS:  No acute fracture.  No suspicious lytic or blastic lesions.  Review of the MIP images confirms the above findings.  IMPRESSION: 1. No evidence of pulmonary embolism, with sensitivity decreased by patient body habitus. 2. Interstitial lung disease which is stable since November 2014. Underlying cause could be sarcoidosis or the patient's lupus diagnosis. 3. Pulmonary artery hypertension. 4. Nodular thyroid.   Electronically Signed   By: Tiburcio Pea M.D.   On: 06/14/2013 23:25     EKG Interpretation   None       MDM  No diagnosis found.  Patient presents with shortness of breath and generalized pain for the past 3 days. In the emergency department I was unable to control the patient's pain with Toradol, morphine, Dilaudid. A CT in June of her chest was done which ruled out PE. The CT showed interstitial lung disease which is stable. When the patient is ambulated with a pulse ox her saturations dropped into the low 80s. She will be admitted for her hypoxia and pain control. I discussed this case with Dr. Lovell Sheehan who agrees to admit to telemetry. Admission is appreciated. Vital signs are stable at this time. Dr. Deretha Emory evaluated this patient and agrees with plan.     Mora Bellman, PA-C 06/15/13 [phone]

## 2013-06-14 NOTE — ED Notes (Signed)
The pt checked in and went straight to bathroom

## 2013-06-15 ENCOUNTER — Encounter (HOSPITAL_COMMUNITY): Payer: Self-pay | Admitting: Emergency Medicine

## 2013-06-15 DIAGNOSIS — I10 Essential (primary) hypertension: Secondary | ICD-10-CM

## 2013-06-15 DIAGNOSIS — M329 Systemic lupus erythematosus, unspecified: Secondary | ICD-10-CM | POA: Diagnosis present

## 2013-06-15 DIAGNOSIS — R7309 Other abnormal glucose: Secondary | ICD-10-CM

## 2013-06-15 DIAGNOSIS — IMO0002 Reserved for concepts with insufficient information to code with codable children: Secondary | ICD-10-CM | POA: Insufficient documentation

## 2013-06-15 DIAGNOSIS — R0902 Hypoxemia: Secondary | ICD-10-CM

## 2013-06-15 DIAGNOSIS — R739 Hyperglycemia, unspecified: Secondary | ICD-10-CM | POA: Diagnosis present

## 2013-06-15 LAB — INFLUENZA PANEL BY PCR (TYPE A & B)
Influenza A By PCR: POSITIVE — AB
Influenza B By PCR: NEGATIVE

## 2013-06-15 LAB — GLUCOSE, CAPILLARY
Glucose-Capillary: 141 mg/dL — ABNORMAL HIGH (ref 70–99)
Glucose-Capillary: 134 mg/dL — ABNORMAL HIGH (ref 70–99)
Glucose-Capillary: 120 mg/dL — ABNORMAL HIGH (ref 70–99)
Glucose-Capillary: 133 mg/dL — ABNORMAL HIGH (ref 70–99)

## 2013-06-15 LAB — CBC
HCT: 38.9 % (ref 36.0–46.0)
Hemoglobin: 12.5 g/dL (ref 12.0–15.0)
MCH: 30.2 pg (ref 26.0–34.0)
Platelets: 334 10*3/uL (ref 150–400)
RBC: 4.14 MIL/uL (ref 3.87–5.11)

## 2013-06-15 LAB — BASIC METABOLIC PANEL
Calcium: 8.7 mg/dL (ref 8.4–10.5)
Creatinine, Ser: 0.46 mg/dL — ABNORMAL LOW (ref 0.50–1.10)
GFR calc Af Amer: >90 mL/min (ref >90)
GFR calc non Af Amer: >90 mL/min (ref >90)
Glucose, Bld: 143 mg/dL — ABNORMAL HIGH (ref 70–99)
Potassium: 4.4 mEq/L (ref 3.5–5.1)
Sodium: 141 mEq/L (ref 135–145)

## 2013-06-15 LAB — HEMOGLOBIN A1C
Hgb A1c MFr Bld: 5.5 % (ref <5.7)
Mean Plasma Glucose: 111 mg/dL (ref <117)

## 2013-06-15 MED ORDER — ZOLPIDEM TARTRATE 5 MG PO TABS
5.0000 mg | ORAL_TABLET | Freq: Every evening | ORAL | Status: DC | PRN
  Administered 2013-06-16: 5 mg via ORAL
  Filled 2013-06-15 (×2): qty 1

## 2013-06-15 MED ORDER — AZATHIOPRINE 50 MG PO TABS
50.0000 mg | ORAL_TABLET | Freq: Every day | ORAL | Status: DC
  Administered 2013-06-15 – 2013-06-17 (×3): 50 mg via ORAL
  Filled 2013-06-15 (×3): qty 1

## 2013-06-15 MED ORDER — ONDANSETRON HCL 4 MG PO TABS: 4.0000 mg | ORAL_TABLET | Freq: Four times a day (QID) | ORAL | Status: DC | PRN

## 2013-06-15 MED ORDER — MORPHINE SULFATE 4 MG/ML IJ SOLN
4.0000 mg | INTRAMUSCULAR | Status: DC | PRN
  Administered 2013-06-15 – 2013-06-17 (×8): 4 mg via INTRAVENOUS
  Filled 2013-06-15 (×8): qty 1

## 2013-06-15 MED ORDER — SODIUM CHLORIDE 0.9 % IJ SOLN: 3.0000 mL | INTRAMUSCULAR | Status: DC | PRN

## 2013-06-15 MED ORDER — ENOXAPARIN SODIUM 40 MG/0.4ML ~~LOC~~ SOLN
40.0000 mg | SUBCUTANEOUS | Status: DC
  Administered 2013-06-15 – 2013-06-16 (×2): 40 mg via SUBCUTANEOUS
  Filled 2013-06-15 (×3): qty 0.4

## 2013-06-15 MED ORDER — MORPHINE SULFATE 10 MG/5ML PO SOLN
5.0000 mg | ORAL | Status: DC | PRN
  Administered 2013-06-15: 5 mg via ORAL
  Filled 2013-06-15: qty 5

## 2013-06-15 MED ORDER — FLUOXETINE HCL 10 MG PO CAPS
10.0000 mg | ORAL_CAPSULE | Freq: Every day | ORAL | Status: DC
  Administered 2013-06-15 – 2013-06-17 (×3): 10 mg via ORAL
  Filled 2013-06-15 (×3): qty 1

## 2013-06-15 MED ORDER — GUAIFENESIN-DM 100-10 MG/5ML PO SYRP
5.0000 mL | ORAL_SOLUTION | Freq: Four times a day (QID) | ORAL | Status: DC
  Administered 2013-06-15 – 2013-06-17 (×10): 5 mL via ORAL
  Filled 2013-06-15 (×13): qty 5

## 2013-06-15 MED ORDER — DEXTROSE 5 % IV SOLN
500.0000 mg | INTRAVENOUS | Status: DC
  Administered 2013-06-15 – 2013-06-16 (×2): 500 mg via INTRAVENOUS
  Filled 2013-06-15 (×2): qty 500

## 2013-06-15 MED ORDER — INFLUENZA VAC SPLIT QUAD 0.5 ML IM SUSP
0.5000 mL | INTRAMUSCULAR | Status: AC
  Administered 2013-06-16: 0.5 mL via INTRAMUSCULAR
  Filled 2013-06-15: qty 0.5

## 2013-06-15 MED ORDER — ALBUTEROL SULFATE (5 MG/ML) 0.5% IN NEBU
2.5000 mg | INHALATION_SOLUTION | Freq: Four times a day (QID) | RESPIRATORY_TRACT | Status: DC
  Administered 2013-06-15 – 2013-06-17 (×10): 2.5 mg via RESPIRATORY_TRACT
  Filled 2013-06-15 (×10): qty 0.5

## 2013-06-15 MED ORDER — HYDROXYCHLOROQUINE SULFATE 200 MG PO TABS
200.0000 mg | ORAL_TABLET | Freq: Two times a day (BID) | ORAL | Status: DC
  Administered 2013-06-15 – 2013-06-17 (×5): 200 mg via ORAL
  Filled 2013-06-15 (×6): qty 1

## 2013-06-15 MED ORDER — HYDROCOD POLST-CHLORPHEN POLST 10-8 MG/5ML PO LQCR
5.0000 mL | Freq: Two times a day (BID) | ORAL | Status: DC | PRN
  Administered 2013-06-15 – 2013-06-17 (×2): 5 mL via ORAL
  Filled 2013-06-15 (×2): qty 5

## 2013-06-15 MED ORDER — DEXTROSE 5 % IV SOLN
1.0000 g | INTRAVENOUS | Status: DC
  Administered 2013-06-15 – 2013-06-17 (×3): 1 g via INTRAVENOUS
  Filled 2013-06-15 (×3): qty 10

## 2013-06-15 MED ORDER — SODIUM CHLORIDE 0.9 % IJ SOLN
3.0000 mL | Freq: Two times a day (BID) | INTRAMUSCULAR | Status: DC
  Administered 2013-06-15 – 2013-06-17 (×5): 3 mL via INTRAVENOUS

## 2013-06-15 MED ORDER — FERROUS SULFATE 325 (65 FE) MG PO TABS
325.0000 mg | ORAL_TABLET | Freq: Every day | ORAL | Status: DC
  Administered 2013-06-15 – 2013-06-17 (×3): 325 mg via ORAL
  Filled 2013-06-15 (×4): qty 1

## 2013-06-15 MED ORDER — OXYCODONE HCL 5 MG PO TABS
5.0000 mg | ORAL_TABLET | ORAL | Status: DC | PRN
  Administered 2013-06-15: 5 mg via ORAL
  Filled 2013-06-15: qty 1

## 2013-06-15 MED ORDER — SODIUM CHLORIDE 0.9 % IJ SOLN
3.0000 mL | Freq: Two times a day (BID) | INTRAMUSCULAR | Status: DC
  Administered 2013-06-16: 3 mL via INTRAVENOUS

## 2013-06-15 MED ORDER — METHYLPREDNISOLONE SODIUM SUCC 125 MG IJ SOLR
125.0000 mg | Freq: Four times a day (QID) | INTRAMUSCULAR | Status: AC
  Administered 2013-06-15 – 2013-06-16 (×4): 125 mg via INTRAVENOUS
  Filled 2013-06-15 (×4): qty 2

## 2013-06-15 MED ORDER — FOLIC ACID 1 MG PO TABS
1.0000 mg | ORAL_TABLET | Freq: Every day | ORAL | Status: DC
  Administered 2013-06-15 – 2013-06-17 (×3): 1 mg via ORAL
  Filled 2013-06-15 (×3): qty 1

## 2013-06-15 MED ORDER — INSULIN ASPART 100 UNIT/ML ~~LOC~~ SOLN
0.0000 [IU] | SUBCUTANEOUS | Status: DC
  Administered 2013-06-15 – 2013-06-16 (×5): 1 [IU] via SUBCUTANEOUS

## 2013-06-15 MED ORDER — ALBUTEROL SULFATE (5 MG/ML) 0.5% IN NEBU: 2.5000 mg | INHALATION_SOLUTION | RESPIRATORY_TRACT | Status: DC | PRN

## 2013-06-15 MED ORDER — ACETAMINOPHEN 650 MG RE SUPP: 650.0000 mg | Freq: Four times a day (QID) | RECTAL | Status: DC | PRN

## 2013-06-15 MED ORDER — HYDROMORPHONE HCL PF 1 MG/ML IJ SOLN
0.5000 mg | INTRAMUSCULAR | Status: DC | PRN
  Administered 2013-06-15 (×2): 1 mg via INTRAVENOUS
  Filled 2013-06-15 (×2): qty 1

## 2013-06-15 MED ORDER — OSELTAMIVIR PHOSPHATE 75 MG PO CAPS
75.0000 mg | ORAL_CAPSULE | Freq: Two times a day (BID) | ORAL | Status: DC
  Administered 2013-06-15 – 2013-06-17 (×5): 75 mg via ORAL
  Filled 2013-06-15 (×6): qty 1

## 2013-06-15 MED ORDER — SODIUM CHLORIDE 0.9 % IV SOLN: 250.0000 mL | INTRAVENOUS | Status: DC | PRN

## 2013-06-15 MED ORDER — ACETAMINOPHEN 325 MG PO TABS: 650.0000 mg | ORAL_TABLET | Freq: Four times a day (QID) | ORAL | Status: DC | PRN

## 2013-06-15 MED ORDER — HYDROMORPHONE HCL PF 1 MG/ML IJ SOLN
1.0000 mg | Freq: Once | INTRAMUSCULAR | Status: AC
  Administered 2013-06-15: 1 mg via INTRAVENOUS
  Filled 2013-06-15: qty 1

## 2013-06-15 MED ORDER — KETOROLAC TROMETHAMINE 30 MG/ML IJ SOLN
30.0000 mg | Freq: Four times a day (QID) | INTRAMUSCULAR | Status: DC
  Administered 2013-06-15 – 2013-06-17 (×9): 30 mg via INTRAVENOUS
  Filled 2013-06-15 (×12): qty 1

## 2013-06-15 MED ORDER — ALUM & MAG HYDROXIDE-SIMETH 200-200-20 MG/5ML PO SUSP: 30.0000 mL | Freq: Four times a day (QID) | ORAL | Status: DC | PRN

## 2013-06-15 MED ORDER — ONDANSETRON HCL 4 MG/2ML IJ SOLN: 4.0000 mg | Freq: Four times a day (QID) | INTRAMUSCULAR | Status: DC | PRN

## 2013-06-15 MED ORDER — PANTOPRAZOLE SODIUM 40 MG PO TBEC
40.0000 mg | DELAYED_RELEASE_TABLET | Freq: Every day | ORAL | Status: DC
  Administered 2013-06-15 – 2013-06-17 (×3): 40 mg via ORAL
  Filled 2013-06-15 (×3): qty 1

## 2013-06-15 NOTE — H&P (Signed)
[facility] History and Physical  Clarita Mcelvain Chilel WUJ:[id number] DOB: [birth year] DOA: 06/14/2013  Referring physician:  EDP PCP: Pcp Not In System  Specialists:   Chief Complaint:  SOB and Body Aches and Joint Pain  HPI: Donna Hall is a 27 y.o. female with history of Systemic  Lupus Erythematosus, Psoriasis, Chronic pain, who presents with complaints of  2 weeks of gradually worsening "bone pain". She describes the pain as crushing pain in her joints and worse in her hands.  Her home pain regimen did not relieve her pain.   She also has shortness of breath which has been worsening for the past 3 days and a nonproductive cough. The cough is worse at night. She has chest discomfort that is worse with inspiration, palpation, coughing and she has a sore throat. She also reports complaints of nausea but denies vomiting. She denies having any diarrhea.      Review of Systems: The patient denies anorexia, fever, chills, headaches, weight loss, vision loss, diplopia, dizziness, decreased hearing, rhinitis, hoarseness, syncope, dyspnea on exertion, peripheral edema, balance deficits, hemoptysis, abdominal pain, vomiting, diarrhea, constipation, hematemesis, melena, hematochezia, severe indigestion/heartburn, dysuria, hematuria, incontinence, muscle weakness, suspicious skin lesions, transient blindness, difficulty walking, depression, unusual weight change, abnormal bleeding, enlarged lymph nodes, angioedema, and breast masses.    Past Medical History  Diagnosis Date  . Lupus (systemic lupus erythematosus)   . Psoriasis   . Gestational diabetes   . Hypertension   . Rheumatoid arthritis(714.0)   . Chronic pain disorder   . Obesity   . Chronic steroid use   . Bronchitis     History reviewed. No pertinent past surgical history.  Prior to Admission medications   Medication Sig Start Date End Date Taking? Authorizing Provider  azaTHIOprine (IMURAN) 50 MG tablet Take 50 mg by  mouth daily.    Yes Historical Provider, MD  ferrous sulfate 325 (65 FE) MG tablet Take 325 mg by mouth daily with breakfast.   Yes Historical Provider, MD  FLUoxetine (PROZAC) 10 MG capsule Take 10 mg by mouth daily.   Yes Historical Provider, MD  folic acid (FOLVITE) 1 MG tablet Take 1 mg by mouth daily.   Yes Historical Provider, MD  hydroxychloroquine (PLAQUENIL) 200 MG tablet Take 200 mg by mouth 2 (two) times daily.    Yes Historical Provider, MD  predniSONE (DELTASONE) 5 MG tablet Take 5 mg by mouth 2 (two) times daily with a meal.   Yes Historical Provider, MD  traMADol (ULTRAM) 50 MG tablet Take 50 mg by mouth every 6 (six) hours as needed for moderate pain.   Yes Historical Provider, MD    No Known Allergies  Social History:  reports that she has never smoked. She has never used smokeless tobacco. She reports that she does not drink alcohol or use illicit drugs.     Family History  Problem Relation Age of Onset  . Diabetes Mother   . Hypertension Mother        Physical Exam:  GEN:  Pleasant Obese  27 y.o. Hispanic  female  examined  and in no acute distress; cooperative with exam Filed Vitals:   06/14/13 2200 06/15/13 0138 06/15/13 0200 06/15/13 0232  BP: 115/66  117/67 115/55  Pulse: 109  102 100  Temp:   98.2 F (36.8 C) 98.5 F (36.9 C)  TempSrc:   Oral Oral  Resp:   18 20  Height:    5\' 2"  (1.575 m)  Weight:  127.869 kg (281 lb 14.4 oz)  SpO2: 97% 97% 96% 95%   Blood pressure 115/55, pulse 100, temperature 98.5 F (36.9 C), temperature source Oral, resp. rate 20, height 5\' 2"  (1.575 m), weight 127.869 kg (281 lb 14.4 oz), last menstrual period 05/15/2013, SpO2 95.00%. PSYCH: SHe is alert and oriented x4; does not appear anxious does not appear depressed; affect is normal HEENT: Normocephalic and Atraumatic, Mucous membranes pink; PERRLA; EOM intact; Fundi:  Benign;  No scleral icterus, Nares: Patent, Oropharynx: Clear,  Fair Dentition, Neck:  FROM, no cervical  lymphadenopathy nor thyromegaly or carotid bruit; no JVD; Breasts:: Not examined CHEST WALL: No tenderness CHEST: Normal respiration, clear to auscultation bilaterally, No Rales Rhonchi or wheezes.    HEART: Regular rate and rhythm; no murmurs rubs or gallops BACK: No kyphosis or scoliosis; no CVA tenderness ABDOMEN: Positive Bowel Sounds, Obese, soft non-tender; no masses, no organomegaly, no pannus; no intertriginous candida. Rectal Exam: Not done EXTREMITIES: No cyanosis, clubbing or edema; no ulcerations. Genitalia: not examined PULSES: 2+ and symmetric SKIN: Normal hydration no rash or ulceration CNS: Cranial nerves 2-12 grossly intact no focal neurologic deficit    Labs on Admission:  Basic Metabolic Panel:  Recent Labs Lab 06/14/13 1911  NA 140  K 3.6  CL 103  CO2 27  GLUCOSE 87  BUN 15  CREATININE 0.66  CALCIUM 8.9   Liver Function Tests: No results found for this basename: AST, ALT, ALKPHOS, BILITOT, PROT, ALBUMIN,  in the last 168 hours No results found for this basename: LIPASE, AMYLASE,  in the last 168 hours No results found for this basename: AMMONIA,  in the last 168 hours CBC:  Recent Labs Lab 06/14/13 1911 06/15/13 0430  WBC 8.4 7.7  NEUTROABS 6.6  --   HGB 13.5 12.5  HCT 40.5 38.9  MCV 93.3 94.0  PLT 351 334   Cardiac Enzymes: No results found for this basename: CKTOTAL, CKMB, CKMBINDEX, TROPONINI,  in the last 168 hours  BNP (last 3 results) No results found for this basename: PROBNP,  in the last 8760 hours CBG:  Recent Labs Lab 06/15/13 0355  GLUCAP 141*    Radiological Exams on Admission: Dg Chest 2 View  06/14/2013   CLINICAL DATA:  Pain, sore throat  EXAM: CHEST  2 VIEW  COMPARISON:  CTA chest dated 05/25/2013. Prior chest radiographs dated 11/14/2012 and 03/22/2012.  FINDINGS: Multifocal patchy opacities, left upper lobe predominant, most of which is likely reflects chronic lung disease when correlating with prior studies.  Superimposed pneumonia or less likely interstitial edema is difficult to exclude but is considered less likely. No pleural effusion or pneumothorax.  Cardiomegaly.  Visualized osseous structures are within normal limits.  IMPRESSION: Multifocal patchy opacities, left upper lobe predominant, much of which likely reflects chronic lung disease.  Superimposed pneumonia is difficult to exclude but is considered less likely.   Electronically Signed   By: Charline Bills M.D.   On: 06/14/2013 21:07   Ct Angio Chest Pe W/cm &/or Wo Cm  06/14/2013   CLINICAL DATA:  Shortness of breath.  Chest pain and tachycardia  EXAM: CT ANGIOGRAPHY CHEST WITH CONTRAST  TECHNIQUE: Multidetector CT imaging of the chest was performed using the standard protocol during bolus administration of intravenous contrast. Multiplanar CT image reconstructions including MIPs were obtained to evaluate the vascular anatomy.  CONTRAST:  OMNIPAQUE IOHEXOL 350 MG/ML SOLN  COMPARISON:  Chest CT 05/25/2013  FINDINGS: THORACIC INLET/BODY WALL:  The left lobe thyroid  gland is enlarged and heterogeneous - not seen in 2009. Other than a stable (since 2009) nodule in the lower gland, there is no discrete measurable mass. No neighboring adenopathy.  MEDIASTINUM:  Cardiomegaly. No pericardial effusion. Enlarged main pulmonary artery at 3.8 cm. No definite pulmonary arterial filling defect, although patient body habitus limits sensitivity to the lobar or proximal segmental pulmonary arteries. No acute systemic arterial abnormality seen. There is chronic hilar and mediastinal lymphadenopathy.  LUNG WINDOWS:  Similar pattern of patchy interstitial and airspace opacities, in the left more than right lungs. No superimposed consolidation. No effusion or pneumothorax. Scattered bronchial obstruction, especially in left lower lung.  UPPER ABDOMEN:  No acute findings.  OSSEOUS:  No acute fracture.  No suspicious lytic or blastic lesions.  Review of the MIP images  confirms the above findings.  IMPRESSION: 1. No evidence of pulmonary embolism, with sensitivity decreased by patient body habitus. 2. Interstitial lung disease which is stable since November 2014. Underlying cause could be sarcoidosis or the patient's lupus diagnosis. 3. Pulmonary artery hypertension. 4. Nodular thyroid.   Electronically Signed   By: Tiburcio Pea M.D.   On: 06/14/2013 23:25     Assessment/Plan Principal Problem:   Hypoxemia Active Problems:   Systemic lupus erythematosus   INTERSTITIAL LUNG DISEASE   Hypertension   Hyperglycemia     1. Hypoxemia- Due to Exacerbation of SLE versus URI compounded over her ILD.   Blood Cultures were ordered and she was p[laced on IV rocphin and Azithromycin and Albuterol Nebs, and Or and a High Dose IV Steroid taper.     2.  SLE-  On Azathioprine rx and Plaquenil and Prednisone.  She receives her care in [facility].    3..  HTN-  Monitor BPS.  IV Hydralazine PRN.    4.  Hyperglycemia-    Check HbA1c, and Glucose levels, and  SSI coverage ordered.     5.  Obesity-  Check TSH level.     6.  DVT Prophylaxis wit Lovenox.        Code Status:   FULL CODE Family Communication:    No Family Present Disposition Plan:     Inpatient    Time spent:  75 Minutes  Ron Parker [facility] Pager [phone]  If 7PM-7AM, please contact night-coverage [email or web address] Password [email or web address] 06/15/2013, 7:02 AM

## 2013-06-15 NOTE — Progress Notes (Signed)
[facility] PROGRESS NOTE   Lakshmi Sundeen Hall RUE:[id number] DOB: 1985-10-17 DOA: 06/14/2013 PCP: Pcp Not In System  Brief narrative: Donna Hall is an 27 y.o. female with a PMH of SLE, psoriasis, chronic pain syndrome on chronic opiates who was admitted on 06/15/2013 with complaints of 2 weeks of worsening "bone pain ". Upon initial evaluation emergency department, patient was found to be hypoxic and given her chronic immunosuppressive therapy, blood cultures were obtained and she was placed on empiric Rocephin and azithromycin.  Assessment/Plan: Principal Problem:   Hypoxemia, influenza A Hypoxemia likely from SLE exacerbation secondary to influenza A. in the setting of interstitial lung disease. Chest x-ray done 06/14/2013 showed multifocal patchy opacity is, left upper lobe predominant, thought to represent chronic lung disease but superimposed pneumonia could not be excluded. As such, the patient was started on empiric Rocephin/azithromycin as she is chronically immunosuppressed with medications secondary to her SLE. Subsequent CT scan of the chest was negative for pulmonary embolism, no mention of consolidation suggestive of pneumonia. Influenza panel sent and positive for influenza A. Patient currently on droplet precautions. Add Tamiflu. Also being treated with IV Solu-Medrol, nebulized bronchodilator treatment. Add Toradol and PPI therapy for GI protection. Active Problems:   INTERSTITIAL LUNG DISEASE See above.   Systemic lupus erythematosus On Azathioprine, Plaquenil, and chronic prednisone at baseline. Prednisone on hold, now on IV Solu-Medrol 125 mg every 6 hours.   Hypertension  Not on antihypertensives. Blood pressure not elevated.   Hyperglycemia Steroid induced. Followup hemoglobin A1c.  Code Status: Full. Family Communication: No family at bedside. Disposition Plan: Home when stable.   IV access:  Peripheral IV  Medical  Consultants:  None  Other Consultants:  None  Anti-infectives:  Rocephin 06/15/2013--->  Azithromycin 06/15/2013--->  Tamiflu 06/15/2013--->  HPI/Subjective: Donna Hall of generalized body aches. She has been afebrile. No nausea or vomiting. She states her reading has improved somewhat.  Objective: Filed Vitals:   06/15/13 0138 06/15/13 0200 06/15/13 0232 06/15/13 0818  BP:  117/67 115/55   Pulse:  102 100 103  Temp:  98.2 F (36.8 C) 98.5 F (36.9 C)   TempSrc:  Oral Oral   Resp:  18 20 18   Height:   5\' 2"  (1.575 m)   Weight:   127.869 kg (281 lb 14.4 oz)   SpO2: 97% 96% 95% 97%    Intake/Output Summary (Last 24 hours) at 06/15/13 1032 Last data filed at 06/15/13 1018  Gross per 24 hour  Intake    881 ml  Output    200 ml  Net    681 ml    Exam: Gen:  NAD Cardiovascular:  Tachycardic, No M/R/G Respiratory:  Lungs coarse with expiratory wheezes and scattered rhonchi Gastrointestinal:  Abdomen soft, NT/ND, + BS Extremities:  No C/E/C  Data Reviewed: Basic Metabolic Panel:  Recent Labs Lab 06/14/13 1911 06/15/13 0430  NA 140 141  K 3.6 4.4  CL 103 104  CO2 27 21  GLUCOSE 87 143*  BUN 15 18  CREATININE 0.66 0.46*  CALCIUM 8.9 8.7   GFR Estimated Creatinine Clearance: 134.2 ml/min (by C-G formula based on Cr of 0.46).  CBC:  Recent Labs Lab 06/14/13 1911 06/15/13 0430  WBC 8.4 7.7  NEUTROABS 6.6  --   HGB 13.5 12.5  HCT 40.5 38.9  MCV 93.3 94.0  PLT 351 334   CBG:  Recent Labs Lab 06/15/13 0355  GLUCAP 141*   Hgb A1c No results found for this  basename: HGBA1C,  in the last 72 hours Microbiology No results found for this or any previous visit (from the past 240 hour(s)).   Procedures and Diagnostic Studies: Dg Chest 2 View  06/14/2013   CLINICAL DATA:  Pain, sore throat  EXAM: CHEST  2 VIEW  COMPARISON:  CTA chest dated 05/25/2013. Prior chest radiographs dated 11/14/2012 and 03/22/2012.  FINDINGS: Multifocal  patchy opacities, left upper lobe predominant, most of which is likely reflects chronic lung disease when correlating with prior studies. Superimposed pneumonia or less likely interstitial edema is difficult to exclude but is considered less likely. No pleural effusion or pneumothorax.  Cardiomegaly.  Visualized osseous structures are within normal limits.  IMPRESSION: Multifocal patchy opacities, left upper lobe predominant, much of which likely reflects chronic lung disease.  Superimposed pneumonia is difficult to exclude but is considered less likely.   Electronically Signed   By: Charline Bills M.D.   On: 06/14/2013 21:07   Ct Angio Chest Pe W/cm &/or Wo Cm  06/14/2013   CLINICAL DATA:  Shortness of breath.  Chest pain and tachycardia  EXAM: CT ANGIOGRAPHY CHEST WITH CONTRAST  TECHNIQUE: Multidetector CT imaging of the chest was performed using the standard protocol during bolus administration of intravenous contrast. Multiplanar CT image reconstructions including MIPs were obtained to evaluate the vascular anatomy.  CONTRAST:  OMNIPAQUE IOHEXOL 350 MG/ML SOLN  COMPARISON:  Chest CT 05/25/2013  FINDINGS: THORACIC INLET/BODY WALL:  The left lobe thyroid gland is enlarged and heterogeneous - not seen in 2009. Other than a stable (since 2009) nodule in the lower gland, there is no discrete measurable mass. No neighboring adenopathy.  MEDIASTINUM:  Cardiomegaly. No pericardial effusion. Enlarged main pulmonary artery at 3.8 cm. No definite pulmonary arterial filling defect, although patient body habitus limits sensitivity to the lobar or proximal segmental pulmonary arteries. No acute systemic arterial abnormality seen. There is chronic hilar and mediastinal lymphadenopathy.  LUNG WINDOWS:  Similar pattern of patchy interstitial and airspace opacities, in the left more than right lungs. No superimposed consolidation. No effusion or pneumothorax. Scattered bronchial obstruction, especially in left lower  lung.  UPPER ABDOMEN:  No acute findings.  OSSEOUS:  No acute fracture.  No suspicious lytic or blastic lesions.  Review of the MIP images confirms the above findings.  IMPRESSION: 1. No evidence of pulmonary embolism, with sensitivity decreased by patient body habitus. 2. Interstitial lung disease which is stable since November 2014. Underlying cause could be sarcoidosis or the patient's lupus diagnosis. 3. Pulmonary artery hypertension. 4. Nodular thyroid.   Electronically Signed   By: Tiburcio Pea M.D.   On: 06/14/2013 23:25    Scheduled Meds: . albuterol  2.5 mg Nebulization Q6H  . azaTHIOprine  50 mg Oral Daily  . azithromycin  500 mg Intravenous Q24H  . cefTRIAXone (ROCEPHIN)  IV  1 g Intravenous Q24H  . enoxaparin (LOVENOX) injection  40 mg Subcutaneous Q24H  . ferrous sulfate  325 mg Oral Q breakfast  . FLUoxetine  10 mg Oral Daily  . folic acid  1 mg Oral Daily  . hydroxychloroquine  200 mg Oral BID  . [START ON 06/16/2013] influenza vac split quadrivalent PF  0.5 mL Intramuscular Tomorrow-1000  . insulin aspart  0-9 Units Subcutaneous Q4H  . methylPREDNISolone (SOLU-MEDROL) injection  125 mg Intravenous Q6H  . sodium chloride  3 mL Intravenous Q12H  . sodium chloride  3 mL Intravenous Q12H   Continuous Infusions:   Time spent: 30 minutes.  LOS: 1 day   Raynelle Fujikawa  [facility] Pager [phone].   *Please note that the hospitalists switch teams on Wednesdays. Please call the flow manager at [phone] if you are having difficulty reaching the hospitalist taking care of this patient as she can update you and provide the most up-to-date pager number of provider caring for the patient. If 8PM-8AM, please contact night-coverage at [email or web address], password [email or web address]  06/15/2013, 10:32 AM

## 2013-06-16 DIAGNOSIS — J101 Influenza due to other identified influenza virus with other respiratory manifestations: Secondary | ICD-10-CM | POA: Diagnosis present

## 2013-06-16 DIAGNOSIS — IMO0002 Reserved for concepts with insufficient information to code with codable children: Secondary | ICD-10-CM

## 2013-06-16 DIAGNOSIS — J111 Influenza due to unidentified influenza virus with other respiratory manifestations: Principal | ICD-10-CM

## 2013-06-16 DIAGNOSIS — M329 Systemic lupus erythematosus, unspecified: Secondary | ICD-10-CM

## 2013-06-16 LAB — GLUCOSE, CAPILLARY
Glucose-Capillary: 117 mg/dL — ABNORMAL HIGH (ref 70–99)
Glucose-Capillary: 133 mg/dL — ABNORMAL HIGH (ref 70–99)

## 2013-06-16 MED ORDER — PREDNISONE 5 MG PO TABS
5.0000 mg | ORAL_TABLET | Freq: Two times a day (BID) | ORAL | Status: DC
  Administered 2013-06-16 – 2013-06-17 (×2): 5 mg via ORAL
  Filled 2013-06-16 (×5): qty 1

## 2013-06-16 MED ORDER — AZITHROMYCIN 500 MG PO TABS
500.0000 mg | ORAL_TABLET | Freq: Every day | ORAL | Status: DC
  Administered 2013-06-17: 500 mg via ORAL
  Filled 2013-06-16: qty 1

## 2013-06-16 MED ORDER — SODIUM CHLORIDE 0.9 % IN NEBU
INHALATION_SOLUTION | RESPIRATORY_TRACT | Status: AC
  Administered 2013-06-16: 3 mL
  Filled 2013-06-16: qty 3

## 2013-06-16 MED ORDER — INSULIN ASPART 100 UNIT/ML ~~LOC~~ SOLN: 0.0000 [IU] | Freq: Three times a day (TID) | SUBCUTANEOUS | Status: DC

## 2013-06-16 MED ORDER — INSULIN ASPART 100 UNIT/ML ~~LOC~~ SOLN: 0.0000 [IU] | Freq: Every day | SUBCUTANEOUS | Status: DC

## 2013-06-16 MED ORDER — INSULIN ASPART 100 UNIT/ML ~~LOC~~ SOLN
6.0000 [IU] | Freq: Three times a day (TID) | SUBCUTANEOUS | Status: DC
  Administered 2013-06-16: 6 [IU] via SUBCUTANEOUS

## 2013-06-16 NOTE — Progress Notes (Signed)
[facility] PROGRESS NOTE   Donna Hall WGN:[id number] DOB: July 23, 1985 DOA: 06/14/2013 PCP: Pcp Not In System  Brief narrative: Donna Hall is an 27 y.o. female with a PMH of SLE, psoriasis, chronic pain syndrome on chronic opiates who was admitted on 06/15/2013 with complaints of 2 weeks of worsening "bone pain ". Upon initial evaluation emergency department, patient was found to be hypoxic and given her chronic immunosuppressive therapy, blood cultures were obtained and she was placed on empiric Rocephin and azithromycin.  Assessment/Plan: Principal Problem:   Hypoxemia, influenza A Hypoxemia likely from SLE exacerbation secondary to influenza A. in the setting of interstitial lung disease. Chest x-ray done 06/14/2013 showed multifocal patchy opacity is, left upper lobe predominant, thought to represent chronic lung disease but superimposed pneumonia could not be excluded. As such, the patient was started on empiric Rocephin/azithromycin as she is chronically immunosuppressed with medications secondary to her SLE. Subsequent CT scan of the chest was negative for pulmonary embolism, no mention of consolidation suggestive of pneumonia. Influenza panel sent and positive for influenza A. Patient currently on droplet precautions. Continue Tamiflu. Also being treated with IV Solu-Medrol, nebulized bronchodilator treatment. Continue Toradol and PPI therapy for GI protection. Active Problems:   INTERSTITIAL LUNG DISEASE See above.   Systemic lupus erythematosus On Azathioprine, Plaquenil, and chronic prednisone at baseline. Prednisone initially on hold, and given IV Solu-Medrol 125 mg every 6 hours.  Solu-medrol completed (ordered Q 6 hours x 5 doses).  Resume prednisone.   Hypertension  Not on antihypertensives. Blood pressure not elevated.   Hyperglycemia Steroid induced. Hemoglobin A1c 5.5%.  Code Status: Full. Family Communication: No family at bedside. Disposition  Plan: Home when stable.   IV access:  Peripheral IV  Medical Consultants:  None  Other Consultants:  None  Anti-infectives:  Rocephin 06/15/2013--->  Azithromycin 06/15/2013--->  Tamiflu 06/15/2013--->  HPI/Subjective: Donna Hall continues to have generalized body aches and pleuritic chest pain. Remains afebrile. No nausea or vomiting. She states her breathing has improved somewhat.  Objective: Filed Vitals:   06/16/13 0609 06/16/13 0907 06/16/13 1020 06/16/13 1341  BP: 134/71  131/72 113/59  Pulse: 93  95 93  Temp: 97.5 F (36.4 C)   98.3 F (36.8 C)  TempSrc: Oral   Oral  Resp: 20     Height:      Weight: 128.867 kg (284 lb 1.6 oz)     SpO2: 95% 96%  97%    Intake/Output Summary (Last 24 hours) at 06/16/13 1424 Last data filed at 06/16/13 1242  Gross per 24 hour  Intake    720 ml  Output    225 ml  Net    495 ml    Exam: Gen:  NAD Cardiovascular:  Tachycardic, No M/R/G Respiratory:  Lungs clearer but diminished Gastrointestinal:  Abdomen soft, NT/ND, + BS Extremities:  No C/E/C  Data Reviewed: Basic Metabolic Panel:  Recent Labs Lab 06/14/13 1911 06/15/13 0430  NA 140 141  K 3.6 4.4  CL 103 104  CO2 27 21  GLUCOSE 87 143*  BUN 15 18  CREATININE 0.66 0.46*  CALCIUM 8.9 8.7   GFR Estimated Creatinine Clearance: 134.9 ml/min (by C-G formula based on Cr of 0.46).  CBC:  Recent Labs Lab 06/14/13 1911 06/15/13 0430  WBC 8.4 7.7  NEUTROABS 6.6  --   HGB 13.5 12.5  HCT 40.5 38.9  MCV 93.3 94.0  PLT 351 334   CBG:  Recent Labs Lab 06/15/13 1136 06/15/13  1617 06/15/13 2100 06/15/13 2342 06/16/13 0827  GLUCAP 120* 133* 108* 133* 117*   Hgb A1c  Recent Labs  06/15/13 0230  HGBA1C 5.5   Microbiology Recent Results (from the past 240 hour(s))  CULTURE, BLOOD (ROUTINE X 2)     Status: None   Collection Time    06/15/13  2:30 AM      Result Value Range Status   Specimen Description BLOOD LEFT ARM   Final    Special Requests BOTTLES DRAWN AEROBIC ONLY 10CC   Final   Culture  Setup Time     Final   Value: 06/15/2013 14:39     Performed at [facility]   Culture     Final   Value:        BLOOD CULTURE RECEIVED NO GROWTH TO DATE CULTURE WILL BE HELD FOR 5 DAYS BEFORE ISSUING A FINAL NEGATIVE REPORT     Performed at [facility]   Report Status PENDING   Incomplete  CULTURE, BLOOD (ROUTINE X 2)     Status: None   Collection Time    06/15/13  2:38 AM      Result Value Range Status   Specimen Description BLOOD RIGHT ARM   Final   Special Requests BOTTLES DRAWN AEROBIC AND ANAEROBIC 10CC EACH   Final   Culture  Setup Time     Final   Value: 06/15/2013 14:39     Performed at [facility]   Culture     Final   Value:        BLOOD CULTURE RECEIVED NO GROWTH TO DATE CULTURE WILL BE HELD FOR 5 DAYS BEFORE ISSUING A FINAL NEGATIVE REPORT     Performed at [facility]   Report Status PENDING   Incomplete     Procedures and Diagnostic Studies: Dg Chest 2 View  06/14/2013   CLINICAL DATA:  Pain, sore throat  EXAM: CHEST  2 VIEW  COMPARISON:  CTA chest dated 05/25/2013. Prior chest radiographs dated 11/14/2012 and 03/22/2012.  FINDINGS: Multifocal patchy opacities, left upper lobe predominant, most of which is likely reflects chronic lung disease when correlating with prior studies. Superimposed pneumonia or less likely interstitial edema is difficult to exclude but is considered less likely. No pleural effusion or pneumothorax.  Cardiomegaly.  Visualized osseous structures are within normal limits.  IMPRESSION: Multifocal patchy opacities, left upper lobe predominant, much of which likely reflects chronic lung disease.  Superimposed pneumonia is difficult to exclude but is considered less likely.   Electronically Signed   By: Charline Bills M.D.   On: 06/14/2013 21:07   Ct Angio Chest Pe W/cm &/or Wo Cm  06/14/2013   CLINICAL DATA:  Shortness of breath.  Chest pain and  tachycardia  EXAM: CT ANGIOGRAPHY CHEST WITH CONTRAST  TECHNIQUE: Multidetector CT imaging of the chest was performed using the standard protocol during bolus administration of intravenous contrast. Multiplanar CT image reconstructions including MIPs were obtained to evaluate the vascular anatomy.  CONTRAST:  OMNIPAQUE IOHEXOL 350 MG/ML SOLN  COMPARISON:  Chest CT 05/25/2013  FINDINGS: THORACIC INLET/BODY WALL:  The left lobe thyroid gland is enlarged and heterogeneous - not seen in 2009. Other than a stable (since 2009) nodule in the lower gland, there is no discrete measurable mass. No neighboring adenopathy.  MEDIASTINUM:  Cardiomegaly. No pericardial effusion. Enlarged main pulmonary artery at 3.8 cm. No definite pulmonary arterial filling defect, although patient body habitus limits sensitivity to the lobar or  proximal segmental pulmonary arteries. No acute systemic arterial abnormality seen. There is chronic hilar and mediastinal lymphadenopathy.  LUNG WINDOWS:  Similar pattern of patchy interstitial and airspace opacities, in the left more than right lungs. No superimposed consolidation. No effusion or pneumothorax. Scattered bronchial obstruction, especially in left lower lung.  UPPER ABDOMEN:  No acute findings.  OSSEOUS:  No acute fracture.  No suspicious lytic or blastic lesions.  Review of the MIP images confirms the above findings.  IMPRESSION: 1. No evidence of pulmonary embolism, with sensitivity decreased by patient body habitus. 2. Interstitial lung disease which is stable since November 2014. Underlying cause could be sarcoidosis or the patient's lupus diagnosis. 3. Pulmonary artery hypertension. 4. Nodular thyroid.   Electronically Signed   By: Tiburcio Pea M.D.   On: 06/14/2013 23:25    Scheduled Meds: . albuterol  2.5 mg Nebulization Q6H  . azaTHIOprine  50 mg Oral Daily  . [START ON 06/17/2013] azithromycin  500 mg Oral Daily  . cefTRIAXone (ROCEPHIN)  IV  1 g Intravenous Q24H   . enoxaparin (LOVENOX) injection  40 mg Subcutaneous Q24H  . ferrous sulfate  325 mg Oral Q breakfast  . FLUoxetine  10 mg Oral Daily  . folic acid  1 mg Oral Daily  . guaiFENesin-dextromethorphan  5 mL Oral QID  . hydroxychloroquine  200 mg Oral BID  . insulin aspart  0-20 Units Subcutaneous TID WC  . insulin aspart  0-5 Units Subcutaneous QHS  . insulin aspart  6 Units Subcutaneous TID WC  . ketorolac  30 mg Intravenous Q6H  . oseltamivir  75 mg Oral BID  . pantoprazole  40 mg Oral Q0600  . sodium chloride  3 mL Intravenous Q12H  . sodium chloride  3 mL Intravenous Q12H   Continuous Infusions:   Time spent: 25 minutes.   LOS: 2 days   Keldric Poyer  [facility] Pager [phone].   *Please note that the hospitalists switch teams on Wednesdays. Please call the flow manager at [phone] if you are having difficulty reaching the hospitalist taking care of this patient as she can update you and provide the most up-to-date pager number of provider caring for the patient. If 8PM-8AM, please contact night-coverage at [email or web address], password [facility]  06/16/2013, 2:24 PM

## 2013-06-16 NOTE — Progress Notes (Signed)
Report given to receiving RN. Patient is stable with no verbal complaints and no signs or symptoms of distress or discomfort.  

## 2013-06-16 NOTE — Progress Notes (Signed)
Utilization Review Completed Moishe Schellenberg J. Junetta Hearn, RN, BSN, NCM [phone]  

## 2013-06-17 LAB — GLUCOSE, CAPILLARY
Glucose-Capillary: 118 mg/dL — ABNORMAL HIGH (ref 70–99)
Glucose-Capillary: 112 mg/dL — ABNORMAL HIGH (ref 70–99)
Glucose-Capillary: 82 mg/dL (ref 70–99)

## 2013-06-17 MED ORDER — TRAMADOL HCL 50 MG PO TABS: 50.0000 mg | ORAL_TABLET | Freq: Four times a day (QID) | ORAL | Status: DC | PRN

## 2013-06-17 MED ORDER — OSELTAMIVIR PHOSPHATE 75 MG PO CAPS: 75.0000 mg | ORAL_CAPSULE | Freq: Two times a day (BID) | ORAL | Status: DC

## 2013-06-17 MED ORDER — FLUOXETINE HCL 10 MG PO CAPS: 10.0000 mg | ORAL_CAPSULE | Freq: Every day | ORAL | Status: DC

## 2013-06-17 MED ORDER — PREDNISONE 5 MG PO TABS: 5.0000 mg | ORAL_TABLET | Freq: Two times a day (BID) | ORAL | Status: DC

## 2013-06-17 MED ORDER — AZATHIOPRINE 50 MG PO TABS: 50.0000 mg | ORAL_TABLET | Freq: Every day | ORAL | Status: DC

## 2013-06-17 MED ORDER — HYDROCOD POLST-CHLORPHEN POLST 10-8 MG/5ML PO LQCR: 5.0000 mL | Freq: Two times a day (BID) | ORAL | Status: DC | PRN

## 2013-06-17 MED ORDER — HYDROXYCHLOROQUINE SULFATE 200 MG PO TABS: 200.0000 mg | ORAL_TABLET | Freq: Two times a day (BID) | ORAL | Status: DC

## 2013-06-17 NOTE — Progress Notes (Signed)
Discussed discharge instructions with pt including follow up appt with rheumatologist and medications to take at home. Pt verbalized understanding. Prescriptions given and pt acknowledged receipt. Pt's O2 sats are 93% on room air. Pt stated that she wears oxygen sometimes at home and has an oxygen tank at home. Pt discharged home with boyfriend.  Juliane Lack, RN

## 2013-06-17 NOTE — Discharge Summary (Signed)
Physician Discharge Summary  Donna Hall LKG:[id number] DOB: 1986/03/18 DOA: 06/14/2013  PCP: Pcp Not In System  Dr. Olean Ree (Rheumatologist)  Admit date: 06/14/2013 Discharge date: 06/17/2013  Recommendations for Outpatient Follow-up:  1. F/U with Rheumatologist within 1 month. 2. Followup final blood culture results.  Discharge Diagnoses:  Principal Problem:    Hypoxemia secondary to Influenza A with respiratory manifestations Active Problems:    INTERSTITIAL LUNG DISEASE    Systemic lupus erythematosus    Hypertension    Hyperglycemia  Discharge Condition: Stable.  Diet recommendation: Carbohydrate modified.  History of present illness:  Donna Hall is an 27 y.o. female with a PMH of SLE, psoriasis, chronic pain syndrome on chronic opiates who was admitted on 06/15/2013 with complaints of 2 weeks of worsening "bone pain ". Upon initial evaluation emergency department, patient was found to be hypoxic and given her chronic immunosuppressive therapy, blood cultures were obtained and she was placed on empiric Rocephin and azithromycin.  Hospital Course by problem:  Principal Problem:  Hypoxemia, influenza A  Hypoxemia likely from SLE exacerbation secondary to influenza A. in the setting of interstitial lung disease. Chest x-ray done 06/14/2013 showed multifocal patchy opacity is, left upper lobe predominant, thought to represent chronic lung disease but superimposed pneumonia could not be excluded. As such, the patient was started on empiric Rocephin/azithromycin as she is chronically immunosuppressed with medications secondary to her SLE. Subsequent CT scan of the chest was negative for pulmonary embolism, no mention of consolidation suggestive of pneumonia. Influenza panel sent and positive for influenza A. the patient has been placed on Tamiflu and received 5 doses of high-dose Solu-Medrol prior to putting her back on her usual dose of prednisone. This  point, the patient can be discharged on 3 additional days of Tamiflu. No further antibiotic therapy deemed to be necessary given her negative CT scan.  Active Problems:  INTERSTITIAL LUNG DISEASE  See above.  Systemic lupus erythematosus  On Azathioprine, Plaquenil, and chronic prednisone at baseline. Prednisone initially on hold, and given IV Solu-Medrol 125 mg every 6 hours. Solu-medrol completed (ordered Q 6 hours x 5 doses). Continue home dose of prednisone.  Hypertension  Not on antihypertensives. Blood pressure not elevated.  Hyperglycemia  Steroid induced. Hemoglobin A1c 5.5%. Sugars controlled prior to discharge with discontinuation of Solu-Medrol.  Procedures:  None  Consultations:  None  Discharge Exam: Filed Vitals:   06/17/13 0521  BP: 109/67  Pulse: 67  Temp: 98.1 F (36.7 C)  Resp: 20   Filed Vitals:   06/16/13 2143 06/17/13 0214 06/17/13 0521 06/17/13 1106  BP:   109/67   Pulse:  74 67   Temp:   98.1 F (36.7 C)   TempSrc:   Oral   Resp:  20 20   Height:      Weight:   129.1 kg (284 lb 9.8 oz)   SpO2: 99%  95% 99%    Gen:  NAD Cardiovascular:  RRR, No M/R/G Respiratory: Lungs diminished Gastrointestinal: Abdomen soft, NT/ND with normal active bowel sounds. Extremities: No C/E/C   Discharge Instructions  Discharge Orders   Future Orders Complete By Expires   Call MD for:  difficulty breathing, headache or visual disturbances  As directed    Call MD for:  extreme fatigue  As directed    Call MD for:  severe uncontrolled pain  As directed    Call MD for:  temperature >100.4  As directed    Diet Carb Modified  As directed  Increase activity slowly  As directed        Medication List         azaTHIOprine 50 MG tablet  Commonly known as:  IMURAN  Take 1 tablet (50 mg total) by mouth daily.     chlorpheniramine-HYDROcodone 10-8 MG/5ML Lqcr  Commonly known as:  TUSSIONEX  Take 5 mLs by mouth every 12 (twelve) hours as needed for cough.      ferrous sulfate 325 (65 FE) MG tablet  Take 325 mg by mouth daily with breakfast.     FLUoxetine 10 MG capsule  Commonly known as:  PROZAC  Take 1 capsule (10 mg total) by mouth daily.     folic acid 1 MG tablet  Commonly known as:  FOLVITE  Take 1 mg by mouth daily.     hydroxychloroquine 200 MG tablet  Commonly known as:  PLAQUENIL  Take 1 tablet (200 mg total) by mouth 2 (two) times daily.     oseltamivir 75 MG capsule  Commonly known as:  TAMIFLU  Take 1 capsule (75 mg total) by mouth 2 (two) times daily.     predniSONE 5 MG tablet  Commonly known as:  DELTASONE  Take 1 tablet (5 mg total) by mouth 2 (two) times daily with a meal.     traMADol 50 MG tablet  Commonly known as:  ULTRAM  Take 1 tablet (50 mg total) by mouth every 6 (six) hours as needed for moderate pain.          The results of significant diagnostics from this hospitalization (including imaging, microbiology, ancillary and laboratory) are listed below for reference.    Significant Diagnostic Studies: Dg Chest 2 View  06/14/2013   CLINICAL DATA:  Pain, sore throat  EXAM: CHEST  2 VIEW  COMPARISON:  CTA chest dated 05/25/2013. Prior chest radiographs dated 11/14/2012 and 03/22/2012.  FINDINGS: Multifocal patchy opacities, left upper lobe predominant, most of which is likely reflects chronic lung disease when correlating with prior studies. Superimposed pneumonia or less likely interstitial edema is difficult to exclude but is considered less likely. No pleural effusion or pneumothorax.  Cardiomegaly.  Visualized osseous structures are within normal limits.  IMPRESSION: Multifocal patchy opacities, left upper lobe predominant, much of which likely reflects chronic lung disease.  Superimposed pneumonia is difficult to exclude but is considered less likely.   Electronically Signed   By: Charline Bills M.D.   On: 06/14/2013 21:07   Ct Angio Chest Pe W/cm &/or Wo Cm  06/14/2013   CLINICAL DATA:  Shortness  of breath.  Chest pain and tachycardia  EXAM: CT ANGIOGRAPHY CHEST WITH CONTRAST  TECHNIQUE: Multidetector CT imaging of the chest was performed using the standard protocol during bolus administration of intravenous contrast. Multiplanar CT image reconstructions including MIPs were obtained to evaluate the vascular anatomy.  CONTRAST:  OMNIPAQUE IOHEXOL 350 MG/ML SOLN  COMPARISON:  Chest CT 05/25/2013  FINDINGS: THORACIC INLET/BODY WALL:  The left lobe thyroid gland is enlarged and heterogeneous - not seen in 2009. Other than a stable (since 2009) nodule in the lower gland, there is no discrete measurable mass. No neighboring adenopathy.  MEDIASTINUM:  Cardiomegaly. No pericardial effusion. Enlarged main pulmonary artery at 3.8 cm. No definite pulmonary arterial filling defect, although patient body habitus limits sensitivity to the lobar or proximal segmental pulmonary arteries. No acute systemic arterial abnormality seen. There is chronic hilar and mediastinal lymphadenopathy.  LUNG WINDOWS:  Similar pattern of patchy interstitial and airspace  opacities, in the left more than right lungs. No superimposed consolidation. No effusion or pneumothorax. Scattered bronchial obstruction, especially in left lower lung.  UPPER ABDOMEN:  No acute findings.  OSSEOUS:  No acute fracture.  No suspicious lytic or blastic lesions.  Review of the MIP images confirms the above findings.  IMPRESSION: 1. No evidence of pulmonary embolism, with sensitivity decreased by patient body habitus. 2. Interstitial lung disease which is stable since November 2014. Underlying cause could be sarcoidosis or the patient's lupus diagnosis. 3. Pulmonary artery hypertension. 4. Nodular thyroid.   Electronically Signed   By: Tiburcio Pea M.D.   On: 06/14/2013 23:25   Ct Angio Chest Pe W/cm &/or Wo Cm  05/25/2013   CLINICAL DATA:  Chest pain and headache. Decreased oxygen saturation with exertion.  EXAM: CT ANGIOGRAPHY CHEST WITH CONTRAST   TECHNIQUE: Multidetector CT imaging of the chest was performed using the standard protocol during bolus administration of intravenous contrast. Multiplanar CT image reconstructions including MIPs were obtained to evaluate the vascular anatomy.  CONTRAST:  OMNIPAQUE IOHEXOL 350 MG/ML SOLN  COMPARISON:  03/07/2012  FINDINGS: Study is technically limited due to patient's body habitus. Limited contrast bolus. The central and proximal segmental pulmonary arteries are moderately well visualized and no filling defects are demonstrated in the visualized vessels. Peripheral emboli cannot be excluded. The heart is enlarged. The central pulmonary arteries and veins are enlarged. Findings may suggest pulmonary arterial hypertension with venous congestion. Normal caliber thoracic aorta. The low lung volumes. Respiratory motion artifact limits visualization of the lungs but there is suggestion of infiltration or edema in the left lung and right lung base. Calcified granuloma in the right lung. No evidence of significant lymphadenopathy in the chest. Visualized upper abdominal organs are grossly unremarkable.  Review of the MIP images confirms the above findings.  IMPRESSION: Technically limited study due to patient's body habitus and limited contrast bolus. No large central pulmonary emboli are visualized but peripheral emboli cannot be excluded there is cardiac enlargement with enlarged pulmonary arterial and venous vascularity. Changes may represent arterial hypertension or/and venous congestion. Suggestion of infiltration or edema in the lungs.   Electronically Signed   By: Burman Nieves M.D.   On: 05/25/2013 06:10    Labs:  Basic Metabolic Panel:  Recent Labs Lab 06/14/13 1911 06/15/13 0430  NA 140 141  K 3.6 4.4  CL 103 104  CO2 27 21  GLUCOSE 87 143*  BUN 15 18  CREATININE 0.66 0.46*  CALCIUM 8.9 8.7   GFR Estimated Creatinine Clearance: 135 ml/min (by C-G formula based on Cr of  0.46).  CBC:  Recent Labs Lab 06/14/13 1911 06/15/13 0430  WBC 8.4 7.7  NEUTROABS 6.6  --   HGB 13.5 12.5  HCT 40.5 38.9  MCV 93.3 94.0  PLT 351 334   CBG:  Recent Labs Lab 06/16/13 1206 06/16/13 1554 06/16/13 2032 06/17/13 0615 06/17/13 1136  GLUCAP 110* 118* 112* 84 82   Hgb A1c  Recent Labs  06/15/13 0230  HGBA1C 5.5   Microbiology Recent Results (from the past 240 hour(s))  CULTURE, BLOOD (ROUTINE X 2)     Status: None   Collection Time    06/15/13  2:30 AM      Result Value Range Status   Specimen Description BLOOD LEFT ARM   Final   Special Requests BOTTLES DRAWN AEROBIC ONLY 10CC   Final   Culture  Setup Time     Final  Value: 06/15/2013 14:39     Performed at [facility]   Culture     Final   Value:        BLOOD CULTURE RECEIVED NO GROWTH TO DATE CULTURE WILL BE HELD FOR 5 DAYS BEFORE ISSUING A FINAL NEGATIVE REPORT     Performed at [facility]   Report Status PENDING   Incomplete  CULTURE, BLOOD (ROUTINE X 2)     Status: None   Collection Time    06/15/13  2:38 AM      Result Value Range Status   Specimen Description BLOOD RIGHT ARM   Final   Special Requests BOTTLES DRAWN AEROBIC AND ANAEROBIC 10CC EACH   Final   Culture  Setup Time     Final   Value: 06/15/2013 14:39     Performed at [facility]   Culture     Final   Value:        BLOOD CULTURE RECEIVED NO GROWTH TO DATE CULTURE WILL BE HELD FOR 5 DAYS BEFORE ISSUING A FINAL NEGATIVE REPORT     Performed at [facility]   Report Status PENDING   Incomplete    Time coordinating discharge: 35 minutes.  Signed:  Ahlijah Raia  Pager [phone] [facility] 06/17/2013, 11:46 AM

## 2013-06-17 NOTE — Progress Notes (Signed)
Interpreter Wyvonnia Dusky for DR Rama

## 2013-06-18 NOTE — ED Provider Notes (Signed)
Medical screening examination/treatment/procedure(s) were performed by non-physician practitioner and as supervising physician I was immediately available for consultation/collaboration.  EKG Interpretation    Date/Time:    Ventricular Rate:    PR Interval:    QRS Duration:   QT Interval:    QTC Calculation:   R Axis:     Text Interpretation:                Shelda Jakes, MD 06/18/13 1529

## 2013-06-21 LAB — CULTURE, BLOOD (ROUTINE X 2): Culture: NO GROWTH

## 2013-07-20 ENCOUNTER — Encounter (HOSPITAL_COMMUNITY): Payer: Self-pay | Admitting: Emergency Medicine

## 2013-07-20 ENCOUNTER — Emergency Department (HOSPITAL_COMMUNITY): Payer: Self-pay

## 2013-07-20 ENCOUNTER — Emergency Department (HOSPITAL_COMMUNITY)
Admission: EM | Admit: 2013-07-20 | Discharge: 2013-07-21 | Disposition: A | Discharge status: Home / Self Care | Payer: Self-pay | Attending: Emergency Medicine | Admitting: Emergency Medicine

## 2013-07-20 DIAGNOSIS — E669 Obesity, unspecified: Secondary | ICD-10-CM | POA: Insufficient documentation

## 2013-07-20 DIAGNOSIS — I10 Essential (primary) hypertension: Secondary | ICD-10-CM | POA: Insufficient documentation

## 2013-07-20 DIAGNOSIS — IMO0002 Reserved for concepts with insufficient information to code with codable children: Secondary | ICD-10-CM | POA: Insufficient documentation

## 2013-07-20 DIAGNOSIS — B349 Viral infection, unspecified: Secondary | ICD-10-CM

## 2013-07-20 DIAGNOSIS — Z79899 Other long term (current) drug therapy: Secondary | ICD-10-CM | POA: Insufficient documentation

## 2013-07-20 DIAGNOSIS — M329 Systemic lupus erythematosus, unspecified: Secondary | ICD-10-CM | POA: Insufficient documentation

## 2013-07-20 DIAGNOSIS — Z3202 Encounter for pregnancy test, result negative: Secondary | ICD-10-CM | POA: Insufficient documentation

## 2013-07-20 DIAGNOSIS — G894 Chronic pain syndrome: Secondary | ICD-10-CM | POA: Insufficient documentation

## 2013-07-20 DIAGNOSIS — L408 Other psoriasis: Secondary | ICD-10-CM | POA: Insufficient documentation

## 2013-07-20 DIAGNOSIS — R000 Tachycardia, unspecified: Secondary | ICD-10-CM | POA: Insufficient documentation

## 2013-07-20 DIAGNOSIS — M069 Rheumatoid arthritis, unspecified: Secondary | ICD-10-CM | POA: Insufficient documentation

## 2013-07-20 DIAGNOSIS — B9789 Other viral agents as the cause of diseases classified elsewhere: Secondary | ICD-10-CM | POA: Insufficient documentation

## 2013-07-20 DIAGNOSIS — Z8632 Personal history of gestational diabetes: Secondary | ICD-10-CM | POA: Insufficient documentation

## 2013-07-20 LAB — COMPREHENSIVE METABOLIC PANEL
ALBUMIN: 3.3 g/dL — AB (ref 3.5–5.2)
ALT: 17 U/L (ref 0–35)
AST: 22 U/L (ref 0–37)
Alkaline Phosphatase: 111 U/L (ref 39–117)
BILIRUBIN TOTAL: 0.2 mg/dL — AB (ref 0.3–1.2)
BUN: 9 mg/dL (ref 6–23)
CO2: 22 mEq/L (ref 19–32)
Calcium: 8.5 mg/dL (ref 8.4–10.5)
Chloride: 103 mEq/L (ref 96–112)
Creatinine, Ser: 0.37 mg/dL — ABNORMAL LOW (ref 0.50–1.10)
GFR calc Af Amer: >90 mL/min (ref >90)
GFR calc non Af Amer: >90 mL/min (ref >90)
Glucose, Bld: 116 mg/dL — ABNORMAL HIGH (ref 70–99)
Potassium: 4 mEq/L (ref 3.7–5.3)
Sodium: 138 mEq/L (ref 137–147)
Total Protein: 7.4 g/dL (ref 6.0–8.3)

## 2013-07-20 LAB — CBC WITH DIFFERENTIAL/PLATELET
Basophils Absolute: 0 10*3/uL (ref 0.0–0.1)
Basophils Relative: 0 % (ref 0–1)
EOS ABS: 0.1 10*3/uL (ref 0.0–0.7)
Eosinophils Relative: 1 % (ref 0–5)
HCT: 40.3 % (ref 36.0–46.0)
Hemoglobin: 13.7 g/dL (ref 12.0–15.0)
Lymphocytes Relative: 23 % (ref 12–46)
Lymphs Abs: 1.8 10*3/uL (ref 0.7–4.0)
MCH: 29.9 pg (ref 26.0–34.0)
MCHC: 34 g/dL (ref 30.0–36.0)
MCV: 88 fL (ref 78.0–100.0)
Monocytes Absolute: 0.2 10*3/uL (ref 0.1–1.0)
Monocytes Relative: 3 % (ref 3–12)
Neutro Abs: 5.7 10*3/uL (ref 1.7–7.7)
Neutrophils Relative %: 73 % (ref 43–77)
PLATELETS: 340 10*3/uL (ref 150–400)
RBC: 4.58 MIL/uL (ref 3.87–5.11)
RDW: 12.8 % (ref 11.5–15.5)
WBC: 7.8 10*3/uL (ref 4.0–10.5)

## 2013-07-20 LAB — URINALYSIS, ROUTINE W REFLEX MICROSCOPIC
Bilirubin Urine: NEGATIVE
Glucose, UA: NEGATIVE mg/dL
Hgb urine dipstick: NEGATIVE
KETONES UR: NEGATIVE mg/dL
LEUKOCYTES UA: NEGATIVE
Nitrite: NEGATIVE
PROTEIN: NEGATIVE mg/dL
Specific Gravity, Urine: 1.023 (ref 1.005–1.030)
Urobilinogen, UA: 0.2 mg/dL (ref 0.0–1.0)
pH: 5.5 (ref 5.0–8.0)

## 2013-07-20 LAB — POCT PREGNANCY, URINE: Preg Test, Ur: NEGATIVE

## 2013-07-20 LAB — POCT I-STAT TROPONIN I: Troponin i, poc: 0 ng/mL (ref 0.00–0.08)

## 2013-07-20 MED ORDER — KETOROLAC TROMETHAMINE 30 MG/ML IJ SOLN
30.0000 mg | Freq: Once | INTRAMUSCULAR | Status: AC
  Administered 2013-07-20: 30 mg via INTRAVENOUS
  Filled 2013-07-20: qty 1

## 2013-07-20 MED ORDER — ONDANSETRON HCL 4 MG/2ML IJ SOLN
4.0000 mg | Freq: Once | INTRAMUSCULAR | Status: AC
  Administered 2013-07-20: 4 mg via INTRAVENOUS
  Filled 2013-07-20: qty 2

## 2013-07-20 MED ORDER — SODIUM CHLORIDE 0.9 % IV BOLUS (SEPSIS)
1000.0000 mL | Freq: Once | INTRAVENOUS | Status: AC
  Administered 2013-07-20: 1000 mL via INTRAVENOUS

## 2013-07-20 NOTE — ED Notes (Signed)
Pt reports generalized body aches x 5 days with 1 episode of emesis yesterday and fever (unsure of how high). Denies cough, SOB. States she has been taking prednisone and percocet that she normally takes for her lupus, but has no relief to this pain. NAD. VSS.

## 2013-07-20 NOTE — ED Provider Notes (Signed)
CSN: [id number]     Arrival date & time 07/20/13  1909 History   None    Chief Complaint  Patient presents with  . Generalized Body Aches  . Cough  . Headache  . Fever  . Pleurisy   (Consider location/radiation/quality/duration/timing/severity/associated sxs/prior Treatment) HPI Comments: Patient with a competent medical history including rheumatoid arthritis, chronic pain disorder, obesity, chronic steroid use, psoriasis, and a lupus, states, that for the past 5, days.  She's had increased pain, despite the use of her oxycodone, and steroids.  She, states she had a subjective fever.  Yesterday she had one episode of vomiting yesterday.  She has a cough and headache. Denies any dysuria, diarrhea, sore throat, ill contacts.  States she tried calling her doctor, but he didn't answer the phone, so she came here  Patient is a 28 y.o. female presenting with cough, headaches, and fever. The history is provided by the patient.  Cough Cough characteristics:  Non-productive Severity:  Moderate Onset quality:  Gradual Duration:  5 days Timing:  Intermittent Progression:  Unchanged Chronicity:  New Relieved by:  Nothing Worsened by:  Nothing tried Ineffective treatments:  Opioids Associated symptoms: fever, headaches and myalgias   Associated symptoms: no chills, no rash, no rhinorrhea, no shortness of breath, no sore throat and no wheezing   Fever:    Timing:  Sporadic   Temp source:  Subjective Headaches:    Severity:  Mild Headache Associated symptoms: cough, fever and myalgias   Associated symptoms: no abdominal pain, no diarrhea, no dizziness and no sore throat   Fever Associated symptoms: cough, headaches and myalgias   Associated symptoms: no chills, no diarrhea, no rash, no rhinorrhea and no sore throat     Past Medical History  Diagnosis Date  . Lupus (systemic lupus erythematosus)   . Psoriasis   . Gestational diabetes   . Hypertension   . Rheumatoid arthritis(714.0)    . Chronic pain disorder   . Obesity   . Chronic steroid use   . Bronchitis    History reviewed. No pertinent past surgical history. Family History  Problem Relation Age of Onset  . Diabetes Mother   . Hypertension Mother    History  Substance Use Topics  . Smoking status: Never Smoker   . Smokeless tobacco: Never Used  . Alcohol Use: No   OB History   Grav Para Term Preterm Abortions TAB SAB Ect Mult Living                 Review of Systems  Constitutional: Positive for fever. Negative for chills.  HENT: Negative for rhinorrhea and sore throat.   Respiratory: Positive for cough. Negative for shortness of breath and wheezing.   Gastrointestinal: Negative for abdominal pain, diarrhea and constipation.  Musculoskeletal: Positive for myalgias.  Skin: Negative for rash.  Neurological: Positive for headaches. Negative for dizziness.    Allergies  Review of patient's allergies indicates no known allergies.  Home Medications   Current Outpatient Rx  Name  Route  Sig  Dispense  Refill  . azaTHIOprine (IMURAN) 50 MG tablet   Oral   Take 1 tablet (50 mg total) by mouth daily.   30 tablet   0   . ferrous sulfate 325 (65 FE) MG tablet   Oral   Take 325 mg by mouth daily with breakfast.         . FLUoxetine (PROZAC) 10 MG capsule   Oral   Take 1 capsule (10 mg  total) by mouth daily.   30 capsule   0   . folic acid (FOLVITE) 1 MG tablet   Oral   Take 1 mg by mouth daily.         . hydroxychloroquine (PLAQUENIL) 200 MG tablet   Oral   Take 1 tablet (200 mg total) by mouth 2 (two) times daily.   60 tablet   0   . predniSONE (DELTASONE) 5 MG tablet   Oral   Take 1 tablet (5 mg total) by mouth 2 (two) times daily with a meal.   30 tablet   0   . traMADol (ULTRAM) 50 MG tablet   Oral   Take 50 mg by mouth every 12 (twelve) hours as needed for severe pain.          BP 141/79  Pulse 103  Temp(Src) 98 F (36.7 C) (Oral)  Resp 34  Wt 285 lb 8 oz  (129.502 kg)  SpO2 94%  LMP 07/13/2013 Physical Exam  Constitutional: She is oriented to person, place, and time. She appears well-developed and well-nourished.  Obese  HENT:  Right Ear: External ear normal.  Left Ear: External ear normal.  Mouth/Throat: Oropharynx is clear and moist.  Eyes: Pupils are equal, round, and reactive to light.  Neck: Normal range of motion.  Cardiovascular: Regular rhythm.  Tachycardia present.   Pulmonary/Chest: Breath sounds normal. She is in respiratory distress. She has no wheezes. She exhibits no tenderness.  Abdominal: Soft. Bowel sounds are normal.  Abdominal exam difficult due to body habitus  Musculoskeletal: Normal range of motion. She exhibits no edema and no tenderness.  Lymphadenopathy:    She has no cervical adenopathy.  Neurological: She is oriented to person, place, and time.  Skin: Skin is warm and dry. No rash noted. No erythema.    ED Course  Procedures (including critical care time) Labs Review Labs Reviewed  COMPREHENSIVE METABOLIC PANEL - Abnormal; Notable for the following:    Glucose, Bld 116 (*)    Creatinine, Ser 0.37 (*)    Albumin 3.3 (*)    Total Bilirubin 0.2 (*)    All other components within normal limits  CBC WITH DIFFERENTIAL  URINALYSIS, ROUTINE W REFLEX MICROSCOPIC  POCT I-STAT TROPONIN I  POCT PREGNANCY, URINE   Imaging Review Dg Chest 2 View  07/20/2013   CLINICAL DATA:  cough, fever, bronchitis, history of lupus  .  EXAM: CHEST  2 VIEW  COMPARISON:  06/14/2013  FINDINGS: low lung volumes noted with an elevated right hemidiaphragm. Mild cardiac enlargement with vascular congestion. Chronic appearing interstitial changes throughout both lungs. No definite superimposed pneumonia, collapse or consolidation. No effusion or pneumothorax. Trachea midline  IMPRESSION: Low volume exam with chronic interstitial changes. No definite acute process   Electronically Signed   By: Ruel Favors M.D.   On: 07/20/2013 20:14     EKG Interpretation   None       MDM   1. Viral illness     Patient.  Does not appear ill or toxic.  Labs are reviewed.  Chest x-ray, reviewed none of which are indicative of an acute infection.  Patient will be given IV fluids, Zofran, and Toradol for her discomfort    Arman Filter, NP 07/21/13 0045

## 2013-07-20 NOTE — ED Notes (Signed)
C/o flu like sx: cough, fever, HA, bodyaches, CP, nausea. Fever yesterday, vomited yesterday. Rates general pain 10/10. Cough productive (yellow).

## 2013-07-21 ENCOUNTER — Encounter (HOSPITAL_COMMUNITY): Payer: Self-pay | Admitting: Emergency Medicine

## 2013-07-21 MED ORDER — ACETAMINOPHEN 325 MG PO TABS
650.0000 mg | ORAL_TABLET | Freq: Once | ORAL | Status: AC
  Administered 2013-07-21: 650 mg via ORAL
  Filled 2013-07-21: qty 2

## 2013-07-21 NOTE — ED Notes (Signed)
Attempted to contact family for ride home. Husband called. Message left. Pt sleeping, arousable to voice, alert/sleepy, NAD, calm, interactive.

## 2013-07-21 NOTE — ED Notes (Signed)
EDNP updated on delay in d/c, unable to contact family.

## 2013-07-22 NOTE — ED Provider Notes (Signed)
Medical screening examination/treatment/procedure(s) were performed by non-physician practitioner and as supervising physician I was immediately available for consultation/collaboration.  EKG Interpretation    Date/Time:  Sunday July 20 2013 19:16:17 EST Ventricular Rate:  108 PR Interval:  136 QRS Duration: 84 QT Interval:  362 QTC Calculation: 485 R Axis:   53 Text Interpretation:  Sinus tachycardia Otherwise normal ECG ED PHYSICIAN INTERPRETATION AVAILABLE IN [facility] Confirmed by TEST, RECORD ([id number]) on 07/22/2013 9:28:40 AM              Shanna Cisco, MD 07/22/13 9842

## 2013-08-14 ENCOUNTER — Encounter (HOSPITAL_COMMUNITY): Payer: Self-pay | Admitting: Emergency Medicine

## 2013-08-14 ENCOUNTER — Emergency Department (HOSPITAL_COMMUNITY): Payer: Self-pay

## 2013-08-14 ENCOUNTER — Emergency Department (HOSPITAL_COMMUNITY)
Admission: EM | Admit: 2013-08-14 | Discharge: 2013-08-14 | Disposition: A | Discharge status: Home / Self Care | Payer: Self-pay | Attending: Emergency Medicine | Admitting: Emergency Medicine

## 2013-08-14 DIAGNOSIS — Z8709 Personal history of other diseases of the respiratory system: Secondary | ICD-10-CM | POA: Insufficient documentation

## 2013-08-14 DIAGNOSIS — Z8632 Personal history of gestational diabetes: Secondary | ICD-10-CM | POA: Insufficient documentation

## 2013-08-14 DIAGNOSIS — R0989 Other specified symptoms and signs involving the circulatory and respiratory systems: Secondary | ICD-10-CM | POA: Insufficient documentation

## 2013-08-14 DIAGNOSIS — R52 Pain, unspecified: Secondary | ICD-10-CM | POA: Insufficient documentation

## 2013-08-14 DIAGNOSIS — G894 Chronic pain syndrome: Secondary | ICD-10-CM | POA: Insufficient documentation

## 2013-08-14 DIAGNOSIS — I10 Essential (primary) hypertension: Secondary | ICD-10-CM | POA: Insufficient documentation

## 2013-08-14 DIAGNOSIS — E669 Obesity, unspecified: Secondary | ICD-10-CM | POA: Insufficient documentation

## 2013-08-14 DIAGNOSIS — M329 Systemic lupus erythematosus, unspecified: Secondary | ICD-10-CM | POA: Insufficient documentation

## 2013-08-14 DIAGNOSIS — M069 Rheumatoid arthritis, unspecified: Secondary | ICD-10-CM | POA: Insufficient documentation

## 2013-08-14 DIAGNOSIS — IMO0002 Reserved for concepts with insufficient information to code with codable children: Secondary | ICD-10-CM | POA: Insufficient documentation

## 2013-08-14 DIAGNOSIS — R0609 Other forms of dyspnea: Secondary | ICD-10-CM | POA: Insufficient documentation

## 2013-08-14 DIAGNOSIS — R000 Tachycardia, unspecified: Secondary | ICD-10-CM | POA: Insufficient documentation

## 2013-08-14 DIAGNOSIS — Z79899 Other long term (current) drug therapy: Secondary | ICD-10-CM | POA: Insufficient documentation

## 2013-08-14 DIAGNOSIS — Z872 Personal history of diseases of the skin and subcutaneous tissue: Secondary | ICD-10-CM | POA: Insufficient documentation

## 2013-08-14 DIAGNOSIS — R05 Cough: Secondary | ICD-10-CM

## 2013-08-14 DIAGNOSIS — R059 Cough, unspecified: Secondary | ICD-10-CM | POA: Insufficient documentation

## 2013-08-14 LAB — CBC WITH DIFFERENTIAL/PLATELET
Basophils Absolute: 0 10*3/uL (ref 0.0–0.1)
Basophils Relative: 0 % (ref 0–1)
Eosinophils Absolute: 0.1 10*3/uL (ref 0.0–0.7)
Eosinophils Relative: 2 % (ref 0–5)
HEMATOCRIT: 43.6 % (ref 36.0–46.0)
Hemoglobin: 14.6 g/dL (ref 12.0–15.0)
LYMPHS ABS: 2.3 10*3/uL (ref 0.7–4.0)
LYMPHS PCT: 28 % (ref 12–46)
MCH: 29.7 pg (ref 26.0–34.0)
MCHC: 33.5 g/dL (ref 30.0–36.0)
MCV: 88.8 fL (ref 78.0–100.0)
MONO ABS: 0.2 10*3/uL (ref 0.1–1.0)
MONOS PCT: 2 % — AB (ref 3–12)
Neutro Abs: 5.5 10*3/uL (ref 1.7–7.7)
Neutrophils Relative %: 68 % (ref 43–77)
Platelets: 270 10*3/uL (ref 150–400)
RBC: 4.91 MIL/uL (ref 3.87–5.11)
RDW: 13.2 % (ref 11.5–15.5)
WBC: 8.1 10*3/uL (ref 4.0–10.5)

## 2013-08-14 LAB — I-STAT CG4 LACTIC ACID, ED: Lactic Acid, Venous: 0.8 mmol/L (ref 0.5–2.2)

## 2013-08-14 LAB — URINALYSIS, ROUTINE W REFLEX MICROSCOPIC
BILIRUBIN URINE: NEGATIVE
Glucose, UA: NEGATIVE mg/dL
Ketones, ur: NEGATIVE mg/dL
LEUKOCYTES UA: NEGATIVE
Nitrite: NEGATIVE
PH: 5.5 (ref 5.0–8.0)
Protein, ur: NEGATIVE mg/dL
Specific Gravity, Urine: 1.019 (ref 1.005–1.030)
Urobilinogen, UA: 0.2 mg/dL (ref 0.0–1.0)

## 2013-08-14 LAB — COMPREHENSIVE METABOLIC PANEL
ALK PHOS: 85 U/L (ref 39–117)
ALT: 17 U/L (ref 0–35)
AST: 20 U/L (ref 0–37)
Albumin: 3.2 g/dL — ABNORMAL LOW (ref 3.5–5.2)
BUN: 10 mg/dL (ref 6–23)
CALCIUM: 8.4 mg/dL (ref 8.4–10.5)
CO2: 26 meq/L (ref 19–32)
CREATININE: 0.5 mg/dL (ref 0.50–1.10)
Chloride: 104 mEq/L (ref 96–112)
GFR calc Af Amer: >90 mL/min (ref >90)
GFR calc non Af Amer: >90 mL/min (ref >90)
GLUCOSE: 88 mg/dL (ref 70–99)
Potassium: 3.8 mEq/L (ref 3.7–5.3)
Sodium: 144 mEq/L (ref 137–147)
Total Bilirubin: 0.5 mg/dL (ref 0.3–1.2)
Total Protein: 7.2 g/dL (ref 6.0–8.3)

## 2013-08-14 LAB — URINE MICROSCOPIC-ADD ON

## 2013-08-14 LAB — POC URINE PREG, ED: Preg Test, Ur: NEGATIVE

## 2013-08-14 MED ORDER — OXYCODONE-ACETAMINOPHEN 10-325 MG PO TABS: 1.0000 | ORAL_TABLET | Freq: Three times a day (TID) | ORAL | Status: DC | PRN

## 2013-08-14 MED ORDER — KETOROLAC TROMETHAMINE 30 MG/ML IJ SOLN
30.0000 mg | Freq: Once | INTRAMUSCULAR | Status: AC
  Administered 2013-08-14: 30 mg via INTRAVENOUS
  Filled 2013-08-14: qty 1

## 2013-08-14 MED ORDER — AZATHIOPRINE 50 MG PO TABS: 50.0000 mg | ORAL_TABLET | Freq: Every day | ORAL | Status: DC

## 2013-08-14 MED ORDER — OXYCODONE-ACETAMINOPHEN 5-325 MG PO TABS
1.0000 | ORAL_TABLET | Freq: Once | ORAL | Status: AC
  Administered 2013-08-14: 1 via ORAL
  Filled 2013-08-14: qty 1

## 2013-08-14 MED ORDER — SENNOSIDES-DOCUSATE SODIUM 8.6-50 MG PO TABS: 2.0000 | ORAL_TABLET | Freq: Every day | ORAL | Status: DC

## 2013-08-14 MED ORDER — SODIUM CHLORIDE 0.9 % IV BOLUS (SEPSIS)
1000.0000 mL | Freq: Once | INTRAVENOUS | Status: AC
  Administered 2013-08-14: 1000 mL via INTRAVENOUS

## 2013-08-14 MED ORDER — HYDROMORPHONE HCL PF 1 MG/ML IJ SOLN
1.0000 mg | INTRAMUSCULAR | Status: DC | PRN
  Administered 2013-08-14: 1 mg via INTRAVENOUS
  Filled 2013-08-14: qty 1

## 2013-08-14 MED ORDER — HYDROCOD POLST-CHLORPHEN POLST 10-8 MG/5ML PO LQCR
5.0000 mL | Freq: Once | ORAL | Status: AC
  Administered 2013-08-14: 5 mL via ORAL
  Filled 2013-08-14: qty 5

## 2013-08-14 MED ORDER — ALBUTEROL SULFATE (2.5 MG/3ML) 0.083% IN NEBU
5.0000 mg | INHALATION_SOLUTION | Freq: Once | RESPIRATORY_TRACT | Status: AC
  Administered 2013-08-14: 5 mg via RESPIRATORY_TRACT
  Filled 2013-08-14: qty 6

## 2013-08-14 MED ORDER — ONDANSETRON HCL 4 MG/2ML IJ SOLN
4.0000 mg | Freq: Once | INTRAMUSCULAR | Status: AC
  Administered 2013-08-14: 4 mg via INTRAVENOUS
  Filled 2013-08-14: qty 2

## 2013-08-14 NOTE — ED Provider Notes (Signed)
CSN: [id number]     Arrival date & time 08/14/13  2878 History   First MD Initiated Contact with Patient 08/14/13 [phone]     Chief Complaint  Patient presents with  . Abdominal Pain  . Cough      HPI  Patient presents with pain in multiple areas. This episode began several days ago, without precipitant.  Since onset she said pain in her abdomen, chest, back and head. Pain is sore, severe, not improved with home narcotic use.  There is no associated confusion, disorientation, syncope.  There is mild associated dyspnea. No fever, no chills, no vomiting, no diarrhea.   Past Medical History  Diagnosis Date  . Lupus (systemic lupus erythematosus)   . Psoriasis   . Gestational diabetes   . Hypertension   . Rheumatoid arthritis(714.0)   . Chronic pain disorder   . Obesity   . Chronic steroid use   . Bronchitis    History reviewed. No pertinent past surgical history. Family History  Problem Relation Age of Onset  . Diabetes Mother   . Hypertension Mother    History  Substance Use Topics  . Smoking status: Never Smoker   . Smokeless tobacco: Never Used  . Alcohol Use: No   OB History   Grav Para Term Preterm Abortions TAB SAB Ect Mult Living                 Review of Systems  Constitutional:       Per HPI, otherwise negative  HENT:       Per HPI, otherwise negative  Respiratory:       Per HPI, otherwise negative  Cardiovascular:       Per HPI, otherwise negative  Gastrointestinal: Negative for vomiting.  Endocrine:       Negative aside from HPI  Genitourinary:       Neg aside from HPI   Musculoskeletal:       Per HPI, otherwise negative  Skin: Negative.   Neurological: Negative for syncope.      Allergies  Review of patient's allergies indicates no known allergies.  Home Medications   Current Outpatient Rx  Name  Route  Sig  Dispense  Refill  . ferrous sulfate 325 (65 FE) MG tablet   Oral   Take 325 mg by mouth daily with breakfast.         .  FLUoxetine (PROZAC) 10 MG capsule   Oral   Take 1 capsule (10 mg total) by mouth daily.   30 capsule   0   . folic acid (FOLVITE) 1 MG tablet   Oral   Take 1 mg by mouth daily.         . hydroxychloroquine (PLAQUENIL) 200 MG tablet   Oral   Take 1 tablet (200 mg total) by mouth 2 (two) times daily.   60 tablet   0   . predniSONE (DELTASONE) 5 MG tablet   Oral   Take 20 mg by mouth daily with breakfast. For 15 days. Filled on 08/04/13         . traMADol (ULTRAM) 50 MG tablet   Oral   Take 50 mg by mouth every 12 (twelve) hours as needed for severe pain.          BP 102/60  Pulse 109  Temp(Src) 98.6 F (37 C) (Oral)  Resp 20  SpO2 93%  LMP 07/13/2013 Physical Exam  Nursing note and vitals reviewed. Constitutional: She is oriented to person,  place, and time. She appears well-developed and well-nourished. No distress.  Obese young F, awake, alert, interactive (in [place])  HENT:  Head: Normocephalic and atraumatic.  Eyes: Conjunctivae and EOM are normal.  Cardiovascular: Regular rhythm.  Tachycardia present.   Pulmonary/Chest: Effort normal and breath sounds normal. No stridor. No respiratory distress.  Abdominal: She exhibits no distension. There is no tenderness. There is no rebound.  Large non-distended abdomen  Musculoskeletal: She exhibits no edema.  Neurological: She is alert and oriented to person, place, and time. No cranial nerve deficit.  Skin: Skin is warm and dry.  Psychiatric: She has a normal mood and affect.    ED Course  Procedures (including critical care time) Labs Review Labs Reviewed  CBC WITH DIFFERENTIAL - Abnormal; Notable for the following:    Monocytes Relative 2 (*)    All other components within normal limits  COMPREHENSIVE METABOLIC PANEL - Abnormal; Notable for the following:    Albumin 3.2 (*)    All other components within normal limits  URINALYSIS, ROUTINE W REFLEX MICROSCOPIC - Abnormal; Notable for the following:    Color,  Urine AMBER (*)    Hgb urine dipstick MODERATE (*)    All other components within normal limits  URINE MICROSCOPIC-ADD ON  POC URINE PREG, ED  I-STAT CG4 LACTIC ACID, ED   Imaging Review Dg Chest 2 View  08/14/2013   CLINICAL DATA:  Chest pain, weakness, history hypertension, lupus, rheumatoid arthritis  EXAM: CHEST  2 VIEW  COMPARISON:  07/20/2013  FINDINGS: Enlargement of cardiac silhouette.  Persistent elevation of right diaphragm.  Stable mediastinal contours.  Bronchitic changes with right basilar atelectasis.  Chronic accentuation of interstitial markings in the perihilar regions in upper lobes likely related to chronic interstitial lung disease, unchanged.  No definite acute infiltrate, pleural effusion or pneumothorax.  Bones unremarkable.  IMPRESSION: Enlargement of cardiac silhouette.  Low lung volumes with chronic bronchitic and interstitial lung disease changes.  No acute abnormalities.   Electronically Signed   By: Ulyses Southward M.D.   On: 08/14/2013 13:01    I reviewed the EMR  2:14 PM Patient resting, seemingly comfortable.  We discussed the results in Spanish.  She has a physician with whom she can follow-up. She requests med refills, and something for constipation.  I explained the side-effects of chronic narcotic use to her.  MDM   This patient with multiple medical problems, including lupus, obesity, chronic narcotic and steroid use presents with multiple complaints.  On exam she is awake, alert, and in no distress.  She is tachycardic.  Patient is not currently taking her medication for lupus.  Patient's labs were largely reassuring, with no evidence of pneumonia, systemic infection.  Patient improved here, was resting comfortably on repeat exam.  With the improvement in her condition, and her well-documented history of chronic pain, she was discharged in a stable condition to followup with her primary care team.  Gerhard Munch, MD 08/14/13 1419

## 2013-08-14 NOTE — ED Notes (Signed)
Results of lactic acid shown to Dr. Lockwood 

## 2013-08-14 NOTE — Discharge Instructions (Signed)
Necesitas llamar tu doctor para hablar de esta visita en la sala de emergencias.  Regresa aqui por problemas o si hay algo que parece peligroso.

## 2013-08-26 ENCOUNTER — Emergency Department (HOSPITAL_COMMUNITY): Payer: Self-pay

## 2013-08-26 ENCOUNTER — Emergency Department (HOSPITAL_COMMUNITY)
Admission: EM | Admit: 2013-08-26 | Discharge: 2013-08-26 | Disposition: A | Discharge status: Home / Self Care | Payer: Self-pay | Attending: Emergency Medicine | Admitting: Emergency Medicine

## 2013-08-26 ENCOUNTER — Encounter (HOSPITAL_COMMUNITY): Payer: Self-pay | Admitting: Emergency Medicine

## 2013-08-26 ENCOUNTER — Other Ambulatory Visit (HOSPITAL_COMMUNITY): Payer: Self-pay | Admitting: Internal Medicine

## 2013-08-26 DIAGNOSIS — G8929 Other chronic pain: Secondary | ICD-10-CM

## 2013-08-26 DIAGNOSIS — M069 Rheumatoid arthritis, unspecified: Secondary | ICD-10-CM | POA: Insufficient documentation

## 2013-08-26 DIAGNOSIS — R000 Tachycardia, unspecified: Secondary | ICD-10-CM | POA: Insufficient documentation

## 2013-08-26 DIAGNOSIS — IMO0002 Reserved for concepts with insufficient information to code with codable children: Secondary | ICD-10-CM | POA: Insufficient documentation

## 2013-08-26 DIAGNOSIS — Z760 Encounter for issue of repeat prescription: Secondary | ICD-10-CM

## 2013-08-26 DIAGNOSIS — Z862 Personal history of diseases of the blood and blood-forming organs and certain disorders involving the immune mechanism: Secondary | ICD-10-CM | POA: Insufficient documentation

## 2013-08-26 DIAGNOSIS — R0789 Other chest pain: Secondary | ICD-10-CM | POA: Insufficient documentation

## 2013-08-26 DIAGNOSIS — R079 Chest pain, unspecified: Secondary | ICD-10-CM

## 2013-08-26 DIAGNOSIS — I10 Essential (primary) hypertension: Secondary | ICD-10-CM | POA: Insufficient documentation

## 2013-08-26 DIAGNOSIS — Z3202 Encounter for pregnancy test, result negative: Secondary | ICD-10-CM | POA: Insufficient documentation

## 2013-08-26 DIAGNOSIS — Z872 Personal history of diseases of the skin and subcutaneous tissue: Secondary | ICD-10-CM | POA: Insufficient documentation

## 2013-08-26 DIAGNOSIS — Z79899 Other long term (current) drug therapy: Secondary | ICD-10-CM | POA: Insufficient documentation

## 2013-08-26 DIAGNOSIS — Z8639 Personal history of other endocrine, nutritional and metabolic disease: Secondary | ICD-10-CM | POA: Insufficient documentation

## 2013-08-26 DIAGNOSIS — M329 Systemic lupus erythematosus, unspecified: Secondary | ICD-10-CM

## 2013-08-26 LAB — COMPREHENSIVE METABOLIC PANEL
ALBUMIN: 3.6 g/dL (ref 3.5–5.2)
ALT: 19 U/L (ref 0–35)
AST: 28 U/L (ref 0–37)
Alkaline Phosphatase: 86 U/L (ref 39–117)
BILIRUBIN TOTAL: 0.5 mg/dL (ref 0.3–1.2)
BUN: 11 mg/dL (ref 6–23)
CHLORIDE: 102 meq/L (ref 96–112)
CO2: 29 mEq/L (ref 19–32)
CREATININE: 0.53 mg/dL (ref 0.50–1.10)
Calcium: 8.6 mg/dL (ref 8.4–10.5)
GFR calc Af Amer: >90 mL/min (ref >90)
Glucose, Bld: 110 mg/dL — ABNORMAL HIGH (ref 70–99)
Potassium: 3.7 mEq/L (ref 3.7–5.3)
Sodium: 143 mEq/L (ref 137–147)
Total Protein: 7.2 g/dL (ref 6.0–8.3)

## 2013-08-26 LAB — CBC WITH DIFFERENTIAL/PLATELET
BASOS ABS: 0 10*3/uL (ref 0.0–0.1)
BASOS PCT: 0 % (ref 0–1)
Eosinophils Absolute: 0.1 10*3/uL (ref 0.0–0.7)
Eosinophils Relative: 1 % (ref 0–5)
HCT: 45.4 % (ref 36.0–46.0)
Hemoglobin: 15 g/dL (ref 12.0–15.0)
Lymphocytes Relative: 23 % (ref 12–46)
Lymphs Abs: 1.7 10*3/uL (ref 0.7–4.0)
MCH: 29.1 pg (ref 26.0–34.0)
MCHC: 33 g/dL (ref 30.0–36.0)
MCV: 88 fL (ref 78.0–100.0)
Monocytes Absolute: 0.2 10*3/uL (ref 0.1–1.0)
Monocytes Relative: 2 % — ABNORMAL LOW (ref 3–12)
NEUTROS ABS: 5.6 10*3/uL (ref 1.7–7.7)
NEUTROS PCT: 74 % (ref 43–77)
Platelets: 249 10*3/uL (ref 150–400)
RBC: 5.16 MIL/uL — ABNORMAL HIGH (ref 3.87–5.11)
RDW: 13.1 % (ref 11.5–15.5)
WBC: 7.5 10*3/uL (ref 4.0–10.5)

## 2013-08-26 LAB — I-STAT TROPONIN, ED: Troponin i, poc: 0 ng/mL (ref 0.00–0.08)

## 2013-08-26 LAB — URINALYSIS, ROUTINE W REFLEX MICROSCOPIC
GLUCOSE, UA: NEGATIVE mg/dL
Ketones, ur: 15 mg/dL — AB
LEUKOCYTES UA: NEGATIVE
Nitrite: NEGATIVE
Protein, ur: NEGATIVE mg/dL
Specific Gravity, Urine: 1.028 (ref 1.005–1.030)
UROBILINOGEN UA: 0.2 mg/dL (ref 0.0–1.0)
pH: 5.5 (ref 5.0–8.0)

## 2013-08-26 LAB — URINE MICROSCOPIC-ADD ON

## 2013-08-26 LAB — POC URINE PREG, ED: PREG TEST UR: NEGATIVE

## 2013-08-26 MED ORDER — OXYCODONE-ACETAMINOPHEN 5-325 MG PO TABS: 1.0000 | ORAL_TABLET | Freq: Four times a day (QID) | ORAL | Status: DC | PRN

## 2013-08-26 MED ORDER — SODIUM CHLORIDE 0.9 % IV BOLUS (SEPSIS)
1000.0000 mL | Freq: Once | INTRAVENOUS | Status: AC
  Administered 2013-08-26: 1000 mL via INTRAVENOUS

## 2013-08-26 MED ORDER — HYDROMORPHONE HCL PF 1 MG/ML IJ SOLN
1.0000 mg | Freq: Once | INTRAMUSCULAR | Status: AC
  Administered 2013-08-26: 1 mg via INTRAVENOUS
  Filled 2013-08-26: qty 1

## 2013-08-26 MED ORDER — PREDNISONE 20 MG PO TABS
20.0000 mg | ORAL_TABLET | Freq: Once | ORAL | Status: AC
  Administered 2013-08-26: 20 mg via ORAL
  Filled 2013-08-26: qty 1

## 2013-08-26 MED ORDER — PREDNISONE 5 MG PO TABS: 20.0000 mg | ORAL_TABLET | Freq: Every day | ORAL | Status: DC

## 2013-08-26 MED ORDER — MORPHINE SULFATE 4 MG/ML IJ SOLN
4.0000 mg | Freq: Once | INTRAMUSCULAR | Status: AC
  Administered 2013-08-26: 4 mg via INTRAVENOUS
  Filled 2013-08-26: qty 1

## 2013-08-26 NOTE — Discharge Instructions (Signed)
Dolor de la pared torácica  (Chest Wall Pain)  Dolor en la pared torácica es dolor en o alrededor de los huesos y músculos de su pecho. Podrán pasar hasta 6 semanas hasta que comience a mejorar. Puede demorar más tiempo si es físicamente activo en su trabajo y actividades.   CAUSAS   El dolor en el pecho puede aparecer sin motivo. No obstante, algunas causas pueden ser:   · Una enfermedad viral como la gripe.  · Traumatismos.  · Tos.  · La práctica de ejercicios.  · Artritis.  · Fibromialgia  · Culebrilla.  INSTRUCCIONES PARA EL CUIDADO DOMICILIARIO  · Evite hacer actividad física extenuante. Trate de no esforzarse o realizar actividades que le causen dolor. Aquí se incluyen las actividades en las que usa los músculos del tórax, los abdominales y los músculos laterales, especialmente si debe levantar objetos pesados.  · Aplique hielo sobre la zona dolorida.  · Ponga el hielo en una bolsa plástica.  · Colóquese una toalla entre la piel y la bolsa de hielo.  · Deje la bolsa de hielo durante 15 a 20 minutos por hora, durante los primeros 2 días.  · Utilice los medicamentos de venta libre o de prescripción para el dolor, el malestar o la fiebre, según se lo indique el profesional que lo asiste.  SOLICITE ATENCIÓN MÉDICA DE INMEDIATO SI:  · El dolor aumenta o siente muchas molestias.  · Tiene fiebre.  · El dolor de pecho empeora.  · Desarrolla nuevos e inexplicables síntomas.  · Tiene náuseas o vómitos.  · Transpira o se siente mareado.  · Tiene tos con flema (esputo), o tose con sangre.  ESTÉ SEGURO QUE:   · Comprende las instrucciones para el alta médica.  · Controlará su enfermedad.  · Solicitará atención médica de inmediato según las indicaciones.  Document Released: 07/24/2006 Document Revised: 09/04/2011  Exit[employer]® Patient Information ©2014 [employer].

## 2013-08-26 NOTE — ED Notes (Addendum)
Cp since yesterday and h/a coughing  X 2 days sore throat no vomitingg or diarrhea states has been hard to have bm

## 2013-08-26 NOTE — ED Provider Notes (Signed)
Medical screening examination/treatment/procedure(s) were performed by non-physician practitioner and as supervising physician I was immediately available for consultation/collaboration.  Rakesha Dalporto L Jedadiah Abdallah, MD 08/26/13 2039 

## 2013-08-26 NOTE — ED Provider Notes (Signed)
CSN: [id number]     Arrival date & time 08/26/13  1459 History   First MD Initiated Contact with Patient 08/26/13 1653     Chief Complaint  Patient presents with  . Chest Pain     (Consider location/radiation/quality/duration/timing/severity/associated sxs/prior Treatment) HPI  Patient is spanish speaking and interview done with phone interpretor   Donna Hall, 27yo, with a PMH of lupus, chronic pain disorder, chronic steroid use, HTN and RA comes in to the ED with complaints of chest pain X 3 days. She states the chest pain is sharp in nature and radiates across the chest, to the back, and has been present for approximately 1 month. She states having a constant cough for just as long and has had shortness of breath for three days.  She also states that she was on antibiotics for a lung infection recently. She denies any abdominal pain, N/V, diarrhea, recent fever, chills, or history of blood clots. She was last seen by her regular doctor 5 months ago for her lupus for which she is on chronic prednisone.  She states that she has run out of her prednisone a few days ago and that these symptoms tend to occur when she is not on it.   Past Medical History  Diagnosis Date  . Lupus (systemic lupus erythematosus)   . Psoriasis   . Gestational diabetes   . Hypertension   . Rheumatoid arthritis(714.0)   . Chronic pain disorder   . Obesity   . Chronic steroid use   . Bronchitis    History reviewed. No pertinent past surgical history. Family History  Problem Relation Age of Onset  . Diabetes Mother   . Hypertension Mother    History  Substance Use Topics  . Smoking status: Never Smoker   . Smokeless tobacco: Never Used  . Alcohol Use: No   OB History   Grav Para Term Preterm Abortions TAB SAB Ect Mult Living                 Review of Systems The patient denies anorexia, fever, weight loss,, vision loss, decreased hearing, hoarseness, syncope, dyspnea on exertion,  peripheral edema, balance deficits, hemoptysis, abdominal pain, melena, hematochezia, severe indigestion/heartburn, hematuria, incontinence, genital sores, muscle weakness, suspicious skin lesions, transient blindness, difficulty walking, depression, unusual weight change, abnormal bleeding, enlarged lymph nodes, angioedema, and breast masses.    Allergies  Review of patient's allergies indicates no known allergies.  Home Medications   Current Outpatient Rx  Name  Route  Sig  Dispense  Refill  . azaTHIOprine (IMURAN) 50 MG tablet   Oral   Take 1 tablet (50 mg total) by mouth daily.   30 tablet   0   . benzonatate (TESSALON) 100 MG capsule   Oral   Take 100 mg by mouth every 8 (eight) hours. Take for 7 days only patient has 5 pills left as of 08-26-13         . ferrous sulfate 325 (65 FE) MG tablet   Oral   Take 325 mg by mouth daily with breakfast.         . folic acid (FOLVITE) 1 MG tablet   Oral   Take 1 mg by mouth daily.         . hydroxychloroquine (PLAQUENIL) 200 MG tablet   Oral   Take 1 tablet (200 mg total) by mouth 2 (two) times daily.   60 tablet   0   . oxyCODONE-acetaminophen (PERCOCET) 10-325  MG per tablet   Oral   Take 1 tablet by mouth every 8 (eight) hours as needed for pain.   15 tablet   0   . oxyCODONE-acetaminophen (PERCOCET/ROXICET) 5-325 MG per tablet   Oral   Take 1 tablet by mouth every 6 (six) hours as needed for severe pain.   15 tablet   0   . predniSONE (DELTASONE) 5 MG tablet   Oral   Take 4 tablets (20 mg total) by mouth daily.   30 tablet   0    BP 110/58  Pulse 101  Temp(Src) 97.5 F (36.4 C)  Resp 21  Wt 292 lb (132.45 kg)  SpO2 95%  LMP 08/26/2013 Physical Exam  Nursing note and vitals reviewed. Constitutional: She appears well-developed and well-nourished. No distress.  Morbidly obese  HENT:  Head: Normocephalic and atraumatic.  Eyes: Pupils are equal, round, and reactive to light.  Neck: Normal range of  motion. Neck supple.  Cardiovascular: Regular rhythm.  Tachycardia present.   Pulmonary/Chest: Effort normal and breath sounds normal. She has no decreased breath sounds. She has no wheezes. She has no rhonchi. She has no rales.  Abdominal: Soft.  Neurological: She is alert.  Skin: Skin is warm and dry.    ED Course  Procedures (including critical care time) Labs Review Labs Reviewed  COMPREHENSIVE METABOLIC PANEL - Abnormal; Notable for the following:    Glucose, Bld 110 (*)    All other components within normal limits  URINALYSIS, ROUTINE W REFLEX MICROSCOPIC - Abnormal; Notable for the following:    Color, Urine AMBER (*)    Hgb urine dipstick SMALL (*)    Bilirubin Urine SMALL (*)    Ketones, ur 15 (*)    All other components within normal limits  CBC WITH DIFFERENTIAL - Abnormal; Notable for the following:    RBC 5.16 (*)    Monocytes Relative 2 (*)    All other components within normal limits  URINE MICROSCOPIC-ADD ON - Abnormal; Notable for the following:    Bacteria, UA FEW (*)    All other components within normal limits  POC URINE PREG, ED  I-STAT TROPOININ, ED   Imaging Review Dg Chest 2 View  08/26/2013   CLINICAL DATA:  Shortness of breath and chest pain  EXAM: CHEST  2 VIEW  COMPARISON:  August 14, 2013  FINDINGS: There is focal persisted consolidation in the anterior segment of the left upper lobe. Elsewhere lungs are clear. The heart size is upper normal with normal pulmonary vascularity. No adenopathy. No bone lesions.  IMPRESSION: Anterior segment left upper lobe infiltrate, persistent.   Electronically Signed   By: Bretta Bang M.D.   On: 08/26/2013 15:45     EKG Interpretation   Date/Time:  Tuesday August 26 2013 15:04:54 EST Ventricular Rate:  111 PR Interval:  132 QRS Duration: 86 QT Interval:  358 QTC Calculation: 486 R Axis:   68 Text Interpretation:  Sinus tachycardia Cannot rule out Inferior infarct ,  age undetermined Abnormal ECG  Artifact since last tracing no significant  change Confirmed by Effie Shy  MD, ELLIOTT [id number]) on 08/26/2013 5:09:51 PM      MDM   Final diagnoses:  Exacerbation of systemic lupus  Medication refill  Chest pain  Chronic pain    Discussed case with Dr. Effie Shy, Will control patients pain in the ER and give her dose of prednisone. Will also refill patients prednisone as she has run out and encourage her to  follow-up with her  PCP. She is currently tachycardic, which I note from all of her visits. However, will fluid bolus her to see if I can gain better rate control.  Pt given 1 mg Dilaudid and expresses he pain is much improved. Will restart her on prednisone 5mg  daily (given a 20 mg dose in the ED) and have her follow-up with her rheumatologist. Also given rx for pain medication. Her pulse improved with fluids and is < 100 at discharge  27 y.o. Hall's evaluation in the Emergency Department is complete. It has been determined that no acute conditions requiring further emergency intervention are present at this time. The patient/guardian have been advised of the diagnosis and plan. We have discussed signs and symptoms that warrant return to the ED, such as changes or worsening in symptoms.  Vital signs are stable at discharge. Filed Vitals:   08/26/13 1845  BP: 110/58  Pulse: 101  Temp:   Resp: 21    Patient/guardian has voiced understanding and agreed to follow-up with the PCP or specialist.   10/26/13, PA-C 08/26/13 1903

## 2013-08-26 NOTE — ED Notes (Signed)
Interpreter phone used for discharge instructions. Pt comfortable with d/c and follow up instructions. Prescriptions x2.

## 2013-09-28 ENCOUNTER — Encounter (HOSPITAL_COMMUNITY): Payer: Self-pay | Admitting: Emergency Medicine

## 2013-09-28 DIAGNOSIS — G894 Chronic pain syndrome: Secondary | ICD-10-CM | POA: Insufficient documentation

## 2013-09-28 DIAGNOSIS — Z3202 Encounter for pregnancy test, result negative: Secondary | ICD-10-CM | POA: Insufficient documentation

## 2013-09-28 DIAGNOSIS — J159 Unspecified bacterial pneumonia: Secondary | ICD-10-CM | POA: Insufficient documentation

## 2013-09-28 DIAGNOSIS — E669 Obesity, unspecified: Secondary | ICD-10-CM | POA: Insufficient documentation

## 2013-09-28 DIAGNOSIS — G8929 Other chronic pain: Secondary | ICD-10-CM | POA: Insufficient documentation

## 2013-09-28 DIAGNOSIS — Z872 Personal history of diseases of the skin and subcutaneous tissue: Secondary | ICD-10-CM | POA: Insufficient documentation

## 2013-09-28 DIAGNOSIS — Z79899 Other long term (current) drug therapy: Secondary | ICD-10-CM | POA: Insufficient documentation

## 2013-09-28 DIAGNOSIS — I10 Essential (primary) hypertension: Secondary | ICD-10-CM | POA: Insufficient documentation

## 2013-09-28 DIAGNOSIS — Z8739 Personal history of other diseases of the musculoskeletal system and connective tissue: Secondary | ICD-10-CM | POA: Insufficient documentation

## 2013-09-28 DIAGNOSIS — IMO0002 Reserved for concepts with insufficient information to code with codable children: Secondary | ICD-10-CM | POA: Insufficient documentation

## 2013-09-28 NOTE — ED Notes (Signed)
Pt c/o body aches for several days.  St's started having left chest pain yesterday with cough

## 2013-09-29 ENCOUNTER — Emergency Department (HOSPITAL_COMMUNITY)
Admission: EM | Admit: 2013-09-29 | Discharge: 2013-09-29 | Disposition: A | Discharge status: Home / Self Care | Payer: Self-pay | Attending: Emergency Medicine | Admitting: Emergency Medicine

## 2013-09-29 ENCOUNTER — Emergency Department (HOSPITAL_COMMUNITY): Payer: Self-pay

## 2013-09-29 ENCOUNTER — Encounter (HOSPITAL_COMMUNITY): Payer: Self-pay | Admitting: Radiology

## 2013-09-29 DIAGNOSIS — J189 Pneumonia, unspecified organism: Secondary | ICD-10-CM

## 2013-09-29 LAB — I-STAT CHEM 8, ED
BUN: 14 mg/dL (ref 6–23)
CALCIUM ION: 1.16 mmol/L (ref 1.12–1.23)
Chloride: 102 mEq/L (ref 96–112)
Creatinine, Ser: 0.6 mg/dL (ref 0.50–1.10)
Glucose, Bld: 102 mg/dL — ABNORMAL HIGH (ref 70–99)
HCT: 47 % — ABNORMAL HIGH (ref 36.0–46.0)
HEMOGLOBIN: 16 g/dL — AB (ref 12.0–15.0)
Potassium: 3.7 mEq/L (ref 3.7–5.3)
SODIUM: 141 meq/L (ref 137–147)
TCO2: 27 mmol/L (ref 0–100)

## 2013-09-29 LAB — CBC WITH DIFFERENTIAL/PLATELET
BASOS ABS: 0 10*3/uL (ref 0.0–0.1)
Basophils Relative: 0 % (ref 0–1)
EOS PCT: 1 % (ref 0–5)
Eosinophils Absolute: 0.1 10*3/uL (ref 0.0–0.7)
HCT: 45.6 % (ref 36.0–46.0)
Hemoglobin: 15.3 g/dL — ABNORMAL HIGH (ref 12.0–15.0)
LYMPHS ABS: 2.3 10*3/uL (ref 0.7–4.0)
Lymphocytes Relative: 27 % (ref 12–46)
MCH: 29.2 pg (ref 26.0–34.0)
MCHC: 33.6 g/dL (ref 30.0–36.0)
MCV: 87 fL (ref 78.0–100.0)
Monocytes Absolute: 0.3 10*3/uL (ref 0.1–1.0)
Monocytes Relative: 4 % (ref 3–12)
Neutro Abs: 5.8 10*3/uL (ref 1.7–7.7)
Neutrophils Relative %: 68 % (ref 43–77)
PLATELETS: 277 10*3/uL (ref 150–400)
RBC: 5.24 MIL/uL — ABNORMAL HIGH (ref 3.87–5.11)
RDW: 13.3 % (ref 11.5–15.5)
WBC: 8.5 10*3/uL (ref 4.0–10.5)

## 2013-09-29 LAB — URINALYSIS, ROUTINE W REFLEX MICROSCOPIC
BILIRUBIN URINE: NEGATIVE
GLUCOSE, UA: NEGATIVE mg/dL
HGB URINE DIPSTICK: NEGATIVE
KETONES UR: NEGATIVE mg/dL
Leukocytes, UA: NEGATIVE
NITRITE: NEGATIVE
PH: 6.5 (ref 5.0–8.0)
Protein, ur: NEGATIVE mg/dL
Specific Gravity, Urine: 1.023 (ref 1.005–1.030)
Urobilinogen, UA: 0.2 mg/dL (ref 0.0–1.0)

## 2013-09-29 LAB — I-STAT TROPONIN, ED: Troponin i, poc: 0 ng/mL (ref 0.00–0.08)

## 2013-09-29 LAB — POC URINE PREG, ED: Preg Test, Ur: NEGATIVE

## 2013-09-29 MED ORDER — LEVOFLOXACIN 500 MG PO TABS: 500.0000 mg | ORAL_TABLET | Freq: Every day | ORAL | Status: DC

## 2013-09-29 MED ORDER — LEVOFLOXACIN 750 MG PO TABS
750.0000 mg | ORAL_TABLET | Freq: Once | ORAL | Status: AC
  Administered 2013-09-29: 750 mg via ORAL
  Filled 2013-09-29: qty 1

## 2013-09-29 MED ORDER — IOHEXOL 350 MG/ML SOLN
100.0000 mL | Freq: Once | INTRAVENOUS | Status: AC | PRN
  Administered 2013-09-29: 100 mL via INTRAVENOUS

## 2013-09-29 MED ORDER — FENTANYL CITRATE 0.05 MG/ML IJ SOLN
50.0000 ug | INTRAMUSCULAR | Status: DC | PRN
  Administered 2013-09-29 (×2): 50 ug via INTRAVENOUS
  Filled 2013-09-29 (×2): qty 2

## 2013-09-29 NOTE — ED Notes (Signed)
Pt reports she is in too much pain to ambulate at this time. Will try again soon.

## 2013-09-29 NOTE — ED Notes (Signed)
Discharge instructions reviewed with pt via interpretor phone services, pt states she understands the discharge instructions and follow-up care. MD aware of pt's pain at time of discharge. OK to discharge.

## 2013-09-29 NOTE — Discharge Instructions (Signed)
Neumonía en el adulto  (Pneumonia, Adult)  La neumonía es una infección en los pulmones.   CAUSAS  La causa puede ser un virus o una bacteria. Generalmente estas infecciones se producen por la aspiración de partículas infecciosas que ingresan a los pulmones (tracto respiratorio).  SÍNTOMAS  · Tos.  · Fiebre.  · Dolor en el pecho.  · Frecuencia respiratoria aumentada.  · Dificultad respiratoria.  · Producción de mucosidad.  DIAGNÓSTICO   Si presenta los síntomas comunes de la neumonía, normalmente el médico confirmará el diagnóstico con una radiografía de tórax. La radiografía mostrará la anormalidad del pulmón (infiltrados pulmonares) si tiene neumonía. Podrán realizarse otras pruebas de sangre, orina o esputo para encontrar la causa específica de su neumonía. El profesional que lo asiste le realizará pruebas (como gases en sangre o una oximetría de pulso) para verificar el correcto funcionamiento de los pulmones.  TRATAMIENTO  Algunos tipos de neumonía pueden contagiarse a otras personas al toser o estornudar. Es posible que le pidan que utilice una máscara antes y durante el examen. Si la neumonía es causada por una bacteria, puede tratarse con medicamentos antibióticos. Si la neumonía es causada por el virus de la influenza, puede tratarse con medicamentos antivirales. La mayoría de las demás infecciones virales deben seguir su curso. Estas infecciones no responderán a los antibióticos.   PREVENCIÓN  La vacuna antineumocóccica está disponible para prevenir la neumonía bacteriana común. Generalmente se aconseja su aplicación en:  · Personas mayores de 65 años de edad.  · Pacientes que están en tratamiento de quimioterapia.  · Personas con trastornos pulmonares crónicos, como bronquitis o enfisema.  · Personas con problemas del sistema inmunológico.  Si usted es mayor de 65 años de edad o tiene un trastorno que lo pone en situación de alto riesgo, es posible que reciba una vacuna antineumocócica. En algunos países,  también se recomienda la aplicación de rutina de la vacuna contra la influenza. Esta vacuna puede ayudar a prevenir algunos casos de neumonía. Es posible que le ofrezcan aplicarse la vacuna contra la influenza como parte del tratamiento.   Si es fumador, es el momento de abandonar el hábito. Podrá recibir instrucciones para facilitarle la decisión de dejar de fumar. Los profesionales pueden proporcionarle medicamentos y asesoramiento para ayudarlo.  INSTRUCCIONES PARA EL CUIDADO DOMICILIARIO  · Puede utilizar antitusivos si no puede descansar bien; sin embargo, la tos ayuda a despejar los pulmones. Debe evitar utilizar medicamentos para detener la tos, si puede.  · Es posible que el médico le haya prescrito medicamentos si cree que su neumonía es causada por una bacteria o por la influenza. Finalice la prescripción completa, aunque comience a sentirse mejor.  · El profesional también puede recomendarle o prescribirle un expectorante para aflojar la mucosidad y eliminarla con la tos.  · Utilice los medicamentos de venta libre o de prescripción para el dolor, el malestar o la fiebre, según se lo indique el profesional que lo asiste.  · No fume. El fumar es una de las causas más frecuentes de bronquitis y puede contribuir a la neumonía. Si es fumador y continúa fumando, la tos puede durar varias semanas después que la neumonía haya desaparecido.  · Un vaporizador o humidificador con vapor frío en la habitación o en la casa pueden ayudar a aflojar la mucosidad.  · La tos generalmente empeora por la noche. Duerma en posición semisentada en una reposera o use un par de almohadas debajo de la cabeza.  · Haga reposo todo el tiempo   que necesite. El organismo le hará saber cuando tiene que descansar.  SOLICITE ATENCIÓN MÉDICA DE INMEDIATO SI:  · La enfermedad empeora. Esto vale especialmente en el caso de que usted sea una persona mayor o se encuentre débil por otra enfermedad.  · No puede controlar la tos con medicamentos y  no puede dormir debido a ello.  · Comienza a escupir sangre al toser.  · Presenta dolor que empeora o no puede controlar con los medicamentos.  · Tiene fiebre.  · Si alguno de los síntomas que lo trajeron a la consulta empeoran en vez de mejorar.  · Siente falta de aire o dolor en el pecho.  ESTÉ SEGURO QUE:   · Comprende las instrucciones para el alta médica.  · Controlará su enfermedad.  · Solicitará atención médica de inmediato según las indicaciones.  Document Released: 03/22/2005 Document Revised: 09/04/2011  [employer]® Patient Information ©2014 [employer].

## 2013-09-29 NOTE — ED Provider Notes (Signed)
CSN: [id number]     Arrival date & time 09/28/13  2343 History   First MD Initiated Contact with Patient 09/29/13 0330     Chief Complaint  Patient presents with  . Chest Pain     (Consider location/radiation/quality/duration/timing/severity/associated sxs/prior Treatment) HPI History provided by patient through Spanish speaking interpreter. History of lupus, chronic pain presents tonight with cough, chest pain and pain all over, worse since yesterday. No recorded fevers. No chills. A sharp in quality moderate severity. Pain worse with coughing. No vomiting or diarrhea. No known alleviating factors. Followed by primary care physician in [facility]  Past Medical History  Diagnosis Date  . Lupus (systemic lupus erythematosus)   . Psoriasis   . Gestational diabetes   . Hypertension   . Rheumatoid arthritis(714.0)   . Chronic pain disorder   . Obesity   . Chronic steroid use   . Bronchitis    History reviewed. No pertinent past surgical history. Family History  Problem Relation Age of Onset  . Diabetes Mother   . Hypertension Mother    History  Substance Use Topics  . Smoking status: Never Smoker   . Smokeless tobacco: Never Used  . Alcohol Use: No   OB History   Grav Para Term Preterm Abortions TAB SAB Ect Mult Living                 Review of Systems  Constitutional: Negative for fever and chills.  Respiratory: Positive for cough. Negative for shortness of breath.   Cardiovascular: Positive for chest pain.  Gastrointestinal: Negative for abdominal pain.  Genitourinary: Negative for dysuria.  Musculoskeletal: Negative for back pain, neck pain and neck stiffness.  Skin: Negative for rash.  Neurological: Negative for headaches.  All other systems reviewed and are negative.      Allergies  Review of patient's allergies indicates no known allergies.  Home Medications   Current Outpatient Rx  Name  Route  Sig  Dispense  Refill  . azaTHIOprine (IMURAN) 50 MG  tablet   Oral   Take 1 tablet (50 mg total) by mouth daily.   30 tablet   0   . benzonatate (TESSALON) 100 MG capsule   Oral   Take 100 mg by mouth every 8 (eight) hours. Take for 7 days only patient has 5 pills left as of 08-26-13         . ferrous sulfate 325 (65 FE) MG tablet   Oral   Take 325 mg by mouth daily with breakfast.         . folic acid (FOLVITE) 1 MG tablet   Oral   Take 1 mg by mouth daily.         . hydroxychloroquine (PLAQUENIL) 200 MG tablet   Oral   Take 1 tablet (200 mg total) by mouth 2 (two) times daily.   60 tablet   0   . oxyCODONE-acetaminophen (PERCOCET) 10-325 MG per tablet   Oral   Take 1 tablet by mouth every 8 (eight) hours as needed for pain.   15 tablet   0   . oxyCODONE-acetaminophen (PERCOCET/ROXICET) 5-325 MG per tablet   Oral   Take 1 tablet by mouth every 6 (six) hours as needed for severe pain.   15 tablet   0   . predniSONE (DELTASONE) 5 MG tablet   Oral   Take 4 tablets (20 mg total) by mouth daily.   30 tablet   0    BP 129/83  Pulse 75  Temp(Src) 97.8 F (36.6 C) (Oral)  Resp 20  Ht 5\' 1"  (1.549 m)  Wt 295 lb 2 oz (133.868 kg)  BMI 55.79 kg/m2  SpO2 97%  LMP 09/28/2013 Physical Exam  Constitutional: She is oriented to person, place, and time. She appears well-developed and well-nourished.  HENT:  Head: Normocephalic and atraumatic.  Eyes: EOM are normal. Pupils are equal, round, and reactive to light.  Neck: Neck supple.  Cardiovascular: Normal rate, regular rhythm and intact distal pulses.   Pulmonary/Chest: Effort normal and breath sounds normal. No respiratory distress.  No point tenderness.  Abdominal: Soft. She exhibits no distension. There is no tenderness.  Obese  Musculoskeletal: Normal range of motion. She exhibits no edema and no tenderness.  Neurological: She is alert and oriented to person, place, and time. No cranial nerve deficit.  Skin: Skin is warm and dry.    ED Course  Procedures  (including critical care time) Labs Review Labs Reviewed  CBC WITH DIFFERENTIAL - Abnormal; Notable for the following:    RBC 5.24 (*)    Hemoglobin 15.3 (*)    All other components within normal limits  I-STAT CHEM 8, ED - Abnormal; Notable for the following:    Glucose, Bld 102 (*)    Hemoglobin 16.0 (*)    HCT 47.0 (*)    All other components within normal limits  URINALYSIS, ROUTINE W REFLEX MICROSCOPIC  I-STAT TROPOININ, ED  POC URINE PREG, ED   Imaging Review Dg Chest 2 View  09/29/2013   CLINICAL DATA:  Chest pain.  EXAM: CHEST  2 VIEW  COMPARISON:  08/26/2013  FINDINGS: The lungs are hypoinflated with bilateral patchy airspace opacification worse over the left upper lobe suggesting infection. Cannot exclude asymmetric edema. No evidence of effusion. Cardiomediastinal silhouette and remainder the exam is unchanged.  IMPRESSION: Patchy bilateral airspace process worse over the left upper lobe likely representing infection, although cannot exclude asymmetric edema.   Electronically Signed   By: Elberta Fortis M.D.   On: 09/29/2013 01:32   Ct Angio Chest Pe W/cm &/or Wo Cm  09/29/2013   CLINICAL DATA:  Body aches for several days. Left-sided chest pain and cough.  EXAM: CT ANGIOGRAPHY CHEST WITH CONTRAST  TECHNIQUE: Multidetector CT imaging of the chest was performed using the standard protocol during bolus administration of intravenous contrast. Multiplanar CT image reconstructions and MIPs were obtained to evaluate the vascular anatomy.  CONTRAST:  OMNIPAQUE IOHEXOL 350 MG/ML SOLN  COMPARISON:  Chest radiograph performed earlier today at 1:29 a.m., and CTA of the chest performed 06/14/2013  FINDINGS: There is no evidence of pulmonary embolus.  Mild hazy ground-glass airspace opacities are noted at the left upper lobe, with minimal bilateral lower lobe airspace opacities also seen. Findings raise concern for pneumonia. There is no evidence of pleural effusion or pneumothorax. A small  calcified granuloma is noted at the right lung apex. No masses are identified; no abnormal focal contrast enhancement is seen.  The mediastinum is unremarkable in appearance. No mediastinal lymphadenopathy is seen. No pericardial effusion is identified. The great vessels are grossly unremarkable in appearance. No axillary lymphadenopathy is seen. A 2.0 cm hypodensity is noted at the left thyroid lobe.  The visualized portions of the liver and spleen are unremarkable.  No acute osseous abnormalities are seen.  Review of the MIP images confirms the above findings.  IMPRESSION: 1. No evidence of pulmonary embolus. 2. Mild hazy ground-glass airspace opacities at the left upper  lung lobe, with additional minimal bilateral lower lobe airspace opacities. Findings are concerning for pneumonia. 3. 2.0 cm hypodensity in the left thyroid lobe. Consider further evaluation with thyroid ultrasound. If patient is clinically hyperthyroid, consider nuclear medicine thyroid uptake and scan.   Electronically Signed   By: Roanna Raider M.D.   On: 09/29/2013 05:27    Room air Pulse ox 96% is adequate  Levaquin provided. IV Dilaudid pain control  Plan discharge home with close outpatient followup. Prescription for antibiotics provided. Patient will take all medications as needed and followup with her doctor in the morning in [place]. Strict return precautions provided verbalized is understood.  MDM   Diagnosis: Community acquired pneumonia  Patient with lupus and chronic pain presents with cough and chest pain. Imaging obtained and reviewed as above. No PE. No leukocytosis. No fever. No hypoxia with room air pulse ox 96%.  Patient is appropriate and stable for discharge home. Vital signs / nursing notes reviewed and considered   Sunnie Nielsen, MD 09/29/13 [phone]

## 2013-10-31 ENCOUNTER — Encounter (HOSPITAL_COMMUNITY): Payer: Self-pay | Admitting: Emergency Medicine

## 2013-10-31 DIAGNOSIS — Z872 Personal history of diseases of the skin and subcutaneous tissue: Secondary | ICD-10-CM | POA: Insufficient documentation

## 2013-10-31 DIAGNOSIS — Z8632 Personal history of gestational diabetes: Secondary | ICD-10-CM | POA: Insufficient documentation

## 2013-10-31 DIAGNOSIS — R51 Headache: Secondary | ICD-10-CM | POA: Insufficient documentation

## 2013-10-31 DIAGNOSIS — M069 Rheumatoid arthritis, unspecified: Secondary | ICD-10-CM | POA: Insufficient documentation

## 2013-10-31 DIAGNOSIS — G8929 Other chronic pain: Secondary | ICD-10-CM | POA: Insufficient documentation

## 2013-10-31 DIAGNOSIS — I10 Essential (primary) hypertension: Secondary | ICD-10-CM | POA: Insufficient documentation

## 2013-10-31 DIAGNOSIS — Z8709 Personal history of other diseases of the respiratory system: Secondary | ICD-10-CM | POA: Insufficient documentation

## 2013-10-31 NOTE — ED Notes (Signed)
Pt states she has been having joint pain and a headache for 2 days with a fever yesterday

## 2013-11-01 ENCOUNTER — Emergency Department (HOSPITAL_COMMUNITY)
Admission: EM | Admit: 2013-11-01 | Discharge: 2013-11-01 | Disposition: A | Discharge status: Home / Self Care | Payer: Self-pay | Attending: Emergency Medicine | Admitting: Emergency Medicine

## 2013-11-01 DIAGNOSIS — M069 Rheumatoid arthritis, unspecified: Secondary | ICD-10-CM

## 2013-11-01 LAB — CBC WITH DIFFERENTIAL/PLATELET
BASOS ABS: 0 10*3/uL (ref 0.0–0.1)
Basophils Relative: 0 % (ref 0–1)
EOS ABS: 0.2 10*3/uL (ref 0.0–0.7)
Eosinophils Relative: 2 % (ref 0–5)
HCT: 44.2 % (ref 36.0–46.0)
Hemoglobin: 14.9 g/dL (ref 12.0–15.0)
LYMPHS PCT: 26 % (ref 12–46)
Lymphs Abs: 2.2 10*3/uL (ref 0.7–4.0)
MCH: 29.3 pg (ref 26.0–34.0)
MCHC: 33.7 g/dL (ref 30.0–36.0)
MCV: 86.8 fL (ref 78.0–100.0)
MONOS PCT: 3 % (ref 3–12)
Monocytes Absolute: 0.3 10*3/uL (ref 0.1–1.0)
NEUTROS PCT: 69 % (ref 43–77)
Neutro Abs: 5.8 10*3/uL (ref 1.7–7.7)
PLATELETS: 395 10*3/uL (ref 150–400)
RBC: 5.09 MIL/uL (ref 3.87–5.11)
RDW: 13.4 % (ref 11.5–15.5)
WBC: 8.5 10*3/uL (ref 4.0–10.5)

## 2013-11-01 LAB — BASIC METABOLIC PANEL
BUN: 15 mg/dL (ref 6–23)
CO2: 27 mEq/L (ref 19–32)
Calcium: 8.7 mg/dL (ref 8.4–10.5)
Chloride: 105 mEq/L (ref 96–112)
Creatinine, Ser: 0.48 mg/dL — ABNORMAL LOW (ref 0.50–1.10)
GFR calc non Af Amer: >90 mL/min (ref >90)
Glucose, Bld: 98 mg/dL (ref 70–99)
Potassium: 3.5 mEq/L — ABNORMAL LOW (ref 3.7–5.3)
Sodium: 143 mEq/L (ref 137–147)

## 2013-11-01 LAB — SEDIMENTATION RATE: SED RATE: 22 mm/h (ref 0–22)

## 2013-11-01 MED ORDER — PREDNISONE 20 MG PO TABS: ORAL_TABLET | ORAL | Status: DC

## 2013-11-01 MED ORDER — OXYCODONE-ACETAMINOPHEN 5-325 MG PO TABS
1.0000 | ORAL_TABLET | Freq: Once | ORAL | Status: AC
  Administered 2013-11-01: 1 via ORAL
  Filled 2013-11-01: qty 1

## 2013-11-01 MED ORDER — OXYCODONE-ACETAMINOPHEN 7.5-325 MG PO TABS: 1.0000 | ORAL_TABLET | ORAL | Status: DC | PRN

## 2013-11-01 MED ORDER — ONDANSETRON 4 MG PO TBDP
8.0000 mg | ORAL_TABLET | Freq: Once | ORAL | Status: AC
  Administered 2013-11-01: 8 mg via ORAL
  Filled 2013-11-01: qty 2

## 2013-11-01 MED ORDER — HYDROMORPHONE HCL PF 1 MG/ML IJ SOLN
2.0000 mg | Freq: Once | INTRAMUSCULAR | Status: AC
  Administered 2013-11-01: 2 mg via INTRAMUSCULAR
  Filled 2013-11-01: qty 2

## 2013-11-01 MED ORDER — OXYCODONE HCL 5 MG PO TABS
2.5000 mg | ORAL_TABLET | Freq: Once | ORAL | Status: AC
  Administered 2013-11-01: 2.5 mg via ORAL
  Filled 2013-11-01: qty 1

## 2013-11-01 MED ORDER — PREDNISONE 20 MG PO TABS
60.0000 mg | ORAL_TABLET | Freq: Once | ORAL | Status: AC
  Administered 2013-11-01: 60 mg via ORAL
  Filled 2013-11-01: qty 3

## 2013-11-01 MED ORDER — HYDROMORPHONE HCL PF 1 MG/ML IJ SOLN
1.0000 mg | Freq: Once | INTRAMUSCULAR | Status: DC
  Filled 2013-11-01: qty 1

## 2013-11-01 NOTE — Discharge Instructions (Signed)
Artritis Reumatoidea (Rheumatoid Arthritis) La artritis reumatoidea es una enfermedad inflamatoria crnica que causa dolor, hinchazn y rigidez de las articulaciones. Puede afectar a todo el organismo, incluidos los ojos y los pulmones. Los efectos varan en gran medida entre los que sufren esta enfermedad. CAUSAS La causa de esta enfermedad no se conoce. Tiene relacin con la herencia familiar y es ms frecuente [employer]. Ciertas clulas del sistema de defensa natural del organismo (sistema inmunolgico) no funcionan adecuadamente y comienzan a Education officer, environmental a las articulaciones sanas. En principio involucra al tejido conectivo que alinea las articulaciones (membrana sinovial) Esto puede causar una lesin en la articulacin. SNTOMAS  Dolor, rigidez, hinchazn y disminucin de la movilidad en varias articulaciones, especialmente en las manos y los pies.  La rigidez empeora por la maana. Puede durar entre 1 y 2 horas, o ms.  Adormecimiento u hormigueo de [employer].  Fatiga.  Prdida del apetito.  Prdida de peso.  Fiebre no muy elevada.  Piel y boca secas.  Bultos firmes (ndulos reumatoideos) que crecen por debajo de la piel en zonas como codos y manos. DIAGNSTICO El diagnstico se realiza en base a los sntomas descritos, el examen fsico y [place]. Algunas veces es necesario tomar radiografas. TRATAMIENTO Los PepsiCo del tratamiento son Engineer, materials, reducir la inflamacin y disminuir o Restaurant manager, fast food lesin en las articulaciones y la discapacidad. Los mtodos varan y pueden ser:  Pharmacologist un equilibrio entre reposo, [place] fsica y nutricin [place].  Medicamentos:  Calmantes (analgsicos).  Corticoides y antiinflamatorios no esteroides para reducir la inflamacin.  Medicamentos antirreumticos que modifican la enfermedad para tratar de disminuir el avance de la misma.  Modificadores de respuesta biolgica para reducir la inflamacin y las  lesiones.  Fisioterapia y terapia ocupacional.  Ciruga en aquellos pacientes con lesiones graves en las articulaciones. Puede ser necesario el reemplazo o fusin de las articulaciones.  Controles de rutina y el cuidado continuo, como visitas al [place], anlisis de [place] y [place] y Recruitment consultant. INSTRUCCIONES PARA EL CUIDADO EN EL HOGAR  Mantngase fsicamente activo y TEFL teacher la actividad cuando la enfermedad [place].  Consuma una dieta normal y bien balanceada.  Aplquese calor en las articulaciones afectadas al despertarse y antes de [employer]. Mantenga el calor sobre la zona afectada durante el tiempo que le indique su mdico.  Aplquese hielo en las articulaciones afectadas luego de las actividades o de Education administrator ejercicios.  Ponga el hielo en una bolsa plstica.  Colquese una toalla entre la piel y la bolsa de hielo.  Deje el hielo en el lugar durante 15 a 20 minutos, 3 a 4 veces por da.  Tome los United Parcel y suplementos que le indic el profesional que lo asiste.  Utilice una frula en el modo en que el profesional se lo prescribe. Esto mantiene la funcin y la posicin de las articulaciones.  No duerma con almohadas debajo de las rodillas. Esto puede causarle espasmos.  Participe en un programa de [place] para mantenerse actualizado con los ltimos tratamientos y modos de Programmer, applications enfermedad. SOLICITE ATENCIN MDICA DE INMEDIATO SI:  Sufre un desmayo.  Tiene perodos de debilidad extrema.  Una articulacin se torna rpidamente caliente y dolorida, y es ms que un dolor articular comn.  Siente escalofros.  Tiene fiebre. ASEGRESE DE QUE:   Comprende estas instrucciones.  Controlar su enfermedad.  Solicitar ayuda de inmediato si no mejora o si empeora. Irven Shelling MS INFORMACIN Cel[employer] Corporation of Rheumatology: www.rheumatology.org  Arthritis Foundation (Fundacin  para la artritis):  Document Released: 03/22/2005 Document  Revised: 12/12/2011 [facility] Patient Information 2014 [place], [place].  Prednisone tablets Qu es este medicamento? La PREDNISONA es un corticosteroide. Se utiliza para tratar la inflamacin de la piel, articulaciones, pulmones y otros rganos. Condiciones comunes tratadas incluyen asma, alergias y artritis. Tambin se [place] para Honeywell, tales como trastornos sanguneos o enfermedades de la glndula suprarrenal. Este medicamento puede ser utilizado para otros usos; si tiene alguna pregunta consulte con su proveedor de atencin mdica o con su farmacutico. MARCAS COMERCIALES DISPONIBLES: Deltasone, Predone, Sterapred DS, Sterapred Qu le debo informar a mi profesional de la salud antes de tomar este medicamento? Necesita saber si usted presenta alguno de los siguientes problemas o situaciones: -Sndrome de [place] -diabetes -glaucoma -enfermedad cardiaca -alta presin sangunea -infeccin (especialmente infecciones virales, como varicela o herpes) -enfermedad renal -enfermedad heptica -problemas mentales -miastenia gravis -osteoporosis -convulsiones -problemas estomacales o intestinales -enfermedad tiroidea -una reaccin alrgica o inusual a la lactosa, a la prednisona, a otros medicamentos, alimentos, colorantes o conservantes -si est embarazada o buscando quedar embarazada -si est amamantando a un beb Cmo debo utilizar este medicamento? Tome este medicamento por va oral con un vaso de agua. Siga las instrucciones de la etiqueta del [place]. Tome este medicamento con alimentos. Si slo toma este medicamento una vez al da, tmelo por la [place]. No tome ms medicamento que lo indicado. No deje de tomar este medicamento de manera abrupta debido a que podr Copywriter, advertising reaccin severa. Su mdico le indicar la cantidad de Agricultural consultant. Si su mdico desea que Colgate, la dosis ser reducida gradualmente para Psychiatric nurse  secundarios. Hable con su pediatra para informarse acerca del uso de este medicamento en nios. Puede requerir atencin especial. Sobredosis: Pngase en contacto inmediatamente con un centro toxicolgico o una sala de urgencia si usted cree que haya tomado demasiado medicamento. ATENCIN: Reynolds American es solo para usted. No comparta este medicamento con nadie. Qu sucede si me olvido de una dosis? Si olvida una dosis, tmela tan pronto como sea posible. Si es casi la hora de su dosis siguiente, consulte a su mdico o a su profesional de Radiographer, therapeutic. Usted puede necesitar saltar una dosis o tomar una dosis adicional. No tome dosis dobles o adicionales sin asesoramiento. Qu puede interactuar con este medicamento? No tome esta medicina con ninguno de los siguientes medicamentos: -metirapona -mifepristona Esta medicina tambin puede interactuar con los siguientes medicamentos: -aminoglutetimida -anfotericina B -aspirina o medicamentos tipo aspirina -barbitricos -ciertos medicamentos para la diabetes, tales como glipizida o gliburida -colestiramina -inhibidores de colinesterasa -ciclosporina -digoxina -diurticos -efedrina -hormonas femeninas, como estrgenos, progestinas o pldoras anticonceptivas -isoniazida -quetoconazol -los [place], medicamentos para el dolor o inflamacin, como ibuprofeno o naproxeno -fenitona -rifampicina -toxoide -vacunas -warfarina Puede ser que esta lista no menciona todas las posibles interacciones. Informe a su profesional de Beazer Homes de Ingram Micro Inc productos a base de hierbas, medicamentos de [place] o suplementos nutritivos que est tomando. Si usted fuma, consume bebidas alcohlicas o si utiliza drogas ilegales, indqueselo tambin a su profesional de [employer] Homes. Algunas sustancias pueden interactuar con su medicamento. A qu debo estar atento al usar PPL Corporation? Visite a su mdico o a su profesional de la salud para chequear su evolucin  peridicamente. Si toma este medicamento durante un perodo prolongado, lleve consigo una tarjeta de identificacin con su nombre y direccin, el tipo y la dosis del [place], y Administrator, Civil Service y la direccin de su mdico. [place]  medicamento puede aumentar su riesgo de contraer una infeccin. Informe a su mdico o a su profesional de la salud si est en contacto con personas con sarampin o varicela, o si desarrolla llagas o ampollas que no se curan bien. Si va a someterse a una operacin, informe a su mdico o a su profesional de la salud que ha tomado este Mellon Financial ltimos doce meses. Consulte a su mdico o a su profesional de la salud acerca de su dieta. Tal vez tendr que reducir la cantidad de sal que consume. Este medicamento puede afectar su nivel de Banker. Si tiene diabetes, consulte a su mdico o a su profesional de la salud antes de cambiar su dieta o la dosis de su medicamento para la diabetes. Qu efectos secundarios puedo tener al Boston Scientific este medicamento? Efectos secundarios que debe informar a su mdico o a Producer, television/film/video de la salud tan pronto como sea posible: -Therapist, art como erupcin cutnea, picazn o urticarias, hinchazn de la cara, labios o lengua -cambios de emociones o humor -cambios en la visin -humor deprimido -dolor ocular -fiebre o escalofros, tos, dolor de garganta, dolor o dificultad para orinar -aumento de sed -hinchazn de tobillos, pies Efectos secundarios que, por lo general, no requieren atencin mdica (debe informarlos a su mdico o a su profesional de la salud si persisten o si son molestos): -confusin, excitacin, inquietud -dolor de cabeza -nuseas, vmito -problemas en la piel, acn, piel delgada y brillante -dificultad para conciliar el sueo -aumento de peso Puede ser que esta lista no menciona todos los posibles efectos secundarios. Comunquese a su mdico por asesoramiento mdico Hewlett-Packard. Usted  puede informar los efectos secundarios a la FDA por telfono al 1-800-FDA-1088. Dnde debo guardar mi medicina? Mantngala fuera del alcance de los nios. Gurdela a Sanmina-SCI, entre 15 y 30 grados C (68 y 103 grados F). Protejla de la luz. Mantenga el envase bien cerrado. Deseche los medicamentos que no haya utilizado, despus de la fecha de vencimiento. ATENCIN: Este folleto es un resumen. Puede ser que no cubra toda la posible informacin. Si usted tiene preguntas acerca de esta medicina, consulte con su mdico, su farmacutico o su profesional de Radiographer, therapeutic.  2014, Elsevier/Gold Standard. (2011-03-01 16:57:34)  Acetaminophen; Oxycodone tablets Qu es este medicamento? El compuesto ACETAMINOFENO; OXICODONA es un analgsico. Se utiliza para tratar los dolores leves a moderados. Este medicamento puede ser utilizado para otros usos; si tiene alguna pregunta consulte con su proveedor de atencin mdica o con su farmacutico. MARCAS COMERCIALES DISPONIBLES: Endocet, Magnacet, Narvox, Percocet, Perloxx, Primalev, Primlev, Roxicet, Xolox [employer]ley [employer] debo informar a mi profesional de la salud antes de tomar este medicamento? Necesita saber si usted presenta alguno de los [employer] [facility] o situaciones: -tumor cerebral -enfermedad de Crohn, enfermedad intestinal inflamatoria o colitis ulcerativa -abuso de drogas o drogadiccin -lesin de la cabeza -problemas cardiacos o circulatorios -si consume alcohol con frecuencia -enfermedad renal o problemas al orinar -enfermedad heptica -enfermedad pulmonar, asma o dificultades al respirar -una reaccin alrgica o inusual al acetaminofeno, a la oxicodona, a otros analgsicos opiceos, a otros medicamentos, alimentos, colorantes o conservantes -si est embarazada o buscando quedar embarazada -si est amamantando a un beb Cmo debo utilizar este medicamento? Tome este medicamento por va oral con un vaso lleno de agua. Siga las instrucciones  de la etiqueta del [place]. Tome sus dosis a intervalos regulares. No tome su medicamento con una frecuencia mayor que la indicada. Hable con su  pediatra para informarse acerca del uso de este medicamento en nios. Puede requerir Customer service manageratencin especial. Los pacientes de ms de 65 aos de edad pueden presentar reacciones ms fuertes a Industrial/product designereste medicamento y Pension scheme managernecesitar dosis menores. Sobredosis: Pngase en contacto inmediatamente con un centro toxicolgico o una sala de urgencia si usted cree que haya tomado demasiado medicamento. ATENCIN: Reynolds AmericanEste medicamento es solo para usted. No comparta este medicamento con nadie. Qu sucede si me olvido de una dosis? Si olvida una dosis, tmela lo antes posible. Si es casi la hora de la prxima dosis, tome slo esa dosis. No tome dosis adicionales o dobles. Qu puede interactuar con este medicamento? -alcohol  -antihistamnicos -barbitricos tales como el amobarbital, butalbital, butabarbital, metohexital, pentobarbital, fenobarbital, tiopental y secobarbital -benztropina -medicamentos para problemas de vejiga, tales como solifenacina, trospium, oxibutinina, tolterodina, hiosciamina y metscopolamina -medicamentos para problemas respiratorios, tales como ipratropio y tiotropio -medicamentos para ciertos problemas estomacales o intestinales, tales como propantelina, homatropina metilbromuro, Dispensing opticianglucopirrolato, atropina, belladona y diciclomina -anestsicos generales, tales como etomidato, Portalesketamina, xido nitroso, propofol, desflurano, enflurano, halotano, isoflurano y sevoflurano -medicamentos para la depresin, ansiedad o trastornos psicticos -medicamentos para dormir -relajantes musculares -naltrexona -medicamentos narcticos (opiceos) para Chief Technology Officerel dolor -fenotiazinas, tales como perfenacina, tioridazina, clorpromacina, mesoridazina, flufenazina, proclorperazina, promazina y trifluoperazina -escopolamina -tramadol -trihexifenidilo Puede ser que esta lista no menciona  todas las posibles interacciones. Informe a su profesional de Beazer Homesla salud de Ingram Micro Inctodos los productos a base de hierbas, medicamentos de [place]venta libre o suplementos nutritivos que est tomando. Si usted fuma, consume bebidas alcohlicas o si utiliza drogas ilegales, indqueselo tambin a su profesional de Beazer Homesla salud. Algunas sustancias pueden interactuar con su medicamento. A qu debo estar atento al usar PPL Corporationeste medicamento? Si el dolor no desaparece, si empeora o si experimenta un dolor nuevo o de tipo diferente, consulte a su mdico o a su profesional de Beazer Homesla salud. Usted puede desarrollar tolerancia al medicamento. La tolerancia significa que necesitar una dosis ms alta para Engineer, materialsaliviar el dolor. Tolerancia es normal y esperada cuando est tomando este medicamento por un largo perodo de [place]. No suspenda el uso de su medicamento repentinamente debido a que puede Copywriter, advertisingdesarrollar una reaccin severa. Su cuerpo se acostumbra a Industrial/product designereste medicamento. Esto NO significa que sea adicto. La adiccin es un comportamiento que hace referencia a la obtencin y utilizacin de un medicamento con fines que no son mdicos. Si tiene Engineer, miningdolor, existe una razn mdica para que usted tome un analgsico. Su mdico le indicar la cantidad de medicamento que Mudloggernecesitar tomar. Si su mdico desea que Colgatepare el tratamiento, la dosis ser reducida gradualmente para Psychiatric nurseevitar efectos secundarios. Puede experimentar somnolencia o mareos. No conduzca ni utilice maquinaria ni haga nada que Scientist, research (life sciences)le exija permanecer en estado de alerta hasta que sepa cmo le afecta este medicamento. No se siente ni se ponga de pie con rapidez, especialmente si es un paciente de edad avanzada. Esto reduce el riesgo de mareos o Newell Rubbermaiddesmayos. El alcohol puede interferir con el efecto de [place]este medicamento. Evite consumir bebidas alcohlicas. Hay distintos tipos de medicamentos narcticos (opiceos) para Chief Technology Officerel dolor. Si usted toma ms que un tipo a la Performance Food Groupmisma vez, podr tener ms Lexmark Internationalefectos secundarios. Dar a  su proveedor de atencin medica una lista de todos los medicamentos que usted [place]. Su mdico le informar la cantidad de medicamento que Loss adjuster, charterednecesita tomar. No tome ms medicamento que lo indicado. Comunquese con emergencia para ayuda si tiene problemas para respirar. Este medicamento causar estreimiento. Trate de evacuar los intestinos al menos cada 2  3 das. Si no evacua los intestinos durante 3 [address]  [address], comunquese con su mdico o con su profesional de [employer]. No tome Tylenol (acetaminofeno) u otros medicamentos que contienen acetaminofeno con este medicamento. Tomando mucho acetaminofeno puede ser muy peligroso. Muchos medicamentos de venta libre contienen acetaminofeno. Lea siempre las etiquetas cuidadosamente para evitar el tomar ms acetaminofeno. Qu efectos secundarios puedo tener al Boston Scientific este medicamento? Efectos secundarios que debe informar a su mdico o a Producer, television/film/video de la salud tan pronto como sea posible: -Scientist, physiological, tales como erupcin cutnea, picazn o urticarias, hinchazn de la cara, labios o lengua -dificultades respiratorias, sibilancias -confusin -sensacin de desmayos o aturdimiento -dolor de estmago severo -cansancio o debilidad inusual -color amarillento de los ojos o la piel Efectos secundarios que, por lo general, no requieren atencin mdica (debe informarlos a su mdico o a su profesional de la salud si persisten o si son molestos): -mareos -somnolencia -nuseas -vmitos Puede ser que esta lista no menciona todos los posibles efectos secundarios. Comunquese a su mdico por asesoramiento mdico Hewlett-Packard. Usted puede informar los efectos secundarios a la FDA por telfono al 1-800-FDA-1088. Dnde debo guardar mi medicina? Mantngala fuera del alcance de los nios. Este medicamento puede ser abusado. Mantenga su medicamento en un lugar seguro para protegerlo contra robos. No comparta este medicamento con nadie. Es peligroso  vender o ceder este medicamento y est prohibido por la ley. Gurdelo a Sanmina-SCI, entre 20 y 25 grados C (29 y 38 grados F). Mantenga el envase bien cerrado. Protjalo de Statistician.  Este medicamento puede causar muerte y sobredosis accidental si es tomado por otros adultos, nios o Neurosurgeon. Tire los medicamentos que no haya utilizado al inodoro para reducir la posibilidad de dao. No use el medicamento despus de la fecha de vencimiento. ATENCIN: Este folleto es un resumen. Puede ser que no cubra toda la posible informacin. Si usted tiene preguntas acerca de esta medicina, consulte con su mdico, su farmacutico o su profesional de Radiographer, therapeutic.  2014, Elsevier/Gold Standard. (2013-02-24 19:44:45)   Emergency Department Resource Guide 1) Find a Doctor and Pay Out of Pocket Although you won't have to find out who is covered by your insurance plan, it is a good idea to ask around and get recommendations. You will then need to call the office and see if the doctor you have chosen will accept you as a new patient and what types of options they offer for patients who are self-pay. Some doctors offer discounts or will set up payment plans for their patients who do not have insurance, but you will need to ask so you aren't surprised when you get to your appointment.  2) Contact Your Local Health Department Not all health departments have doctors that can see patients for sick visits, but many do, so it is worth a call to see if yours does. If you don't know where your local health department is, you can check in your phone book. The CDC also has a tool to help you locate your state's health department, and many state websites also have listings of all of their local health departments.  3) Find a Walk-in Clinic If your illness is not likely to be very severe or complicated, you may want to try a walk in clinic. These are popping up all over the country in pharmacies, drugstores, and shopping  centers. They're usually staffed by nurse practitioners or physician assistants that have been trained to treat common illnesses  and complaints. They're usually fairly quick and inexpensive. However, if you have serious medical issues or chronic medical problems, these are probably not your best option.  No Primary Care Doctor: - Call [facility] Connect at  [phone] - they can help you locate a primary care doctor that  accepts your insurance, provides certain services, etc. - Physician Referral Service- [phone]  Chronic Pain Problems: Organization         Address  Phone   Notes  [facility]  [phone] Patients need to be referred by their primary care doctor.   Medication Assistance: Organization         Address  Phone   Notes  [facility] Medication [facility] [address] [place] [place], [place] [address] [phone] --Must be a resident of [facility] -- Must have NO insurance coverage whatsoever (no Medicaid/ Medicare, etc.) -- The pt. MUST have a primary care doctor that directs their care regularly and follows them in the community   [facility]  [phone]   [employer]  [phone]    Agencies that provide inexpensive medical care: Organization         Address  Phone   Notes  [facility]  [phone]   [facility]    [phone]   [facility] [address] [place], [place] [address] [phone]   [facility] [address] [place] [place] [phone]   Planned Parenthood    [phone]   [facility]    [phone]   [facility]  [address], [place] Phone:  [phone], Fax:  [phone] Hours of Operation:  9 am - 6 pm, M-F.  Also accepts Medicaid/Medicare and self-pay.  [facility]  [address], [place] Phone: [phone], Fax: [phone]. Hours of Operation:  8:30 am - 5:30 pm, M-F.  Also accepts Medicaid and self-pay.  [facility] [place] [address] [place] Phone: [phone]   [facility] [address] Natasha Bence [place], [place] [phone], Ext. [phone] Mondays & Thursdays: 7-9 AM.  First 15 patients are seen on a first come, first serve basis.    Medicaid-accepting [facility] Providers:  Organization         Address  Phone   Notes  [facility] [address], [place] [phone] Also accepts self-pay patients.  [facility] [address] Laurell Josephs [place], [place]  [phone]   [facility] [address], [place] [phone]   [facility] [address], [place] [phone]   Renaye Rakers [address], [place]   [phone] Only accepts [place] Access [place] patients after they have their name applied to their card.   Self-Pay (no insurance) in [facility]:  Organization         Address  Phone   Notes  Sickle Cell Patients, [facility] [address] [place], [place] [phone]   [facility] [address] [place], [place] [phone]   [facility] [place]  [address], [place] [phone]   [facility]/Dr. Osei-Bonsu  [address] [place] or [address], [place] [phone] Phone number for both [facility] and [place] locations is the same.  [facility] [address], [place] [phone]   [facility] [address] [place] or [address] [phone]   [facility] [address], [place] [phone], phone; [phone], fax Sees patients 1st and 3rd Saturday of every month.  Must not qualify for public or private insurance (i.e.  Medicaid, Medicare, [place] Health Choice, Veterans' Benefits)  Household income should be no more than 200% of the poverty level The clinic cannot treat you if you are pregnant or think you are pregnant  Sexually transmitted diseases are not treated at the clinic.    Dental Care: Organization         Address  Phone  Notes  [facility] Department of [facility] [address] [place], [place] [phone] Accepts children up to age 66 who are enrolled in [place] or [place] Health Choice; pregnant women with a Medicaid card; and children who have applied for Medicaid or [place] Health Choice, but were declined, whose parents can pay a reduced fee at time of service.  [facility] Department of [facility]  [address], [place] [phone] Accepts children up to age 53 who are enrolled in [place] or [place] Health Choice; pregnant women with a Medicaid card; and children who have applied for Medicaid or [place] Health Choice, but were declined, whose parents can pay a reduced fee at time of service.  [facility]  [address] [place], [place] [phone] Patients are seen by appointment only. Walk-ins are not accepted. [facility] will see patients 57 years of age and older. Monday - Tuesday (8am-5pm) Most Wednesdays (8:30-5pm) $30 per visit, cash only  [facility]  [address], [facility] [phone] Patients are seen by appointment only. Walk-ins are not accepted. [facility] will see patients 19 years of age and older. One Wednesday Evening (Monthly: Volunteer Based).  $30 per visit, cash only  [employer]  [phone] for adults; Children under age 80, call Graduate Pediatric Dentistry at [phone]. Children aged 75-14, please call [phone] to request a pediatric application.  Dental services are provided in all areas of dental care including fillings,  crowns and bridges, complete and partial dentures, implants, gum treatment, root canals, and extractions. Preventive care is also provided. Treatment is provided to both adults and children. Patients are selected via a lottery and there is often a waiting list.   [facility] [address] [place]  [phone] [email or web address]   [facility] [address] [place], [place] [phone], Ext. [phone] Second and Fourth Thursday of each month, opens at 6:30 AM; Clinic ends at 9 AM.  Patients are seen on a first-come first-served basis, and a limited number are seen during each clinic.   [facility]  [address] [place]ins [place], [place] [phone]   Eligibility Requirements You must have lived in [place], [place], or [place] counties for at least the last three months.   You cannot be eligible for state or federal sponsored [place], including [employer], [place], or [employer].   You generally cannot be eligible for healthcare insurance through your employer.    How to apply: Eligibility screenings are held every Tuesday and Wednesday afternoon  from 1:00 pm until 4:00 pm. You do not need an appointment for the interview!  [facility] [address] [place], [place] [phone]   [facility] Health Department  [phone]   [facility] Health Department  [phone]   [facility] Health Department  [phone]    Behavioral Health Resources in the Community: Intensive Outpatient Programs Organization         Address  Phone  Notes  [facility] [address], [place], [place] [phone]   [facility] Out[facility] [address] [place], [place] [phone]   [facility] [address] [place], [place]  [phone]   [facility] [address]  [place], [place] [phone] or [phone]   Substance Abuse  Resources Organization         Address  Phone  Notes  [facility]  [phone]   [facility]  [phone]   [facility] [place]  [phone]   Floydene Flock  [phone]   Residential & Outpatient Substance Abuse Program  [phone]   Psychological Services Organization         Address  Phone  Notes  [facility]al Health  [phone]   [facility]  [phone]   [facility] Mental Health [address], [place] [phone] or [phone]    Mobile Crisis Teams Organization         Address  Phone  Notes  [facility], Mobile Crisis Care Unit  [phone]   [facility]  [address] [place], [place] [phone]   Doristine Locks [address] [place] [phone]    Self-Help/Support Groups Organization         Address  Phone             Notes  [facility] [place] - variety of support groups  [phone]- I7437963 Call for more information  Narcotics Anonymous (NA), [facility] [address], [employer] [place]  2 meetings at this location   Statistician         Address  Phone  Notes  [facility] [address],    [place]  [phone]   [facility]  [address] [place] [address], [place], [place] [phone]   [facility] [address] [place], [place] [phone] Admissions: 8am-3pm M-F  [facility] [address],    [place], [place] [phone]   [facility] [address] [place], [place], [place] [phone]   [facility] [address]  [place], [place] [phone]   [facility] - Intensive Outpatient [address], [place], [place] [phone]   [facility] ([facility] [address] [place]  [place], [place] [phone] or [phone]   [facility] ([facility]) [address] [place], [place] [phone] Accepts Medicaid  [facility] [address],  [place] [phone] Substance Abuse/Addiction Treatment   [facility] Organization         Address  Phone  Notes  [facility]  [phone]   Angie Fava, PhD [address] [place], [place]   [phone] or [phone]   [facility]   [address] [place], [place] [phone]   [facility] [address], [place], [place] [phone] Insurance/Medicaid/sponsorship through [employer] and Families  [place], [place] [phone] [place], [place] [phone]    Dr. Adele Schilder  [phone]   [facility] Dept. 1) [address], [place] 2) [place] 3)  [place] [address], [place] [phone]  [phone]   [place] [phone] or [phone] (After Hours)

## 2013-11-01 NOTE — ED Notes (Signed)
Patient continues to C/O severe pain in her joints.  Pain exacerbated by movement.

## 2013-11-01 NOTE — ED Provider Notes (Signed)
CSN: [id number]     Arrival date & time 10/31/13  2313 History   First MD Initiated Contact with Patient 11/01/13 0444     Chief Complaint  Patient presents with  . Joint Pain  . Headache     (Consider location/radiation/quality/duration/timing/severity/associated sxs/prior Treatment) Patient is a 28 y.o. female presenting with headaches. The history is provided by the patient.  Headache She has history of systemic lupus erythematosus and rheumatoid arthritis. She complains of generalized body aching for the last 3 days. Pain is severe and she rates at 10/10. There has been subjective fever and chills. She's also had nausea and vomiting. She has not taken anything for pain. Pain is worse with movement and with palpation. Nothing makes it better.  Past Medical History  Diagnosis Date  . Lupus (systemic lupus erythematosus)   . Psoriasis   . Gestational diabetes   . Hypertension   . Rheumatoid arthritis(714.0)   . Chronic pain disorder   . Obesity   . Chronic steroid use   . Bronchitis    History reviewed. No pertinent past surgical history. Family History  Problem Relation Age of Onset  . Diabetes Mother   . Hypertension Mother    History  Substance Use Topics  . Smoking status: Never Smoker   . Smokeless tobacco: Never Used  . Alcohol Use: No   OB History   Grav Para Term Preterm Abortions TAB SAB Ect Mult Living                 Review of Systems  Neurological: Positive for headaches.  All other systems reviewed and are negative.     Allergies  Review of patient's allergies indicates no known allergies.  Home Medications   Prior to Admission medications   Not on File   BP 150/111  Pulse 97  Temp(Src) 98.7 F (37.1 C) (Oral)  Resp 20  SpO2 95% Physical Exam  Nursing note and vitals reviewed.  Morbidly obese 34fe year old female, resting comfortably and in no acute distress. Vital signs are significant for hypertension with blood pressure 150/111.  Oxygen saturation is 95%, which is normal. Head is normocephalic and atraumatic. PERRLA, EOMI. Oropharynx is clear. Neck is nontender and supple without adenopathy or JVD. Back is nontender and there is no CVA tenderness. Lungs are clear without rales, wheezes, or rhonchi. Chest is nontender. Heart has regular rate and rhythm without murmur. Abdomen is soft, flat, nontender without masses or hepatosplenomegaly and peristalsis is normoactive. Extremities have no cyanosis or edema. She is tender to palpation in virtually all extremities and joints and there is pain with passive movement in virtually all joints. Skin is warm and dry without rash. Neurologic: Mental status is normal, cranial nerves are intact, there are no motor or sensory deficits.  ED Course  Procedures (including critical care time) Labs Review Results for orders placed during the hospital encounter of 11/01/13  CBC WITH DIFFERENTIAL      Result Value Ref Range   WBC 8.5  4.0 - 10.5 K/uL   RBC 5.09  3.87 - 5.11 MIL/uL   Hemoglobin 14.9  12.0 - 15.0 g/dL   HCT 38.7  56.4 - 33.2 %   MCV 86.8  78.0 - 100.0 fL   MCH 29.3  26.0 - 34.0 pg   MCHC 33.7  30.0 - 36.0 g/dL   RDW 95.1  88.4 - 16.6 %   Platelets 395  150 - 400 K/uL   Neutrophils Relative % 69  43 - 77 %   Lymphocytes Relative 26  12 - 46 %   Monocytes Relative 3  3 - 12 %   Eosinophils Relative 2  0 - 5 %   Basophils Relative 0  0 - 1 %   Neutro Abs 5.8  1.7 - 7.7 K/uL   Lymphs Abs 2.2  0.7 - 4.0 K/uL   Monocytes Absolute 0.3  0.1 - 1.0 K/uL   Eosinophils Absolute 0.2  0.0 - 0.7 K/uL   Basophils Absolute 0.0  0.0 - 0.1 K/uL   WBC Morphology ATYPICAL LYMPHOCYTES    BASIC METABOLIC PANEL      Result Value Ref Range   Sodium 143  137 - 147 mEq/L   Potassium 3.5 (*) 3.7 - 5.3 mEq/L   Chloride 105  96 - 112 mEq/L   CO2 27  19 - 32 mEq/L   Glucose, Bld 98  70 - 99 mg/dL   BUN 15  6 - 23 mg/dL   Creatinine, Ser 1.21 (*) 0.50 - 1.10 mg/dL   Calcium 8.7  8.4  - 97.5 mg/dL   GFR calc non Af Amer >90  >90 mL/min   GFR calc Af Amer >90  >90 mL/min   MDM   Final diagnoses:  Rheumatoid arthritis    Generalized pain which probably represents an exacerbation of her rheumatoid arthritis or lupus. Old records are reviewed and she has prior ED visits with similar presentations. Screening labs are obtained and she will be given a dose of oxycodone and acetaminophen for pain. She probably will need a short course of steroids.  She was given initial dose of oxycodone acetaminophen but required additional dose. CBC and metabolic panel are essentially unremarkable. She's given a dose of prednisone and will be discharged with a prescription for oxycodone-acetaminophen. She states she has no PCP, so she will be given a copy of the resource guide.  Dione Booze, MD 11/01/13 0830

## 2013-11-01 NOTE — ED Notes (Signed)
Pt states she has pain all over body that has lasted 3 days. Pt states she has had chills. Pt c/o pain 10/10 allover in her joints. Pt states she was nausea on last evening but denies vomitting;

## 2013-11-01 NOTE — ED Notes (Signed)
Pt presents with pain but unable to communicate much more information due to language barrier no family present; pt will need translator phone; pt speaks spanish

## 2013-11-01 NOTE — ED Notes (Signed)
MD at bedside. 

## 2013-11-06 DIAGNOSIS — I2699 Other pulmonary embolism without acute cor pulmonale: Secondary | ICD-10-CM | POA: Insufficient documentation

## 2014-02-12 ENCOUNTER — Encounter (HOSPITAL_COMMUNITY): Payer: Self-pay | Admitting: Emergency Medicine

## 2014-02-12 ENCOUNTER — Emergency Department (HOSPITAL_COMMUNITY)
Admission: EM | Admit: 2014-02-12 | Discharge: 2014-02-13 | Disposition: A | Discharge status: Home / Self Care | Payer: Self-pay | Attending: Emergency Medicine | Admitting: Emergency Medicine

## 2014-02-12 ENCOUNTER — Emergency Department (HOSPITAL_COMMUNITY): Payer: Self-pay

## 2014-02-12 DIAGNOSIS — Z791 Long term (current) use of non-steroidal anti-inflammatories (NSAID): Secondary | ICD-10-CM | POA: Insufficient documentation

## 2014-02-12 DIAGNOSIS — Z872 Personal history of diseases of the skin and subcutaneous tissue: Secondary | ICD-10-CM | POA: Insufficient documentation

## 2014-02-12 DIAGNOSIS — Z8632 Personal history of gestational diabetes: Secondary | ICD-10-CM | POA: Insufficient documentation

## 2014-02-12 DIAGNOSIS — Z8739 Personal history of other diseases of the musculoskeletal system and connective tissue: Secondary | ICD-10-CM | POA: Insufficient documentation

## 2014-02-12 DIAGNOSIS — Z79899 Other long term (current) drug therapy: Secondary | ICD-10-CM | POA: Insufficient documentation

## 2014-02-12 DIAGNOSIS — J189 Pneumonia, unspecified organism: Secondary | ICD-10-CM

## 2014-02-12 DIAGNOSIS — M069 Rheumatoid arthritis, unspecified: Secondary | ICD-10-CM | POA: Insufficient documentation

## 2014-02-12 DIAGNOSIS — J159 Unspecified bacterial pneumonia: Secondary | ICD-10-CM | POA: Insufficient documentation

## 2014-02-12 DIAGNOSIS — E669 Obesity, unspecified: Secondary | ICD-10-CM | POA: Insufficient documentation

## 2014-02-12 DIAGNOSIS — R5383 Other fatigue: Secondary | ICD-10-CM

## 2014-02-12 DIAGNOSIS — I10 Essential (primary) hypertension: Secondary | ICD-10-CM | POA: Insufficient documentation

## 2014-02-12 DIAGNOSIS — R079 Chest pain, unspecified: Secondary | ICD-10-CM | POA: Insufficient documentation

## 2014-02-12 DIAGNOSIS — G8929 Other chronic pain: Secondary | ICD-10-CM | POA: Insufficient documentation

## 2014-02-12 DIAGNOSIS — R5381 Other malaise: Secondary | ICD-10-CM | POA: Insufficient documentation

## 2014-02-12 DIAGNOSIS — Z3202 Encounter for pregnancy test, result negative: Secondary | ICD-10-CM | POA: Insufficient documentation

## 2014-02-12 LAB — CBC
HCT: 42.1 % (ref 36.0–46.0)
Hemoglobin: 13.2 g/dL (ref 12.0–15.0)
MCH: 27.5 pg (ref 26.0–34.0)
MCHC: 31.4 g/dL (ref 30.0–36.0)
MCV: 87.7 fL (ref 78.0–100.0)
Platelets: 295 10*3/uL (ref 150–400)
RBC: 4.8 MIL/uL (ref 3.87–5.11)
RDW: 13.8 % (ref 11.5–15.5)
WBC: 7.1 10*3/uL (ref 4.0–10.5)

## 2014-02-12 LAB — RAPID STREP SCREEN (MED CTR MEBANE ONLY): Streptococcus, Group A Screen (Direct): NEGATIVE

## 2014-02-12 LAB — POC URINE PREG, ED: PREG TEST UR: NEGATIVE

## 2014-02-12 LAB — BASIC METABOLIC PANEL
ANION GAP: 11 (ref 5–15)
BUN: 18 mg/dL (ref 6–23)
CO2: 24 meq/L (ref 19–32)
Calcium: 8.7 mg/dL (ref 8.4–10.5)
Chloride: 108 mEq/L (ref 96–112)
Creatinine, Ser: 0.6 mg/dL (ref 0.50–1.10)
GFR calc Af Amer: >90 mL/min (ref >90)
GFR calc non Af Amer: >90 mL/min (ref >90)
Glucose, Bld: 111 mg/dL — ABNORMAL HIGH (ref 70–99)
POTASSIUM: 4 meq/L (ref 3.7–5.3)
SODIUM: 143 meq/L (ref 137–147)

## 2014-02-12 MED ORDER — OXYCODONE-ACETAMINOPHEN 5-325 MG PO TABS
1.0000 | ORAL_TABLET | Freq: Once | ORAL | Status: AC
  Administered 2014-02-13: 1 via ORAL
  Filled 2014-02-12: qty 1

## 2014-02-12 NOTE — ED Notes (Signed)
Pt reports since yesterday body aches, fever, headache, cough and chest pain, with sore throat. Denies n/v/d. Also has itching to her right hand. Has hx of lupus. Pt is a x 4. Using translator lines to obtain triage.

## 2014-02-12 NOTE — ED Notes (Signed)
Attempted to draw pts labs was unsuccessful called phlebotomy and spoke with [employer]

## 2014-02-12 NOTE — ED Notes (Signed)
Patient to ED with multiple complaints.  States that her head hurts all over.  She also C/O having a sore throat, Chest and abdominal pain.  Chest pain is in her left chest and is tender to palpation.  She also C/O upper abdominal pain.  RN notes that her right and left upper abdomen is tender to palpation.  Denies nausea, vomiting, diarrhea.

## 2014-02-13 LAB — PRO B NATRIURETIC PEPTIDE: PRO B NATRI PEPTIDE: 25.4 pg/mL (ref 0–125)

## 2014-02-13 LAB — URINALYSIS, ROUTINE W REFLEX MICROSCOPIC
Bilirubin Urine: NEGATIVE
Glucose, UA: NEGATIVE mg/dL
Hgb urine dipstick: NEGATIVE
KETONES UR: NEGATIVE mg/dL
Leukocytes, UA: NEGATIVE
NITRITE: NEGATIVE
Protein, ur: NEGATIVE mg/dL
SPECIFIC GRAVITY, URINE: 1.025 (ref 1.005–1.030)
UROBILINOGEN UA: 1 mg/dL (ref 0.0–1.0)
pH: 6 (ref 5.0–8.0)

## 2014-02-13 LAB — I-STAT TROPONIN, ED: Troponin i, poc: 0 ng/mL (ref 0.00–0.08)

## 2014-02-13 MED ORDER — LEVOFLOXACIN 750 MG PO TABS: 750.0000 mg | ORAL_TABLET | Freq: Every day | ORAL | Status: DC

## 2014-02-13 MED ORDER — LEVOFLOXACIN 750 MG PO TABS
750.0000 mg | ORAL_TABLET | Freq: Once | ORAL | Status: AC
  Administered 2014-02-13: 750 mg via ORAL
  Filled 2014-02-13: qty 1

## 2014-02-13 MED ORDER — HYDROCODONE-ACETAMINOPHEN 5-325 MG PO TABS
1.0000 | ORAL_TABLET | Freq: Once | ORAL | Status: AC
  Administered 2014-02-13: 1 via ORAL
  Filled 2014-02-13: qty 1

## 2014-02-13 MED ORDER — HYDROCODONE-ACETAMINOPHEN 5-325 MG PO TABS: 1.0000 | ORAL_TABLET | Freq: Four times a day (QID) | ORAL | Status: DC | PRN

## 2014-02-13 NOTE — ED Provider Notes (Signed)
  Face-to-face evaluation   History: Patient complains of head to toe achiness   Physical exam: Obese, alert, cooperative. Mouth moist no lesions. Heart regular rate and no murmurs. Lungs clear anteriorly. Abdomen mild epigastric tenderness.  Medical screening examination/treatment/procedure(s) were conducted as a shared visit with non-physician practitioner(s) and myself.  I personally evaluated the patient during the encounter  Flint Melter, MD 02/16/14 1550

## 2014-02-13 NOTE — Discharge Instructions (Signed)
levaquin for pneumonia until all gone. norco for pain. Follow up with primary care doctor. Return if worsening.    Neumona en el adulto (Pneumonia, Adult) La neumona es una infeccin en los pulmones. Puede ser causado por un germen (virus o bacteria). Algunos tipos de neumona pueden contagiarse fcilmente a Economist. Esto puede ocurrir al toser o Engineering geologist. CUIDADOS EN EL HOGAR  Tome slo la medicacin que le indic el profesional que lo asiste.  Tome los medicamentos (antibiticos) tal como se le indic. Finalice la prescripcin completa, aunque se sienta mejor.  No fume.  Puede utilizar un humidificador o vaporizador en la habitacin. Esto puede ayudar a Film/video editor.  Para reducir la tos, duerma de forma tal que usted estn en posicin casi sentada (semi-vertical).  Descanse. Existe una vacuna que puede prevenir neumona. Se recomiendan inyecciones en caso de:  Personas de 65 aos o ms.  Pacientes de quimioterapia.  Personas con problemas pulmonares a largo plazo (crnicos).  Personas con problemas del sistema inmunolgico. SOLICITE AYUDA DE INMEDIATO SI:  Siente que empeora.  No puede controlar la tos, y no puede dormir.  Usted escupen sangre al toser.  El dolor [place], incluso con los medicamentos.  Tiene fiebre.  Los problemas empeoran en vez de Scientist, clinical (histocompatibility and immunogenetics).  Usted sienten falta de aire o dolor en el pecho. ASEGRESE QUE:  Comprende estas instrucciones.  Controlar su enfermedad.  Solicitar ayuda inmediatamente si no mejora o si empeora. Document Released: 11/30/2009 Document Revised: 09/04/2011 [facility] Patient Information 2015 [place], [place]. This information is not intended to replace advice given to you by your health care provider. Make sure you discuss any questions you have with your health care provider.

## 2014-02-13 NOTE — ED Provider Notes (Signed)
CSN: [id number]     Arrival date & time 02/12/14  1823 History   First MD Initiated Contact with Patient 02/12/14 2231     Chief Complaint  Patient presents with  . Generalized Body Aches  . Headache  . Chest Pain     (Consider location/radiation/quality/duration/timing/severity/associated sxs/prior Treatment) HPI Donna Hall is a 28 y.o. female with history of lupus, hypertension, rheumatoid arthritis, PE, chronic pain disorder, Hispanic speaking only, presents emergency department complaining of generalized pain, sore throat, weakness, chest pain or shortness of breath. Patient states her symptoms began yesterday. She states she felt feverish yesterday, denies any chills. She denies any cough. No nasal congestion. Denies any swelling in her legs or recent travel. She is on Xarelto, states has been taking at daily. Denies any nausea, vomiting, diarrhea. No abdominal pain. States "I just don't feel well." Patient is followed by a primary care Dr. in [place]. Nothing is making her symptoms better or worse. Denies urinary symptoms. Denies pregnancy.  Past Medical History  Diagnosis Date  . Lupus (systemic lupus erythematosus)   . Psoriasis   . Gestational diabetes   . Hypertension   . Rheumatoid arthritis(714.0)   . Chronic pain disorder   . Obesity   . Chronic steroid use   . Bronchitis    History reviewed. No pertinent past surgical history. Family History  Problem Relation Age of Onset  . Diabetes Mother   . Hypertension Mother    History  Substance Use Topics  . Smoking status: Never Smoker   . Smokeless tobacco: Never Used  . Alcohol Use: No   OB History   Grav Para Term Preterm Abortions TAB SAB Ect Mult Living                 Review of Systems  Constitutional: Positive for fever and fatigue. Negative for chills.  HENT: Positive for sore throat. Negative for congestion and ear pain.   Respiratory: Positive for chest tightness and shortness of breath.  Negative for cough, wheezing and stridor.   Cardiovascular: Positive for chest pain. Negative for palpitations and leg swelling.  Gastrointestinal: Negative for nausea, vomiting, abdominal pain and diarrhea.  Genitourinary: Negative for dysuria, flank pain, vaginal bleeding, vaginal discharge, vaginal pain and pelvic pain.  Musculoskeletal: Positive for arthralgias and myalgias. Negative for neck pain and neck stiffness.  Skin: Negative for rash.  Neurological: Positive for weakness. Negative for dizziness and headaches.  All other systems reviewed and are negative.     Allergies  Review of patient's allergies indicates no known allergies.  Home Medications   Prior to Admission medications   Medication Sig Start Date End Date Taking? Authorizing Provider  azaTHIOprine (IMURAN) 50 MG tablet Take 50 mg by mouth daily. 02/03/14  Yes Historical Provider, MD  celecoxib (CELEBREX) 100 MG capsule Take 100 mg by mouth 2 (two) times daily. 01/30/14 03/01/14 Yes Historical Provider, MD  DULoxetine (CYMBALTA) 30 MG capsule Take 30 mg by mouth daily. 02/04/14  Yes Historical Provider, MD  gabapentin (NEURONTIN) 100 MG capsule Take 300 mg by mouth 3 (three) times daily. 01/30/14 03/01/14 Yes Historical Provider, MD  hydroxychloroquine (PLAQUENIL) 200 MG tablet Take 200 mg by mouth 2 (two) times daily. 02/03/14 02/03/15 Yes Historical Provider, MD   BP 108/72  Pulse 84  Temp(Src) 99 F (37.2 C) (Oral)  Resp 20  SpO2 95%  LMP 12/13/2013 Physical Exam  Nursing note and vitals reviewed. Constitutional: She is oriented to person, place, and time. She  appears well-developed and well-nourished. No distress.  HENT:  Head: Normocephalic.  Right Ear: External ear normal.  Left Ear: External ear normal.  Nose: Nose normal.  Oropharynx erythematous, tonsils are normal, no exudate, uvula is midline.  Eyes: Conjunctivae and EOM are normal. Pupils are equal, round, and reactive to light.  Neck: Normal range of  motion. Neck supple.  Cardiovascular: Normal rate, regular rhythm and normal heart sounds.   Pulmonary/Chest: Effort normal and breath sounds normal. No respiratory distress. She has no wheezes. She has no rales. She exhibits no tenderness.  Abdominal: Soft. Bowel sounds are normal. She exhibits no distension. There is no tenderness. There is no rebound.  Musculoskeletal: She exhibits no edema.  Neurological: She is alert and oriented to person, place, and time.  Skin: Skin is warm and dry.  Psychiatric: She has a normal mood and affect. Her behavior is normal.    ED Course  Procedures (including critical care time) Labs Review Labs Reviewed  BASIC METABOLIC PANEL - Abnormal; Notable for the following:    Glucose, Bld 111 (*)    All other components within normal limits  RAPID STREP SCREEN  CULTURE, GROUP A STREP  CBC  URINALYSIS, ROUTINE W REFLEX MICROSCOPIC  PRO B NATRIURETIC PEPTIDE  POC URINE PREG, ED  I-STAT TROPOININ, ED  Rosezena Sensor, ED    Imaging Review Dg Chest 2 View  02/12/2014   CLINICAL DATA:  Generalized body aches  EXAM: CHEST  2 VIEW  COMPARISON:  Chest radiograph 09/29/2013  FINDINGS: Normal cardiac silhouette. Low lung volumes with elevation of right hemidiaphragm. There is a perihilar fine airspace disease suggesting pulmonary edema. Cannot exclude bilateral pneumonia. No effusions. No pneumothorax.  IMPRESSION: Perihilar airspace disease suggestive of pulmonary edema. Cannot excluded atypical infection.   Electronically Signed   By: Genevive Bi M.D.   On: 02/12/2014 19:52    Date: 02/13/2014  Rate: 100  Rhythm: normal sinus rhythm  QRS Axis: normal  Intervals: normal  ST/T Wave abnormalities: nonspecific T wave changes  Conduction Disutrbances:none  Narrative Interpretation:   Old EKG Reviewed: unchanged     MDM   Final diagnoses:  CAP (community acquired pneumonia)    Patient with generalized pain, sore throat, weakness. Also  complaining of chest pain or shortness of breath. Labs and CXR pending. VS normal other than HR of 101. She is afebrile.    12:26 AM CXR showing pulmonary edema vs pneumonia. Will add BNP. Pt denies cough.  UA pending. Labs normal.  Pt with multiple complaints. She is immunocompromised on prednisone with hx of lupus and RA, however at this time, no indication for admission. Will start on zithromax, follow up with pcp.   Filed Vitals:   02/13/14 0100 02/13/14 0130 02/13/14 0215 02/13/14 0230  BP: 119/69 132/118 113/67 113/67  Pulse: 90 92 97 93  Temp:      TempSrc:      Resp:   23 23  SpO2: 97% 95% 93% 94%     Donna Melena A Romani Wilbon, PA-C 02/14/14 0041

## 2014-02-14 LAB — CULTURE, GROUP A STREP

## 2014-02-15 ENCOUNTER — Emergency Department (HOSPITAL_COMMUNITY): Payer: Self-pay

## 2014-02-15 ENCOUNTER — Emergency Department (HOSPITAL_COMMUNITY)
Admission: EM | Admit: 2014-02-15 | Discharge: 2014-02-15 | Disposition: A | Discharge status: Home / Self Care | Payer: Self-pay | Attending: Emergency Medicine | Admitting: Emergency Medicine

## 2014-02-15 ENCOUNTER — Encounter (HOSPITAL_COMMUNITY): Payer: Self-pay | Admitting: Emergency Medicine

## 2014-02-15 DIAGNOSIS — R079 Chest pain, unspecified: Secondary | ICD-10-CM | POA: Insufficient documentation

## 2014-02-15 DIAGNOSIS — E669 Obesity, unspecified: Secondary | ICD-10-CM | POA: Insufficient documentation

## 2014-02-15 DIAGNOSIS — Z3202 Encounter for pregnancy test, result negative: Secondary | ICD-10-CM | POA: Insufficient documentation

## 2014-02-15 DIAGNOSIS — Z791 Long term (current) use of non-steroidal anti-inflammatories (NSAID): Secondary | ICD-10-CM | POA: Insufficient documentation

## 2014-02-15 DIAGNOSIS — G8929 Other chronic pain: Secondary | ICD-10-CM | POA: Insufficient documentation

## 2014-02-15 DIAGNOSIS — Z792 Long term (current) use of antibiotics: Secondary | ICD-10-CM | POA: Insufficient documentation

## 2014-02-15 DIAGNOSIS — M329 Systemic lupus erythematosus, unspecified: Secondary | ICD-10-CM | POA: Insufficient documentation

## 2014-02-15 DIAGNOSIS — M069 Rheumatoid arthritis, unspecified: Secondary | ICD-10-CM | POA: Insufficient documentation

## 2014-02-15 DIAGNOSIS — R0789 Other chest pain: Secondary | ICD-10-CM | POA: Insufficient documentation

## 2014-02-15 DIAGNOSIS — Z79899 Other long term (current) drug therapy: Secondary | ICD-10-CM | POA: Insufficient documentation

## 2014-02-15 DIAGNOSIS — L408 Other psoriasis: Secondary | ICD-10-CM | POA: Insufficient documentation

## 2014-02-15 DIAGNOSIS — J029 Acute pharyngitis, unspecified: Secondary | ICD-10-CM | POA: Insufficient documentation

## 2014-02-15 DIAGNOSIS — R52 Pain, unspecified: Secondary | ICD-10-CM | POA: Insufficient documentation

## 2014-02-15 DIAGNOSIS — R51 Headache: Secondary | ICD-10-CM | POA: Insufficient documentation

## 2014-02-15 DIAGNOSIS — I10 Essential (primary) hypertension: Secondary | ICD-10-CM | POA: Insufficient documentation

## 2014-02-15 DIAGNOSIS — Z8632 Personal history of gestational diabetes: Secondary | ICD-10-CM | POA: Insufficient documentation

## 2014-02-15 LAB — CBC WITH DIFFERENTIAL/PLATELET
Basophils Absolute: 0 10*3/uL (ref 0.0–0.1)
Basophils Relative: 0 % (ref 0–1)
EOS PCT: 2 % (ref 0–5)
Eosinophils Absolute: 0.1 10*3/uL (ref 0.0–0.7)
HEMATOCRIT: 40.6 % (ref 36.0–46.0)
HEMOGLOBIN: 12.9 g/dL (ref 12.0–15.0)
LYMPHS PCT: 33 % (ref 12–46)
Lymphs Abs: 1.9 10*3/uL (ref 0.7–4.0)
MCH: 27.3 pg (ref 26.0–34.0)
MCHC: 31.8 g/dL (ref 30.0–36.0)
MCV: 86 fL (ref 78.0–100.0)
MONOS PCT: 4 % (ref 3–12)
Monocytes Absolute: 0.2 10*3/uL (ref 0.1–1.0)
NEUTROS ABS: 3.6 10*3/uL (ref 1.7–7.7)
Neutrophils Relative %: 61 % (ref 43–77)
Platelets: 314 10*3/uL (ref 150–400)
RBC: 4.72 MIL/uL (ref 3.87–5.11)
RDW: 13.4 % (ref 11.5–15.5)
WBC: 5.9 10*3/uL (ref 4.0–10.5)

## 2014-02-15 LAB — I-STAT TROPONIN, ED: Troponin i, poc: 0 ng/mL (ref 0.00–0.08)

## 2014-02-15 LAB — COMPREHENSIVE METABOLIC PANEL
ALT: 23 U/L (ref 0–35)
AST: 43 U/L — AB (ref 0–37)
Albumin: 3.5 g/dL (ref 3.5–5.2)
Alkaline Phosphatase: 90 U/L (ref 39–117)
Anion gap: 15 (ref 5–15)
BILIRUBIN TOTAL: 0.3 mg/dL (ref 0.3–1.2)
BUN: 15 mg/dL (ref 6–23)
CHLORIDE: 103 meq/L (ref 96–112)
CO2: 21 meq/L (ref 19–32)
Calcium: 8.9 mg/dL (ref 8.4–10.5)
Creatinine, Ser: 0.58 mg/dL (ref 0.50–1.10)
Glucose, Bld: 91 mg/dL (ref 70–99)
Potassium: 3.8 mEq/L (ref 3.7–5.3)
Sodium: 139 mEq/L (ref 137–147)
Total Protein: 7.6 g/dL (ref 6.0–8.3)

## 2014-02-15 LAB — URINALYSIS, ROUTINE W REFLEX MICROSCOPIC
BILIRUBIN URINE: NEGATIVE
GLUCOSE, UA: NEGATIVE mg/dL
Hgb urine dipstick: NEGATIVE
KETONES UR: NEGATIVE mg/dL
LEUKOCYTES UA: NEGATIVE
Nitrite: NEGATIVE
PH: 6 (ref 5.0–8.0)
PROTEIN: NEGATIVE mg/dL
Specific Gravity, Urine: 1.029 (ref 1.005–1.030)
Urobilinogen, UA: 0.2 mg/dL (ref 0.0–1.0)

## 2014-02-15 LAB — POC URINE PREG, ED: Preg Test, Ur: NEGATIVE

## 2014-02-15 LAB — RAPID STREP SCREEN (MED CTR MEBANE ONLY): STREPTOCOCCUS, GROUP A SCREEN (DIRECT): NEGATIVE

## 2014-02-15 LAB — PRO B NATRIURETIC PEPTIDE: PRO B NATRI PEPTIDE: 26.4 pg/mL (ref 0–125)

## 2014-02-15 MED ORDER — KETOROLAC TROMETHAMINE 30 MG/ML IJ SOLN
30.0000 mg | Freq: Once | INTRAMUSCULAR | Status: AC
  Administered 2014-02-15: 30 mg via INTRAVENOUS
  Filled 2014-02-15: qty 1

## 2014-02-15 MED ORDER — SODIUM CHLORIDE 0.9 % IV SOLN
Freq: Once | INTRAVENOUS | Status: AC
  Administered 2014-02-15: 10 mL/h via INTRAVENOUS

## 2014-02-15 MED ORDER — HYDROMORPHONE HCL PF 1 MG/ML IJ SOLN: 1.0000 mg | Freq: Once | INTRAMUSCULAR | Status: DC

## 2014-02-15 MED ORDER — PREDNISONE 20 MG PO TABS: ORAL_TABLET | ORAL | Status: DC

## 2014-02-15 MED ORDER — ACETAMINOPHEN 325 MG PO TABS
650.0000 mg | ORAL_TABLET | Freq: Once | ORAL | Status: AC
  Administered 2014-02-15: 650 mg via ORAL

## 2014-02-15 MED ORDER — DEXAMETHASONE SODIUM PHOSPHATE 10 MG/ML IJ SOLN
10.0000 mg | Freq: Once | INTRAMUSCULAR | Status: AC
  Administered 2014-02-15: 10 mg via INTRAVENOUS
  Filled 2014-02-15: qty 1

## 2014-02-15 MED ORDER — HYDROCODONE-ACETAMINOPHEN 5-325 MG PO TABS
2.0000 | ORAL_TABLET | Freq: Once | ORAL | Status: AC
  Administered 2014-02-15: 2 via ORAL
  Filled 2014-02-15: qty 2

## 2014-02-15 MED ORDER — FENTANYL CITRATE 0.05 MG/ML IJ SOLN
50.0000 ug | Freq: Once | INTRAMUSCULAR | Status: AC
  Administered 2014-02-15: 50 ug via INTRAVENOUS

## 2014-02-15 MED ORDER — HYDROCODONE-ACETAMINOPHEN 5-325 MG PO TABS: 2.0000 | ORAL_TABLET | ORAL | Status: DC | PRN

## 2014-02-15 MED ORDER — FENTANYL CITRATE 0.05 MG/ML IJ SOLN
50.0000 ug | Freq: Once | INTRAMUSCULAR | Status: AC
  Administered 2014-02-15: 50 ug via INTRAVENOUS
  Filled 2014-02-15: qty 2

## 2014-02-15 MED ORDER — HYDROMORPHONE HCL PF 1 MG/ML IJ SOLN
1.0000 mg | Freq: Once | INTRAMUSCULAR | Status: AC
  Administered 2014-02-15: 1 mg via INTRAVENOUS
  Filled 2014-02-15: qty 1

## 2014-02-15 NOTE — ED Provider Notes (Signed)
CSN: [id number]     Arrival date & time 02/15/14  0127 History   First MD Initiated Contact with Patient 02/15/14 [phone]     Chief Complaint  Patient presents with  . Chest Pain  . Headache     (Consider location/radiation/quality/duration/timing/severity/associated sxs/prior Treatment) HPI Comments: 28 year old female with rheumatoid arthritis, psoriasis, chronic steroid use, chronic pain, lupus, her blood pressure presents with diffuse body and joint pains, anterior intermittent sharp chest pain, generalized headache gradual onset similar to previous for the past 3 days. She is heavily symptoms in the past. Patient denies any cardiac history, in the chart she had pericardial effusion history. No recent surgeries, blood clot history coming in lateral leg swelling. Patient is also sore throat but denies fevers or chills.  Patient is a 28 y.o. female presenting with chest pain and headaches. The history is provided by the patient. A language interpreter was used.  Chest Pain Associated symptoms: cough and headache   Associated symptoms: no abdominal pain, no back pain, no fever, no shortness of breath, not vomiting and no weakness   Headache Associated symptoms: cough and sore throat   Associated symptoms: no abdominal pain, no back pain, no fever, no neck pain, no neck stiffness and no vomiting     Past Medical History  Diagnosis Date  . Lupus (systemic lupus erythematosus)   . Psoriasis   . Gestational diabetes   . Hypertension   . Rheumatoid arthritis(714.0)   . Chronic pain disorder   . Obesity   . Chronic steroid use   . Bronchitis    History reviewed. No pertinent past surgical history. Family History  Problem Relation Age of Onset  . Diabetes Mother   . Hypertension Mother    History  Substance Use Topics  . Smoking status: Never Smoker   . Smokeless tobacco: Never Used  . Alcohol Use: No   OB History   Grav Para Term Preterm Abortions TAB SAB Ect Mult Living                  Review of Systems  Constitutional: Negative for fever and chills.  HENT: Positive for sore throat.   Eyes: Negative for visual disturbance.  Respiratory: Positive for cough. Negative for shortness of breath.   Cardiovascular: Positive for chest pain and leg swelling.  Gastrointestinal: Negative for vomiting and abdominal pain.  Genitourinary: Negative for dysuria and flank pain.  Musculoskeletal: Positive for arthralgias. Negative for back pain, neck pain and neck stiffness.  Skin: Negative for rash.  Neurological: Positive for headaches. Negative for weakness and light-headedness.      Allergies  Review of patient's allergies indicates no known allergies.  Home Medications   Prior to Admission medications   Medication Sig Start Date End Date Taking? Authorizing Provider  azaTHIOprine (IMURAN) 50 MG tablet Take 50 mg by mouth daily. 02/03/14   Historical Provider, MD  celecoxib (CELEBREX) 100 MG capsule Take 100 mg by mouth 2 (two) times daily. 01/30/14 03/01/14  Historical Provider, MD  DULoxetine (CYMBALTA) 30 MG capsule Take 30 mg by mouth daily. 02/04/14   Historical Provider, MD  gabapentin (NEURONTIN) 100 MG capsule Take 300 mg by mouth 3 (three) times daily. 01/30/14 03/01/14  Historical Provider, MD  HYDROcodone-acetaminophen (NORCO) 5-325 MG per tablet Take 1 tablet by mouth every 6 (six) hours as needed for moderate pain. 02/13/14   Tatyana A Kirichenko, PA-C  HYDROcodone-acetaminophen (NORCO) 5-325 MG per tablet Take 2 tablets by mouth every 4 (four) hours  as needed. 02/15/14   Enid Skeens, MD  hydroxychloroquine (PLAQUENIL) 200 MG tablet Take 200 mg by mouth 2 (two) times daily. 02/03/14 02/03/15  Historical Provider, MD  levofloxacin (LEVAQUIN) 750 MG tablet Take 1 tablet (750 mg total) by mouth daily. 02/13/14   Tatyana A Kirichenko, PA-C  predniSONE (DELTASONE) 20 MG tablet 2 tabs po daily x 3 days 02/16/14   Enid Skeens, MD   BP 117/83  Pulse 84  Temp(Src) 98.5  F (36.9 C) (Oral)  Resp 28  SpO2 95%  LMP 01/12/2014 Physical Exam  Nursing note and vitals reviewed. Constitutional: She is oriented to person, place, and time. She appears well-developed and well-nourished. No distress.  HENT:  Head: Normocephalic and atraumatic.  Eyes: Right eye exhibits no discharge. Left eye exhibits no discharge.  Neck: Normal range of motion. Neck supple. No tracheal deviation present.  Cardiovascular: Normal rate and regular rhythm.   Pulmonary/Chest: Effort normal. She has rales (few crackles bilateral lower, obese).  Abdominal: Soft. She exhibits no distension. There is no tenderness. There is no guarding.  Musculoskeletal: She exhibits tenderness (patient tender diffusely worse in the hands and major joints, no focal joint swelling or warmth.). She exhibits no edema.  Neurological: She is alert and oriented to person, place, and time.  Skin: Skin is warm. No rash noted.  Psychiatric: She has a normal mood and affect.    ED Course  Procedures (including critical care time)   EMERGENCY DEPARTMENT [place] CARDIAC EXAM "Study: Limited Ultrasound of the heart and pericardium"  INDICATIONS:DyspneaCP Multiple views of the heart and pericardium were obtained in real-time with a multi-frequency probe.  PERFORMED MY:TRZNBV  IMAGES ARCHIVED?: Yes  FINDINGS: No pericardial effusion, Normal contractility and Tamponade physiology absent  LIMITATIONS:  Body habitus  VIEWS USED: Parasternal long axis, Parasternal short axis and Apical 4 chamber   INTERPRETATION: Cardiac activity present, Pericardial effusioin absent, Cardiac tamponade absent and Normal contractility   Labs Review Labs Reviewed  COMPREHENSIVE METABOLIC PANEL - Abnormal; Notable for the following:    AST 43 (*)    All other components within normal limits  RAPID STREP SCREEN  CULTURE, GROUP A STREP  CBC WITH DIFFERENTIAL  URINALYSIS, ROUTINE W REFLEX MICROSCOPIC  PRO B NATRIURETIC PEPTIDE   I-STAT TROPOININ, ED  POC URINE PREG, ED    Imaging Review Dg Chest 2 View  02/15/2014   CLINICAL DATA:  Chest pain.  Headache  EXAM: CHEST  2 VIEW  COMPARISON:  02/12/2014  FINDINGS: Apparent cardiomegaly which is likely from hypoaeration. Negative upper mediastinal contours.  Very low lung volumes with interstitial coarsening and interstitial crowding. There is patchy perihilar airspace opacity which is chronic from 2013 at least.  IMPRESSION: 1. Chronic perihilar lung opacities which are accentuated by hypoaeration. 2. No definite acute cardiopulmonary disease, although the above changes could obscure early infection.   Electronically Signed   By: Tiburcio Pea M.D.   On: 02/15/2014 04:27     EKG Interpretation None     EKG reviewed heart rate 92, no acute ST elevation, prolonged QT, normal QRS, no acute findings. Sinus MDM   Final diagnoses:  Rheumatoid arthritis  Systemic lupus erythematosus  Atypical chest pain  Whole body pain   Patient with diffuse body ache and joint pains, atypical chest pain, sore throat. Patient is generally similar symptoms in the past and is on prednisone chronically. Patient getting pain meds steroids and Toradol for pain. Mild improvement on recheck. Repeat oral dose.  Discussed observation for pain control versus close outpatient followup, blood work unremarkable, patient improved and she wishes to go to church today. Language interpreter used. Patient discharged a few pain meds until she sees her primary doctor. Chest x-ray reviewed similar previous.  Bedside ultrasound there is pericardial effusion, no significant pericardial effusion seen. Results and differential diagnosis were discussed with the patient/parent/guardian. Close follow up outpatient was discussed, comfortable with the plan.   Medications  HYDROcodone-acetaminophen (NORCO/VICODIN) 5-325 MG per tablet 2 tablet (not administered)  fentaNYL (SUBLIMAZE) injection 50 mcg (50 mcg  Intravenous Given 02/15/14 0323)  acetaminophen (TYLENOL) tablet 650 mg (650 mg Oral Given 02/15/14 0322)  0.9 %  sodium chloride infusion (10 mL/hr Intravenous New Bag/Given 02/15/14 0333)  fentaNYL (SUBLIMAZE) injection 50 mcg (50 mcg Intravenous Given 02/15/14 0337)  HYDROmorphone (DILAUDID) injection 1 mg (1 mg Intravenous Given 02/15/14 0441)  ketorolac (TORADOL) 30 MG/ML injection 30 mg (30 mg Intravenous Given 02/15/14 0441)  dexamethasone (DECADRON) injection 10 mg (10 mg Intravenous Given 02/15/14 0630)    Filed Vitals:   02/15/14 0330 02/15/14 0400 02/15/14 0430 02/15/14 0631  BP: 101/45 124/81 117/83   Pulse: 81 79 84   Temp:      TempSrc:      Resp: 18 20 28    SpO2: 100% 100% 100% 95%        , MD 02/15/14 [phone]

## 2014-02-15 NOTE — ED Notes (Signed)
No changes, VSS, rates pain 9/10, Dr. Jodi Mourning at [facility] with [place], aleret, NAD, calm, interactive, resps e/u.

## 2014-02-15 NOTE — ED Notes (Signed)
Patient transported to X-ray 

## 2014-02-15 NOTE — ED Notes (Signed)
Pt alert, tearful, resps unlabored & tachypneic, skin W&D. C/o HA, sob, throat pain and chest pain. (denies: fever, nvd or bleeding or abd pain), mentions psoriasis and itching, rash noted on lower back. family at [facility].

## 2014-02-15 NOTE — ED Notes (Signed)
Pt to xray, placed on [place] after fentanyl given

## 2014-02-15 NOTE — ED Notes (Signed)
Used language line to communicate discharge instructions with patient. Verbalized understanding.

## 2014-02-15 NOTE — ED Notes (Signed)
Patient presents with c/o central chest pain, headache

## 2014-02-15 NOTE — Discharge Instructions (Signed)
If you were given medicines take as directed.  If you are on coumadin or contraceptives realize their levels and effectiveness is altered by many different medicines.  If you have any reaction (rash, tongues swelling, other) to the medicines stop taking and see a physician.   Please follow up as directed and return to the ER or see a physician for new or worsening symptoms.  Thank you. Filed Vitals:   02/15/14 0330 02/15/14 0400 02/15/14 0430 02/15/14 0631  BP: 101/45 124/81 117/83   Pulse: 81 79 84   Temp:      TempSrc:      Resp: 18 20 28    SpO2: 100% 100% 100% 95%

## 2014-02-17 LAB — CULTURE, GROUP A STREP

## 2014-02-17 NOTE — ED Provider Notes (Signed)
Medical screening examination/treatment/procedure(s) were performed by non-physician practitioner and as supervising physician I was immediately available for consultation/collaboration.   EKG Interpretation   Date/Time:  Thursday February 12 2014 18:52:16 EDT Ventricular Rate:  100 PR Interval:  130 QRS Duration: 84 QT Interval:  370 QTC Calculation: 477 R Axis:   24 Text Interpretation:  Normal sinus rhythm Minimal voltage criteria for  LVH, may be normal variant Inferior infarct , age undetermined Abnormal  ECG Since last tracing nonspecific inferior T wave abnormality is present  ED PHYSICIAN INTERPRETATION AVAILABLE IN [facility] Confirmed by  TEST, Record ([id number]) on 02/14/2014 11:08:08 AM       Raeford Razor, MD 02/17/14 [phone]

## 2014-02-19 ENCOUNTER — Emergency Department (HOSPITAL_COMMUNITY)
Admission: EM | Admit: 2014-02-19 | Discharge: 2014-02-19 | Disposition: A | Discharge status: Home / Self Care | Payer: Self-pay | Attending: Emergency Medicine | Admitting: Emergency Medicine

## 2014-02-19 ENCOUNTER — Encounter (HOSPITAL_COMMUNITY): Payer: Self-pay | Admitting: Emergency Medicine

## 2014-02-19 ENCOUNTER — Emergency Department (HOSPITAL_COMMUNITY): Payer: Self-pay

## 2014-02-19 DIAGNOSIS — Z872 Personal history of diseases of the skin and subcutaneous tissue: Secondary | ICD-10-CM | POA: Insufficient documentation

## 2014-02-19 DIAGNOSIS — Z8632 Personal history of gestational diabetes: Secondary | ICD-10-CM | POA: Insufficient documentation

## 2014-02-19 DIAGNOSIS — Z7901 Long term (current) use of anticoagulants: Secondary | ICD-10-CM | POA: Insufficient documentation

## 2014-02-19 DIAGNOSIS — Z79899 Other long term (current) drug therapy: Secondary | ICD-10-CM | POA: Insufficient documentation

## 2014-02-19 DIAGNOSIS — R079 Chest pain, unspecified: Secondary | ICD-10-CM | POA: Insufficient documentation

## 2014-02-19 DIAGNOSIS — J189 Pneumonia, unspecified organism: Secondary | ICD-10-CM

## 2014-02-19 DIAGNOSIS — J159 Unspecified bacterial pneumonia: Secondary | ICD-10-CM | POA: Insufficient documentation

## 2014-02-19 DIAGNOSIS — I10 Essential (primary) hypertension: Secondary | ICD-10-CM | POA: Insufficient documentation

## 2014-02-19 DIAGNOSIS — E669 Obesity, unspecified: Secondary | ICD-10-CM | POA: Insufficient documentation

## 2014-02-19 DIAGNOSIS — G894 Chronic pain syndrome: Secondary | ICD-10-CM | POA: Insufficient documentation

## 2014-02-19 DIAGNOSIS — Z9119 Patient's noncompliance with other medical treatment and regimen: Secondary | ICD-10-CM | POA: Insufficient documentation

## 2014-02-19 DIAGNOSIS — Z791 Long term (current) use of non-steroidal anti-inflammatories (NSAID): Secondary | ICD-10-CM | POA: Insufficient documentation

## 2014-02-19 DIAGNOSIS — IMO0002 Reserved for concepts with insufficient information to code with codable children: Secondary | ICD-10-CM | POA: Insufficient documentation

## 2014-02-19 DIAGNOSIS — Z9114 Patient's other noncompliance with medication regimen: Secondary | ICD-10-CM

## 2014-02-19 DIAGNOSIS — Z91199 Patient's noncompliance with other medical treatment and regimen due to unspecified reason: Secondary | ICD-10-CM | POA: Insufficient documentation

## 2014-02-19 DIAGNOSIS — M069 Rheumatoid arthritis, unspecified: Secondary | ICD-10-CM | POA: Insufficient documentation

## 2014-02-19 LAB — BASIC METABOLIC PANEL
Anion gap: 13 (ref 5–15)
BUN: 12 mg/dL (ref 6–23)
CALCIUM: 8.9 mg/dL (ref 8.4–10.5)
CHLORIDE: 104 meq/L (ref 96–112)
CO2: 23 mEq/L (ref 19–32)
CREATININE: 0.43 mg/dL — AB (ref 0.50–1.10)
GFR calc non Af Amer: >90 mL/min (ref >90)
Glucose, Bld: 95 mg/dL (ref 70–99)
Potassium: 4.3 mEq/L (ref 3.7–5.3)
Sodium: 140 mEq/L (ref 137–147)

## 2014-02-19 LAB — CBC
HEMATOCRIT: 43.7 % (ref 36.0–46.0)
Hemoglobin: 14.3 g/dL (ref 12.0–15.0)
MCH: 28.8 pg (ref 26.0–34.0)
MCHC: 32.7 g/dL (ref 30.0–36.0)
MCV: 88.1 fL (ref 78.0–100.0)
Platelets: 347 10*3/uL (ref 150–400)
RBC: 4.96 MIL/uL (ref 3.87–5.11)
RDW: 13.5 % (ref 11.5–15.5)
WBC: 8.4 10*3/uL (ref 4.0–10.5)

## 2014-02-19 LAB — I-STAT TROPONIN, ED: Troponin i, poc: 0 ng/mL (ref 0.00–0.08)

## 2014-02-19 LAB — I-STAT CG4 LACTIC ACID, ED: Lactic Acid, Venous: 0.89 mmol/L (ref 0.5–2.2)

## 2014-02-19 LAB — PRO B NATRIURETIC PEPTIDE: Pro B Natriuretic peptide (BNP): 18.3 pg/mL (ref 0–125)

## 2014-02-19 MED ORDER — LEVOFLOXACIN 750 MG PO TABS
750.0000 mg | ORAL_TABLET | Freq: Once | ORAL | Status: AC
  Administered 2014-02-19: 750 mg via ORAL
  Filled 2014-02-19: qty 1

## 2014-02-19 MED ORDER — SODIUM CHLORIDE 0.9 % IV BOLUS (SEPSIS)
1000.0000 mL | Freq: Once | INTRAVENOUS | Status: AC
  Administered 2014-02-19: 1000 mL via INTRAVENOUS

## 2014-02-19 MED ORDER — HYDROCODONE-ACETAMINOPHEN 7.5-325 MG/15ML PO SOLN: 15.0000 mL | Freq: Three times a day (TID) | ORAL | Status: DC | PRN

## 2014-02-19 MED ORDER — METHYLPREDNISOLONE SODIUM SUCC 125 MG IJ SOLR
125.0000 mg | Freq: Once | INTRAMUSCULAR | Status: AC
  Administered 2014-02-19: 125 mg via INTRAVENOUS
  Filled 2014-02-19: qty 2

## 2014-02-19 MED ORDER — DEXTROSE 5 % IV SOLN
2.0000 g | Freq: Once | INTRAVENOUS | Status: AC
  Administered 2014-02-19: 2 g via INTRAVENOUS
  Filled 2014-02-19: qty 2

## 2014-02-19 MED ORDER — ALBUTEROL SULFATE HFA 108 (90 BASE) MCG/ACT IN AERS
2.0000 | INHALATION_SPRAY | Freq: Once | RESPIRATORY_TRACT | Status: AC
  Administered 2014-02-19: 2 via RESPIRATORY_TRACT
  Filled 2014-02-19: qty 6.7

## 2014-02-19 MED ORDER — AZITHROMYCIN 250 MG PO TABS: ORAL_TABLET | ORAL | Status: DC

## 2014-02-19 MED ORDER — LEVOFLOXACIN 750 MG PO TABS
750.0000 mg | ORAL_TABLET | Freq: Once | ORAL | Status: DC
  Filled 2014-02-19: qty 1

## 2014-02-19 MED ORDER — HYDROCODONE-ACETAMINOPHEN 7.5-325 MG/15ML PO SOLN
10.0000 mL | Freq: Once | ORAL | Status: AC
  Administered 2014-02-19: 10 mL via ORAL
  Filled 2014-02-19: qty 15

## 2014-02-19 NOTE — ED Provider Notes (Signed)
CSN: [id number]     Arrival date & time 02/19/14  1920 History   First MD Initiated Contact with Patient 02/19/14 1920     Chief Complaint  Patient presents with  . Chest Pain     (Consider location/radiation/quality/duration/timing/severity/associated sxs/prior Treatment) HPI  Donna Hall is a 28 y.o. female  With PMH of SLE presenting to the ED for evaluation because she was with her husband at the clinic and started to fell ill and presents to the ed for evaluation of lightheaded snesation patient reports yellow phlegm and cough for one week. Patient was seen and diagnosed with community-acquired pneumonia and given a prescription for Levaquin written on the 20th she has not filled this due to monetary issues. Associated symptoms of shortness of breath and pleuritic chest pain patient also reports a diffuse global headache and states that her bones hurt." Patient denies fever, chills, increasing peripheral edema, calf pain or leg swelling, history of DVT or PE, nausea, vomiting, decreased by mouth intake, change in bowel or bladder habit.     Past Medical History  Diagnosis Date  . Lupus (systemic lupus erythematosus)   . Psoriasis   . Gestational diabetes   . Hypertension   . Rheumatoid arthritis(714.0)   . Chronic pain disorder   . Obesity   . Chronic steroid use   . Bronchitis    History reviewed. No pertinent past surgical history. Family History  Problem Relation Age of Onset  . Diabetes Mother   . Hypertension Mother    History  Substance Use Topics  . Smoking status: Never Smoker   . Smokeless tobacco: Never Used  . Alcohol Use: No   OB History   Grav Para Term Preterm Abortions TAB SAB Ect Mult Living                 Review of Systems  10 systems reviewed and found to be negative, except as noted in the HPI.   Allergies  Review of patient's allergies indicates no known allergies.  Home Medications   Prior to Admission medications    Medication Sig Start Date End Date Taking? Authorizing Provider  azaTHIOprine (IMURAN) 50 MG tablet Take 50 mg by mouth daily. 02/03/14  Yes Historical Provider, MD  celecoxib (CELEBREX) 100 MG capsule Take 100 mg by mouth 2 (two) times daily. 01/30/14 03/01/14 Yes Historical Provider, MD  DULoxetine (CYMBALTA) 30 MG capsule Take 30 mg by mouth daily. 02/04/14  Yes Historical Provider, MD  gabapentin (NEURONTIN) 100 MG capsule Take 300 mg by mouth 3 (three) times daily. 01/30/14 03/01/14 Yes Historical Provider, MD  HYDROcodone-acetaminophen (NORCO/VICODIN) 5-325 MG per tablet Take 1 tablet by mouth every 6 (six) hours as needed for moderate pain.   Yes Historical Provider, MD  hydroxychloroquine (PLAQUENIL) 200 MG tablet Take 200 mg by mouth 2 (two) times daily. 02/03/14 02/03/15 Yes Historical Provider, MD  levofloxacin (LEVAQUIN) 750 MG tablet Take 750 mg by mouth daily.   Yes Historical Provider, MD  azithromycin (ZITHROMAX Z-PAK) 250 MG tablet 2 po day one, then 1 daily x 4 days 02/19/14   Joni Reining Janiel Crisostomo, PA-C  HYDROcodone-acetaminophen (HYCET) 7.5-325 mg/15 ml solution Take 15 mLs by mouth every 8 (eight) hours as needed for moderate pain. 02/19/14   Milarose Savich, PA-C   BP 102/55  Pulse 83  Temp(Src) 98 F (36.7 C) (Oral)  Resp 14  SpO2 98%  LMP 01/12/2014 Physical Exam  Nursing note and vitals reviewed. Constitutional: She is oriented to person,  place, and time. She appears well-developed and well-nourished. No distress.  Obese  HENT:  Head: Normocephalic and atraumatic.  Mouth/Throat: Oropharynx is clear and moist.  Eyes: Conjunctivae and EOM are normal. Pupils are equal, round, and reactive to light.  Neck: Normal range of motion. Neck supple.  Cardiovascular: Normal rate, regular rhythm and intact distal pulses.   Pulmonary/Chest: Effort normal. No stridor. No respiratory distress. She has no wheezes. She has no rales. She exhibits no tenderness.  Coughing profusely, decreased air  movement to bases bilaterally.  Abdominal: Soft. She exhibits no distension and no mass. There is no tenderness. There is no rebound and no guarding.  Musculoskeletal: Normal range of motion.  Follows commands, Clear, goal oriented speech, Strength is 5 out of 5x4 extremities, patient ambulates with a coordinated in nonantalgic gait. Sensation is grossly intact.   Neurological: She is alert and oriented to person, place, and time.  Follows commands, Clear, goal oriented speech, Strength is 5 out of 5x4 extremities, patient ambulates with a coordinated in nonantalgic gait. Sensation is grossly intact.   Psychiatric: She has a normal mood and affect.    ED Course  Procedures (including critical care time) Labs Review Labs Reviewed  BASIC METABOLIC PANEL - Abnormal; Notable for the following:    Creatinine, Ser 0.43 (*)    All other components within normal limits  CBC  PRO B NATRIURETIC PEPTIDE  I-STAT TROPOININ, ED  I-STAT CG4 LACTIC ACID, ED    Imaging Review Dg Chest 2 View  02/19/2014   CLINICAL DATA:  Intermittent Mid chest pain. Exertional dyspnea. Productive cough. Nausea and headache onset today.  EXAM: CHEST  2 VIEW  COMPARISON:  02/15/2014  FINDINGS: Shallow inspiration. Borderline heart size and pulmonary vascularity, likely normal for degree of inspiration. No focal airspace disease or consolidation. No blunting of costophrenic angles. No pneumothorax.  IMPRESSION: No active cardiopulmonary disease.   Electronically Signed   By: Burman Nieves M.D.   On: 02/19/2014 21:18     EKG Interpretation   Date/Time:  Thursday February 19 2014 19:27:07 EDT Ventricular Rate:  83 PR Interval:  130 QRS Duration: 88 QT Interval:  387 QTC Calculation: 455 R Axis:   53 Text Interpretation:  Sinus rhythm Similar to prior Confirmed by Gwendolyn Grant   MD, BLAIR ([id number]) on 02/19/2014 7:34:15 PM      MDM   Final diagnoses:  CAP (community acquired pneumonia)  Noncompliance with medication  regimen    Filed Vitals:   02/19/14 1930 02/19/14 2038 02/19/14 2041 02/19/14 2230  BP: 118/50 106/64  102/55  Pulse: 72 67  83  Temp: 98.2 F (36.8 C)  98 F (36.7 C)   TempSrc: Oral  Oral   Resp: 29 14    SpO2: 95% 98%  98%    Medications  cefTRIAXone (ROCEPHIN) 2 g in dextrose 5 % 50 mL IVPB (0 g Intravenous Stopped 02/19/14 2235)  sodium chloride 0.9 % bolus 1,000 mL (0 mLs Intravenous Stopped 02/19/14 2235)  albuterol (PROVENTIL HFA;VENTOLIN HFA) 108 (90 BASE) MCG/ACT inhaler 2 puff (2 puffs Inhalation Given 02/19/14 2134)  methylPREDNISolone sodium succinate (SOLU-MEDROL) 125 mg/2 mL injection 125 mg (125 mg Intravenous Given 02/19/14 2142)  HYDROcodone-acetaminophen (HYCET) 7.5-325 mg/15 ml solution 10 mL (10 mLs Oral Given 02/19/14 2133)  levofloxacin (LEVAQUIN) tablet 750 mg (750 mg Oral Given 02/19/14 2147)    Donna Hall is a 28 y.o. female presenting with productive cough, pleuritic chest pain and fatigue. Patient also reports headache.  EKG is nonischemic, troponin is negative. Patient has reassuring blood work with no abnormalities. Chest x-ray with no infiltrate however patient is coughing profusely and initially tachypnea. I will start her on antibiotics, encourage medication compliance.  Evaluation does not show pathology that would require ongoing emergent intervention or inpatient treatment. Pt is hemodynamically stable and mentating appropriately. Discussed findings and plan with patient/guardian, who agrees with care plan. All questions answered. Return precautions discussed and outpatient follow up given.   Discharge Medication List as of 02/19/2014  9:50 PM    START taking these medications   Details  azithromycin (ZITHROMAX Z-PAK) 250 MG tablet 2 po day one, then 1 daily x 4 days, Print    HYDROcodone-acetaminophen (HYCET) 7.5-325 mg/15 ml solution Take 15 mLs by mouth every 8 (eight) hours as needed for moderate pain., Starting 02/19/2014, Until  Discontinued, [employer], PA-C 02/20/14 [phone]

## 2014-02-19 NOTE — Discharge Instructions (Signed)
Tome Hycet para la tos y Chief Technology Officer. No beba alcohol , conducir , el cuidado de los nios o hacer otras tareas crticas al [employer] .  Tome sus antibiticos segn las indicaciones y Elaina Hoops su finalizacin. Usted nunca debera tener ningn antibiticos sobrantes ! Empuje lquidos y [place] hidratado.  No dudara en volver a la sala de emergencias por cualquier nuevo empeoramiento ni respecto de los sntomas.  Por favor, obtener atencin primaria utilizando la gua de recursos a continuacin . Pero el momento en que fueron vistos en la sala de emergencia y que Zenaida Niece a necesitar para obtener los registros para su posterior manejo ambulatorio .   Take Hycet  for cough and pain. Do not drink alcohol, drive, care for children or do other critical tasks while taking Hycet.   Take your antibiotics as directed and to completion. You should never have any leftover antibiotics! Push fluids and stay well hydrated.   Do not hesitate to return to the emergency room for any new, worsening or concerning symptoms.  Please obtain primary care using resource guide below. But the minute you were seen in the emergency room and that they will need to obtain records for further outpatient management.   Emergency Department Resource Guide 1) Find a Doctor and Pay Out of Pocket Although you won't have to find out who is covered by your insurance plan, it is a good idea to ask around and get recommendations. You will then need to call the office and see if the doctor you have chosen will accept you as a new patient and what types of options they offer for patients who are self-pay. Some doctors offer discounts or will set up payment plans for their patients who do not have insurance, but you will need to ask so you aren't surprised when you get to your appointment.  2) Contact Your Local Health Department Not all health departments have doctors that can see patients for sick visits, but many do, so it is worth  a call to see if yours does. If you don't know where your local health department is, you can check in your phone book. The CDC also has a tool to help you locate your state's health department, and many state websites also have listings of all of their local health departments.  3) Find a Walk-in Clinic If your illness is not likely to be very severe or complicated, you may want to try a walk in clinic. These are popping up all over the country in pharmacies, drugstores, and shopping centers. They're usually staffed by nurse practitioners or physician assistants that have been trained to treat common illnesses and complaints. They're usually fairly quick and inexpensive. However, if you have serious medical issues or chronic medical problems, these are probably not your best option.  No Primary Care Doctor: - Call Health Connect at  [phone] - they can help you locate a primary care doctor that  accepts your insurance, provides certain services, etc. - Physician Referral Service- [phone]  Chronic Pain Problems: Organization         Address  Phone   Notes  [facility]  [phone] Patients need to be referred by their primary care doctor.   Medication Assistance: Organization         Address  Phone   Notes  [facility] Medication [facility] [address] [place] [place], [place] [address] [phone] --Must be a resident of [facility] --  Must have NO insurance coverage whatsoever (no Medicaid/ Medicare, etc.) -- The pt. MUST have a primary care doctor that directs their care regularly and follows them in the community   [facility]  [phone]   [employer]  [phone]    Agencies that provide inexpensive medical care: Organization         Address  Phone   Notes  [facility]  [phone]   [facility]    [phone]   [facility] [address] [place], [place]  [address] [phone]   [facility] [address] [place], [place] [phone]   Planned Parenthood    [phone]   [facility]    [phone]   Community Health and [facility]  [address], [place] Phone:  [phone], Fax:  [phone] Hours of Operation:  9 am - 6 pm, M-F.  Also accepts Medicaid/Medicare and self-pay.  [facility]  [address], [place] Phone: [phone], Fax: [phone]. Hours of Operation:  8:30 am - 5:30 pm, M-F.  Also accepts Medicaid and self-pay.  [facility] [place] [address], [place] Phone: [phone]   [facility] [address] Natasha Bence [place], [place] [phone], Ext. [phone] Mondays & Thursdays: 7-9 AM.  First 15 patients are seen on a first come, first serve basis.    Medicaid-accepting [facility] Providers:  Organization         Address  Phone   Notes  [facility] [address] [place] [address], [place] [phone] Also accepts self-pay patients.  [facility] [address] Laurell Josephs [place], [place]  [phone]   [facility] [address], [place] [phone]   [facility] Family Medicine [address] [place] [phone]   Renaye Rakers [address], [place]   [phone] Only accepts [place] Access [place] patients after they have their name applied to their card.   Self-Pay (no insurance) in [facility]:  Organization         Address  Phone   Notes  Sickle Cell Patients, [facility] Internal Medicine [address] [place], [place] [phone]   [facility] [address] [place], [place] [phone]   [facility] [place]  [address], [place] [phone]   [facility]/Dr. Osei-Bonsu  [address] [place] or [address], [place] [phone] Phone number for both [place] and [place] locations is the same.  Urgent Medical and [facility] [address], [place] [phone]   [facility] [address], [place] or [address] [phone]   [facility] [address], [place] [phone], phone; [phone], fax Sees patients 1st and 3rd Saturday of every month.  Must not qualify for public or private insurance (i.e. Medicaid, Medicare, [place] Health Choice, Veterans' Benefits)  Household income should be no more than 200% of the poverty level The clinic cannot treat you if you are pregnant or think you are pregnant  Sexually transmitted diseases are not treated at the clinic.    Dental Care: Organization         Address  Phone  Notes  [facility] Department of [facility] [address] [place], [place] [phone] Accepts children up to age 47 who are enrolled in [place] or [place] Health Choice; pregnant women with a Medicaid card; and children who have applied for Medicaid or [place] Health Choice, but were declined, whose parents can pay a reduced fee at time of service.  [facility] Department of [facility]  [address], [place] [phone] Accepts children up to age 24 who are enrolled in [place] or [place] Health Choice; pregnant women with a Medicaid card; and children who have applied for Medicaid or [place] Health Choice, but were declined, whose parents can pay a reduced fee at time of service.  [facility]  [address] [place], [place] [phone] Patients are seen by appointment only. Walk-ins are not accepted. [facility] will see patients 52 years of age and older. Monday - Tuesday (8am-5pm) Most Wednesdays (8:30-5pm) $30 per visit, cash only  [facility] Adult Dental Access [facility]  [address], [facility] [phone]  Patients are seen by appointment only. Walk-ins are not accepted. [facility] will see patients 53 years of age and older. One Wednesday Evening (Monthly: Volunteer Based).  $30 per visit, cash only  [employer]  [phone] for adults; Children under age 64, call Graduate Pediatric Dentistry at [phone]. Children aged 73-14, please call [phone] to request a pediatric application.  Dental services are provided in all areas of dental care including fillings, crowns and bridges, complete and partial dentures, implants, gum treatment, root canals, and extractions. Preventive care is also provided. Treatment is provided to both adults and children. Patients are selected via a lottery and there is often a waiting list.   [facility] [address] [place]  [phone] [email or web address]   [facility] [address] [place], [place] [phone], Ext. [phone] Second and Fourth Thursday of each month, opens at 6:30 AM; Clinic ends at 9 AM.  Patients are seen on a first-come first-served basis, and a limited number are seen during each clinic.   [facility]  [address] Ether [place], [place] [phone]   Eligibility Requirements You must have lived in [place], [place], or [place] counties for at least the last three months.   You cannot be eligible for state or federal sponsored [place], including [employer], [place], or [employer].   You generally cannot be eligible for healthcare insurance through your employer.    How to apply: Eligibility screenings are held every Tuesday and Wednesday afternoon from 1:00 pm until 4:00 pm. You do not need an appointment for the interview!  [facility] [address] [place], [place] [phone]   [facility] Health Department  [phone]   [facility] Health Department  [phone]   [facility] Health Department   [phone]    Behavioral Health Resources in the Community: Intensive Outpatient Programs Organization         Address  Phone  Notes  [facility] [address], [place], [place] [phone]   [facility] Outpatient [address] [place], [place] [phone]   [facility] [address], [place], [place]  [phone]   [facility] Mental Health [address],  [place], [place] [phone] or [phone]   Substance Abuse Resources Organization  Address  Phone  Notes  [facility]  [phone]   [facility]  [phone]   [facility] [place]  [phone]   Floydene Flock  [phone]   Residential & Outpatient Substance Abuse Program  [phone]   Psychological Services Organization         Address  Phone  Notes  [facility] Health  [phone]   [facility]  [phone]   [facility] [address], [place] [phone] or [phone]    Mobile Crisis Teams Organization         Address  Phone  Notes  [facility], Mobile Crisis Care Unit  [phone]   [facility]  [address] [place], [place] [phone]   Doristine Locks [address] [place] [phone]    Self-Help/Support Groups Organization         Address  Phone             Notes  [facility] [place] - variety of support groups  336- I7437963 Call for more information  Narcotics Anonymous (NA), [facility] [address], [employer] [place]  2 meetings at this location   Statistician         Address  Phone  Notes  [facility] [address],    [place]  [phone]   [facility]  [address] [place] [address], [place], [place] [phone]   [facility] [address] [place], [place] [phone] Admissions: 8am-3pm M-F   [facility] [address]    [place], [place] [phone]   [facility] [address] [place], [place], [place] [phone]   [facility] [address]  [place], [place] [phone]   [facility] - Intensive Outpatient [address] Laurell Josephs [address], [place], [place] [phone]   [facility] [address] [place]  [place], [place] [phone] or [phone]   [facility] ([facility]) [address] [place], [place] [phone] Accepts Medicaid  [facility] [address]  [place] [phone] Substance Abuse/Addiction Treatment   [facility] [place] Organization         Address  Phone  Notes  [facility]  ([phone]) [phone]   Angie Fava, PhD [address] Ervin Knack [place], [place]   [phone] or [phone]   [facility] Behavioral   [address] [place], [place] [phone]   [facility] [address], [place], [place] [phone] Insurance/Medicaid/sponsorship through [facility] and Families [address]                                    [place], [place] [phone] Therapy/tele-psych/case  [facility] [address], [place] [phone]    Dr. Lolly Mustache  [phone]   [facility]  [employer] [facility] Dept. 1) [address] [place] 2) [address], [place] 3)  [address], [place] [phone]  [phone]   [facility] Child Abuse Hotline 930-318-1268 or 8306205518 (After Hours)

## 2014-02-19 NOTE — ED Notes (Signed)
Pt. arrived with EMS reports intermittent mid chest pain with exertional dyspnea , productive cough , nausea and headache onset today . Chest pain worse with deep inspiration . Received 4 baby ASA from EMS .

## 2014-02-20 NOTE — ED Provider Notes (Signed)
Medical screening examination/treatment/procedure(s) were performed by non-physician practitioner and as supervising physician I was immediately available for consultation/collaboration.   EKG Interpretation   Date/Time:  Thursday February 19 2014 19:27:07 EDT Ventricular Rate:  83 PR Interval:  130 QRS Duration: 88 QT Interval:  387 QTC Calculation: 455 R Axis:   53 Text Interpretation:  Sinus rhythm Similar to prior Confirmed by Gwendolyn Grant   MD, Bentleigh Waren ([id number]) on 02/19/2014 7:34:15 PM         Elwin Mocha, MD 02/20/14 2253

## 2014-02-23 ENCOUNTER — Ambulatory Visit: Payer: Self-pay | Attending: Family Medicine | Admitting: Family Medicine

## 2014-02-23 ENCOUNTER — Encounter: Payer: Self-pay | Admitting: Family Medicine

## 2014-02-23 VITALS — BP 111/72 | HR 79 | Temp 99.7°F | Resp 20 | Ht 63.0 in | Wt 282.0 lb

## 2014-02-23 DIAGNOSIS — R079 Chest pain, unspecified: Secondary | ICD-10-CM | POA: Insufficient documentation

## 2014-02-23 DIAGNOSIS — F3289 Other specified depressive episodes: Secondary | ICD-10-CM

## 2014-02-23 DIAGNOSIS — R739 Hyperglycemia, unspecified: Secondary | ICD-10-CM

## 2014-02-23 DIAGNOSIS — IMO0002 Reserved for concepts with insufficient information to code with codable children: Secondary | ICD-10-CM

## 2014-02-23 DIAGNOSIS — F329 Major depressive disorder, single episode, unspecified: Secondary | ICD-10-CM

## 2014-02-23 DIAGNOSIS — I319 Disease of pericardium, unspecified: Secondary | ICD-10-CM

## 2014-02-23 DIAGNOSIS — M329 Systemic lupus erythematosus, unspecified: Secondary | ICD-10-CM | POA: Insufficient documentation

## 2014-02-23 DIAGNOSIS — R7309 Other abnormal glucose: Secondary | ICD-10-CM

## 2014-02-23 DIAGNOSIS — M069 Rheumatoid arthritis, unspecified: Secondary | ICD-10-CM | POA: Insufficient documentation

## 2014-02-23 DIAGNOSIS — R0602 Shortness of breath: Secondary | ICD-10-CM | POA: Insufficient documentation

## 2014-02-23 DIAGNOSIS — J8410 Pulmonary fibrosis, unspecified: Secondary | ICD-10-CM

## 2014-02-23 DIAGNOSIS — Z0000 Encounter for general adult medical examination without abnormal findings: Secondary | ICD-10-CM | POA: Insufficient documentation

## 2014-02-23 DIAGNOSIS — F32A Depression, unspecified: Secondary | ICD-10-CM

## 2014-02-23 DIAGNOSIS — Z23 Encounter for immunization: Secondary | ICD-10-CM

## 2014-02-23 LAB — POCT GLYCOSYLATED HEMOGLOBIN (HGB A1C): Hemoglobin A1C: 5.5

## 2014-02-23 MED ORDER — ALBUTEROL SULFATE HFA 108 (90 BASE) MCG/ACT IN AERS: 2.0000 | INHALATION_SPRAY | RESPIRATORY_TRACT | Status: DC | PRN

## 2014-02-23 MED ORDER — HYDROXYCHLOROQUINE SULFATE 200 MG PO TABS: 200.0000 mg | ORAL_TABLET | Freq: Two times a day (BID) | ORAL | Status: DC

## 2014-02-23 MED ORDER — CELECOXIB 100 MG PO CAPS: 100.0000 mg | ORAL_CAPSULE | Freq: Two times a day (BID) | ORAL | Status: DC

## 2014-02-23 MED ORDER — CELECOXIB 200 MG PO CAPS: 200.0000 mg | ORAL_CAPSULE | Freq: Two times a day (BID) | ORAL | Status: DC

## 2014-02-23 MED ORDER — FLUOXETINE HCL 20 MG PO CAPS: 20.0000 mg | ORAL_CAPSULE | Freq: Every day | ORAL | Status: DC

## 2014-02-23 MED ORDER — AZATHIOPRINE 50 MG PO TABS: 50.0000 mg | ORAL_TABLET | Freq: Every day | ORAL | Status: DC

## 2014-02-23 MED ORDER — AZATHIOPRINE 50 MG PO TABS: 250.0000 mg | ORAL_TABLET | Freq: Every day | ORAL | Status: DC

## 2014-02-23 NOTE — Progress Notes (Signed)
Establish Care HFU dizziness and chest pain, stated no pain, no dizziness since ER visit

## 2014-02-23 NOTE — Assessment & Plan Note (Signed)
A: stable Meds: non compliant due to cost P: refilled plaquaneil, celebrex, azathioprine  

## 2014-02-23 NOTE — Progress Notes (Signed)
   Subjective:    Patient ID: Donna Hall, female    DOB: 27-Jun-1985, 28 y.o.   MRN: [id number] CC: establish care, HFU for  HPI 28 year old female presents to establish care discussed the following:  #1 ED f./u CP and SOB: no longer with CP. Still with SOB on exertion. No fever or chills. Dry cough. Has history of interstial lung dz, moderate pericardial effusion in 2009. On chronic prednisone therapy. Almost done with azithromycin   #2 RA/lupus: dx in 2010. On prednisone since 2010. Has [facility]. Non compliant with DMARDs due to cost of medications at [facility]. Currently only taking prednisone and celebrex.   Review of Systems As per HPI     Objective:   Physical Exam BP 111/72  Pulse 79  Temp(Src) 99.7 F (37.6 C) (Oral)  Resp 20  Ht 5\' 3"  (1.6 m)  Wt 282 lb (127.914 kg)  BMI 49.97 kg/m2  SpO2 95%  LMP 01/12/2014 General appearance: alert, cooperative, no distress and morbidly obese Head: Normocephalic, without obvious abnormality, atraumatic Eyes: conjunctivae/corneas clear. PERRL, EOM's intact.  Ears: normal TM's and external ear canals both ears Nose: no discharge, turbinates pink, swollen Throat: small jaw, oropharynx pink and moist  Neck: thick, no adenopathy, thyroid normal Lungs: clear to auscultation bilaterally Heart: regular rate and rhythm, S1, S2 normal, no murmur, click, rub or gallop Extremities: extremities normal, atraumatic, no cyanosis or edema, tenderness in arms and legs  Skin: increase adipose tissue and hyperpigmentation on upper back with striae      Assessment & Plan:

## 2014-02-23 NOTE — Assessment & Plan Note (Signed)
Check vitamin D. 

## 2014-02-23 NOTE — Assessment & Plan Note (Signed)
sats down to 90s when ambulating. New rx, albuterol

## 2014-02-23 NOTE — Assessment & Plan Note (Signed)
A: stable Meds: non compliant due to cost P: refilled plaquaneil, celebrex, azathioprine

## 2014-02-23 NOTE — Addendum Note (Signed)
Addended by: Dessa Phi on: 02/23/2014 06:47 PM   Modules accepted: Orders

## 2014-02-23 NOTE — Assessment & Plan Note (Signed)
Normal A1c today. 

## 2014-02-23 NOTE — Patient Instructions (Addendum)
Mrs. Marney Setting por venido hoy. It was a pleasure meeting you. I look forward to being your primary doctor.  1. For shortness of breath: use albuterol before physical activity and as needed for shortness of breath. Up to every 6 hrs.   2. For lupus and rheumatoid arthritis: Keep f/u with your rheumatologist at [facility].  I have refilled medications to our on site pharmacy. Please apply for the pass program.  3. Flu shot: schedule nurse  Visit for end of the week, as long as your temp is normal you can get your flu shot.   4. For pap smear: schedule your pap smear at you earliest convenience.  Dr. Armen Pickup

## 2014-02-24 ENCOUNTER — Telehealth: Payer: Self-pay | Admitting: Family Medicine

## 2014-02-24 ENCOUNTER — Telehealth: Payer: Self-pay | Admitting: *Deleted

## 2014-02-24 LAB — VITAMIN D 25 HYDROXY (VIT D DEFICIENCY, FRACTURES): VIT D 25 HYDROXY: 33 ng/mL (ref 30–89)

## 2014-02-24 NOTE — Telephone Encounter (Signed)
Pt returning call regarding results please f/u with pt.

## 2014-02-24 NOTE — Telephone Encounter (Signed)
Pt aware of Lab results 

## 2014-02-24 NOTE — Telephone Encounter (Signed)
Message copied by Dyann Kief on Tue Feb 24, 2014  4:05 PM ------      Message from: Dessa Phi      Created: Tue Feb 24, 2014  2:14 PM       Please inform patient       Vitamin D normal ------

## 2014-02-26 ENCOUNTER — Ambulatory Visit: Payer: Self-pay | Attending: Family Medicine

## 2014-03-05 ENCOUNTER — Encounter: Payer: Self-pay | Admitting: Family Medicine

## 2014-03-11 ENCOUNTER — Encounter (HOSPITAL_COMMUNITY): Payer: Self-pay | Admitting: Emergency Medicine

## 2014-03-11 ENCOUNTER — Emergency Department (HOSPITAL_COMMUNITY): Payer: Medicaid Other

## 2014-03-11 ENCOUNTER — Observation Stay (HOSPITAL_COMMUNITY)
Admission: EM | Admit: 2014-03-11 | Discharge: 2014-03-12 | Disposition: A | Discharge status: Home / Self Care | Payer: Medicaid Other | Attending: Emergency Medicine | Admitting: Emergency Medicine

## 2014-03-11 DIAGNOSIS — M069 Rheumatoid arthritis, unspecified: Secondary | ICD-10-CM | POA: Diagnosis not present

## 2014-03-11 DIAGNOSIS — M329 Systemic lupus erythematosus, unspecified: Secondary | ICD-10-CM | POA: Diagnosis present

## 2014-03-11 DIAGNOSIS — J189 Pneumonia, unspecified organism: Secondary | ICD-10-CM | POA: Diagnosis present

## 2014-03-11 DIAGNOSIS — R079 Chest pain, unspecified: Secondary | ICD-10-CM | POA: Insufficient documentation

## 2014-03-11 DIAGNOSIS — R0902 Hypoxemia: Secondary | ICD-10-CM | POA: Diagnosis not present

## 2014-03-11 DIAGNOSIS — Z872 Personal history of diseases of the skin and subcutaneous tissue: Secondary | ICD-10-CM | POA: Diagnosis not present

## 2014-03-11 DIAGNOSIS — G894 Chronic pain syndrome: Secondary | ICD-10-CM | POA: Diagnosis not present

## 2014-03-11 DIAGNOSIS — E669 Obesity, unspecified: Secondary | ICD-10-CM | POA: Diagnosis not present

## 2014-03-11 DIAGNOSIS — M2550 Pain in unspecified joint: Secondary | ICD-10-CM | POA: Diagnosis not present

## 2014-03-11 DIAGNOSIS — Z8632 Personal history of gestational diabetes: Secondary | ICD-10-CM | POA: Insufficient documentation

## 2014-03-11 DIAGNOSIS — Z79899 Other long term (current) drug therapy: Secondary | ICD-10-CM | POA: Insufficient documentation

## 2014-03-11 DIAGNOSIS — J8410 Pulmonary fibrosis, unspecified: Secondary | ICD-10-CM | POA: Diagnosis present

## 2014-03-11 DIAGNOSIS — IMO0002 Reserved for concepts with insufficient information to code with codable children: Secondary | ICD-10-CM | POA: Insufficient documentation

## 2014-03-11 DIAGNOSIS — I10 Essential (primary) hypertension: Secondary | ICD-10-CM | POA: Diagnosis not present

## 2014-03-11 DIAGNOSIS — J159 Unspecified bacterial pneumonia: Principal | ICD-10-CM | POA: Insufficient documentation

## 2014-03-11 LAB — CBC
HEMATOCRIT: 42.2 % (ref 36.0–46.0)
Hemoglobin: 13.9 g/dL (ref 12.0–15.0)
MCH: 28.3 pg (ref 26.0–34.0)
MCHC: 32.9 g/dL (ref 30.0–36.0)
MCV: 85.9 fL (ref 78.0–100.0)
Platelets: 238 10*3/uL (ref 150–400)
RBC: 4.91 MIL/uL (ref 3.87–5.11)
RDW: 14.2 % (ref 11.5–15.5)
WBC: 6.6 10*3/uL (ref 4.0–10.5)

## 2014-03-11 LAB — BASIC METABOLIC PANEL
ANION GAP: 10 (ref 5–15)
BUN: 13 mg/dL (ref 6–23)
CO2: 25 mEq/L (ref 19–32)
CREATININE: 0.45 mg/dL — AB (ref 0.50–1.10)
Calcium: 8.5 mg/dL (ref 8.4–10.5)
Chloride: 104 mEq/L (ref 96–112)
Glucose, Bld: 107 mg/dL — ABNORMAL HIGH (ref 70–99)
Potassium: 3.6 mEq/L — ABNORMAL LOW (ref 3.7–5.3)
SODIUM: 139 meq/L (ref 137–147)

## 2014-03-11 LAB — I-STAT TROPONIN, ED: TROPONIN I, POC: 0 ng/mL (ref 0.00–0.08)

## 2014-03-11 LAB — PRO B NATRIURETIC PEPTIDE: Pro B Natriuretic peptide (BNP): 25.2 pg/mL (ref 0–125)

## 2014-03-11 LAB — TROPONIN I: Troponin I: <0.3 ng/mL (ref <0.30)

## 2014-03-11 MED ORDER — MORPHINE SULFATE 4 MG/ML IJ SOLN
4.0000 mg | Freq: Once | INTRAMUSCULAR | Status: AC
  Administered 2014-03-11: 4 mg via INTRAVENOUS
  Filled 2014-03-11: qty 1

## 2014-03-11 MED ORDER — ALBUTEROL SULFATE (2.5 MG/3ML) 0.083% IN NEBU
5.0000 mg | INHALATION_SOLUTION | Freq: Once | RESPIRATORY_TRACT | Status: AC
  Filled 2014-03-11: qty 6

## 2014-03-11 MED ORDER — IOHEXOL 350 MG/ML SOLN
100.0000 mL | Freq: Once | INTRAVENOUS | Status: AC | PRN
  Administered 2014-03-11: 100 mL via INTRAVENOUS

## 2014-03-11 MED ORDER — ALBUTEROL SULFATE (2.5 MG/3ML) 0.083% IN NEBU
5.0000 mg | INHALATION_SOLUTION | Freq: Once | RESPIRATORY_TRACT | Status: AC
  Administered 2014-03-11: 5 mg via RESPIRATORY_TRACT

## 2014-03-11 MED ORDER — METHYLPREDNISOLONE SODIUM SUCC 125 MG IJ SOLR
125.0000 mg | Freq: Once | INTRAMUSCULAR | Status: AC
  Administered 2014-03-11: 125 mg via INTRAVENOUS
  Filled 2014-03-11: qty 2

## 2014-03-11 MED ORDER — SODIUM CHLORIDE 0.9 % IV BOLUS (SEPSIS)
1000.0000 mL | Freq: Once | INTRAVENOUS | Status: AC
  Administered 2014-03-11: 1000 mL via INTRAVENOUS

## 2014-03-11 MED ORDER — OXYCODONE-ACETAMINOPHEN 5-325 MG PO TABS
2.0000 | ORAL_TABLET | Freq: Once | ORAL | Status: AC
Start: 2014-03-11 — End: 2014-03-11
  Administered 2014-03-11: 2 via ORAL
  Filled 2014-03-11: qty 2

## 2014-03-11 MED ORDER — ALBUTEROL SULFATE (2.5 MG/3ML) 0.083% IN NEBU
5.0000 mg | INHALATION_SOLUTION | Freq: Once | RESPIRATORY_TRACT | Status: AC
  Administered 2014-03-11: 5 mg via RESPIRATORY_TRACT
  Filled 2014-03-11: qty 6

## 2014-03-11 MED ORDER — IPRATROPIUM BROMIDE 0.02 % IN SOLN
0.5000 mg | Freq: Once | RESPIRATORY_TRACT | Status: AC
  Administered 2014-03-11: 0.5 mg via RESPIRATORY_TRACT
  Filled 2014-03-11: qty 2.5

## 2014-03-11 NOTE — ED Notes (Signed)
Pt ambulated to restroom. Gait steady.  

## 2014-03-11 NOTE — ED Notes (Signed)
Pt reports today having mid chest pain, non productive cough. Reports pain to all her joints and swelling to left leg. ekg done at triage.

## 2014-03-11 NOTE — ED Provider Notes (Signed)
CSN: [id number]     Arrival date & time 03/11/14  1504 History   First MD Initiated Contact with Patient 03/11/14 1916     Chief Complaint  Patient presents with  . Chest Pain  . Joint Pain     (Consider location/radiation/quality/duration/timing/severity/associated sxs/prior Treatment) The history is provided by the patient.    Patient with hx lupus, rheumatoid arthritis, psoriasis, on prednisone presents with chest pain, shortness of breath, cough productive of yellow sputum, headaches, body aches, joint pain.  The cough began 3 days ago, the SOB two days ago, the chest pain began yesterday.  The chest pain is anterior/central, sharp, worse with attempting to breathe.   She is concerned she missed last 3 month injection and this is why she is having increased joint pain.  Ran out of prednisone yesterday.  Was on it for two weeks. States when she is off prednisone she also has increased joint pain.   Past Medical History  Diagnosis Date  . Psoriasis 2010    as a child  . Chronic pain disorder   . Obesity   . Chronic steroid use 2010  . Bronchitis   . Lupus 2000  . Lupus (systemic lupus erythematosus) 2010  . Rheumatoid arthritis(714.0) 2010  . Hypertension 2010  . Gestational diabetes 2006   Past Surgical History  Procedure Laterality Date  . R thigh biopsy Right 2011   Family History  Problem Relation Age of Onset  . Diabetes Mother   . Hypertension Mother   . Cancer Neg Hx   . Early death Neg Hx   . Heart disease Neg Hx    History  Substance Use Topics  . Smoking status: Never Smoker   . Smokeless tobacco: Never Used  . Alcohol Use: No   OB History   Grav Para Term Preterm Abortions TAB SAB Ect Mult Living                 Review of Systems  All other systems reviewed and are negative.     Allergies  Review of patient's allergies indicates no known allergies.  Home Medications   Prior to Admission medications   Medication Sig Start Date End Date  Taking? Authorizing Provider  albuterol (PROVENTIL HFA;VENTOLIN HFA) 108 (90 BASE) MCG/ACT inhaler Inhale 2 puffs into the lungs every 4 (four) hours as needed for wheezing or shortness of breath.   Yes Historical Provider, MD  azaTHIOprine (IMURAN) 50 MG tablet Take 250 mg by mouth daily.   Yes Historical Provider, MD  celecoxib (CELEBREX) 200 MG capsule Take 200 mg by mouth 2 (two) times daily.   Yes Historical Provider, MD  FLUoxetine (PROZAC) 20 MG capsule Take 20 mg by mouth daily.   Yes Historical Provider, MD  gabapentin (NEURONTIN) 300 MG capsule Take 300 mg by mouth 3 (three) times daily.   Yes Historical Provider, MD  hydroxychloroquine (PLAQUENIL) 200 MG tablet Take 200 mg by mouth 2 (two) times daily.   Yes Historical Provider, MD  predniSONE (DELTASONE) 2.5 MG tablet Take 10 mg by mouth daily with breakfast.   Yes Historical Provider, MD   BP 128/73  Pulse 94  Temp(Src) 98.7 F (37.1 C) (Oral)  Resp 19  SpO2 95%  LMP 01/12/2014 Physical Exam  Nursing note and vitals reviewed. Constitutional: She appears well-developed and well-nourished. No distress.  HENT:  Head: Normocephalic and atraumatic.  Eyes: Conjunctivae are normal.  Neck: Neck supple.  Cardiovascular: Normal rate and regular rhythm.  Pulmonary/Chest: Effort normal. No respiratory distress. She has decreased breath sounds. She has wheezes. She has no rhonchi. She has no rales.  Abdominal: Soft. She exhibits no distension. There is no tenderness. There is no rebound and no guarding.  Neurological: She is alert.  Skin: She is not diaphoretic.  Psychiatric: She has a normal mood and affect. Her behavior is normal.    ED Course  Procedures (including critical care time) Labs Review Labs Reviewed  BASIC METABOLIC PANEL - Abnormal; Notable for the following:    Potassium 3.6 (*)    Glucose, Bld 107 (*)    Creatinine, Ser 0.45 (*)    All other components within normal limits  CBC  TROPONIN I  PRO B  NATRIURETIC PEPTIDE  I-STAT TROPOININ, ED    Imaging Review Dg Chest 2 View  03/11/2014   CLINICAL DATA:  Chest pain and cough.  EXAM: CHEST - 2 VIEW  COMPARISON:  02/19/2002  FINDINGS: Lung volumes are very low with chronic elevation of the right hemidiaphragm. There is atelectasis versus infiltrate in the left upper lung. No overt edema or pleural fluid identified. The heart size and mediastinal contours are normal. The bony thorax is unremarkable.  IMPRESSION: Left upper lobe atelectasis versus infiltrate.   Electronically Signed   By: Irish Lack M.D.   On: 03/11/2014 16:19     EKG Interpretation   Date/Time:  Wednesday March 11 2014 15:08:12 EDT Ventricular Rate:  97 PR Interval:  128 QRS Duration: 88 QT Interval:  390 QTC Calculation: 495 R Axis:   49 Text Interpretation:  Normal sinus rhythm QTc 495 Confirmed by Fayrene Fearing  MD,  MARK ([id number]) on 03/11/2014 3:17:37 PM      10:47 PM Pt feeling and looking worse.  C/O severe chest pain despite several doses of pain medication.  Pt has been off of her prednisone one day and states her arthralgias usually come right back when she goes off the medications- solu-medrol ordered.  Pt also with hx ground glass opacities in LUL.  She has lupus, no hx blood clot.  Have added CT angio chest, troponin, BNP to workup.    Filed Vitals:   03/12/14 0000  BP: 120/74  Pulse: 111  Temp:   Resp:      MDM   Final diagnoses:  Community acquired pneumonia    Afebrile uncomfortable appearing patient with CP, SOB, cough and generalized body aches and arthralgias  - had abnormal xray concerning for pneumonia.  Pt given albuterol nebs with little relief.  Pt's O2 saturation declined through her visit and CT angio chest was ordered.  She also had CBC, BMP, troponin, BNP that were unremarkable.  EKG nonischemic, shows prolonged QT.  CT angio chest shows bilateral upper lobe infiltrates without PE.  Pt admitted to [facility] for observation  overnight. I spoke with Dr Lovell Sheehan.  Azithromycin and rocephin ordered.   Pt remains on O2 by [place].       [place], PA-C 03/12/14 [phone]

## 2014-03-12 DIAGNOSIS — R0902 Hypoxemia: Secondary | ICD-10-CM

## 2014-03-12 DIAGNOSIS — M329 Systemic lupus erythematosus, unspecified: Secondary | ICD-10-CM

## 2014-03-12 DIAGNOSIS — I10 Essential (primary) hypertension: Secondary | ICD-10-CM

## 2014-03-12 DIAGNOSIS — J189 Pneumonia, unspecified organism: Secondary | ICD-10-CM

## 2014-03-12 LAB — BASIC METABOLIC PANEL
ANION GAP: 15 (ref 5–15)
BUN: 9 mg/dL (ref 6–23)
CO2: 22 mEq/L (ref 19–32)
CREATININE: 0.38 mg/dL — AB (ref 0.50–1.10)
Calcium: 8.2 mg/dL — ABNORMAL LOW (ref 8.4–10.5)
Chloride: 103 mEq/L (ref 96–112)
Glucose, Bld: 145 mg/dL — ABNORMAL HIGH (ref 70–99)
Potassium: 3.9 mEq/L (ref 3.7–5.3)
Sodium: 140 mEq/L (ref 137–147)

## 2014-03-12 LAB — CBC
HCT: 42 % (ref 36.0–46.0)
Hemoglobin: 13.6 g/dL (ref 12.0–15.0)
MCH: 28.6 pg (ref 26.0–34.0)
MCHC: 32.4 g/dL (ref 30.0–36.0)
MCV: 88.4 fL (ref 78.0–100.0)
PLATELETS: 219 10*3/uL (ref 150–400)
RBC: 4.75 MIL/uL (ref 3.87–5.11)
RDW: 14.4 % (ref 11.5–15.5)
WBC: 4.8 10*3/uL (ref 4.0–10.5)

## 2014-03-12 MED ORDER — ONDANSETRON HCL 4 MG PO TABS: 4.0000 mg | ORAL_TABLET | Freq: Four times a day (QID) | ORAL | Status: DC | PRN

## 2014-03-12 MED ORDER — OXYCODONE HCL 5 MG PO TABS
5.0000 mg | ORAL_TABLET | ORAL | Status: DC | PRN
  Administered 2014-03-12: 5 mg via ORAL
  Filled 2014-03-12: qty 1

## 2014-03-12 MED ORDER — DEXTROSE 5 % IV SOLN
1.0000 g | Freq: Once | INTRAVENOUS | Status: AC
  Administered 2014-03-12: 1 g via INTRAVENOUS
  Filled 2014-03-12: qty 10

## 2014-03-12 MED ORDER — HYDROMORPHONE HCL 1 MG/ML IJ SOLN
0.5000 mg | INTRAMUSCULAR | Status: DC | PRN
  Administered 2014-03-12: 1 mg via INTRAVENOUS
  Filled 2014-03-12: qty 1

## 2014-03-12 MED ORDER — ALBUTEROL SULFATE (2.5 MG/3ML) 0.083% IN NEBU: 5.0000 mg | INHALATION_SOLUTION | RESPIRATORY_TRACT | Status: DC | PRN

## 2014-03-12 MED ORDER — ENOXAPARIN SODIUM 40 MG/0.4ML ~~LOC~~ SOLN
40.0000 mg | SUBCUTANEOUS | Status: DC
  Filled 2014-03-12: qty 0.4

## 2014-03-12 MED ORDER — AZATHIOPRINE 50 MG PO TABS
250.0000 mg | ORAL_TABLET | Freq: Every day | ORAL | Status: DC
  Filled 2014-03-12: qty 5

## 2014-03-12 MED ORDER — DEXTROSE 5 % IV SOLN: 500.0000 mg | INTRAVENOUS | Status: DC

## 2014-03-12 MED ORDER — ACETAMINOPHEN 650 MG RE SUPP: 650.0000 mg | Freq: Four times a day (QID) | RECTAL | Status: DC | PRN

## 2014-03-12 MED ORDER — ONDANSETRON HCL 4 MG/2ML IJ SOLN: 4.0000 mg | Freq: Four times a day (QID) | INTRAMUSCULAR | Status: DC | PRN

## 2014-03-12 MED ORDER — DEXTROSE 5 % IV SOLN: 1.0000 g | INTRAVENOUS | Status: DC

## 2014-03-12 MED ORDER — ALBUTEROL SULFATE (2.5 MG/3ML) 0.083% IN NEBU
2.5000 mg | INHALATION_SOLUTION | Freq: Four times a day (QID) | RESPIRATORY_TRACT | Status: DC
  Administered 2014-03-12: 2.5 mg via RESPIRATORY_TRACT
  Filled 2014-03-12 (×3): qty 3

## 2014-03-12 MED ORDER — FLUOXETINE HCL 20 MG PO CAPS
20.0000 mg | ORAL_CAPSULE | Freq: Every day | ORAL | Status: DC
  Filled 2014-03-12: qty 1

## 2014-03-12 MED ORDER — CELECOXIB 200 MG PO CAPS
200.0000 mg | ORAL_CAPSULE | Freq: Two times a day (BID) | ORAL | Status: DC
  Filled 2014-03-12 (×2): qty 1

## 2014-03-12 MED ORDER — SODIUM CHLORIDE 0.9 % IV SOLN
INTRAVENOUS | Status: DC
  Administered 2014-03-12: 02:00:00 via INTRAVENOUS

## 2014-03-12 MED ORDER — ACETAMINOPHEN 325 MG PO TABS: 650.0000 mg | ORAL_TABLET | Freq: Four times a day (QID) | ORAL | Status: DC | PRN

## 2014-03-12 MED ORDER — GABAPENTIN 300 MG PO CAPS
300.0000 mg | ORAL_CAPSULE | Freq: Three times a day (TID) | ORAL | Status: DC
  Filled 2014-03-12 (×3): qty 1

## 2014-03-12 MED ORDER — PREDNISONE 50 MG PO TABS
60.0000 mg | ORAL_TABLET | Freq: Every day | ORAL | Status: DC
  Filled 2014-03-12 (×2): qty 1

## 2014-03-12 MED ORDER — HYDROXYCHLOROQUINE SULFATE 200 MG PO TABS
200.0000 mg | ORAL_TABLET | Freq: Two times a day (BID) | ORAL | Status: DC
  Filled 2014-03-12 (×2): qty 1

## 2014-03-12 MED ORDER — ALUM & MAG HYDROXIDE-SIMETH 200-200-20 MG/5ML PO SUSP: 30.0000 mL | Freq: Four times a day (QID) | ORAL | Status: DC | PRN

## 2014-03-12 MED ORDER — DEXTROSE 5 % IV SOLN
500.0000 mg | Freq: Once | INTRAVENOUS | Status: AC
  Administered 2014-03-12: 500 mg via INTRAVENOUS
  Filled 2014-03-12: qty 500

## 2014-03-12 MED ORDER — ALBUTEROL SULFATE (2.5 MG/3ML) 0.083% IN NEBU
2.5000 mg | INHALATION_SOLUTION | RESPIRATORY_TRACT | Status: DC | PRN
Start: 2014-03-12 — End: 2014-03-12

## 2014-03-12 NOTE — Progress Notes (Signed)
Pt orientation to unit, room and routine. Information packet given to patient.  Admission INP armband ID verified with patient and in place. Side rails in place, fall risk assessment complete with patient verbalizing understanding of risks associated with falls. Pt verbalizes an understanding of how to use the call bell and to call for help before getting out of bed. Per pt request, assessment deferred until AM.   Will cont to monitor and assist as needed.  Gilman Schmidt, RN 03/12/2014 2:52 AM

## 2014-03-12 NOTE — H&P (Signed)
[facility] Admission History and Physical       Donna Hall KJI:[id number] DOB: Jul 03, 1985 DOA: 03/11/2014  Referring physician: EDP PCP: Donna Paula, MD  Specialists:   Chief Complaint: Cough and SOB  HPI: Donna Hall is a 28 y.o. female with a history of SLE, ILD, HTN who presents to the ED with complaints of increased SOB, Cough with chest congestion and chest tightness x 2 days.  She has been coughing up yellow mucus.  She denies having any fevers, but reports a poor appetite, and fatigue and malaise.  She had hypoxemia with O2 sats of 91% on RA on initial presentation, and was placed on 2 liters NCO2.   She was evaluated in the ED and was found to have a LUL Pneumonia on CTA of the Chest and was placed on IV Rocephin and Azithromycin and referred for medical admission.  The interview was conducted with Donna Lam RN who interpreted in spanish to assist the patient.      Review of Systems:  Constitutional: No Weight Loss, No Weight Gain, Night Sweats, Fevers, Chills, Dizziness, Fatigue, or Generalized Weakness HEENT: No Headaches, Difficulty Swallowing,Tooth/Dental Problems,Sore Throat,  No Sneezing, Rhinitis, Ear Ache, Nasal Congestion, or Post Nasal Drip,  Cardio-vascular:  No Chest pain, Orthopnea, PND, Edema in Lower Extremities, Anasarca, Dizziness, Palpitations  Resp: No +Dyspnea, No DOE, +Productive Cough, No Non-Productive Cough, No Hemoptysis, No Wheezing.    GI: No Heartburn, Indigestion, Abdominal Pain, Nausea, Vomiting, Diarrhea, Hematemesis, Hematochezia, Melena, Change in Bowel Habits,  Loss of Appetite  GU: No Dysuria, Change in Color of Urine, No Urgency or Frequency, No Flank pain.  Musculoskeletal: No Joint Pain or Swelling, No Decreased Range of Motion, No Back Pain.  Neurologic: No Syncope, No Seizures, Muscle Weakness, Paresthesia, Vision Disturbance or Loss, No Diplopia, No Vertigo, No Difficulty Walking,  Skin: No Rash or  Lesions. Psych: No Change in Mood or Affect, No Depression or Anxiety, No Memory loss, No Confusion, or Hallucinations   Past Medical History  Diagnosis Date  . Psoriasis 2010    as a child  . Chronic pain disorder   . Obesity   . Chronic steroid use 2010  . Bronchitis   . Lupus 2000  . Lupus (systemic lupus erythematosus) 2010  . Rheumatoid arthritis(714.0) 2010  . Hypertension 2010  . Gestational diabetes 2006    Past Surgical History  Procedure Laterality Date  . R thigh biopsy Right 2011     Prior to Admission medications   Medication Sig Start Date End Date Taking? Authorizing Provider  albuterol (PROVENTIL HFA;VENTOLIN HFA) 108 (90 BASE) MCG/ACT inhaler Inhale 2 puffs into the lungs every 4 (four) hours as needed for wheezing or shortness of breath.   Yes Historical Provider, MD  azaTHIOprine (IMURAN) 50 MG tablet Take 250 mg by mouth daily.   Yes Historical Provider, MD  celecoxib (CELEBREX) 200 MG capsule Take 200 mg by mouth 2 (two) times daily.   Yes Historical Provider, MD  FLUoxetine (PROZAC) 20 MG capsule Take 20 mg by mouth daily.   Yes Historical Provider, MD  gabapentin (NEURONTIN) 300 MG capsule Take 300 mg by mouth 3 (three) times daily.   Yes Historical Provider, MD  hydroxychloroquine (PLAQUENIL) 200 MG tablet Take 200 mg by mouth 2 (two) times daily.   Yes Historical Provider, MD  predniSONE (DELTASONE) 2.5 MG tablet Take 10 mg by mouth daily with breakfast.   Yes Historical Provider, MD    No Known  Allergies   Social History:  reports that she has never smoked. She has never used smokeless tobacco. She reports that she does not drink alcohol or use illicit drugs.     Family History  Problem Relation Age of Onset  . Diabetes Mother   . Hypertension Mother   . Cancer Neg Hx   . Early death Neg Hx   . Heart disease Neg Hx        Physical Exam:  GEN:  Pleasant Obese 28 y.o. Hispanic female examined  and in no acute distress; cooperative with  exam Filed Vitals:   03/12/14 0058 03/12/14 0100 03/12/14 0115 03/12/14 0213  BP:  108/68 106/62   Pulse: 105 108 107 106  Temp: 98 F (36.7 C)   98 F (36.7 C)  TempSrc:    Oral  Resp: 29 25 26 22   Height:    5\' 3"  (1.6 m)  Weight:    135.762 kg (299 lb 4.8 oz)  SpO2: 97% 97% 98% 96%   Blood pressure 106/62, pulse 106, temperature 98 F (36.7 C), temperature source Oral, resp. rate 22, height 5\' 3"  (1.6 m), weight 135.762 kg (299 lb 4.8 oz), last menstrual period 01/12/2014, SpO2 96.00%. PSYCH: She is alert and oriented x4; does not appear anxious does not appear depressed; affect is normal HEENT: Normocephalic and Atraumatic, Mucous membranes pink; PERRLA; EOM intact; Fundi:  Benign;  No scleral icterus, Nares: Patent, Oropharynx: Clear, Fair Dentition,    Neck:  FROM, No Cervical Lymphadenopathy nor Thyromegaly or Carotid Bruit; No JVD; Breasts:: Not examined CHEST WALL: No tenderness CHEST: Normal respiration, clear to auscultation bilaterally HEART: Regular rate and rhythm; no murmurs rubs or gallops BACK: No kyphosis or scoliosis; No CVA tenderness ABDOMEN: Positive Bowel Sounds, Obese, Soft Non-Tender; No Masses, No Organomegaly, No Pannus; No Intertriginous candida. Rectal Exam: Not done EXTREMITIES: No Cyanosis, Clubbing, Trace BLE Edema; No Ulcerations. Genitalia: not examined PULSES: 2+ and symmetric SKIN: Normal hydration no rash or ulceration CNS: Alert and Oriented X 4, No Focal Deficits Vascular: pulses palpable throughout    Labs on Admission:  Basic Metabolic Panel:  Recent Labs Lab 03/11/14 1513  NA 139  K 3.6*  CL 104  CO2 25  GLUCOSE 107*  BUN 13  CREATININE 0.45*  CALCIUM 8.5   Liver Function Tests: No results found for this basename: AST, ALT, ALKPHOS, BILITOT, PROT, ALBUMIN,  in the last 168 hours No results found for this basename: LIPASE, AMYLASE,  in the last 168 hours No results found for this basename: AMMONIA,  in the last 168  hours CBC:  Recent Labs Lab 03/11/14 1513  WBC 6.6  HGB 13.9  HCT 42.2  MCV 85.9  PLT 238   Cardiac Enzymes:  Recent Labs Lab 03/11/14 2252  TROPONINI <0.30    BNP (last 3 results)  Recent Labs  02/15/14 0424 02/19/14 1926 03/11/14 2252  PROBNP 26.4 18.3 25.2   CBG: No results found for this basename: GLUCAP,  in the last 168 hours  Radiological Exams on Admission: Dg Chest 2 View  03/11/2014   CLINICAL DATA:  Chest pain and cough.  EXAM: CHEST - 2 VIEW  COMPARISON:  02/19/2002  FINDINGS: Lung volumes are very low with chronic elevation of the right hemidiaphragm. There is atelectasis versus infiltrate in the left upper lung. No overt edema or pleural fluid identified. The heart size and mediastinal contours are normal. The bony thorax is unremarkable.  IMPRESSION: Left upper lobe atelectasis versus  infiltrate.   Electronically Signed   By: Irish Lack M.D.   On: 03/11/2014 16:19   Ct Angio Chest Pe W/cm &/or Wo Cm  03/12/2014   CLINICAL DATA:  Chest pain shortness of breath. History of lupus. Evaluate for pulmonary embolus.  EXAM: CT ANGIOGRAPHY CHEST WITH CONTRAST  TECHNIQUE: Multidetector CT imaging of the chest was performed using the standard protocol during bolus administration of intravenous contrast. Multiplanar CT image reconstructions and MIPs were obtained to evaluate the vascular anatomy.  CONTRAST:  OMNIPAQUE IOHEXOL 350 MG/ML SOLN  COMPARISON:  09/29/2013  FINDINGS: Heart: Heart is upper limits normal. No significant coronary artery calcification. No pericardial effusion.  Vascular structures: The pulmonary arteries are only moderately well opacified by contrast bolus. No obvious pulmonary embolus identified. Small pulmonary emboli may be difficult to exclude given the technical quality of the exam.  Mediastinum/thyroid: No mediastinal, hilar, or axillary adenopathy. The thyroid gland is not imaged.  Lungs/Airways: There is patchy density within the upper  lobes bilaterally, left greater than right. Small right upper lobe pulmonary nodule is 3 mm in diameter. Study is degraded by patient body habitus and motion.  Upper abdomen: Elevation of the right hemidiaphragm.  Chest wall/osseous structures: Unremarkable.  Review of the MIP images confirms the above findings.  IMPRESSION: 1. No evidence for pulmonary embolus. Small emboli may be difficult to exclude given the quality of the exam. 2. Patchy infiltrate involving the upper lobes bilaterally, left greater than right. Findings may represent mild edema, inflammation, or small airways disease. 3. Right upper lobe pulmonary nodule, 3 mm in diameter. Given stability over multiple prior studies, findings are felt to be benign.   Electronically Signed   By: Rosalie Gums M.D.   On: 03/12/2014 00:47     EKG: Independently reviewed.    Assessment/Plan:   28 y.o. female with  Principal Problem:   1.   CAP (community acquired pneumonia)   IV Rocephin and Azithromycin   Albuterol Nebs   O2 PRN    2.    Hypoxia   Monitor O2 Sats   O2 PRN    3.   INTERSTITIAL LUNG DISEASE   Albuterol Nebs   Oral Steroid Taper   O2 PRN     4.   Systemic lupus erythematosus   Continue Plaquenil, Azithioprine, and Prednisone RX      5.   Essential hypertension, benign     6.   DVT Prophylaxis        Code Status:  FULL CODE   Family Communication:   No Family at Bedside  Disposition Plan:     Observation  Time spent:  2 Minutes  Ron Parker [facility] Pager [phone]   If 7AM -7PM Please Contact the Day Rounding Team MD for [facility]  If 7PM-7AM, Please Contact night-coverage  [email or web address] Password [email or web address] 03/12/2014, 2:18 AM

## 2014-03-12 NOTE — ED Notes (Signed)
Pt stating she having difficulty breathing.  O2 sats noted to be 91% on RA.  Pt placed on 2L, currently sating 94%.  Wheezes auscultated in lungs.

## 2014-03-12 NOTE — Progress Notes (Signed)
Patient requested to leave against medical advance. Spanish speaking only. Interpreter used to provide information and education about the consequences of leaving. Per pt, "I need to go home because my husband has a doctor's appointment for diabetes and no one can take care of my son. My pastor is coming to pick me up." Patient stated, "I know my health is priority but no one can watch my son." Provided answers to patient's questions. AMA paper signed. Patient states she understands the risks.  IV site discontinued and catheter remains intact. Site without signs and symptoms of complications. Dressing and pressure applied.  Patient escorted to car by NT in a wheelchair,  no distress noted upon discharge.  Gilman Schmidt 03/12/2014 6:48 AM

## 2014-03-12 NOTE — ED Provider Notes (Signed)
Medical screening examination/treatment/procedure(s) were performed by non-physician practitioner and as supervising physician I was immediately available for consultation/collaboration.   Baldemar Dady L Queena Monrreal, MD 03/12/14 0815 

## 2014-03-16 ENCOUNTER — Telehealth: Payer: Self-pay | Admitting: Family Medicine

## 2014-03-16 DIAGNOSIS — I2699 Other pulmonary embolism without acute cor pulmonale: Secondary | ICD-10-CM

## 2014-03-16 NOTE — Telephone Encounter (Signed)
Called back to Dr. Jolene Provost. Patient is hospitalized a [facility] since 03/14/14 or SOB, Increased steroid dose and doing better. Call is regarding hx of PE in 10/2013 with plan for one year of anticoagulation to 10/2014.

## 2014-03-16 NOTE — Assessment & Plan Note (Signed)
A: 03/16/14 patient admitted at [facility], restated on xarelto for PE in 10/2013 with recommendations for one year of anticoagulation. P:  Plan to continue xarelto Work on obtaining xarelto through pass program.

## 2014-03-20 ENCOUNTER — Ambulatory Visit: Payer: Self-pay | Admitting: Family Medicine

## 2014-03-24 ENCOUNTER — Emergency Department (HOSPITAL_COMMUNITY): Payer: Self-pay

## 2014-03-24 ENCOUNTER — Emergency Department (HOSPITAL_COMMUNITY)
Admission: EM | Admit: 2014-03-24 | Discharge: 2014-03-24 | Disposition: A | Discharge status: Home / Self Care | Payer: Self-pay | Attending: Emergency Medicine | Admitting: Emergency Medicine

## 2014-03-24 ENCOUNTER — Encounter (HOSPITAL_COMMUNITY): Payer: Self-pay | Admitting: Emergency Medicine

## 2014-03-24 DIAGNOSIS — G8929 Other chronic pain: Secondary | ICD-10-CM | POA: Insufficient documentation

## 2014-03-24 DIAGNOSIS — IMO0002 Reserved for concepts with insufficient information to code with codable children: Secondary | ICD-10-CM | POA: Insufficient documentation

## 2014-03-24 DIAGNOSIS — Y9289 Other specified places as the place of occurrence of the external cause: Secondary | ICD-10-CM | POA: Insufficient documentation

## 2014-03-24 DIAGNOSIS — Z9181 History of falling: Secondary | ICD-10-CM | POA: Insufficient documentation

## 2014-03-24 DIAGNOSIS — Z8632 Personal history of gestational diabetes: Secondary | ICD-10-CM | POA: Insufficient documentation

## 2014-03-24 DIAGNOSIS — S99929A Unspecified injury of unspecified foot, initial encounter: Principal | ICD-10-CM

## 2014-03-24 DIAGNOSIS — Z791 Long term (current) use of non-steroidal anti-inflammatories (NSAID): Secondary | ICD-10-CM | POA: Insufficient documentation

## 2014-03-24 DIAGNOSIS — M069 Rheumatoid arthritis, unspecified: Secondary | ICD-10-CM | POA: Insufficient documentation

## 2014-03-24 DIAGNOSIS — Z79899 Other long term (current) drug therapy: Secondary | ICD-10-CM | POA: Insufficient documentation

## 2014-03-24 DIAGNOSIS — Z872 Personal history of diseases of the skin and subcutaneous tissue: Secondary | ICD-10-CM | POA: Insufficient documentation

## 2014-03-24 DIAGNOSIS — S99919A Unspecified injury of unspecified ankle, initial encounter: Principal | ICD-10-CM

## 2014-03-24 DIAGNOSIS — R000 Tachycardia, unspecified: Secondary | ICD-10-CM | POA: Insufficient documentation

## 2014-03-24 DIAGNOSIS — M25561 Pain in right knee: Secondary | ICD-10-CM

## 2014-03-24 DIAGNOSIS — X500XXA Overexertion from strenuous movement or load, initial encounter: Secondary | ICD-10-CM | POA: Insufficient documentation

## 2014-03-24 DIAGNOSIS — E669 Obesity, unspecified: Secondary | ICD-10-CM | POA: Insufficient documentation

## 2014-03-24 DIAGNOSIS — I10 Essential (primary) hypertension: Secondary | ICD-10-CM | POA: Insufficient documentation

## 2014-03-24 DIAGNOSIS — Y9389 Activity, other specified: Secondary | ICD-10-CM | POA: Insufficient documentation

## 2014-03-24 DIAGNOSIS — Z8709 Personal history of other diseases of the respiratory system: Secondary | ICD-10-CM | POA: Insufficient documentation

## 2014-03-24 DIAGNOSIS — S8990XA Unspecified injury of unspecified lower leg, initial encounter: Secondary | ICD-10-CM | POA: Insufficient documentation

## 2014-03-24 MED ORDER — IBUPROFEN 800 MG PO TABS: 800.0000 mg | ORAL_TABLET | Freq: Three times a day (TID) | ORAL | Status: DC

## 2014-03-24 MED ORDER — HYDROCODONE-ACETAMINOPHEN 5-325 MG PO TABS
1.0000 | ORAL_TABLET | Freq: Once | ORAL | Status: AC
  Administered 2014-03-24: 1 via ORAL
  Filled 2014-03-24: qty 1

## 2014-03-24 NOTE — Discharge Instructions (Signed)
1. Medications: ibuprofen with food, usual home medications 2. Treatment: rest, drink plenty of fluids, use ACE wrap, elevate, ice 3. Follow Up: Please followup with your primary doctor for discussion in 3 days of your diagnoses and further evaluation after today's visit;    Knee Pain The knee is the complex joint between your thigh and your lower leg. It is made up of bones, tendons, ligaments, and cartilage. The bones that make up the knee are:  The femur in the thigh.  The tibia and fibula in the lower leg.  The patella or kneecap riding in the groove on the lower femur. CAUSES  Knee pain is a common complaint with many causes. A few of these causes are:  Injury, such as:  A ruptured ligament or tendon injury.  Torn cartilage.  Medical conditions, such as:  Gout  Arthritis  Infections  Overuse, over training, or overdoing a physical activity. Knee pain can be minor or severe. Knee pain can accompany debilitating injury. Minor knee problems often respond well to self-care measures or get well on their own. More serious injuries may need medical intervention or even surgery. SYMPTOMS The knee is complex. Symptoms of knee problems can vary widely. Some of the problems are:  Pain with movement and weight bearing.  Swelling and tenderness.  Buckling of the knee.  Inability to straighten or extend your knee.  Your knee locks and you cannot straighten it.  Warmth and redness with pain and fever.  Deformity or dislocation of the kneecap. DIAGNOSIS  Determining what is wrong may be very straight forward such as when there is an injury. It can also be challenging because of the complexity of the knee. Tests to make a diagnosis may include:  Your caregiver taking a history and doing a physical exam.  Routine X-rays can be used to rule out other problems. X-rays will not reveal a cartilage tear. Some injuries of the knee can be diagnosed by:  Arthroscopy a surgical  technique by which a small video camera is inserted through tiny incisions on the sides of the knee. This procedure is used to examine and repair internal knee joint problems. Tiny instruments can be used during arthroscopy to repair the torn knee cartilage (meniscus).  Arthrography is a radiology technique. A contrast liquid is directly injected into the knee joint. Internal structures of the knee joint then become visible on X-ray film.  An MRI scan is a non X-ray radiology procedure in which magnetic fields and a computer produce two- or three-dimensional images of the inside of the knee. Cartilage tears are often visible using an MRI scanner. MRI scans have largely replaced arthrography in diagnosing cartilage tears of the knee.  Blood work.  Examination of the fluid that helps to lubricate the knee joint (synovial fluid). This is done by taking a sample out using a needle and a syringe. TREATMENT The treatment of knee problems depends on the cause. Some of these treatments are:  Depending on the injury, proper casting, splinting, surgery, or physical therapy care will be needed.  Give yourself adequate recovery time. Do not overuse your joints. If you begin to get sore during workout routines, back off. Slow down or do fewer repetitions.  For repetitive activities such as cycling or running, maintain your strength and nutrition.  Alternate muscle groups. For example, if you are a weight lifter, work the upper body on one day and the lower body the next.  Either tight or weak muscles do not  give the proper support for your knee. Tight or weak muscles do not absorb the stress placed on the knee joint. Keep the muscles surrounding the knee strong.  Take care of mechanical problems.  If you have flat feet, orthotics or special shoes may help. See your caregiver if you need help.  Arch supports, sometimes with wedges on the inner or outer aspect of the heel, can help. These can shift  pressure away from the side of the knee most bothered by osteoarthritis.  A brace called an "unloader" brace also may be used to help ease the pressure on the most arthritic side of the knee.  If your caregiver has prescribed crutches, braces, wraps or ice, use as directed. The acronym for this is PRICE. This means protection, rest, ice, compression, and elevation.  Nonsteroidal anti-inflammatory drugs (NSAIDs), can help relieve pain. But if taken immediately after an injury, they may actually increase swelling. Take NSAIDs with food in your stomach. Stop them if you develop stomach problems. Do not take these if you have a history of ulcers, stomach pain, or bleeding from the bowel. Do not take without your caregiver's approval if you have problems with fluid retention, heart failure, or kidney problems.  For ongoing knee problems, physical therapy may be helpful.  Glucosamine and chondroitin are over-the-counter dietary supplements. Both may help relieve the pain of osteoarthritis in the knee. These medicines are different from the usual anti-inflammatory drugs. Glucosamine may decrease the rate of cartilage destruction.  Injections of a corticosteroid drug into your knee joint may help reduce the symptoms of an arthritis flare-up. They may provide pain relief that lasts a few months. You may have to wait a few months between injections. The injections do have a small increased risk of infection, water retention, and elevated blood sugar levels.  Hyaluronic acid injected into damaged joints may ease pain and provide lubrication. These injections may work by reducing inflammation. A series of shots may give relief for as long as 6 months.  Topical painkillers. Applying certain ointments to your skin may help relieve the pain and stiffness of osteoarthritis. Ask your pharmacist for suggestions. Many over the-counter products are approved for temporary relief of arthritis pain.  In some countries,  doctors often prescribe topical NSAIDs for relief of chronic conditions such as arthritis and tendinitis. A review of treatment with NSAID creams found that they worked as well as oral medications but without the serious side effects. PREVENTION  Maintain a healthy weight. Extra pounds put more strain on your joints.  Get strong, stay limber. Weak muscles are a common cause of knee injuries. Stretching is important. Include flexibility exercises in your workouts.  Be smart about exercise. If you have osteoarthritis, chronic knee pain or recurring injuries, you may need to change the way you exercise. This does not mean you have to stop being active. If your knees ache after jogging or playing basketball, consider switching to swimming, water aerobics, or other low-impact activities, at least for a few days a week. Sometimes limiting high-impact activities will provide relief.  Make sure your shoes fit well. Choose footwear that is right for your sport.  Protect your knees. Use the proper gear for knee-sensitive activities. Use kneepads when playing volleyball or laying carpet. Buckle your seat belt every time you drive. Most shattered kneecaps occur in car accidents.  Rest when you are tired. SEEK MEDICAL CARE IF:  You have knee pain that is continual and does not seem to be  getting better.  °SEEK IMMEDIATE MEDICAL CARE IF:  °Your knee joint feels hot to the touch and you have a high fever. °MAKE SURE YOU:  °· Understand these instructions. °· Will watch your condition. °· Will get help right away if you are not doing well or get worse. °Document Released: 04/09/2007 Document Revised: 09/04/2011 Document Reviewed: 04/09/2007 °Exit[employer]® Patient Information ©2015 [employer]. This information is not intended to replace advice given to you by your health care provider. Make sure you discuss any questions you have with your health care provider. ° °

## 2014-03-24 NOTE — ED Notes (Signed)
Patient states mechanical fall this morning.   Patient states now having R knee pain.

## 2014-03-24 NOTE — ED Provider Notes (Signed)
CSN: [id number]     Arrival date & time 03/24/14  1120 History  This chart was scribed for non-physician practitioner, Dierdre Forth, PA-C working with Purvis Sheffield, MD by Luisa Dago, ED scribe. This patient was seen in room TR04C/TR04C and the patient's care was started at 4:30 PM.      Chief Complaint  Patient presents with  . Fall   The history is provided by the patient and medical records. A language interpreter was used.   HPI Comments: Donna Hall is a 28 y.o. female who presents to the Emergency Department complaining of a fall that occurred this AM. Pt states that she was sweeping the floor when she fell backwards and inverted her right knee. She is currently complaining of worsening right knee pain. Pt states that the pain is worsened by movement, specifically with the bending of the effected knee. She states that this is the second occurrence, pt states that in the past she fell and injured the same knee. She endorses initial associated paraspinal neck pain that has improved. Denies any numbness, tingling, back pain, or visual disturbances.  Past Medical History  Diagnosis Date  . Psoriasis 2010    as a child  . Chronic pain disorder   . Obesity   . Chronic steroid use 2010  . Bronchitis   . Lupus 2000  . Lupus (systemic lupus erythematosus) 2010  . Rheumatoid arthritis(714.0) 2010  . Hypertension 2010  . Gestational diabetes 2006   Past Surgical History  Procedure Laterality Date  . R thigh biopsy Right 2011   Family History  Problem Relation Age of Onset  . Diabetes Mother   . Hypertension Mother   . Cancer Neg Hx   . Early death Neg Hx   . Heart disease Neg Hx    History  Substance Use Topics  . Smoking status: Never Smoker   . Smokeless tobacco: Never Used  . Alcohol Use: No   OB History   Grav Para Term Preterm Abortions TAB SAB Ect Mult Living                 Review of Systems  Constitutional: Negative for fever and chills.   Gastrointestinal: Negative for nausea and vomiting.  Musculoskeletal: Positive for arthralgias and joint swelling. Negative for back pain, neck pain and neck stiffness.  Skin: Negative for wound.  Neurological: Negative for dizziness, weakness, numbness and headaches.  Hematological: Does not bruise/bleed easily.  Psychiatric/Behavioral: The patient is not nervous/anxious.   All other systems reviewed and are negative.  Allergies  Review of patient's allergies indicates no known allergies.  Home Medications   Prior to Admission medications   Medication Sig Start Date End Date Taking? Authorizing Provider  albuterol (PROVENTIL HFA;VENTOLIN HFA) 108 (90 BASE) MCG/ACT inhaler Inhale 2 puffs into the lungs every 4 (four) hours as needed for wheezing or shortness of breath.    Historical Provider, MD  azaTHIOprine (IMURAN) 50 MG tablet Take 250 mg by mouth daily.    Historical Provider, MD  celecoxib (CELEBREX) 200 MG capsule Take 200 mg by mouth 2 (two) times daily.    Historical Provider, MD  FLUoxetine (PROZAC) 20 MG capsule Take 20 mg by mouth daily.    Historical Provider, MD  gabapentin (NEURONTIN) 300 MG capsule Take 300 mg by mouth 3 (three) times daily.    Historical Provider, MD  hydroxychloroquine (PLAQUENIL) 200 MG tablet Take 200 mg by mouth 2 (two) times daily.    Historical Provider,  MD  ibuprofen (ADVIL,MOTRIN) 800 MG tablet Take 1 tablet (800 mg total) by mouth 3 (three) times daily. with food 03/24/14   Dahlia Client Marten Iles, PA-C  predniSONE (DELTASONE) 2.5 MG tablet Take 10 mg by mouth daily with breakfast.    Historical Provider, MD   Triage vitals: BP 117/67  Pulse 117  Temp(Src) 98.1 F (36.7 C) (Oral)  Resp 19  Ht 5\' 3"  (1.6 m)  Wt 200 lb (90.719 kg)  BMI 35.44 kg/m2  SpO2 93%  LMP 03/24/2014  Physical Exam  Nursing note and vitals reviewed. Constitutional: She appears well-developed and well-nourished. No distress.  HENT:  Head: Normocephalic and atraumatic.   Eyes: Conjunctivae are normal.  Neck: Normal range of motion.  Full ROM without pain No midline or paraspinal tenderness  Cardiovascular: Regular rhythm, normal heart sounds and intact distal pulses.   No murmur heard. Capillary refill less than 3 sec. Mild tachycardia  Pulmonary/Chest: Effort normal and breath sounds normal.  Musculoskeletal: She exhibits tenderness. She exhibits no edema.  ROM: FROM of the right knee with pain. No ecchymosis or deformity. No abnormal patella movement. Tenderness to palpation along the lateral joint line. No tenderness along the medial joint line. Pt is ambulatory with moderate pain to the right knee.   Neurological: She is alert. She exhibits normal muscle tone. Coordination normal.  Sensation intact to dull and sharp. Strength 5/5 in the right lower extremity including dorsiflexion and plantarflexion of the right foot in addition to extension and flexion of the right knee.  Skin: Skin is warm and dry. She is not diaphoretic. No erythema.  No tenting of the skin  Psychiatric: She has a normal mood and affect.    ED Course  Procedures (including critical care time)  DIAGNOSTIC STUDIES: Oxygen Saturation is 93% on RA, low by my interpretation.    COORDINATION OF CARE: 4:41 PM- pt was advised of the results of her X-ray. Will d/c pt home with antiinflammatories. Pt advised of plan for treatment and pt agrees.  Imaging Review Dg Knee Complete 4 Views Right  03/24/2014   CLINICAL DATA:  Status post fall with right knee pain  EXAM: RIGHT KNEE - COMPLETE 4+ VIEW  COMPARISON:  None.  FINDINGS: The bones are adequately mineralized. There is no acute fracture nor dislocation. There is no joint effusion. Mild narrowing of the lateral joint compartment is suspected. There is beaking of the tibial spines. No erosive changes are demonstrated.  IMPRESSION: There is no acute bony abnormality of the right knee. Very mild degenerative changes are suspected.    Electronically Signed   By: David  03/26/2014   On: 03/24/2014 16:13   MDM   Final diagnoses:  Right knee pain   03/26/2014 [place]l presents with acute on chronic right knee pain after a fall today.  Patient X-Ray negative for obvious fracture or dislocation. Pain managed in ED. Pt advised to follow up with orthopedics if symptoms persist for further evaluation and treatment. Patient given brace while in ED, conservative therapy recommended and discussed. Patient will be dc home & is agreeable with above plan.  I personally reviewed the imaging tests through PACS system  I reviewed available ER/hospitalization records through the EMR  Pt with tachycardia on arrival; likely 2/2 pain.  Mild tachycardia persists, but patient continues to c/o mild pain.    BP 122/79  Pulse 109  Temp(Src) 98.1 F (36.7 C) (Oral)  Resp 16  Ht 5\' 3"  (1.6 m)  Wt 200 lb (  90.719 kg)  BMI 35.44 kg/m2  SpO2 96%  LMP 03/24/2014   I personally performed the services described in this documentation, which was scribed in my presence. The recorded information has been reviewed and is accurate.    Dahlia Client Kenise Barraco, PA-C 03/25/14 [phone]

## 2014-03-25 NOTE — ED Provider Notes (Signed)
Medical screening examination/treatment/procedure(s) were performed by non-physician practitioner and as supervising physician I was immediately available for consultation/collaboration.   EKG Interpretation None        Purvis Sheffield, MD 03/25/14 1818

## 2014-04-02 ENCOUNTER — Ambulatory Visit: Payer: Self-pay | Attending: Family Medicine | Admitting: Family Medicine

## 2014-04-02 ENCOUNTER — Encounter: Payer: Self-pay | Admitting: Family Medicine

## 2014-04-02 VITALS — BP 110/75 | HR 103 | Temp 99.0°F | Resp 18 | Ht 63.0 in | Wt 291.0 lb

## 2014-04-02 DIAGNOSIS — L409 Psoriasis, unspecified: Secondary | ICD-10-CM | POA: Insufficient documentation

## 2014-04-02 DIAGNOSIS — J189 Pneumonia, unspecified organism: Secondary | ICD-10-CM

## 2014-04-02 DIAGNOSIS — M25561 Pain in right knee: Secondary | ICD-10-CM | POA: Insufficient documentation

## 2014-04-02 DIAGNOSIS — I2699 Other pulmonary embolism without acute cor pulmonale: Secondary | ICD-10-CM

## 2014-04-02 DIAGNOSIS — Z7901 Long term (current) use of anticoagulants: Secondary | ICD-10-CM | POA: Insufficient documentation

## 2014-04-02 LAB — POCT INR: INR: 1

## 2014-04-02 MED ORDER — MOMETASONE FUROATE 0.1 % EX CREA: 1.0000 "application " | TOPICAL_CREAM | Freq: Every day | CUTANEOUS | Status: DC

## 2014-04-02 MED ORDER — TRAMADOL HCL 50 MG PO TABS: 50.0000 mg | ORAL_TABLET | Freq: Three times a day (TID) | ORAL | Status: DC | PRN

## 2014-04-02 MED ORDER — METHYLPREDNISOLONE ACETATE 40 MG/ML IJ SUSP
40.0000 mg | Freq: Once | INTRAMUSCULAR | Status: AC
  Administered 2014-04-02: 40 mg via INTRA_ARTICULAR

## 2014-04-02 MED ORDER — CELECOXIB 200 MG PO CAPS: 200.0000 mg | ORAL_CAPSULE | Freq: Two times a day (BID) | ORAL | Status: DC

## 2014-04-02 MED ORDER — RIVAROXABAN 20 MG PO TABS: 20.0000 mg | ORAL_TABLET | Freq: Every day | ORAL | Status: DC

## 2014-04-02 MED ORDER — CLOBETASOL PROPIONATE 0.05 % EX SOLN: 1.0000 "application " | Freq: Every day | CUTANEOUS | Status: DC

## 2014-04-02 NOTE — Assessment & Plan Note (Signed)
A: persistent pain and decreased ROM of R knee following fall. I am concerned for meniscal injury P: Knee injection for pain control Tramadol prn  MRI of R knee

## 2014-04-02 NOTE — Patient Instructions (Signed)
Mrs. [name],,  Bonita Quin will be called with MRI appt for your knee.  Rest and ice knee tonight. Call for worsening pain, swelling, knee redness.  F/u in 3 weeks   Dr. Armen Pickup

## 2014-04-02 NOTE — Assessment & Plan Note (Signed)
A: resolved P: F.u CXR 8 weeks from last to evaluate resolution of infiltrate

## 2014-04-02 NOTE — Progress Notes (Signed)
   Subjective:    Patient ID: Donna Hall, female    DOB: 13-Mar-1986, 28 y.o.   MRN: [id number] CC: HFU R knee injury  HPI 28 yo presents for f/u visit:   1. R knee pain: persistent pain with locking and popping following fall while sweeping on 03/24/14. Patient went to ED. Negative x-rays. Prescribed NSAID. Still having pain. Pain is diffuse anterior and posterior. Going down to calf.   2. Psoriasis: has itching on back and in scalp with scalp flaking. Still taking daily oral steroid.   3. PE: dx with PE at [facility] in 10/2013. Was at [facility] last month and restarted on xarelto. Taking xarelto daily. Does not qualify for PASS program. Also taking celebrex. Has low health literacy and history of non compliance in the past.   Soc hx: non smoker   Review of Systems As per HPI  Reports HA and SOB    Objective:   Physical Exam BP 110/75  Pulse 103  Temp(Src) 99 F (37.2 C) (Oral)  Resp 18  Ht 5\' 3"  (1.6 m)  Wt 291 lb (131.997 kg)  BMI 51.56 kg/m2  SpO2 97%  LMP 03/24/2014 General appearance: alert, cooperative and no distress Skin/scalp: scaly plaques on back. Flaking scalp. Scaly plaque behind R ear.  Extremities: no LE edema R knee: no effusion, decreased ROM, bruising R upper calf    After obtaining informed consent and cleaning the skin using iodine and alcohol a  steroid injection was performed at R knee using 1% plain Lidocaine and 40 mg of Depo Medrol.  This was well tolerated      Assessment & Plan:

## 2014-04-02 NOTE — Assessment & Plan Note (Signed)
A: active plaques on back and in scalp P: Clobetasol solution for scalp Mometasone cream for back

## 2014-04-02 NOTE — Progress Notes (Signed)
HFU due to knee injury

## 2014-04-02 NOTE — Assessment & Plan Note (Signed)
A: on xarelto. Too high risk to transition to coumadin P: Continue xarelto 20 will provide patient with samples

## 2014-04-09 ENCOUNTER — Telehealth: Payer: Self-pay | Admitting: Family Medicine

## 2014-04-09 NOTE — Telephone Encounter (Signed)
Patient needs refill for prednisone 2.5 mg. Please follow up with Patient. Patient ONLY speaks Spanish.

## 2014-04-09 NOTE — Telephone Encounter (Signed)
Returned Pt's call, pt need Rx Prednisone.

## 2014-04-10 ENCOUNTER — Telehealth: Payer: Self-pay | Admitting: Family Medicine

## 2014-04-10 NOTE — Telephone Encounter (Signed)
Patient needs prednisone 2.5 mg ASAP. Please follow up with Patient who is at the [facility]

## 2014-04-14 MED ORDER — PREDNISONE 10 MG PO TABS: 10.0000 mg | ORAL_TABLET | Freq: Every day | ORAL | Status: DC

## 2014-04-14 NOTE — Addendum Note (Signed)
Addended by: Dessa Phi on: 04/14/2014 12:46 PM   Modules accepted: Orders

## 2014-04-14 NOTE — Telephone Encounter (Signed)
Refilled prednisone 10mg daily.

## 2014-04-14 NOTE — Assessment & Plan Note (Signed)
Refilled prednisone 10mg daily.

## 2014-04-28 ENCOUNTER — Ambulatory Visit: Payer: Self-pay | Attending: Family Medicine

## 2014-04-28 ENCOUNTER — Telehealth: Payer: Self-pay | Admitting: Family Medicine

## 2014-04-28 ENCOUNTER — Other Ambulatory Visit: Payer: Self-pay | Admitting: Family Medicine

## 2014-04-28 VITALS — BP 114/86 | HR 111 | Temp 98.1°F | Resp 20 | Ht 62.0 in | Wt 298.0 lb

## 2014-04-28 DIAGNOSIS — M25561 Pain in right knee: Secondary | ICD-10-CM

## 2014-04-28 MED ORDER — AZATHIOPRINE 50 MG PO TABS: 250.0000 mg | ORAL_TABLET | Freq: Every day | ORAL | Status: DC

## 2014-04-28 MED ORDER — PREDNISONE 10 MG PO TABS: ORAL_TABLET | ORAL | Status: DC

## 2014-04-28 MED ORDER — TRAMADOL HCL 50 MG PO TABS: 50.0000 mg | ORAL_TABLET | Freq: Three times a day (TID) | ORAL | Status: DC | PRN

## 2014-04-28 NOTE — Patient Instructions (Signed)
Take steroid medications until completed and return for follow up appointment

## 2014-04-28 NOTE — Progress Notes (Unsigned)
Pt comes in with c/o increased SOB x 4 dys with cough and lower extrem swelling Sats 93-95% with deep breathing Lungs diminished ,clear on auscultation C/o slight headache with diff swallowing foods/saliva No distress noted Afebrile.  Spanish interpretor present

## 2014-04-28 NOTE — Telephone Encounter (Signed)
Pt. Came into facility to request a refill for azaTHIOprine (IMURAN) 50 MG tablet. Please f/u with pt.

## 2014-05-04 ENCOUNTER — Ambulatory Visit: Payer: Self-pay | Admitting: Family Medicine

## 2014-05-04 ENCOUNTER — Ambulatory Visit (HOSPITAL_BASED_OUTPATIENT_CLINIC_OR_DEPARTMENT_OTHER): Payer: MEDICAID | Admitting: *Deleted

## 2014-05-04 DIAGNOSIS — Z23 Encounter for immunization: Secondary | ICD-10-CM

## 2014-05-07 ENCOUNTER — Telehealth: Payer: Self-pay | Admitting: Family Medicine

## 2014-05-07 DIAGNOSIS — M25561 Pain in right knee: Secondary | ICD-10-CM

## 2014-05-07 NOTE — Telephone Encounter (Signed)
Pt is requesting a refill on her betamethasone 45mg  for her soriasis. She received this medication when she was in the hospital. Please follow up with patient to advise on what she can do. She is also requesting medication refills on her pain medication.

## 2014-05-08 MED ORDER — TRAMADOL HCL 50 MG PO TABS: 50.0000 mg | ORAL_TABLET | Freq: Three times a day (TID) | ORAL | Status: DC | PRN

## 2014-05-08 MED ORDER — CELECOXIB 200 MG PO CAPS: 200.0000 mg | ORAL_CAPSULE | Freq: Two times a day (BID) | ORAL | Status: DC

## 2014-05-08 NOTE — Telephone Encounter (Signed)
Expand All Collapse All   Pt is requesting a refill on her betamethasone 45mg  for her soriasis. She received this medication when she was in the hospital. Please follow up with patient to advise on what she can do. She is also requesting medication refills on her pain medication.       Please f/u

## 2014-05-08 NOTE — Telephone Encounter (Signed)
Celebrex sent in to onsite pharmacy. Tramadol printed and placed up front for pick up.  Need more info about betamethasone for psoriasis, is this a cream, ointment, solution?  Please call patient and let me know.

## 2014-05-14 ENCOUNTER — Encounter: Payer: Self-pay | Admitting: Family Medicine

## 2014-05-14 ENCOUNTER — Ambulatory Visit: Payer: Self-pay | Attending: Family Medicine | Admitting: Family Medicine

## 2014-05-14 VITALS — BP 107/74 | HR 100 | Temp 98.8°F | Resp 18 | Ht 62.0 in | Wt 296.0 lb

## 2014-05-14 DIAGNOSIS — M25561 Pain in right knee: Secondary | ICD-10-CM

## 2014-05-14 DIAGNOSIS — Z8701 Personal history of pneumonia (recurrent): Secondary | ICD-10-CM | POA: Insufficient documentation

## 2014-05-14 DIAGNOSIS — F418 Other specified anxiety disorders: Secondary | ICD-10-CM

## 2014-05-14 DIAGNOSIS — F32A Depression, unspecified: Secondary | ICD-10-CM | POA: Insufficient documentation

## 2014-05-14 DIAGNOSIS — L409 Psoriasis, unspecified: Secondary | ICD-10-CM | POA: Insufficient documentation

## 2014-05-14 DIAGNOSIS — F329 Major depressive disorder, single episode, unspecified: Secondary | ICD-10-CM | POA: Insufficient documentation

## 2014-05-14 DIAGNOSIS — F419 Anxiety disorder, unspecified: Secondary | ICD-10-CM | POA: Insufficient documentation

## 2014-05-14 DIAGNOSIS — R05 Cough: Secondary | ICD-10-CM | POA: Insufficient documentation

## 2014-05-14 DIAGNOSIS — R509 Fever, unspecified: Secondary | ICD-10-CM | POA: Insufficient documentation

## 2014-05-14 DIAGNOSIS — M329 Systemic lupus erythematosus, unspecified: Secondary | ICD-10-CM

## 2014-05-14 DIAGNOSIS — J189 Pneumonia, unspecified organism: Secondary | ICD-10-CM

## 2014-05-14 DIAGNOSIS — R0602 Shortness of breath: Secondary | ICD-10-CM | POA: Insufficient documentation

## 2014-05-14 MED ORDER — AZITHROMYCIN 500 MG PO TABS: 500.0000 mg | ORAL_TABLET | Freq: Every day | ORAL | Status: DC

## 2014-05-14 MED ORDER — HYDROXYZINE HCL 10 MG PO TABS: 10.0000 mg | ORAL_TABLET | Freq: Three times a day (TID) | ORAL | Status: DC | PRN

## 2014-05-14 MED ORDER — CELECOXIB 200 MG PO CAPS: 200.0000 mg | ORAL_CAPSULE | Freq: Two times a day (BID) | ORAL | Status: DC

## 2014-05-14 MED ORDER — DULOXETINE HCL 20 MG PO CPEP: 20.0000 mg | ORAL_CAPSULE | Freq: Every day | ORAL | Status: DC

## 2014-05-14 MED ORDER — ALBUTEROL SULFATE HFA 108 (90 BASE) MCG/ACT IN AERS: 2.0000 | INHALATION_SPRAY | RESPIRATORY_TRACT | Status: DC | PRN

## 2014-05-14 MED ORDER — TRAMADOL HCL 50 MG PO TABS: 50.0000 mg | ORAL_TABLET | Freq: Three times a day (TID) | ORAL | Status: DC | PRN

## 2014-05-14 MED ORDER — BETAMETHASONE DIPROPIONATE 0.05 % EX CREA: TOPICAL_CREAM | Freq: Two times a day (BID) | CUTANEOUS | Status: DC

## 2014-05-14 MED ORDER — HYDROXYCHLOROQUINE SULFATE 200 MG PO TABS: 200.0000 mg | ORAL_TABLET | Freq: Two times a day (BID) | ORAL | Status: DC

## 2014-05-14 NOTE — Assessment & Plan Note (Signed)
A: active  P: Ordered betamethasone

## 2014-05-14 NOTE — Progress Notes (Signed)
   Subjective:    Patient ID: Donna Hall, female    DOB: Jan 15, 1986, 28 y.o.   MRN: [id number] CC: f/u, medication refill  HPI 28 yo F presents for f/u visit:  1. SOB: x 5 days. Out of albuterol. Having subjective fever 2 nights ago. No sick contacts. No worsening body aches.  2. Psoriasis: prescribed betamethasone cream after last hospital discharge, requesting refill.   Soc hx; non smoker  Review of Systems As per HPI      Objective:   Physical Exam BP 107/74 mmHg  Pulse 100  Temp(Src) 98.8 F (37.1 C) (Oral)  Resp 18  Ht 5\' 2"  (1.575 m)  Wt 296 lb (134.265 kg)  BMI 54.13 kg/m2  SpO2 96%  LMP 04/28/2014  Wt Readings from Last 3 Encounters:  05/14/14 296 lb (134.265 kg)  04/28/14 298 lb (135.172 kg)  04/02/14 291 lb (131.997 kg)  General appearance: alert, cooperative and no distress Lungs: slight crackles on L lung base  Heart: regular rate and rhythm, S1, S2 normal, no murmur, click, rub or gallop Skin: multiple macules and areas of excoriation   Albuterol treatment x one      Assessment & Plan:

## 2014-05-14 NOTE — Assessment & Plan Note (Signed)
A: concerned about recurrence given SOB, subjective fever P: CXR Azithromycin 500 mg daily x 5 days

## 2014-05-14 NOTE — Assessment & Plan Note (Signed)
Start cymbalta, titrate up as tolerated

## 2014-05-14 NOTE — Addendum Note (Signed)
Addended by: Dessa Phi on: 05/14/2014 06:11 PM   Modules accepted: Orders

## 2014-05-14 NOTE — Progress Notes (Signed)
F/U Lupus Medicine Refills

## 2014-05-14 NOTE — Patient Instructions (Signed)
Mrs. Donna Hall,  Thank you for coming in today.  1. Skin rash: Betamethasone ordered, will come in tomorrow. Hydroxyzine 10 mg 3 times daily as needed for itching.  2. Cough: Take antibiotic 500 mg daily for 5 days. Go to [facility] for chest x-ray. Use albuterol as needed    3. Pain: tramadol refilled.   4. Depression and anxiety: start cymbalta.  F/u in 6 weeks  Dr. Armen Pickup

## 2014-05-15 ENCOUNTER — Encounter (HOSPITAL_COMMUNITY): Payer: Self-pay | Admitting: Emergency Medicine

## 2014-05-15 ENCOUNTER — Inpatient Hospital Stay (HOSPITAL_COMMUNITY)
Admission: EM | Admit: 2014-05-15 | Discharge: 2014-05-17 | DRG: 194 | Disposition: A | Discharge status: Home / Self Care | Payer: Medicaid Other | Attending: Family Medicine | Admitting: Family Medicine

## 2014-05-15 ENCOUNTER — Emergency Department (HOSPITAL_COMMUNITY): Payer: Medicaid Other

## 2014-05-15 DIAGNOSIS — F329 Major depressive disorder, single episode, unspecified: Secondary | ICD-10-CM | POA: Diagnosis present

## 2014-05-15 DIAGNOSIS — J189 Pneumonia, unspecified organism: Secondary | ICD-10-CM | POA: Diagnosis present

## 2014-05-15 DIAGNOSIS — I10 Essential (primary) hypertension: Secondary | ICD-10-CM | POA: Diagnosis present

## 2014-05-15 DIAGNOSIS — Z86711 Personal history of pulmonary embolism: Secondary | ICD-10-CM

## 2014-05-15 DIAGNOSIS — R0902 Hypoxemia: Secondary | ICD-10-CM | POA: Diagnosis present

## 2014-05-15 DIAGNOSIS — M329 Systemic lupus erythematosus, unspecified: Secondary | ICD-10-CM | POA: Diagnosis present

## 2014-05-15 DIAGNOSIS — L409 Psoriasis, unspecified: Secondary | ICD-10-CM | POA: Diagnosis present

## 2014-05-15 DIAGNOSIS — Z6841 Body Mass Index (BMI) 40.0 and over, adult: Secondary | ICD-10-CM

## 2014-05-15 DIAGNOSIS — R062 Wheezing: Secondary | ICD-10-CM

## 2014-05-15 DIAGNOSIS — F418 Other specified anxiety disorders: Secondary | ICD-10-CM | POA: Diagnosis present

## 2014-05-15 DIAGNOSIS — Z7952 Long term (current) use of systemic steroids: Secondary | ICD-10-CM | POA: Diagnosis not present

## 2014-05-15 DIAGNOSIS — I959 Hypotension, unspecified: Secondary | ICD-10-CM | POA: Diagnosis present

## 2014-05-15 DIAGNOSIS — R059 Cough, unspecified: Secondary | ICD-10-CM

## 2014-05-15 DIAGNOSIS — M069 Rheumatoid arthritis, unspecified: Secondary | ICD-10-CM | POA: Diagnosis present

## 2014-05-15 DIAGNOSIS — R05 Cough: Secondary | ICD-10-CM

## 2014-05-15 DIAGNOSIS — E669 Obesity, unspecified: Secondary | ICD-10-CM | POA: Diagnosis present

## 2014-05-15 DIAGNOSIS — R0602 Shortness of breath: Secondary | ICD-10-CM | POA: Diagnosis present

## 2014-05-15 HISTORY — DX: Other pulmonary embolism without acute cor pulmonale: I26.99

## 2014-05-15 HISTORY — DX: Headache, unspecified: R51.9

## 2014-05-15 HISTORY — DX: Pneumonia, unspecified organism: J18.9

## 2014-05-15 HISTORY — DX: Interstitial pulmonary disease, unspecified: J84.9

## 2014-05-15 HISTORY — DX: Headache: R51

## 2014-05-15 HISTORY — DX: Personal history of other medical treatment: Z92.89

## 2014-05-15 HISTORY — DX: Dependence on supplemental oxygen: Z99.81

## 2014-05-15 LAB — CBC WITH DIFFERENTIAL/PLATELET
Basophils Absolute: 0 10*3/uL (ref 0.0–0.1)
Basophils Relative: 0 % (ref 0–1)
EOS PCT: 1 % (ref 0–5)
Eosinophils Absolute: 0.1 10*3/uL (ref 0.0–0.7)
HCT: 44.4 % (ref 36.0–46.0)
Hemoglobin: 14.3 g/dL (ref 12.0–15.0)
Lymphocytes Relative: 38 % (ref 12–46)
Lymphs Abs: 3.4 10*3/uL (ref 0.7–4.0)
MCH: 29.5 pg (ref 26.0–34.0)
MCHC: 32.2 g/dL (ref 30.0–36.0)
MCV: 91.7 fL (ref 78.0–100.0)
Monocytes Absolute: 0.2 10*3/uL (ref 0.1–1.0)
Monocytes Relative: 3 % (ref 3–12)
NEUTROS ABS: 5.3 10*3/uL (ref 1.7–7.7)
Neutrophils Relative %: 58 % (ref 43–77)
Platelets: 194 10*3/uL (ref 150–400)
RBC: 4.84 MIL/uL (ref 3.87–5.11)
RDW: 13.6 % (ref 11.5–15.5)
WBC: 9 10*3/uL (ref 4.0–10.5)

## 2014-05-15 LAB — COMPREHENSIVE METABOLIC PANEL
ALT: 13 U/L (ref 0–35)
ANION GAP: 12 (ref 5–15)
AST: 37 U/L (ref 0–37)
Albumin: 3.3 g/dL — ABNORMAL LOW (ref 3.5–5.2)
Alkaline Phosphatase: 68 U/L (ref 39–117)
BUN: 11 mg/dL (ref 6–23)
CALCIUM: 8.4 mg/dL (ref 8.4–10.5)
CO2: 24 meq/L (ref 19–32)
CREATININE: 0.45 mg/dL — AB (ref 0.50–1.10)
Chloride: 103 mEq/L (ref 96–112)
GFR calc Af Amer: >90 mL/min (ref >90)
GLUCOSE: 85 mg/dL (ref 70–99)
Potassium: 4 mEq/L (ref 3.7–5.3)
Sodium: 139 mEq/L (ref 137–147)
Total Bilirubin: 0.5 mg/dL (ref 0.3–1.2)
Total Protein: 6.5 g/dL (ref 6.0–8.3)

## 2014-05-15 LAB — PRO B NATRIURETIC PEPTIDE: Pro B Natriuretic peptide (BNP): 26.7 pg/mL (ref 0–125)

## 2014-05-15 LAB — TROPONIN I

## 2014-05-15 MED ORDER — MORPHINE SULFATE 4 MG/ML IJ SOLN
2.0000 mg | INTRAMUSCULAR | Status: DC | PRN
  Administered 2014-05-15 – 2014-05-16 (×6): 2 mg via INTRAVENOUS
  Filled 2014-05-15 (×8): qty 1

## 2014-05-15 MED ORDER — ASPIRIN EC 81 MG PO TBEC
81.0000 mg | DELAYED_RELEASE_TABLET | Freq: Every day | ORAL | Status: DC
  Administered 2014-05-15 – 2014-05-17 (×3): 81 mg via ORAL
  Filled 2014-05-15 (×3): qty 1

## 2014-05-15 MED ORDER — ALBUTEROL SULFATE (2.5 MG/3ML) 0.083% IN NEBU: 2.5000 mg | INHALATION_SOLUTION | RESPIRATORY_TRACT | Status: DC

## 2014-05-15 MED ORDER — KETOROLAC TROMETHAMINE 30 MG/ML IJ SOLN
30.0000 mg | Freq: Once | INTRAMUSCULAR | Status: AC
  Administered 2014-05-15: 30 mg via INTRAVENOUS
  Filled 2014-05-15: qty 1

## 2014-05-15 MED ORDER — ALBUTEROL SULFATE (2.5 MG/3ML) 0.083% IN NEBU: 2.5000 mg | INHALATION_SOLUTION | RESPIRATORY_TRACT | Status: DC | PRN

## 2014-05-15 MED ORDER — ACETAMINOPHEN 650 MG RE SUPP: 650.0000 mg | Freq: Four times a day (QID) | RECTAL | Status: DC | PRN

## 2014-05-15 MED ORDER — HYDROXYCHLOROQUINE SULFATE 200 MG PO TABS
200.0000 mg | ORAL_TABLET | Freq: Two times a day (BID) | ORAL | Status: DC
  Administered 2014-05-16 – 2014-05-17 (×4): 200 mg via ORAL
  Filled 2014-05-15 (×7): qty 1

## 2014-05-15 MED ORDER — RIVAROXABAN 20 MG PO TABS
20.0000 mg | ORAL_TABLET | Freq: Every day | ORAL | Status: DC
  Administered 2014-05-15 – 2014-05-16 (×2): 20 mg via ORAL
  Filled 2014-05-15 (×3): qty 1

## 2014-05-15 MED ORDER — SODIUM CHLORIDE 0.9 % IV SOLN
INTRAVENOUS | Status: AC
  Administered 2014-05-15: 75 mL/h via INTRAVENOUS

## 2014-05-15 MED ORDER — ONDANSETRON HCL 4 MG/2ML IJ SOLN: 4.0000 mg | Freq: Three times a day (TID) | INTRAMUSCULAR | Status: AC | PRN

## 2014-05-15 MED ORDER — ACETAMINOPHEN 325 MG PO TABS
650.0000 mg | ORAL_TABLET | Freq: Four times a day (QID) | ORAL | Status: DC | PRN
  Administered 2014-05-15 – 2014-05-16 (×2): 650 mg via ORAL
  Filled 2014-05-15 (×3): qty 2

## 2014-05-15 MED ORDER — PREDNISONE 50 MG PO TABS
50.0000 mg | ORAL_TABLET | Freq: Every day | ORAL | Status: DC
  Administered 2014-05-16 – 2014-05-17 (×2): 50 mg via ORAL
  Filled 2014-05-15 (×3): qty 1

## 2014-05-15 MED ORDER — METHYLPREDNISOLONE SODIUM SUCC 125 MG IJ SOLR
125.0000 mg | Freq: Once | INTRAMUSCULAR | Status: AC
  Administered 2014-05-15: 125 mg via INTRAVENOUS
  Filled 2014-05-15: qty 2

## 2014-05-15 MED ORDER — GABAPENTIN 300 MG PO CAPS
300.0000 mg | ORAL_CAPSULE | Freq: Three times a day (TID) | ORAL | Status: DC
  Administered 2014-05-15 – 2014-05-17 (×5): 300 mg via ORAL
  Filled 2014-05-15 (×7): qty 1

## 2014-05-15 MED ORDER — AZATHIOPRINE 50 MG PO TABS
250.0000 mg | ORAL_TABLET | Freq: Every day | ORAL | Status: DC
  Administered 2014-05-16 – 2014-05-17 (×2): 250 mg via ORAL
  Filled 2014-05-15 (×2): qty 5

## 2014-05-15 MED ORDER — ALBUTEROL (5 MG/ML) CONTINUOUS INHALATION SOLN
10.0000 mg/h | INHALATION_SOLUTION | RESPIRATORY_TRACT | Status: AC
  Administered 2014-05-15: 10 mg/h via RESPIRATORY_TRACT
  Filled 2014-05-15: qty 20

## 2014-05-15 MED ORDER — SODIUM CHLORIDE 0.9 % IV BOLUS (SEPSIS)
1000.0000 mL | Freq: Once | INTRAVENOUS | Status: AC
Start: 2014-05-15 — End: 2014-05-15
  Administered 2014-05-15: 1000 mL via INTRAVENOUS

## 2014-05-15 MED ORDER — IPRATROPIUM-ALBUTEROL 0.5-2.5 (3) MG/3ML IN SOLN
3.0000 mL | Freq: Four times a day (QID) | RESPIRATORY_TRACT | Status: DC
  Administered 2014-05-15 – 2014-05-16 (×5): 3 mL via RESPIRATORY_TRACT
  Filled 2014-05-15 (×5): qty 3

## 2014-05-15 MED ORDER — DULOXETINE HCL 20 MG PO CPEP
20.0000 mg | ORAL_CAPSULE | Freq: Every day | ORAL | Status: DC
  Administered 2014-05-16 – 2014-05-17 (×2): 20 mg via ORAL
  Filled 2014-05-15 (×2): qty 1

## 2014-05-15 MED ORDER — LEVOFLOXACIN IN D5W 750 MG/150ML IV SOLN
750.0000 mg | INTRAVENOUS | Status: DC
  Filled 2014-05-15: qty 150

## 2014-05-15 MED ORDER — SODIUM CHLORIDE 0.9 % IV SOLN
INTRAVENOUS | Status: DC
  Administered 2014-05-15: via INTRAVENOUS

## 2014-05-15 MED ORDER — HYDROXYZINE HCL 10 MG PO TABS
10.0000 mg | ORAL_TABLET | Freq: Three times a day (TID) | ORAL | Status: DC | PRN
  Administered 2014-05-16: 10 mg via ORAL
  Filled 2014-05-15 (×2): qty 1

## 2014-05-15 MED ORDER — LEVOFLOXACIN IN D5W 750 MG/150ML IV SOLN
750.0000 mg | Freq: Once | INTRAVENOUS | Status: AC
  Administered 2014-05-15: 750 mg via INTRAVENOUS
  Filled 2014-05-15: qty 150

## 2014-05-15 NOTE — H&P (Signed)
Family Medicine Teaching [facility] Admission History and Physical Service Pager: [phone]  Patient name: Donna Hall Chilel Medical record number: [id number] Date of birth: 05/29/1986 Age: 28 y.o. Gender: female  Primary Care Provider: Lora Paula, MD Consultants: None Code Status: Full  Chief Complaint: Shortness of breath  Assessment and Plan: Donna Hall is a 29 y.o. female presenting with dyspnea, chest pain, and rash for 2 weeks. PMH is significant for SLE, prior PE, HTN, and depression/anxiety.  Dyspnea. Likely etiologies include CAP (infiltrates on CXR, subjective fevers), reactive airway (h/o or reactive airway, improvement with albuterol treatment) or lupus flare/interstital lung disease (exacerbation constellation of symptoms including rash and joint pain).  Can consider PE as potential cause, though less likely given Wells Score of 3 and patient on chronic xarelto therapy due to prior PE. Can also consider influenza infection, though management would be mainly symptomatic this late in patient's course. Also potentially CHF exacerbation given orthopnea and LE edema, though patient with no known history of CHF, normal BNP here, and no appreciated crackles on exam.  - Levaquin (11/20- ), started on azithro per PCP on 11/19 - Duonebs q 6 hours - s/p 1 dose of solumedrol - Prednisone 50mg  daily for presumed SLE flare or potential reactive airway component - f/u LE dopplers  - Can consider CTA if symptoms persist and LE dopplers negative - Wean oxygen as tolerated  Chest Pain. Heart score of 1. Risk factors include obesity. ISTAT troponin in ED negative. EKG with sinus tachycardia, negative for signs of ischemia -Cycle troponins -EKG in the morning  Lupus/rash. Rash likely psoriasis with a component of lupus flare as described above. Will continue home plaquenil, and imuran. -Continue home dose of xarelto  -Prednisone to 50mg  daily for acute flare.  H/o  HTN, Hypotension. Not currently on any home antihypertensives. Hypotension resolved in ED with fluid bolus.  -Continue to monitor  Psych. Continue home Cymbalta, hydroxyzine.  FEN/GI: Regular diet, NS @ 75cc/hr Prophylaxis: On home xarelto   Disposition: Admitted to telemetry pending improvement of respiratory status under attending Dr  History of Present Illness: Donna Hall is a 28 y.o. female presenting with dyspnea, cough, chest pain, diffuse body rash, and myalgias for the past 2 weeks, with her symptoms significantly worsening over the past several days. Chest pain is intermittent and located at the center of her chest with no radiation. Pain described as sharp and like "pins and needles." Worse with deep inspiration. No alleviating factors. Dyspnea worse with exertion and worsening overall for the past 2 weeks. Not on oxygen at home. Has not used albuterol inhaler. Subjective fever 2 days ago. No chills. Rash is located all over her body and pruritic. Also reports increased right knee pain that has made walking difficult. Patient reports increased lower extremity swelling for the past several weeks and reports that she sometimes feels like she is choking when she lies flat. Currently sleeping on 1 pillow at night.  Patient reports adherence to her medications, however she is unable to read and thus is unsure of what exactly she is taking.  In the ED, patient was initially hypotensive to 94/80 and was hypoxic to the upper 80s on room air. She responded to 1L NS bolus. CXR revealed bilateral infiltrates. She was started on levaquin and was given an albuterol nebulizer, however continued to have an oxygen requirement.   Review Of Systems: Per HPI, Otherwise 12 point review of systems was performed and was unremarkable.  Patient Active Problem List   Diagnosis Date Noted  . Hypoxia 05/15/2014  . Anxiety and depression 05/14/2014  . Knee pain, right 04/02/2014  . Pulmonary  embolism 03/16/2014  . CAP (community acquired pneumonia) 03/12/2014  . Essential hypertension, benign 03/12/2014  . Systemic lupus erythematosus 06/15/2013  . Obesity, Class III, BMI 40-49.9 (morbid obesity) 03/23/2012  . Rheumatoid arthritis 03/07/2012  . Psoriasis 03/07/2012  . Synovitis 03/07/2012  . Chronic steroid use 03/07/2012  . INTERSTITIAL LUNG DISEASE 05/24/2009  . PERICARDIAL EFFUSION 12/21/2008   Past Medical History: Past Medical History  Diagnosis Date  . Psoriasis 2010    as a child  . Chronic pain disorder   . Obesity   . Chronic steroid use 2010  . Bronchitis   . Lupus 2000  . Lupus (systemic lupus erythematosus) 2010  . Rheumatoid arthritis(714.0) 2010  . Gestational diabetes 2006  . Hypertension 2010   Past Surgical History: Past Surgical History  Procedure Laterality Date  . R thigh biopsy Right 2011   Social History: History  Substance Use Topics  . Smoking status: Never Smoker   . Smokeless tobacco: Never Used  . Alcohol Use: No   Additional social history: None Please also refer to relevant sections of EMR.  Family History: Family History  Problem Relation Age of Onset  . Diabetes Mother   . Hypertension Mother   . Cancer Neg Hx   . Early death Neg Hx   . Heart disease Neg Hx    Allergies and Medications: No Known Allergies No current facility-administered medications on file prior to encounter.   Current Outpatient Prescriptions on File Prior to Encounter  Medication Sig Dispense Refill  . albuterol (PROVENTIL HFA;VENTOLIN HFA) 108 (90 BASE) MCG/ACT inhaler Inhale 2 puffs into the lungs every 4 (four) hours as needed for wheezing or shortness of breath. 8 g 6  . azaTHIOprine (IMURAN) 50 MG tablet Take 5 tablets (250 mg total) by mouth daily. 150 tablet 2  . azithromycin (ZITHROMAX) 500 MG tablet Take 1 tablet (500 mg total) by mouth daily. 5 tablet 0  . betamethasone dipropionate (DIPROLENE) 0.05 % cream Apply topically 2 (two)  times daily. 30 g 0  . DULoxetine (CYMBALTA) 20 MG capsule Take 1 capsule (20 mg total) by mouth daily. 60 capsule 1  . gabapentin (NEURONTIN) 300 MG capsule Take 300 mg by mouth 3 (three) times daily.    . hydroxychloroquine (PLAQUENIL) 200 MG tablet Take 1 tablet (200 mg total) by mouth 2 (two) times daily. 120 tablet 1  . hydrOXYzine (ATARAX/VISTARIL) 10 MG tablet Take 1 tablet (10 mg total) by mouth every 8 (eight) hours as needed for itching. 90 tablet 2  . meloxicam (MOBIC) 15 MG tablet Take 15 mg by mouth daily.    . predniSONE (DELTASONE) 10 MG tablet Take 1 tablet (10 mg total) by mouth daily with breakfast. 90 tablet 1  . rivaroxaban (XARELTO) 20 MG TABS tablet Take 1 tablet (20 mg total) by mouth daily with supper. 30 tablet 7  . traMADol (ULTRAM) 50 MG tablet Take 1-2 tablets (50-100 mg total) by mouth every 8 (eight) hours as needed. 60 tablet 2    Objective: BP 112/55 mmHg  Pulse 115  Temp(Src) 98.8 F (37.1 C) (Oral)  Resp 20  Ht 4\' 10"  (1.473 m)  Wt 200 lb (90.719 kg)  BMI 41.81 kg/m2  SpO2 96%  LMP 04/28/2014 Exam: General: Lying in bed with nasal cannula in place, no apparent distress HEENT:  EOMI, MMM Cardiovascular: Distant heart sounds, tachycardic, regular rhythm. No murmurs appreciated Respiratory: Mildly increased work of breathing, nasal cannula in place. Diffuse scattered insp/exp wheezes in all lung fields.  Abdomen: +BS, S, NTND Extremities: 2+ pitting edema to knees in LE bilaterally Skin: Diffuse, scaly, rash with discrete raised erythematous lesions, most prominent on torso Neuro: Alert, move all extremities spontaneously, no obvious focal deficits.   Labs and Imaging: CBC BMET   Recent Labs Lab 05/15/14 1021  WBC 9.0  HGB 14.3  HCT 44.4  PLT 194    Recent Labs Lab 05/15/14 0930  NA 139  K 4.0  CL 103  CO2 24  BUN 11  CREATININE 0.45*  GLUCOSE 85  CALCIUM 8.4     Troponin - negative Pro-BNP 26.7  EKG: sinus tachycardia, no signs  of acute ischemia  Chest XR 05/15/2014 IMPRESSION: Low volume film with diffuse interstitial and patchy bilateral alveolar opacity. Given the long persistence of this appearance, the features probably represent a component of underlying chronic interstitial and alveolar disease. While overt pulmonary edema is considered unlikely, a component of superimposed infection cannot be entirely excluded.  Jacquiline Doe, MD 05/15/2014, 1:24 PM PGY-1, [place] FPTS Intern pager: [phone], text pages welcome   I have seen and examined the patient with Dr. Jimmey Ralph and I agree with his documentation above. I have reviewed the note in detail and updated it as indicated in blue.  Murtis Sink, MD [facility] Family Medicine Resident, PGY-3 05/15/2014, 5:43 PM

## 2014-05-15 NOTE — ED Notes (Signed)
pts ambulatory c/o feeling tired oxygen sat decraesed to o90% with hr at 115/min

## 2014-05-15 NOTE — ED Notes (Signed)
Diet tray ordered for pt 

## 2014-05-15 NOTE — ED Notes (Signed)
Admitting at bedside 

## 2014-05-15 NOTE — ED Provider Notes (Signed)
CSN: [id number]     Arrival date & time 05/15/14  5784 History   First MD Initiated Contact with Patient 05/15/14 0900     Chief Complaint  Patient presents with  . Cough     (Consider location/radiation/quality/duration/timing/severity/associated sxs/prior Treatment) HPI Comments: The patient is a 28 year old female who is morbidly obese, has a history of systemic lupus, rheumatoid arthritis, likely interstitial lung disease and a history of pulmonary embolism for which she is anticoagulated on a novel oral anticoagulant. She presents with a complaint of chest pain which has been present for 3 days associated with bilateral wrist pain and joint pain for the last 3 days as well.. She describes this as a sharp pain in the middle of her chest, it has been associated with 2 weeks of coughing and shortness of breath. She has also had a rash over her entire trunk for the last 2 weeks as well. This is an itchy rash. This pain is worse with deep breathing, worse with coughing, worse with palpation of the chest. Nothing seems to make this better, has tried her prescription medications including anti-inflammatories at home without relief. She does have a history of some reactive airway disease and uses an inhaler, this has not been improving her symptoms.  Patient is a 28 y.o. female presenting with cough. The history is provided by the patient and medical records.  Cough Cough characteristics:  Non-productive   Past Medical History  Diagnosis Date  . Psoriasis 2010    as a child  . Chronic pain disorder   . Obesity   . Chronic steroid use 2010  . Bronchitis   . Lupus 2000  . Lupus (systemic lupus erythematosus) 2010  . Rheumatoid arthritis(714.0) 2010  . Gestational diabetes 2006  . Hypertension 2010   Past Surgical History  Procedure Laterality Date  . R thigh biopsy Right 2011   Family History  Problem Relation Age of Onset  . Diabetes Mother   . Hypertension Mother   . Cancer Neg  Hx   . Early death Neg Hx   . Heart disease Neg Hx    History  Substance Use Topics  . Smoking status: Never Smoker   . Smokeless tobacco: Never Used  . Alcohol Use: No   OB History    No data available     Review of Systems  Respiratory: Positive for cough.   All other systems reviewed and are negative.     Allergies  Review of patient's allergies indicates no known allergies.  Home Medications   Prior to Admission medications   Medication Sig Start Date End Date Taking? Authorizing Provider  albuterol (PROVENTIL HFA;VENTOLIN HFA) 108 (90 BASE) MCG/ACT inhaler Inhale 2 puffs into the lungs every 4 (four) hours as needed for wheezing or shortness of breath. 05/14/14   Lora Paula, MD  azaTHIOprine (IMURAN) 50 MG tablet Take 5 tablets (250 mg total) by mouth daily. 04/28/14   Josalyn C Funches, MD  azithromycin (ZITHROMAX) 500 MG tablet Take 1 tablet (500 mg total) by mouth daily. 05/14/14   Josalyn C Funches, MD  betamethasone dipropionate (DIPROLENE) 0.05 % cream Apply topically 2 (two) times daily. 05/14/14   Josalyn C Funches, MD  DULoxetine (CYMBALTA) 20 MG capsule Take 1 capsule (20 mg total) by mouth daily. 05/14/14   Josalyn C Funches, MD  gabapentin (NEURONTIN) 300 MG capsule Take 300 mg by mouth 3 (three) times daily.    Historical Provider, MD  hydroxychloroquine (PLAQUENIL) 200 MG  tablet Take 1 tablet (200 mg total) by mouth 2 (two) times daily. 05/14/14   Lora Paula, MD  hydrOXYzine (ATARAX/VISTARIL) 10 MG tablet Take 1 tablet (10 mg total) by mouth every 8 (eight) hours as needed for itching. 05/14/14   Josalyn C Funches, MD  meloxicam (MOBIC) 15 MG tablet Take 15 mg by mouth daily.    Historical Provider, MD  predniSONE (DELTASONE) 10 MG tablet Take 1 tablet (10 mg total) by mouth daily with breakfast. 04/14/14   Lora Paula, MD  rivaroxaban (XARELTO) 20 MG TABS tablet Take 1 tablet (20 mg total) by mouth daily with supper. 04/02/14   Josalyn C  Funches, MD  traMADol (ULTRAM) 50 MG tablet Take 1-2 tablets (50-100 mg total) by mouth every 8 (eight) hours as needed. 05/14/14   Josalyn C Funches, MD   BP 112/55 mmHg  Pulse 115  Temp(Src) 98.8 F (37.1 C) (Oral)  Resp 20  Ht 4\' 10"  (1.473 m)  Wt 200 lb (90.719 kg)  BMI 41.81 kg/m2  SpO2 96%  LMP 04/28/2014 Physical Exam  Constitutional: She appears well-developed and well-nourished.  Uncomfortable appearing  HENT:  Head: Normocephalic and atraumatic.  Mouth/Throat: Oropharynx is clear and moist. No oropharyngeal exudate.  Oropharynx is clear and moist, no signs of mucosal lesions, no exudate, no asymmetry, no erythema, tongue appears normal.  Eyes: Conjunctivae and EOM are normal. Pupils are equal, round, and reactive to light. Right eye exhibits no discharge. Left eye exhibits no discharge. No scleral icterus.  Neck: Normal range of motion. Neck supple. No JVD present. No thyromegaly present.  Cardiovascular: Regular rhythm, normal heart sounds and intact distal pulses.  Exam reveals no gallop and no friction rub.   No murmur heard. Mild tachycardia  Pulmonary/Chest: Effort normal. No respiratory distress. She has wheezes. She has rales. She exhibits tenderness (tender to palpation in the sternal area).  Scattered wheezing and rales, no tachypnea  Abdominal: Soft. Bowel sounds are normal. She exhibits no distension and no mass. There is no tenderness.  Musculoskeletal: Normal range of motion. She exhibits tenderness (pain with range of motion of the bilateral wrists, knees, ankles, shoulders, wrists are the worst.). She exhibits no edema.  Lymphadenopathy:    She has no cervical adenopathy.  Neurological: She is alert. Coordination normal.  Skin: Skin is warm and dry. Rash noted. No erythema.  Diffuse rash which is erythematous, patchy, which is scaling.  No urticaria, no vesicles, no pustules, no pet / purp.  Psychiatric: She has a normal mood and affect. Her behavior is  normal.  Nursing note and vitals reviewed.   ED Course  Procedures (including critical care time) Labs Review Labs Reviewed  COMPREHENSIVE METABOLIC PANEL - Abnormal; Notable for the following:    Creatinine, Ser 0.45 (*)    Albumin 3.3 (*)    All other components within normal limits  TROPONIN I  CBC WITH DIFFERENTIAL  PRO B NATRIURETIC PEPTIDE  CBC WITH DIFFERENTIAL    Imaging Review Dg Chest 2 View  05/15/2014   CLINICAL DATA:  Initial encounter for all Cough left-sided body pain.  EXAM: CHEST  2 VIEW  COMPARISON:  Chest CT from 03/11/2014.  Chest x-ray from 03/11/2014.  FINDINGS: Low volume film with asymmetric elevation of the right hemidiaphragm as before. There is diffuse interstitial patchy alveolar opacity similar to the previous study and also when comparing back to 06/14/2013. Cardiopericardial silhouette is at upper limits of normal for size. Imaged bony structures of the  thorax are intact. Telemetry leads overlie the chest.  IMPRESSION: Low volume film with diffuse interstitial and patchy bilateral alveolar opacity. Given the long persistence of this appearance, the features probably represent a component of underlying chronic interstitial and alveolar disease. While overt pulmonary edema is considered unlikely, a component of superimposed infection cannot be entirely excluded.   Electronically Signed   By: Kennith Center M.D.   On: 05/15/2014 11:35     EKG Interpretation   Date/Time:  Friday May 15 2014 08:59:15 EST Ventricular Rate:  101 PR Interval:  122 QRS Duration: 82 QT Interval:  348 QTC Calculation: 451 R Axis:   63 Text Interpretation:  Sinus tachycardia Otherwise normal ECG since last  tracing no significant change Confirmed by Brendolyn Stockley  MD, Kaniesha Barile ([id number]) on  05/15/2014 9:06:35 AM      MDM   Final diagnoses:  Cough  Wheezing  Hypoxia    Concern for her hypoxia, tachycardia and wheezin g- has acute RAD, possible pna, diffuse joint pains -  already on prednisone, may need hydrocortisone for her hypotension, fluids.      X-ray shows bilateral infiltrates, possibly interstitial lung disease, labs with no leukocytosis, normal metabolic panel, normal troponin and a normal BNP. She has increased wheezing, required nebulizer therapy, due to ongoing hypoxia and borderline blood pressure she will need to be admitted, medications given as below to cover for healthcare associated pneumonia, discussed with family practice resident Dr. Jimmey Ralph who will admit.  Meds given in ED:  Medications  albuterol (PROVENTIL,VENTOLIN) solution continuous neb (0 mg/hr Nebulization Stopped 05/15/14 1047)  morphine 4 MG/ML injection 2 mg (2 mg Intravenous Given 05/15/14 1145)  levofloxacin (LEVAQUIN) IVPB 750 mg (750 mg Intravenous New Bag/Given 05/15/14 1301)  sodium chloride 0.9 % bolus 1,000 mL (0 mLs Intravenous Stopped 05/15/14 1024)  ketorolac (TORADOL) 30 MG/ML injection 30 mg (30 mg Intravenous Given 05/15/14 0930)    New Prescriptions   No medications on file      Vida Roller, MD 05/15/14 1323

## 2014-05-15 NOTE — ED Notes (Signed)
C/o cough and rash all over body for 2 weeks and pain to joints for 3 days

## 2014-05-15 NOTE — ED Notes (Signed)
Attempted report x1. 

## 2014-05-16 DIAGNOSIS — R0602 Shortness of breath: Secondary | ICD-10-CM

## 2014-05-16 DIAGNOSIS — R062 Wheezing: Secondary | ICD-10-CM

## 2014-05-16 DIAGNOSIS — J189 Pneumonia, unspecified organism: Principal | ICD-10-CM

## 2014-05-16 DIAGNOSIS — R0902 Hypoxemia: Secondary | ICD-10-CM

## 2014-05-16 LAB — CBC
HCT: 46.1 % — ABNORMAL HIGH (ref 36.0–46.0)
HEMOGLOBIN: 14.6 g/dL (ref 12.0–15.0)
MCH: 29.2 pg (ref 26.0–34.0)
MCHC: 31.7 g/dL (ref 30.0–36.0)
MCV: 92.2 fL (ref 78.0–100.0)
Platelets: 223 10*3/uL (ref 150–400)
RBC: 5 MIL/uL (ref 3.87–5.11)
RDW: 13.6 % (ref 11.5–15.5)
WBC: 6 10*3/uL (ref 4.0–10.5)

## 2014-05-16 LAB — BASIC METABOLIC PANEL
Anion gap: 14 (ref 5–15)
BUN: 9 mg/dL (ref 6–23)
CO2: 24 meq/L (ref 19–32)
Calcium: 8.8 mg/dL (ref 8.4–10.5)
Chloride: 103 mEq/L (ref 96–112)
Creatinine, Ser: 0.39 mg/dL — ABNORMAL LOW (ref 0.50–1.10)
GFR calc Af Amer: >90 mL/min (ref >90)
GLUCOSE: 135 mg/dL — AB (ref 70–99)
Potassium: 4.6 mEq/L (ref 3.7–5.3)
SODIUM: 141 meq/L (ref 137–147)

## 2014-05-16 LAB — TROPONIN I

## 2014-05-16 MED ORDER — MORPHINE SULFATE 2 MG/ML IJ SOLN
2.0000 mg | INTRAMUSCULAR | Status: DC | PRN
  Administered 2014-05-16: 2 mg via INTRAVENOUS
  Filled 2014-05-16: qty 1

## 2014-05-16 MED ORDER — MELOXICAM 15 MG PO TABS
15.0000 mg | ORAL_TABLET | Freq: Every day | ORAL | Status: DC
  Administered 2014-05-16 – 2014-05-17 (×2): 15 mg via ORAL
  Filled 2014-05-16 (×2): qty 1

## 2014-05-16 MED ORDER — CYCLOBENZAPRINE HCL 5 MG PO TABS
5.0000 mg | ORAL_TABLET | Freq: Once | ORAL | Status: AC
  Administered 2014-05-16: 5 mg via ORAL
  Filled 2014-05-16 (×2): qty 1

## 2014-05-16 MED ORDER — LEVOFLOXACIN 750 MG PO TABS
750.0000 mg | ORAL_TABLET | Freq: Every day | ORAL | Status: DC
  Administered 2014-05-16 – 2014-05-17 (×2): 750 mg via ORAL
  Filled 2014-05-16 (×2): qty 1

## 2014-05-16 MED ORDER — ONDANSETRON HCL 4 MG/2ML IJ SOLN: 4.0000 mg | Freq: Three times a day (TID) | INTRAMUSCULAR | Status: DC | PRN

## 2014-05-16 MED ORDER — TRAMADOL HCL 50 MG PO TABS
50.0000 mg | ORAL_TABLET | Freq: Four times a day (QID) | ORAL | Status: DC | PRN
  Administered 2014-05-17: 50 mg via ORAL
  Filled 2014-05-16: qty 1

## 2014-05-16 MED ORDER — TRAMADOL HCL 50 MG PO TABS
50.0000 mg | ORAL_TABLET | Freq: Once | ORAL | Status: AC
  Administered 2014-05-16: 50 mg via ORAL
  Filled 2014-05-16: qty 1

## 2014-05-16 MED ORDER — ALBUTEROL SULFATE (2.5 MG/3ML) 0.083% IN NEBU: 2.5000 mg | INHALATION_SOLUTION | Freq: Four times a day (QID) | RESPIRATORY_TRACT | Status: DC | PRN

## 2014-05-16 NOTE — Progress Notes (Signed)
MD paged regarding pt's c/o ha; pt given PO Tylenol at 1400; MD to order home Mobic; will administer when available.

## 2014-05-16 NOTE — Progress Notes (Signed)
VASCULAR LAB PRELIMINARY  PRELIMINARY  PRELIMINARY  PRELIMINARY  Bilateral lower extremity venous Dopplers completed.    Preliminary report:  There is no DVT or SVT noted in the bilateral lower extremities.   Adib Wahba, RVT 05/16/2014, 6:03 PM

## 2014-05-16 NOTE — Progress Notes (Signed)
Family Medicine Teaching Service Daily Progress Note Intern Pager: [phone]  Patient name: Donna Hall Medical record number: [id number] Date of birth: 1985-07-23 Age: 28 y.o. Gender: female  Primary Care Provider: Lora Paula, MD Consultants: none Code Status: full  Pt Overview and Major Events to Date:  11/19 - diagnosed with CAP in clinic, placed on azithro 11/20 - admitted with chest pain and SOB, switched to levaquin  Assessment and Plan: Donna Hall is a 28 y.o. female presenting with dyspnea, chest pain, and rash for 2 weeks. PMH is significant for SLE, prior PE, HTN, and depression/anxiety.  Dyspnea. CAP (infiltrates on CXR, subjective fevers), vs lupus flare/interstital lung disease (exacerbation constellation of symptoms including rash and joint pain) vs PE (Wells Score of 3 and patient on chronic xarelto therapy due to prior PE)  - Switch to oral Levaquin (11/20- ), started on azithro per PCP on 11/19 - Duonebs q 6 hours - s/p 1 dose of solumedrol - Prednisone 50mg  daily for presumed SLE flare or potential reactive airway component - f/u LE dopplers  - Can consider CTA if symptoms persist and LE dopplers negative - Wean oxygen as tolerated  Chest Pain. Likely related to pulm process above. Heart score of 1. Risk factors include obesity. ISTAT troponin in ED negative. EKG with sinus tachycardia, negative for signs of ischemia.  -Cycle troponins -F/u repeat EKG  Lupus/rash. Rash likely psoriasis with a component of lupus flare as described above. Will continue home plaquenil, and imuran. -Continue home dose of xarelto  -Prednisone to 50mg  daily for acute flare.  Psych. Continue home Cymbalta, hydroxyzine.  FEN/GI: Regular diet, SLIV Prophylaxis: On home xarelto   Disposition: likely home later today if still feeling well and dopplers negative  Subjective:  Feeling much better, just tired, breathing comfortably and denies pain, would like  to go home today if possible  Objective: Temp:  [98.1 F (36.7 C)-98.8 F (37.1 C)] 98.3 F (36.8 C) (11/21 0515) Pulse Rate:  [82-118] 82 (11/21 0515) Resp:  [16-27] 18 (11/21 0515) BP: (94-139)/(50-116) 124/81 mmHg (11/21 0515) SpO2:  [91 %-100 %] 94 % (11/21 0515) Weight:  [200 lb (90.719 kg)] 200 lb (90.719 kg) (11/20 0911) Physical Exam: General: Lying in bed with nasal cannula in place, no apparent distress HEENT: NCAT, MMM Cardiovascular: Distant heart sounds, tachycardic, regular rhythm. No murmurs appreciated Respiratory: normal work of breathing, nasal cannula in place. Diffuse scattered ins/exp wheezes in all lung fields. Abdomen: +BS, S, NTND Extremities: 2+ pitting edema to knees in LE bilaterally Skin: Diffuse, scaly, rash with discrete raised erythematous lesions, most prominent on torso Neuro: Alert, move all extremities spontaneously, no obvious focal deficits.  Laboratory:  Recent Labs Lab 05/15/14 1021 05/16/14 0520  WBC 9.0 6.0  HGB 14.3 14.6  HCT 44.4 46.1*  PLT 194 223    Recent Labs Lab 05/15/14 0930 05/16/14 0520  NA 139 141  K 4.0 4.6  CL 103 103  CO2 24 24  BUN 11 9  CREATININE 0.45* 0.39*  CALCIUM 8.4 8.8  PROT 6.5  --   BILITOT 0.5  --   ALKPHOS 68  --   ALT 13  --   AST 37  --   GLUCOSE 85 135*   Trop neg x2 BNP 26.7  Imaging/Diagnostic Tests: EKG 11/20: sinus tachycardia, no signs of acute ischemia  Chest XR 11/20 Low volume film with diffuse interstitial and patchy bilateral alveolar opacity. Given the long persistence of this appearance, the  features probably represent a component of underlying chronic interstitial and alveolar disease. While overt pulmonary edema is considered unlikely, a component of superimposed infection cannot be entirely excluded.  Abram Sander, MD 05/16/2014, 7:50 AM PGY-2, [facility] FPTS Intern pager: [phone], text pages welcome

## 2014-05-16 NOTE — Plan of Care (Signed)
Problem: Phase I Progression Outcomes Goal: Confirm chest x-ray completed Outcome: Completed/Met Date Met:  05/16/14 Goal: Voiding-avoid urinary catheter unless indicated Outcome: Completed/Met Date Met:  05/16/14 Goal: Hemodynamically stable Outcome: Completed/Met Date Met:  05/16/14

## 2014-05-17 DIAGNOSIS — M329 Systemic lupus erythematosus, unspecified: Secondary | ICD-10-CM

## 2014-05-17 MED ORDER — LEVOFLOXACIN 750 MG PO TABS: 750.0000 mg | ORAL_TABLET | Freq: Every day | ORAL | Status: DC

## 2014-05-17 MED ORDER — PREDNISONE 10 MG PO TABS: ORAL_TABLET | ORAL | Status: DC

## 2014-05-17 NOTE — Plan of Care (Signed)
Problem: Phase I Progression Outcomes Goal: OOB as tolerated unless otherwise ordered Outcome: Progressing     

## 2014-05-17 NOTE — Progress Notes (Signed)
Discharge education completed by RN. Pt received a copy of discharge paperwork and confirms understanding of follow up appointments and discharge medications. Pt denies any questions at this time. IV removed, site is within normal limits. Pt will discharge from the unit via wheelchair. 

## 2014-05-17 NOTE — Plan of Care (Signed)
Problem: Phase II Progression Outcomes Goal: Wean O2 if indicated Outcome: Progressing Pt ambulated 350 feet on room air, oxygen saturation level 91%.

## 2014-05-17 NOTE — Progress Notes (Signed)
Utilization Review Completed.Donna Hall T11/22/2015  

## 2014-05-17 NOTE — Progress Notes (Signed)
Family Medicine Teaching Service Daily Progress Note Intern Pager: [phone]  Patient name: Georgiann Neider Chilel Medical record number: [id number] Date of birth: Nov 04, 1985 Age: 28 y.o. Gender: female  Primary Care Provider: Lora Paula, MD Consultants: none Code Status: full  Pt Overview and Major Events to Date:  11/19 - diagnosed with CAP in clinic, placed on azithro 11/20 - admitted with chest pain and SOB, switched to levaquin  Assessment and Plan: Nisreen Guise is a 28 y.o. female presenting with dyspnea, chest pain, and rash for 2 weeks. PMH is significant for SLE, prior PE, HTN, and depression/anxiety.  Dyspnea. CAP (infiltrates on CXR, subjective fevers), vs lupus flare/interstital lung disease (exacerbation constellation of symptoms including rash and joint pain) vs PE (Wells Score of 3 and patient on chronic xarelto therapy due to prior PE)  - Continue Levaquin (11/20- ) - Duonebs q 6 hours - s/p 1 dose of solumedrol - Prednisone 50mg  daily for presumed SLE flare or potential reactive airway component, taper as outpatient - LE dopplers negative for DVT - Wean oxygen as tolerated  Chest Pain. Likely related to pulm process above. Heart score of 1. Risk factors include obesity. ISTAT troponin in ED negative. EKG with sinus tachycardia, negative for signs of ischemia.  - troponins negative - EKG normal and stable  Lupus/rash. Rash likely psoriasis with a component of lupus flare as described above. Will continue home plaquenil, and imuran. -Continue home dose of xarelto  -Prednisone to 50mg  daily for acute flare.  Psych. Continue home Cymbalta, hydroxyzine.  FEN/GI: Regular diet, SLIV Prophylaxis: On home xarelto   Disposition: likely home later today   Subjective:  Feeling much better, just tired, breathing comfortably on room air and denies pain, would like to go home  Objective: Temp:  [97.8 F (36.6 C)-98.3 F (36.8 C)] 97.8 F (36.6 C)  (11/22 0518) Pulse Rate:  [61-88] 61 (11/22 0518) Resp:  [18] 18 (11/22 0518) BP: (101-113)/(47-61) 108/61 mmHg (11/22 0518) SpO2:  [94 %-98 %] 94 % (11/22 0518) FiO2 (%):  [28 %] 28 % (11/21 0805) Physical Exam: General: Lying in bed with nasal cannula in place, no apparent distress HEENT: NCAT, MMM Cardiovascular: Distant heart sounds,RRR. No murmurs appreciated Respiratory: normal work of breathing. Rare scattered wheezes. Abdomen: +BS, S, NTND Extremities: 2+ pitting edema to knees in LE bilaterally Skin: Diffuse, scaly, rash with discrete raised erythematous lesions, most prominent on torso Neuro: Alert, move all extremities spontaneously, no obvious focal deficits.  Laboratory:  Recent Labs Lab 05/15/14 1021 05/16/14 0520  WBC 9.0 6.0  HGB 14.3 14.6  HCT 44.4 46.1*  PLT 194 223    Recent Labs Lab 05/15/14 0930 05/16/14 0520  NA 139 141  K 4.0 4.6  CL 103 103  CO2 24 24  BUN 11 9  CREATININE 0.45* 0.39*  CALCIUM 8.4 8.8  PROT 6.5  --   BILITOT 0.5  --   ALKPHOS 68  --   ALT 13  --   AST 37  --   GLUCOSE 85 135*   Trop neg x2 BNP 26.7  Imaging/Diagnostic Tests: EKG 11/20: sinus tachycardia, no signs of acute ischemia EKG 11/21: NSR, no T-wave or ST changes  Chest XR 11/20 Low volume film with diffuse interstitial and patchy bilateral alveolar opacity. Given the long persistence of this appearance, the features probably represent a component of underlying chronic interstitial and alveolar disease. While overt pulmonary edema is considered unlikely, a component of superimposed infection cannot be  entirely excluded.  Abram Sander, MD 05/17/2014, 7:11 AM PGY-2, [facility] FPTS Intern pager: [phone], text pages welcome

## 2014-05-18 NOTE — Discharge Summary (Signed)
Family Medicine Teaching [facility] Discharge Summary  Patient name: Donna Hall Medical record number: [id number] Date of birth: April 23, 1986 Age: 28 y.o. Gender: female Date of Admission: 05/15/2014  Date of Discharge: 05/17/2014 Admitting Physician: Janit Pagan, MD  Primary Care Provider: Lora Paula, MD Consultants: none  Indication for Hospitalization: chest pain  Discharge Diagnoses/Problem List:  Patient Active Problem List   Diagnosis Date Noted  . SLE exacerbation   . Wheezing   . Hypoxia 05/15/2014  . Anxiety and depression 05/14/2014  . Knee pain, right 04/02/2014  . Pulmonary embolism 03/16/2014  . CAP (community acquired pneumonia) 03/12/2014  . Essential hypertension, benign 03/12/2014  . Systemic lupus erythematosus 06/15/2013  . Obesity, Class III, BMI 40-49.9 (morbid obesity) 03/23/2012  . Rheumatoid arthritis 03/07/2012  . Psoriasis 03/07/2012  . Synovitis 03/07/2012  . Chronic steroid use 03/07/2012  . INTERSTITIAL LUNG DISEASE 05/24/2009  . PERICARDIAL EFFUSION 12/21/2008     Disposition: home  Discharge Condition: improved  Brief Hospital Course:  Patient was admitted for chest pain and shortness of breath shortly after being diagnosed with a pneumonia in clinic. She had wheezes on exam and was thought to have a SLE exacerbation vs. reactive airway component to her dyspnea. Her troponin was normal and her EKG was unchanged. She had some leg swelling so LE dopplers were done to rule out DVT which were normal. She improved with levaquin, albuterol and systemic steroids and was discharged with a prolonged prednisone taper.  Issues for Follow Up:  Steroids: monitor dyspnea with taper and consider PFTs  Rash: encourage topical steroid use and escalate to high potency if still not controlled  Significant Procedures: none  Significant Labs and Imaging:   Recent Labs Lab 05/15/14 1021 05/16/14 0520  WBC 9.0 6.0  HGB 14.3 14.6   HCT 44.4 46.1*  PLT 194 223    Recent Labs Lab 05/15/14 0930 05/16/14 0520  NA 139 141  K 4.0 4.6  CL 103 103  CO2 24 24  GLUCOSE 85 135*  BUN 11 9  CREATININE 0.45* 0.39*  CALCIUM 8.4 8.8  ALKPHOS 68  --   AST 37  --   ALT 13  --   ALBUMIN 3.3*  --    Dg Chest 2 View  05/15/2014   CLINICAL DATA:  Initial encounter for all Cough left-sided body pain.  EXAM: CHEST  2 VIEW  COMPARISON:  Chest CT from 03/11/2014.  Chest x-ray from 03/11/2014.  FINDINGS: Low volume film with asymmetric elevation of the right hemidiaphragm as before. There is diffuse interstitial patchy alveolar opacity similar to the previous study and also when comparing back to 06/14/2013. Cardiopericardial silhouette is at upper limits of normal for size. Imaged bony structures of the thorax are intact. Telemetry leads overlie the chest.  IMPRESSION: Low volume film with diffuse interstitial and patchy bilateral alveolar opacity. Given the long persistence of this appearance, the features probably represent a component of underlying chronic interstitial and alveolar disease. While overt pulmonary edema is considered unlikely, a component of superimposed infection cannot be entirely excluded.   Electronically Signed   By: Kennith Center M.D.   On: 05/15/2014 11:35   Results/Tests Pending at Time of Discharge: none  Discharge Medications:    Medication List    STOP taking these medications        azithromycin 500 MG tablet  Commonly known as:  ZITHROMAX     gabapentin 300 MG capsule  Commonly known as:  NEURONTIN  TAKE these medications        albuterol 108 (90 BASE) MCG/ACT inhaler  Commonly known as:  PROVENTIL HFA;VENTOLIN HFA  Inhale 2 puffs into the lungs every 4 (four) hours as needed for wheezing or shortness of breath.     azaTHIOprine 50 MG tablet  Commonly known as:  IMURAN  Take 5 tablets (250 mg total) by mouth daily.     betamethasone dipropionate 0.05 % cream  Commonly known as:   DIPROLENE  Apply topically 2 (two) times daily.     DULoxetine 20 MG capsule  Commonly known as:  CYMBALTA  Take 1 capsule (20 mg total) by mouth daily.     hydroxychloroquine 200 MG tablet  Commonly known as:  PLAQUENIL  Take 1 tablet (200 mg total) by mouth 2 (two) times daily.     hydrOXYzine 10 MG tablet  Commonly known as:  ATARAX/VISTARIL  Take 1 tablet (10 mg total) by mouth every 8 (eight) hours as needed for itching.     levofloxacin 750 MG tablet  Commonly known as:  LEVAQUIN  Take 1 tablet (750 mg total) by mouth daily.     meloxicam 15 MG tablet  Commonly known as:  MOBIC  Take 15 mg by mouth as needed for pain.     predniSONE 10 MG tablet  Commonly known as:  DELTASONE  11/23-11/29: 5 pills per day.  12/30-12/6: 4 pills per day. 12/7-12/13: 3 pills per day. 12/14-12/20: 2 pills per day. Then 1 pill per day     rivaroxaban 20 MG Tabs tablet  Commonly known as:  XARELTO  Take 1 tablet (20 mg total) by mouth daily with supper.     traMADol 50 MG tablet  Commonly known as:  ULTRAM  Take 1-2 tablets (50-100 mg total) by mouth every 8 (eight) hours as needed.        Discharge Instructions: Please refer to Patient Instructions section of EMR for full details.  Patient was counseled important signs and symptoms that should prompt return to medical care, changes in medications, dietary instructions, activity restrictions, and follow up appointments.   Follow-Up Appointments:     Follow-up Information    Follow up with Lora Paula, MD. Schedule an appointment as soon as possible for a visit in 1 week.   Specialty:  Family Medicine   Contact information:   [address] [place] [address] [phone]       Abram Sander, MD 05/18/2014, 2:30 PM PGY-2, [facility]

## 2014-05-28 ENCOUNTER — Inpatient Hospital Stay: Payer: Self-pay | Admitting: Family Medicine

## 2014-06-07 ENCOUNTER — Emergency Department (HOSPITAL_COMMUNITY): Payer: Self-pay

## 2014-06-07 ENCOUNTER — Encounter (HOSPITAL_COMMUNITY): Payer: Self-pay | Admitting: Family Medicine

## 2014-06-07 ENCOUNTER — Emergency Department (HOSPITAL_COMMUNITY)
Admission: EM | Admit: 2014-06-07 | Discharge: 2014-06-07 | Disposition: A | Discharge status: Home / Self Care | Payer: Self-pay | Attending: Emergency Medicine | Admitting: Emergency Medicine

## 2014-06-07 DIAGNOSIS — Z8709 Personal history of other diseases of the respiratory system: Secondary | ICD-10-CM | POA: Insufficient documentation

## 2014-06-07 DIAGNOSIS — Z3202 Encounter for pregnancy test, result negative: Secondary | ICD-10-CM | POA: Insufficient documentation

## 2014-06-07 DIAGNOSIS — Z79899 Other long term (current) drug therapy: Secondary | ICD-10-CM | POA: Insufficient documentation

## 2014-06-07 DIAGNOSIS — Z7901 Long term (current) use of anticoagulants: Secondary | ICD-10-CM | POA: Insufficient documentation

## 2014-06-07 DIAGNOSIS — R21 Rash and other nonspecific skin eruption: Secondary | ICD-10-CM | POA: Insufficient documentation

## 2014-06-07 DIAGNOSIS — Z86711 Personal history of pulmonary embolism: Secondary | ICD-10-CM | POA: Insufficient documentation

## 2014-06-07 DIAGNOSIS — I10 Essential (primary) hypertension: Secondary | ICD-10-CM | POA: Insufficient documentation

## 2014-06-07 DIAGNOSIS — Z872 Personal history of diseases of the skin and subcutaneous tissue: Secondary | ICD-10-CM | POA: Insufficient documentation

## 2014-06-07 DIAGNOSIS — Z8632 Personal history of gestational diabetes: Secondary | ICD-10-CM | POA: Insufficient documentation

## 2014-06-07 DIAGNOSIS — Z9889 Other specified postprocedural states: Secondary | ICD-10-CM | POA: Insufficient documentation

## 2014-06-07 DIAGNOSIS — M069 Rheumatoid arthritis, unspecified: Secondary | ICD-10-CM | POA: Insufficient documentation

## 2014-06-07 DIAGNOSIS — Z9981 Dependence on supplemental oxygen: Secondary | ICD-10-CM | POA: Insufficient documentation

## 2014-06-07 DIAGNOSIS — R0602 Shortness of breath: Secondary | ICD-10-CM | POA: Insufficient documentation

## 2014-06-07 DIAGNOSIS — R109 Unspecified abdominal pain: Secondary | ICD-10-CM

## 2014-06-07 DIAGNOSIS — Z7952 Long term (current) use of systemic steroids: Secondary | ICD-10-CM | POA: Insufficient documentation

## 2014-06-07 DIAGNOSIS — R197 Diarrhea, unspecified: Secondary | ICD-10-CM | POA: Insufficient documentation

## 2014-06-07 DIAGNOSIS — R1084 Generalized abdominal pain: Secondary | ICD-10-CM | POA: Insufficient documentation

## 2014-06-07 DIAGNOSIS — Z8701 Personal history of pneumonia (recurrent): Secondary | ICD-10-CM | POA: Insufficient documentation

## 2014-06-07 DIAGNOSIS — G8929 Other chronic pain: Secondary | ICD-10-CM | POA: Insufficient documentation

## 2014-06-07 DIAGNOSIS — E669 Obesity, unspecified: Secondary | ICD-10-CM | POA: Insufficient documentation

## 2014-06-07 DIAGNOSIS — R112 Nausea with vomiting, unspecified: Secondary | ICD-10-CM | POA: Insufficient documentation

## 2014-06-07 DIAGNOSIS — M791 Myalgia: Secondary | ICD-10-CM | POA: Insufficient documentation

## 2014-06-07 LAB — CBC WITH DIFFERENTIAL/PLATELET
BASOS ABS: 0 10*3/uL (ref 0.0–0.1)
Basophils Relative: 0 % (ref 0–1)
Eosinophils Absolute: 0.1 10*3/uL (ref 0.0–0.7)
Eosinophils Relative: 1 % (ref 0–5)
HEMATOCRIT: 50.8 % — AB (ref 36.0–46.0)
Hemoglobin: 16.5 g/dL — ABNORMAL HIGH (ref 12.0–15.0)
LYMPHS PCT: 16 % (ref 12–46)
Lymphs Abs: 1.6 10*3/uL (ref 0.7–4.0)
MCH: 29.5 pg (ref 26.0–34.0)
MCHC: 32.5 g/dL (ref 30.0–36.0)
MCV: 90.9 fL (ref 78.0–100.0)
Monocytes Absolute: 0.4 10*3/uL (ref 0.1–1.0)
Monocytes Relative: 4 % (ref 3–12)
NEUTROS ABS: 7.8 10*3/uL — AB (ref 1.7–7.7)
Neutrophils Relative %: 79 % — ABNORMAL HIGH (ref 43–77)
PLATELETS: 199 10*3/uL (ref 150–400)
RBC: 5.59 MIL/uL — ABNORMAL HIGH (ref 3.87–5.11)
RDW: 14 % (ref 11.5–15.5)
WBC: 9.9 10*3/uL (ref 4.0–10.5)

## 2014-06-07 LAB — COMPREHENSIVE METABOLIC PANEL
ALT: 22 U/L (ref 0–35)
AST: 26 U/L (ref 0–37)
Albumin: 3.8 g/dL (ref 3.5–5.2)
Alkaline Phosphatase: 88 U/L (ref 39–117)
Anion gap: 12 (ref 5–15)
BILIRUBIN TOTAL: 0.5 mg/dL (ref 0.3–1.2)
BUN: 12 mg/dL (ref 6–23)
CHLORIDE: 101 meq/L (ref 96–112)
CO2: 26 meq/L (ref 19–32)
Calcium: 8.8 mg/dL (ref 8.4–10.5)
Creatinine, Ser: 0.57 mg/dL (ref 0.50–1.10)
GFR calc Af Amer: >90 mL/min (ref >90)
GFR calc non Af Amer: >90 mL/min (ref >90)
Glucose, Bld: 88 mg/dL (ref 70–99)
Potassium: 3.8 mEq/L (ref 3.7–5.3)
Sodium: 139 mEq/L (ref 137–147)
Total Protein: 7.4 g/dL (ref 6.0–8.3)

## 2014-06-07 LAB — POC URINE PREG, ED: Preg Test, Ur: NEGATIVE

## 2014-06-07 LAB — URINALYSIS, ROUTINE W REFLEX MICROSCOPIC
GLUCOSE, UA: NEGATIVE mg/dL
Hgb urine dipstick: NEGATIVE
KETONES UR: 15 mg/dL — AB
LEUKOCYTES UA: NEGATIVE
Nitrite: NEGATIVE
PH: 5.5 (ref 5.0–8.0)
Protein, ur: NEGATIVE mg/dL
Specific Gravity, Urine: 1.028 (ref 1.005–1.030)
Urobilinogen, UA: 1 mg/dL (ref 0.0–1.0)

## 2014-06-07 LAB — LIPASE, BLOOD: Lipase: 26 U/L (ref 11–59)

## 2014-06-07 MED ORDER — ONDANSETRON HCL 4 MG/2ML IJ SOLN
4.0000 mg | Freq: Once | INTRAMUSCULAR | Status: AC
  Administered 2014-06-07: 4 mg via INTRAVENOUS
  Filled 2014-06-07: qty 2

## 2014-06-07 MED ORDER — HYDROMORPHONE HCL 1 MG/ML IJ SOLN
1.0000 mg | Freq: Once | INTRAMUSCULAR | Status: AC
Start: 2014-06-07 — End: 2014-06-07
  Administered 2014-06-07: 1 mg via INTRAVENOUS
  Filled 2014-06-07: qty 1

## 2014-06-07 MED ORDER — HYDROMORPHONE HCL 1 MG/ML IJ SOLN
1.0000 mg | Freq: Once | INTRAMUSCULAR | Status: AC
  Administered 2014-06-07: 1 mg via INTRAVENOUS
  Filled 2014-06-07: qty 1

## 2014-06-07 MED ORDER — ONDANSETRON 4 MG PO TBDP: ORAL_TABLET | ORAL | Status: DC

## 2014-06-07 MED ORDER — IOHEXOL 300 MG/ML  SOLN
100.0000 mL | Freq: Once | INTRAMUSCULAR | Status: AC | PRN
  Administered 2014-06-07: 100 mL via INTRAVENOUS

## 2014-06-07 MED ORDER — IPRATROPIUM-ALBUTEROL 0.5-2.5 (3) MG/3ML IN SOLN
3.0000 mL | Freq: Once | RESPIRATORY_TRACT | Status: AC
  Administered 2014-06-07: 3 mL via RESPIRATORY_TRACT
  Filled 2014-06-07: qty 3

## 2014-06-07 MED ORDER — SODIUM CHLORIDE 0.9 % IV BOLUS (SEPSIS)
1000.0000 mL | Freq: Once | INTRAVENOUS | Status: AC
  Administered 2014-06-07: 1000 mL via INTRAVENOUS

## 2014-06-07 MED ORDER — HYDROCODONE-ACETAMINOPHEN 5-325 MG PO TABS
1.0000 | ORAL_TABLET | Freq: Four times a day (QID) | ORAL | Status: DC | PRN
Start: 2014-06-07 — End: 2014-06-29

## 2014-06-07 MED ORDER — METOCLOPRAMIDE HCL 5 MG/ML IJ SOLN
10.0000 mg | Freq: Once | INTRAMUSCULAR | Status: AC
  Administered 2014-06-07: 10 mg via INTRAVENOUS
  Filled 2014-06-07: qty 2

## 2014-06-07 NOTE — ED Provider Notes (Signed)
CSN: [id number]     Arrival date & time 06/07/14  1706 History   First MD Initiated Contact with Patient 06/07/14 1844     Chief Complaint  Patient presents with  . Abdominal Pain  . Diarrhea  . Vomiting     (Consider location/radiation/quality/duration/timing/severity/associated sxs/prior Treatment) Patient is a 28 y.o. female presenting with abdominal pain and diarrhea. The history is provided by the patient.  Abdominal Pain Pain location:  Generalized Pain quality: bloating and cramping   Pain radiates to:  Groin Onset quality:  Gradual Timing:  Constant Progression:  Unchanged Chronicity:  New Context: not recent illness, not recent sexual activity, not sick contacts and not suspicious food intake   Associated symptoms: diarrhea, nausea and vomiting   Associated symptoms: no chest pain, no chills, no cough, no fever and no shortness of breath   Risk factors: obesity   Risk factors: no alcohol abuse, not elderly, has not had multiple surgeries and no recent hospitalization   Diarrhea Associated symptoms: abdominal pain (crampy, bloated), myalgias and vomiting   Associated symptoms: no chills and no fever     Past Medical History  Diagnosis Date  . Psoriasis 2010    as a child  . Chronic pain disorder   . Obesity   . Chronic steroid use 2010  . Lupus 2000  . Lupus (systemic lupus erythematosus) 2010  . Rheumatoid arthritis(714.0) 2010  . Gestational diabetes 2006  . Hypertension 2010  . Pneumonia "several times"  . Interstitial lung disease     /notes 05/15/2014  . On home oxygen therapy     "2L; 24/7" (05/15/2014)  . Pulmonary embolism     hx/notes 05/15/2014  . History of blood transfusion     "because white cells ate the red cells"  . Headache     "just when I have the pain from aching bones and psorasis and/or lupus symptoms" (05/15/2104)   Past Surgical History  Procedure Laterality Date  . Thigh / knee soft tissue biopsy Right 2011    thigh  .  Cesarean section  2010   Family History  Problem Relation Age of Onset  . Diabetes Mother   . Hypertension Mother   . Cancer Neg Hx   . Early death Neg Hx   . Heart disease Neg Hx    History  Substance Use Topics  . Smoking status: Never Smoker   . Smokeless tobacco: Never Used  . Alcohol Use: No   OB History    No data available     Review of Systems  Constitutional: Negative for fever and chills.  Respiratory: Negative for cough and shortness of breath.   Cardiovascular: Negative for chest pain and leg swelling.  Gastrointestinal: Positive for nausea, vomiting, abdominal pain (crampy, bloated) and diarrhea.  Musculoskeletal: Positive for myalgias.  All other systems reviewed and are negative.     Allergies  Review of patient's allergies indicates no known allergies.  Home Medications   Prior to Admission medications   Medication Sig Start Date End Date Taking? Authorizing Provider  albuterol (PROVENTIL HFA;VENTOLIN HFA) 108 (90 BASE) MCG/ACT inhaler Inhale 2 puffs into the lungs every 4 (four) hours as needed for wheezing or shortness of breath. 05/14/14  Yes Josalyn C Funches, MD  azaTHIOprine (IMURAN) 50 MG tablet Take 5 tablets (250 mg total) by mouth daily. 04/28/14  Yes Josalyn C Funches, MD  betamethasone dipropionate (DIPROLENE) 0.05 % cream Apply topically 2 (two) times daily. 05/14/14  Yes Josalyn C  Funches, MD  DULoxetine (CYMBALTA) 20 MG capsule Take 1 capsule (20 mg total) by mouth daily. 05/14/14  Yes Josalyn C Funches, MD  hydroxychloroquine (PLAQUENIL) 200 MG tablet Take 1 tablet (200 mg total) by mouth 2 (two) times daily. 05/14/14  Yes Josalyn C Funches, MD  hydrOXYzine (ATARAX/VISTARIL) 10 MG tablet Take 1 tablet (10 mg total) by mouth every 8 (eight) hours as needed for itching. 05/14/14  Yes Josalyn C Funches, MD  levofloxacin (LEVAQUIN) 750 MG tablet Take 1 tablet (750 mg total) by mouth daily. 05/17/14  Yes Abram Sander, MD  meloxicam (MOBIC) 15 MG  tablet Take 15 mg by mouth as needed for pain.    Yes Historical Provider, MD  predniSONE (DELTASONE) 10 MG tablet 11/23-11/29: 5 pills per day.  12/30-12/6: 4 pills per day. 12/7-12/13: 3 pills per day. 12/14-12/20: 2 pills per day. Then 1 pill per day 05/17/14  Yes Abram Sander, MD  rivaroxaban (XARELTO) 20 MG TABS tablet Take 1 tablet (20 mg total) by mouth daily with supper. 04/02/14  Yes Josalyn C Funches, MD   BP 120/74 mmHg  Pulse 107  Temp(Src) 98.6 F (37 C) (Oral)  Resp 22  Wt 301 lb 5 oz (136.674 kg)  SpO2 95%  LMP 06/05/2014 Physical Exam  Constitutional: She is oriented to person, place, and time. She appears well-developed and well-nourished. No distress.  HENT:  Head: Normocephalic and atraumatic.  Mouth/Throat: Oropharynx is clear and moist.  Eyes: EOM are normal. Pupils are equal, round, and reactive to light.  Neck: Normal range of motion. Neck supple.  Cardiovascular: Normal rate and regular rhythm.  Exam reveals no friction rub.   No murmur heard. Pulmonary/Chest: Effort normal and breath sounds normal. No respiratory distress. She has no wheezes. She has no rales.  Abdominal: Soft. She exhibits no distension. There is tenderness (mild, diffuse). There is no rebound.  Musculoskeletal: Normal range of motion. She exhibits no edema.  Neurological: She is alert and oriented to person, place, and time.  Skin: Skin is warm. Rash (psoriasis on her lower back) noted. She is not diaphoretic.  Nursing note and vitals reviewed.   ED Course  Procedures (including critical care time) Labs Review Labs Reviewed  CBC WITH DIFFERENTIAL - Abnormal; Notable for the following:    RBC 5.59 (*)    Hemoglobin 16.5 (*)    HCT 50.8 (*)    Neutrophils Relative % 79 (*)    Neutro Abs 7.8 (*)    All other components within normal limits  COMPREHENSIVE METABOLIC PANEL  LIPASE, BLOOD  URINALYSIS, ROUTINE W REFLEX MICROSCOPIC  PREGNANCY, URINE    Imaging Review Dg Chest 2  View  06/07/2014   CLINICAL DATA:  Shortness of breath and diffuse chest pain for 2 days.  EXAM: CHEST  2 VIEW  COMPARISON:  05/15/2014  FINDINGS: Cardiomediastinal silhouette is unchanged with the heart appearing upper limits of normal in size. Lung volumes remain diminished with persistence, mild elevation of the right hemidiaphragm. There is mild pulmonary vascular congestion. Diffuse interstitial densities are similar to the prior study with similar appearance of asymmetric, patchy left upper lobe the opacity. No definite new airspace consolidation, pleural effusion, or pneumothorax is identified. No overt alveolar pulmonary edema is definitely seen. No acute osseous abnormality is identified.  IMPRESSION: Persistent low lung volumes with similar appearance of diffuse interstitial and asymmetric left upper lobe opacities. No definite acute airspace consolidation.   Electronically Signed   By: Sebastian Ache  On: 06/07/2014 19:54   Ct Abdomen Pelvis W Contrast  06/07/2014   CLINICAL DATA:  Mid epigastric abdominal pain with nausea, vomiting, and diarrhea.  EXAM: CT ABDOMEN AND PELVIS WITH CONTRAST  TECHNIQUE: Multidetector CT imaging of the abdomen and pelvis was performed using the standard protocol following bolus administration of intravenous contrast.  CONTRAST:  OMNIPAQUE IOHEXOL 300 MG/ML  SOLN  COMPARISON:  01/05/2006  FINDINGS: Intermittent patient motion which degrades image quality and sensitivity at the affected levels.  BODY WALL: Unremarkable.  LOWER CHEST: Elevation of the right diaphragm which was also seen March 11, 2014.  ABDOMEN/PELVIS:  Liver: No focal abnormality.  Biliary: Multiple gallstones noted. There is no visible wall thickening or pericholecystic inflammation to suggest acute cholecystitis.  Pancreas: Unremarkable.  Spleen: Unremarkable.  Adrenals: Unremarkable.  Kidneys and ureters: No hydronephrosis or stone.  Bladder: Unremarkable.  Reproductive: Negative.  Bowel: No  obstruction. No inflammatory bowel wall thickening Normal appendix.  Retroperitoneum: No mass or adenopathy.  Peritoneum: No ascites or pneumoperitoneum.  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  OSSEOUS: No acute abnormalities.  IMPRESSION: 1. No acute findings to explain abdominal pain. 2. Cholelithiasis.   Electronically Signed   By: Tiburcio Pea M.D.   On: 06/07/2014 21:32     EKG Interpretation None      MDM   Final diagnoses:  Abdominal pain  SOB (shortness of breath)  Non-intractable vomiting with nausea, vomiting of unspecified type    69F here with N/V/D, began this morning. Also having some SOB, body aches. No fevers, no cough. No dysuria. No chest pain. Afebrile here, tachycardic. No murmurs appreciated. Diffuse abdominal pain. Morbidly obese, plan on scan to better evaluate her abdominal pain.  Could be flu related illness, no meningeal signs, no heart murmurs, no concerning signs. Improving with pain meds and fluids. CT negative except for cholelithiasis. No focal RUQ tenderness on exam, no other findings on CT scan to suggest cholecystitis.  CXR negative.  Eating, tolerating PO. Given zofran and vicodin. Stable for discharge. Encouraged dermatology f/u for her psoriasis.    Elwin Mocha, MD 06/07/14 [phone]

## 2014-06-07 NOTE — ED Notes (Signed)
Pt finished with contrast.

## 2014-06-07 NOTE — ED Notes (Signed)
Per pt sts 2 days of abd pain N,V,D. sts also headache.

## 2014-06-07 NOTE — Discharge Instructions (Signed)
Náuseas y Vómitos °(Nausea and Vomiting) °La náusea es la sensación de malestar en el estómago o de la necesidad de vomitar. El vómito es un reflejo por el que los contenidos del estómago salen por la boca. El vómito puede ocasionar pérdida de líquidos del organismo (deshidratación). Los niños y los adultos mayores pueden deshidratarse rápidamente (en especial si también tienen diarrea). Las náuseas y los vómitos son síntoma de un trastorno o enfermedad. Es importante averiguar la causa de los síntomas. °CAUSAS °· Irritación directa de la membrana que cubre el estómago. Esta irritación puede ser resultado del aumento de la producción de ácido, (reflujo gastroesofágico), infecciones, intoxicación alimentaria, ciertos medicamentos (como antinflamatorios no esteroideos), consumo de alcohol o de tabaco. °· Señales del cerebro. Estas señales pueden ser un dolor de cabeza, exposición al calor, trastornos del oído interno, aumento de la presión en el cerebro por lesiones, infección, un tumor o conmoción cerebral, estímulos emocionales o problemas metabólicos. °· Una obstrucción en el tracto gastrointestinal (obstrucción intestinal). °· Ciertas enfermedades como la diabetes, problemas en la vesícula biliar, apendicitis, problemas renales, cáncer, sepsis, síntomas atípicos de infarto o trastornos alimentarios. °· Tratamientos médicos como la quimioterapia y la radiación. °· Medicamentos que inducen al sueño (anestesia general) durante una cirugía. °DIAGNÓSTICO  °El médico podrá solicitarle algunos análisis si los problemas no mejoran luego de algunos días. También podrán pedirle análisis si los síntomas son graves o si el motivo de los vómitos o las náuseas no está claro. Los análisis pueden ser:  °· Análisis de orina. °· Análisis de sangre. °· Pruebas de materia fecal. °· Cultivos (para buscar evidencias de infección). °· Radiografías u otros estudios por imágenes. °Los resultados de las pruebas lo ayudarán al médico a  tomar decisiones acerca del mejor curso de tratamiento o la necesidad de análisis adicionales.  °TRATAMIENTO  °Debe estar bien hidratado. Beba con frecuencia pequeñas cantidades de líquido. Puede beber agua, bebidas deportivas, caldos claros o comer pequeños trocitos de hielo o gelatina para mantenerse hidratado. Cuando coma, hágalo lentamente para evitar las náuseas. Hay medicamentos para evitar las náuseas que pueden aliviarlo.  °INSTRUCCIONES PARA EL CUIDADO DOMICILIARIO °· Si su médico le prescribe medicamentos tómelos como se le haya indicado. °· Si no tiene hambre, no se fuerce a comer. Sin embargo, es necesario que tome líquidos. °· Si tiene hambre aliméntese con una dieta normal, a menos que el médico le indique otra cosa. °¨ Los mejores alimentos son una combinación de carbohidratos complejos (arroz, trigo, papas, pan), carnes magras, yogur, frutas y vegetales. °¨ Evite los alimentos ricos en grasas porque dificultan la digestión. °· Beba gran cantidad de líquido para mantener la orina de tono claro o color amarillo pálido. °· Si está deshidratado, consulte a su médico para que le dé instrucciones específicas para volver a hidratarlo. Los signos de deshidratación son: °¨ Mucha sed. °¨ Labios y boca secos. °¨ Mareos. °¨ Orina oscura. °¨ Disminución de la frecuencia y cantidad de la orina. °¨ Confusión. °¨ Tiene el pulso o la respiración acelerados. °SOLICITE ATENCIÓN MÉDICA DE INMEDIATO SI: °· Vomita sangre o algo similar a la borra del café. °· La materia fecal (heces) es negra o tiene sangre. °· Sufre una cefalea grave o rigidez en el cuello. °· Se siente confundido. °· Siente dolor abdominal intenso. °· Tiene dolor en el pecho o dificultad para respirar. °· No orina por 8 horas. °· Tiene la piel fría y pegajosa. °· Sigue vomitando durante más de 24 a 48 horas. °· Tiene fiebre. °ASEGÚRESE QUE:  °· Comprende   estas instrucciones. °· Controlará su enfermedad. °· Solicitará ayuda inmediatamente si no mejora o  si empeora. °Document Released: 07/02/2007 Document Revised: 09/04/2011 °[employer]® Patient Information ©2015 [employer]. This information is not intended to replace advice given to you by your health care provider. Make sure you discuss any questions you have with your health care provider. ° °

## 2014-06-08 ENCOUNTER — Ambulatory Visit: Payer: MEDICAID | Attending: Family Medicine

## 2014-06-20 ENCOUNTER — Inpatient Hospital Stay (HOSPITAL_COMMUNITY)
Admission: EM | Admit: 2014-06-20 | Discharge: 2014-06-21 | DRG: 202 | Disposition: A | Discharge status: Home / Self Care | Payer: Medicaid Other | Attending: Internal Medicine | Admitting: Internal Medicine

## 2014-06-20 ENCOUNTER — Other Ambulatory Visit: Payer: Self-pay

## 2014-06-20 ENCOUNTER — Encounter (HOSPITAL_COMMUNITY): Payer: Self-pay | Admitting: Emergency Medicine

## 2014-06-20 ENCOUNTER — Emergency Department (HOSPITAL_COMMUNITY): Payer: Medicaid Other

## 2014-06-20 DIAGNOSIS — M069 Rheumatoid arthritis, unspecified: Secondary | ICD-10-CM | POA: Diagnosis present

## 2014-06-20 DIAGNOSIS — F419 Anxiety disorder, unspecified: Secondary | ICD-10-CM | POA: Diagnosis present

## 2014-06-20 DIAGNOSIS — F329 Major depressive disorder, single episode, unspecified: Secondary | ICD-10-CM | POA: Diagnosis present

## 2014-06-20 DIAGNOSIS — J849 Interstitial pulmonary disease, unspecified: Secondary | ICD-10-CM | POA: Diagnosis present

## 2014-06-20 DIAGNOSIS — Z9981 Dependence on supplemental oxygen: Secondary | ICD-10-CM | POA: Diagnosis not present

## 2014-06-20 DIAGNOSIS — L409 Psoriasis, unspecified: Secondary | ICD-10-CM | POA: Diagnosis present

## 2014-06-20 DIAGNOSIS — E66813 Obesity, class 3: Secondary | ICD-10-CM | POA: Diagnosis present

## 2014-06-20 DIAGNOSIS — M329 Systemic lupus erythematosus, unspecified: Secondary | ICD-10-CM | POA: Diagnosis present

## 2014-06-20 DIAGNOSIS — G8929 Other chronic pain: Secondary | ICD-10-CM | POA: Diagnosis present

## 2014-06-20 DIAGNOSIS — J45901 Unspecified asthma with (acute) exacerbation: Secondary | ICD-10-CM

## 2014-06-20 DIAGNOSIS — Z6841 Body Mass Index (BMI) 40.0 and over, adult: Secondary | ICD-10-CM | POA: Diagnosis not present

## 2014-06-20 DIAGNOSIS — Z8249 Family history of ischemic heart disease and other diseases of the circulatory system: Secondary | ICD-10-CM | POA: Diagnosis not present

## 2014-06-20 DIAGNOSIS — Z833 Family history of diabetes mellitus: Secondary | ICD-10-CM | POA: Diagnosis not present

## 2014-06-20 DIAGNOSIS — R079 Chest pain, unspecified: Secondary | ICD-10-CM | POA: Diagnosis present

## 2014-06-20 DIAGNOSIS — I10 Essential (primary) hypertension: Secondary | ICD-10-CM | POA: Diagnosis present

## 2014-06-20 DIAGNOSIS — Z7901 Long term (current) use of anticoagulants: Secondary | ICD-10-CM

## 2014-06-20 DIAGNOSIS — J8410 Pulmonary fibrosis, unspecified: Secondary | ICD-10-CM | POA: Diagnosis present

## 2014-06-20 DIAGNOSIS — F32A Depression, unspecified: Secondary | ICD-10-CM | POA: Diagnosis present

## 2014-06-20 DIAGNOSIS — I2699 Other pulmonary embolism without acute cor pulmonale: Secondary | ICD-10-CM

## 2014-06-20 LAB — CBC
HEMATOCRIT: 47.1 % — AB (ref 36.0–46.0)
Hemoglobin: 15.6 g/dL — ABNORMAL HIGH (ref 12.0–15.0)
MCH: 30.4 pg (ref 26.0–34.0)
MCHC: 33.1 g/dL (ref 30.0–36.0)
MCV: 91.6 fL (ref 78.0–100.0)
Platelets: 217 10*3/uL (ref 150–400)
RBC: 5.14 MIL/uL — AB (ref 3.87–5.11)
RDW: 13.7 % (ref 11.5–15.5)
WBC: 10.1 10*3/uL (ref 4.0–10.5)

## 2014-06-20 LAB — BASIC METABOLIC PANEL
Anion gap: 8 (ref 5–15)
BUN: 19 mg/dL (ref 6–23)
CHLORIDE: 108 meq/L (ref 96–112)
CO2: 23 mmol/L (ref 19–32)
CREATININE: 0.68 mg/dL (ref 0.50–1.10)
Calcium: 8.5 mg/dL (ref 8.4–10.5)
GFR calc Af Amer: >90 mL/min (ref >90)
GFR calc non Af Amer: >90 mL/min (ref >90)
Glucose, Bld: 171 mg/dL — ABNORMAL HIGH (ref 70–99)
Potassium: 4 mmol/L (ref 3.5–5.1)
Sodium: 139 mmol/L (ref 135–145)

## 2014-06-20 LAB — I-STAT TROPONIN, ED: Troponin i, poc: 0.01 ng/mL (ref 0.00–0.08)

## 2014-06-20 LAB — BRAIN NATRIURETIC PEPTIDE: B Natriuretic Peptide: 20.6 pg/mL (ref 0.0–100.0)

## 2014-06-20 MED ORDER — IPRATROPIUM-ALBUTEROL 0.5-2.5 (3) MG/3ML IN SOLN
3.0000 mL | Freq: Once | RESPIRATORY_TRACT | Status: AC
  Administered 2014-06-20: 3 mL via RESPIRATORY_TRACT
  Filled 2014-06-20: qty 3

## 2014-06-20 MED ORDER — METHYLPREDNISOLONE SODIUM SUCC 125 MG IJ SOLR
125.0000 mg | Freq: Once | INTRAMUSCULAR | Status: AC
  Administered 2014-06-21: 125 mg via INTRAVENOUS
  Filled 2014-06-20: qty 2

## 2014-06-20 MED ORDER — ALBUTEROL SULFATE (2.5 MG/3ML) 0.083% IN NEBU
5.0000 mg | INHALATION_SOLUTION | Freq: Once | RESPIRATORY_TRACT | Status: AC
  Administered 2014-06-20: 5 mg via RESPIRATORY_TRACT
  Filled 2014-06-20: qty 6

## 2014-06-20 NOTE — ED Provider Notes (Signed)
CSN: [id number]     Arrival date & time 06/20/14  2055 History  This chart was scribed for Tilden Fossa, MD by Bronson Curb, ED Scribe. This patient was seen in room A12C/A12C and the patient's care was started at 11:49 PM.     Chief Complaint  Patient presents with  . Chest Pain    The history is provided by the patient. A language interpreter was used.     HPI Comments: Tewana Bohlen is a 28 y.o. female, with history of PE/DVT, lupus, psoriasis, and HTN, who presents to the Emergency Department complaining of substernal chest pain onset this yesterday. Patient states she "feels tight". There is associated SOB, cough, HA, dizziness, bilateral leg swelling. She denies fever or history of prior similar episodes. Patient states she is currently on Xarelto.    Past Medical History  Diagnosis Date  . Psoriasis 2010    as a child  . Chronic pain disorder   . Obesity   . Chronic steroid use 2010  . Lupus 2000  . Lupus (systemic lupus erythematosus) 2010  . Rheumatoid arthritis(714.0) 2010  . Gestational diabetes 2006  . Hypertension 2010  . Pneumonia "several times"  . Interstitial lung disease     /notes 05/15/2014  . On home oxygen therapy     "2L; 24/7" (05/15/2014)  . Pulmonary embolism     hx/notes 05/15/2014  . History of blood transfusion     "because white cells ate the red cells"  . Headache     "just when I have the pain from aching bones and psorasis and/or lupus symptoms" (05/15/2104)   Past Surgical History  Procedure Laterality Date  . Thigh / knee soft tissue biopsy Right 2011    thigh  . Cesarean section  2010   Family History  Problem Relation Age of Onset  . Diabetes Mother   . Hypertension Mother   . Cancer Neg Hx   . Early death Neg Hx   . Heart disease Neg Hx    History  Substance Use Topics  . Smoking status: Never Smoker   . Smokeless tobacco: Never Used  . Alcohol Use: No   OB History    No data available     Review of  Systems  Constitutional: Negative for fever.  Respiratory: Positive for cough, chest tightness and shortness of breath.   Cardiovascular: Positive for chest pain.  Neurological: Positive for dizziness and headaches.  All other systems reviewed and are negative.     Allergies  Review of patient's allergies indicates no known allergies.  Home Medications   Prior to Admission medications   Medication Sig Start Date End Date Taking? Authorizing Provider  albuterol (PROVENTIL HFA;VENTOLIN HFA) 108 (90 BASE) MCG/ACT inhaler Inhale 2 puffs into the lungs every 4 (four) hours as needed for wheezing or shortness of breath. 05/14/14   Lora Paula, MD  azaTHIOprine (IMURAN) 50 MG tablet Take 5 tablets (250 mg total) by mouth daily. 04/28/14   Josalyn C Funches, MD  betamethasone dipropionate (DIPROLENE) 0.05 % cream Apply topically 2 (two) times daily. 05/14/14   Josalyn C Funches, MD  DULoxetine (CYMBALTA) 20 MG capsule Take 1 capsule (20 mg total) by mouth daily. 05/14/14   Lora Paula, MD  HYDROcodone-acetaminophen (NORCO/VICODIN) 5-325 MG per tablet Take 1 tablet by mouth every 6 (six) hours as needed for moderate pain. 06/07/14   Elwin Mocha, MD  hydroxychloroquine (PLAQUENIL) 200 MG tablet Take 1 tablet (200 mg total)  by mouth 2 (two) times daily. 05/14/14   Lora Paula, MD  hydrOXYzine (ATARAX/VISTARIL) 10 MG tablet Take 1 tablet (10 mg total) by mouth every 8 (eight) hours as needed for itching. 05/14/14   Lora Paula, MD  levofloxacin (LEVAQUIN) 750 MG tablet Take 1 tablet (750 mg total) by mouth daily. 05/17/14   Abram Sander, MD  meloxicam (MOBIC) 15 MG tablet Take 15 mg by mouth as needed for pain.     Historical Provider, MD  ondansetron (ZOFRAN ODT) 4 MG disintegrating tablet 1 tab sublingual q6h PRN nausea/vomiting 06/07/14   Elwin Mocha, MD  predniSONE (DELTASONE) 10 MG tablet 11/23-11/29: 5 pills per day.  12/30-12/6: 4 pills per day. 12/7-12/13: 3 pills per  day. 12/14-12/20: 2 pills per day. Then 1 pill per day 05/17/14   Abram Sander, MD  rivaroxaban (XARELTO) 20 MG TABS tablet Take 1 tablet (20 mg total) by mouth daily with supper. 04/02/14   Lora Paula, MD   Triage Vitals: BP 128/98 mmHg  Pulse 122  Temp(Src) 98.5 F (36.9 C) (Oral)  Ht 5\' 7"  (1.702 m)  Wt 315 lb (142.883 kg)  BMI 49.32 kg/m2  SpO2 94%  LMP 06/05/2014  Physical Exam  Constitutional: She is oriented to person, place, and time. She appears well-developed and well-nourished.  Moderate distress  HENT:  Head: Normocephalic and atraumatic.  Cardiovascular: Regular rhythm.   No murmur heard. tachycardic  Pulmonary/Chest:  Tachypneic, diffuse wheezes bilaterally  Abdominal: Soft. There is no tenderness. There is no rebound and no guarding.  Musculoskeletal: She exhibits no edema or tenderness.  Neurological: She is alert and oriented to person, place, and time.  Skin: Skin is warm and dry.  Psychiatric: She has a normal mood and affect. Her behavior is normal.  Nursing note and vitals reviewed.   ED Course  Procedures (including critical care time)  DIAGNOSTIC STUDIES: Oxygen Saturation is 94% on room air, adequate by my interpretation.    COORDINATION OF CARE: At 2354 Discussed treatment plan with patient. Patient agrees.   Labs Review Labs Reviewed  CBC - Abnormal; Notable for the following:    RBC 5.14 (*)    Hemoglobin 15.6 (*)    HCT 47.1 (*)    All other components within normal limits  BASIC METABOLIC PANEL - Abnormal; Notable for the following:    Glucose, Bld 171 (*)    All other components within normal limits  BASIC METABOLIC PANEL - Abnormal; Notable for the following:    Glucose, Bld 115 (*)    All other components within normal limits  CBC - Abnormal; Notable for the following:    WBC 10.7 (*)    Hemoglobin 15.5 (*)    HCT 47.0 (*)    All other components within normal limits  CULTURE, EXPECTORATED SPUTUM-ASSESSMENT  BRAIN  NATRIURETIC PEPTIDE  CBC WITH DIFFERENTIAL  APTT  PROTIME-INR  COMPREHENSIVE METABOLIC PANEL  INFLUENZA PANEL BY PCR (TYPE A & B, H1N1)  LEGIONELLA ANTIGEN, URINE  STREP PNEUMONIAE URINARY ANTIGEN  I-STAT TROPOININ, ED  I-STAT TROPOININ, ED  I-STAT ARTERIAL BLOOD GAS, ED    Imaging Review Dg Chest 2 View  06/20/2014   CLINICAL DATA:  Chest pain and short of breath. History of pulmonary embolism  EXAM: CHEST  2 VIEW  COMPARISON:  Radiograph 06/07/2014  FINDINGS: Normal cardiac silhouette. There is mild interstitial edema pattern. There are low lung volumes. Chronic elevation of right hemidiaphragm. No overt pulmonary edema. No pneumothorax.  IMPRESSION: Interstitial edema better similar to prior.  Chronic elevation right hemidiaphragm.   Electronically Signed   By: Genevive Bi M.D.   On: 06/20/2014 23:49     EKG Interpretation   Date/Time:  Sunday June 21 2014 00:08:15 EST Ventricular Rate:  110 PR Interval:  124 QRS Duration: 86 QT Interval:  358 QTC Calculation: 484 R Axis:   72 Text Interpretation:  Sinus tachycardia Borderline prolonged QT interval  Confirmed by Lincoln Brigham [phone]) on 06/21/2014 12:27:48 AM      MDM   Final diagnoses:  Acute asthma exacerbation, unspecified asthma severity    Patient here for cough and shortness of breath, patient was wheezing on exam. Patient has minimal improvement with nebulizer treatments in the emergency department. There is no evidence of acute infection at this time. Concern for severe asthma exacerbation. The patient already anticoagulated, feel recurrent PE unlikely. Discussed with patient recommendation for admission for continued breathing treatments and patient is in agreement. Discussed with medicine regarding admission for acute asthma exacerbation.  I personally performed the services described in this documentation, which was scribed in my presence. The recorded information has been reviewed and is  accurate.    Tilden Fossa, MD 06/21/14 [phone]

## 2014-06-20 NOTE — ED Notes (Signed)
Pt. reports mid chest pain with SOB , headache and dizziness onset this morning , denies nausea or diaphoresis .

## 2014-06-21 DIAGNOSIS — G8929 Other chronic pain: Secondary | ICD-10-CM | POA: Diagnosis present

## 2014-06-21 DIAGNOSIS — I2699 Other pulmonary embolism without acute cor pulmonale: Secondary | ICD-10-CM

## 2014-06-21 DIAGNOSIS — I10 Essential (primary) hypertension: Secondary | ICD-10-CM | POA: Diagnosis present

## 2014-06-21 DIAGNOSIS — M329 Systemic lupus erythematosus, unspecified: Secondary | ICD-10-CM | POA: Diagnosis present

## 2014-06-21 DIAGNOSIS — F418 Other specified anxiety disorders: Secondary | ICD-10-CM

## 2014-06-21 DIAGNOSIS — F329 Major depressive disorder, single episode, unspecified: Secondary | ICD-10-CM | POA: Diagnosis present

## 2014-06-21 DIAGNOSIS — J45901 Unspecified asthma with (acute) exacerbation: Secondary | ICD-10-CM | POA: Diagnosis present

## 2014-06-21 DIAGNOSIS — Z6841 Body Mass Index (BMI) 40.0 and over, adult: Secondary | ICD-10-CM | POA: Diagnosis not present

## 2014-06-21 DIAGNOSIS — J8410 Pulmonary fibrosis, unspecified: Secondary | ICD-10-CM

## 2014-06-21 DIAGNOSIS — Z7901 Long term (current) use of anticoagulants: Secondary | ICD-10-CM | POA: Diagnosis not present

## 2014-06-21 DIAGNOSIS — F419 Anxiety disorder, unspecified: Secondary | ICD-10-CM | POA: Diagnosis present

## 2014-06-21 DIAGNOSIS — Z833 Family history of diabetes mellitus: Secondary | ICD-10-CM | POA: Diagnosis not present

## 2014-06-21 DIAGNOSIS — Z9981 Dependence on supplemental oxygen: Secondary | ICD-10-CM | POA: Diagnosis not present

## 2014-06-21 DIAGNOSIS — J849 Interstitial pulmonary disease, unspecified: Secondary | ICD-10-CM | POA: Diagnosis present

## 2014-06-21 DIAGNOSIS — M069 Rheumatoid arthritis, unspecified: Secondary | ICD-10-CM | POA: Diagnosis present

## 2014-06-21 DIAGNOSIS — L409 Psoriasis, unspecified: Secondary | ICD-10-CM | POA: Diagnosis present

## 2014-06-21 DIAGNOSIS — Z8249 Family history of ischemic heart disease and other diseases of the circulatory system: Secondary | ICD-10-CM | POA: Diagnosis not present

## 2014-06-21 DIAGNOSIS — R079 Chest pain, unspecified: Secondary | ICD-10-CM

## 2014-06-21 LAB — COMPREHENSIVE METABOLIC PANEL
ALK PHOS: 70 U/L (ref 39–117)
ALT: 21 U/L (ref 0–35)
AST: 33 U/L (ref 0–37)
Albumin: 3.5 g/dL (ref 3.5–5.2)
Anion gap: 9 (ref 5–15)
BILIRUBIN TOTAL: 0.2 mg/dL — AB (ref 0.3–1.2)
BUN: 16 mg/dL (ref 6–23)
CHLORIDE: 107 meq/L (ref 96–112)
CO2: 25 mmol/L (ref 19–32)
Calcium: 8.5 mg/dL (ref 8.4–10.5)
Creatinine, Ser: 0.67 mg/dL (ref 0.50–1.10)
GFR calc Af Amer: >90 mL/min (ref >90)
GFR calc non Af Amer: >90 mL/min (ref >90)
Glucose, Bld: 199 mg/dL — ABNORMAL HIGH (ref 70–99)
POTASSIUM: 4.2 mmol/L (ref 3.5–5.1)
Sodium: 141 mmol/L (ref 135–145)
Total Protein: 6.4 g/dL (ref 6.0–8.3)

## 2014-06-21 LAB — CBC
HCT: 47 % — ABNORMAL HIGH (ref 36.0–46.0)
Hemoglobin: 15.5 g/dL — ABNORMAL HIGH (ref 12.0–15.0)
MCH: 30.3 pg (ref 26.0–34.0)
MCHC: 33 g/dL (ref 30.0–36.0)
MCV: 92 fL (ref 78.0–100.0)
Platelets: 235 10*3/uL (ref 150–400)
RBC: 5.11 MIL/uL (ref 3.87–5.11)
RDW: 13.7 % (ref 11.5–15.5)
WBC: 10.7 10*3/uL — AB (ref 4.0–10.5)

## 2014-06-21 LAB — CBC WITH DIFFERENTIAL/PLATELET
Basophils Absolute: 0 10*3/uL (ref 0.0–0.1)
Basophils Relative: 0 % (ref 0–1)
Eosinophils Absolute: 0 10*3/uL (ref 0.0–0.7)
Eosinophils Relative: 0 % (ref 0–5)
HEMATOCRIT: 44.9 % (ref 36.0–46.0)
Hemoglobin: 14.3 g/dL (ref 12.0–15.0)
Lymphocytes Relative: 3 % — ABNORMAL LOW (ref 12–46)
Lymphs Abs: 0.2 10*3/uL — ABNORMAL LOW (ref 0.7–4.0)
MCH: 29 pg (ref 26.0–34.0)
MCHC: 31.8 g/dL (ref 30.0–36.0)
MCV: 91.1 fL (ref 78.0–100.0)
MONOS PCT: 1 % — AB (ref 3–12)
Monocytes Absolute: 0.1 10*3/uL (ref 0.1–1.0)
NEUTROS ABS: 7.8 10*3/uL — AB (ref 1.7–7.7)
NEUTROS PCT: 96 % — AB (ref 43–77)
Platelets: 209 10*3/uL (ref 150–400)
RBC: 4.93 MIL/uL (ref 3.87–5.11)
RDW: 13.8 % (ref 11.5–15.5)
WBC: 8.1 10*3/uL (ref 4.0–10.5)

## 2014-06-21 LAB — BASIC METABOLIC PANEL
ANION GAP: 8 (ref 5–15)
BUN: 18 mg/dL (ref 6–23)
CHLORIDE: 108 meq/L (ref 96–112)
CO2: 25 mmol/L (ref 19–32)
Calcium: 8.5 mg/dL (ref 8.4–10.5)
Creatinine, Ser: 0.66 mg/dL (ref 0.50–1.10)
GFR calc Af Amer: >90 mL/min (ref >90)
GFR calc non Af Amer: >90 mL/min (ref >90)
Glucose, Bld: 115 mg/dL — ABNORMAL HIGH (ref 70–99)
POTASSIUM: 4.4 mmol/L (ref 3.5–5.1)
Sodium: 141 mmol/L (ref 135–145)

## 2014-06-21 LAB — INFLUENZA PANEL BY PCR (TYPE A & B)
H1N1 flu by pcr: NOT DETECTED
Influenza A By PCR: NEGATIVE
Influenza B By PCR: NEGATIVE

## 2014-06-21 LAB — APTT: aPTT: 25 seconds (ref 24–37)

## 2014-06-21 LAB — TROPONIN I: Troponin I: <0.03 ng/mL (ref <0.031)

## 2014-06-21 LAB — I-STAT TROPONIN, ED: Troponin i, poc: 0 ng/mL (ref 0.00–0.08)

## 2014-06-21 LAB — PROTIME-INR
INR: 1.09 (ref 0.00–1.49)
PROTHROMBIN TIME: 14.3 s (ref 11.6–15.2)

## 2014-06-21 MED ORDER — RIVAROXABAN 20 MG PO TABS
20.0000 mg | ORAL_TABLET | Freq: Every day | ORAL | Status: DC
  Administered 2014-06-21: 20 mg via ORAL
  Filled 2014-06-21: qty 1

## 2014-06-21 MED ORDER — KETOROLAC TROMETHAMINE 30 MG/ML IJ SOLN
30.0000 mg | Freq: Once | INTRAMUSCULAR | Status: AC
  Administered 2014-06-21: 30 mg via INTRAVENOUS
  Filled 2014-06-21: qty 1

## 2014-06-21 MED ORDER — IPRATROPIUM-ALBUTEROL 0.5-2.5 (3) MG/3ML IN SOLN
3.0000 mL | RESPIRATORY_TRACT | Status: DC
  Administered 2014-06-21 (×3): 3 mL via RESPIRATORY_TRACT
  Filled 2014-06-21 (×3): qty 3

## 2014-06-21 MED ORDER — TRIAMCINOLONE ACETONIDE 0.5 % EX CREA
TOPICAL_CREAM | Freq: Two times a day (BID) | CUTANEOUS | Status: DC
  Administered 2014-06-21: 14:00:00 via TOPICAL
  Filled 2014-06-21 (×2): qty 15

## 2014-06-21 MED ORDER — HYDROCODONE-ACETAMINOPHEN 5-325 MG PO TABS
1.0000 | ORAL_TABLET | Freq: Four times a day (QID) | ORAL | Status: DC | PRN
  Administered 2014-06-21: 1 via ORAL
  Filled 2014-06-21: qty 1

## 2014-06-21 MED ORDER — DULOXETINE HCL 20 MG PO CPEP
20.0000 mg | ORAL_CAPSULE | Freq: Every day | ORAL | Status: DC
  Administered 2014-06-21: 20 mg via ORAL
  Filled 2014-06-21: qty 1

## 2014-06-21 MED ORDER — DOXYCYCLINE HYCLATE 100 MG PO CAPS: 100.0000 mg | ORAL_CAPSULE | Freq: Two times a day (BID) | ORAL | Status: DC

## 2014-06-21 MED ORDER — IPRATROPIUM BROMIDE 0.02 % IN SOLN
0.5000 mg | Freq: Once | RESPIRATORY_TRACT | Status: AC
  Administered 2014-06-21: 0.5 mg via RESPIRATORY_TRACT
  Filled 2014-06-21: qty 2.5

## 2014-06-21 MED ORDER — ONDANSETRON HCL 4 MG PO TABS: 4.0000 mg | ORAL_TABLET | Freq: Four times a day (QID) | ORAL | Status: DC | PRN

## 2014-06-21 MED ORDER — TRAMADOL HCL 50 MG PO TABS: 50.0000 mg | ORAL_TABLET | Freq: Three times a day (TID) | ORAL | Status: DC | PRN

## 2014-06-21 MED ORDER — ALBUTEROL SULFATE (2.5 MG/3ML) 0.083% IN NEBU: 2.5000 mg | INHALATION_SOLUTION | RESPIRATORY_TRACT | Status: DC | PRN

## 2014-06-21 MED ORDER — SODIUM CHLORIDE 0.9 % IV SOLN
Freq: Once | INTRAVENOUS | Status: AC
  Administered 2014-06-21: 04:00:00 via INTRAVENOUS

## 2014-06-21 MED ORDER — OXYCODONE HCL 5 MG PO TABS
5.0000 mg | ORAL_TABLET | Freq: Four times a day (QID) | ORAL | Status: DC | PRN
  Administered 2014-06-21: 5 mg via ORAL
  Filled 2014-06-21: qty 1

## 2014-06-21 MED ORDER — HYDROCODONE-ACETAMINOPHEN 5-325 MG PO TABS
1.0000 | ORAL_TABLET | Freq: Once | ORAL | Status: AC
  Administered 2014-06-21: 1 via ORAL
  Filled 2014-06-21: qty 1

## 2014-06-21 MED ORDER — LEVOFLOXACIN IN D5W 750 MG/150ML IV SOLN
750.0000 mg | Freq: Every day | INTRAVENOUS | Status: DC
  Administered 2014-06-21: 750 mg via INTRAVENOUS
  Filled 2014-06-21: qty 150

## 2014-06-21 MED ORDER — HYDROXYZINE HCL 10 MG PO TABS
10.0000 mg | ORAL_TABLET | Freq: Three times a day (TID) | ORAL | Status: DC | PRN
  Filled 2014-06-21: qty 1

## 2014-06-21 MED ORDER — ALBUTEROL SULFATE (2.5 MG/3ML) 0.083% IN NEBU
5.0000 mg | INHALATION_SOLUTION | Freq: Once | RESPIRATORY_TRACT | Status: AC
  Administered 2014-06-21: 5 mg via RESPIRATORY_TRACT
  Filled 2014-06-21: qty 6

## 2014-06-21 MED ORDER — HYDROXYCHLOROQUINE SULFATE 200 MG PO TABS
200.0000 mg | ORAL_TABLET | Freq: Two times a day (BID) | ORAL | Status: DC
  Administered 2014-06-21: 200 mg via ORAL
  Filled 2014-06-21 (×2): qty 1

## 2014-06-21 MED ORDER — SODIUM CHLORIDE 0.9 % IJ SOLN
3.0000 mL | Freq: Two times a day (BID) | INTRAMUSCULAR | Status: DC
  Administered 2014-06-21: 3 mL via INTRAVENOUS

## 2014-06-21 MED ORDER — ACETAMINOPHEN 325 MG PO TABS: 650.0000 mg | ORAL_TABLET | Freq: Four times a day (QID) | ORAL | Status: DC | PRN

## 2014-06-21 MED ORDER — INFLUENZA VAC SPLIT QUAD 0.5 ML IM SUSY: 0.5000 mL | PREFILLED_SYRINGE | INTRAMUSCULAR | Status: DC

## 2014-06-21 MED ORDER — ACETAMINOPHEN 650 MG RE SUPP: 650.0000 mg | Freq: Four times a day (QID) | RECTAL | Status: DC | PRN

## 2014-06-21 MED ORDER — ONDANSETRON HCL 4 MG/2ML IJ SOLN: 4.0000 mg | Freq: Four times a day (QID) | INTRAMUSCULAR | Status: DC | PRN

## 2014-06-21 MED ORDER — METHYLPREDNISOLONE SODIUM SUCC 125 MG IJ SOLR
60.0000 mg | Freq: Four times a day (QID) | INTRAMUSCULAR | Status: DC
  Administered 2014-06-21 (×2): 60 mg via INTRAVENOUS
  Filled 2014-06-21 (×5): qty 0.96

## 2014-06-21 MED ORDER — ALBUTEROL SULFATE HFA 108 (90 BASE) MCG/ACT IN AERS: 2.0000 | INHALATION_SPRAY | RESPIRATORY_TRACT | Status: DC | PRN

## 2014-06-21 MED ORDER — OXYCODONE-ACETAMINOPHEN 5-325 MG PO TABS
1.0000 | ORAL_TABLET | Freq: Four times a day (QID) | ORAL | Status: DC | PRN
  Administered 2014-06-21: 2 via ORAL
  Filled 2014-06-21: qty 2

## 2014-06-21 MED ORDER — ENOXAPARIN SODIUM 80 MG/0.8ML ~~LOC~~ SOLN
80.0000 mg | Freq: Every day | SUBCUTANEOUS | Status: DC
  Filled 2014-06-21: qty 0.8

## 2014-06-21 MED ORDER — ALBUTEROL (5 MG/ML) CONTINUOUS INHALATION SOLN
10.0000 mg/h | INHALATION_SOLUTION | RESPIRATORY_TRACT | Status: DC
  Administered 2014-06-21: 10 mg/h via RESPIRATORY_TRACT
  Filled 2014-06-21: qty 20

## 2014-06-21 MED ORDER — PREDNISONE 10 MG PO TABS: ORAL_TABLET | ORAL | Status: DC

## 2014-06-21 NOTE — Progress Notes (Signed)
Pt AVS printed in spanish; pt able to read and voices understanding; Pt discharge education and instructions completed with pt; pt denies any questions. Pt IV and telemetry removed; pt discharge home with friend to give her ride home. Pt handed her prescription for doxycycline and prednisone. Pt transported off unit via wheelchair with belongings to the side. Arabella Merles Javonn Gauger RN.

## 2014-06-21 NOTE — Progress Notes (Signed)
ANTICOAGULATION CONSULT NOTE - Initial Consult  Pharmacy Consult for Xarelto Indication: pulmonary embolus  No Known Allergies  Patient Measurements: Height: 5\' 7"  (170.2 cm) Weight: (!) 350 lb 15.6 oz (159.2 kg) IBW/kg (Calculated) : 61.6  Vital Signs: Temp: 98.5 F (36.9 C) (12/26 2111) Temp Source: Oral (12/26 2111) BP: 112/44 mmHg (12/27 0642) Pulse Rate: 129 (12/27 0642)  Labs:  Recent Labs  06/20/14 2116 06/20/14 2359  HGB 15.6* 15.5*  HCT 47.1* 47.0*  PLT 217 235  CREATININE 0.68 0.66    Estimated Creatinine Clearance: 164.8 mL/min (by C-G formula based on Cr of 0.66).   Medical History: Past Medical History  Diagnosis Date  . Psoriasis 2010    as a child  . Chronic pain disorder   . Obesity   . Chronic steroid use 2010  . Lupus 2000  . Lupus (systemic lupus erythematosus) 2010  . Rheumatoid arthritis(714.0) 2010  . Gestational diabetes 2006  . Hypertension 2010  . Pneumonia "several times"  . Interstitial lung disease     /notes 05/15/2014  . On home oxygen therapy     "2L; 24/7" (05/15/2014)  . Pulmonary embolism     hx/notes 05/15/2014  . History of blood transfusion     "because white cells ate the red cells"  . Headache     "just when I have the pain from aching bones and psorasis and/or lupus symptoms" (05/15/2104)    Medications:  Prescriptions prior to admission  Medication Sig Dispense Refill Last Dose  . albuterol (PROVENTIL HFA;VENTOLIN HFA) 108 (90 BASE) MCG/ACT inhaler Inhale 2 puffs into the lungs every 4 (four) hours as needed for wheezing or shortness of breath. 8 g 6 unknown  . azaTHIOprine (IMURAN) 50 MG tablet Take 5 tablets (250 mg total) by mouth daily. 150 tablet 2 06/20/2014 at Unknown time  . betamethasone dipropionate (DIPROLENE) 0.05 % cream Apply topically 2 (two) times daily. 30 g 0 Past Month at Unknown time  . DULoxetine (CYMBALTA) 20 MG capsule Take 1 capsule (20 mg total) by mouth daily. 60 capsule 1 Past Month  at Unknown time  . HYDROcodone-acetaminophen (NORCO/VICODIN) 5-325 MG per tablet Take 1 tablet by mouth every 6 (six) hours as needed for moderate pain. 20 tablet 0 unknown  . hydroxychloroquine (PLAQUENIL) 200 MG tablet Take 1 tablet (200 mg total) by mouth 2 (two) times daily. 120 tablet 1 06/20/2014 at Unknown time  . hydrOXYzine (ATARAX/VISTARIL) 10 MG tablet Take 1 tablet (10 mg total) by mouth every 8 (eight) hours as needed for itching. 90 tablet 2 06/20/2014 at Unknown time  . meloxicam (MOBIC) 15 MG tablet Take 15 mg by mouth as needed for pain.    06/20/2014 at Unknown time  . ondansetron (ZOFRAN ODT) 4 MG disintegrating tablet 1 tab sublingual q6h PRN nausea/vomiting 10 tablet 0 unknown  . predniSONE (DELTASONE) 10 MG tablet 11/23-11/29: 5 pills per day.  12/30-12/6: 4 pills per day. 12/7-12/13: 3 pills per day. 12/14-12/20: 2 pills per day. Then 1 pill per day 105 tablet 1 06/20/2014 at Unknown time  . traMADol (ULTRAM) 50 MG tablet Take 50 mg by mouth every 8 (eight) hours as needed for moderate pain.   unknown  . levofloxacin (LEVAQUIN) 750 MG tablet Take 1 tablet (750 mg total) by mouth daily. (Patient not taking: Reported on 06/21/2014) 8 tablet 0 Past Week at Unknown time  . rivaroxaban (XARELTO) 20 MG TABS tablet Take 1 tablet (20 mg total) by mouth daily with supper. (  Patient not taking: Reported on 06/21/2014) 30 tablet 7     Assessment: 28 yo female admitted with chest pain, h/o PE, to continue Xarelto   Plan:  Xarelto 20 mg daily  Trystin Terhune, Gary Fleet 06/21/2014,7:42 AM

## 2014-06-21 NOTE — Discharge Summary (Signed)
Physician Discharge Summary  Donna Hall CHY:[id number] DOB: 1986-04-12 DOA: 06/20/2014  PCP: Lora Paula, MD  Admit date: 06/20/2014 Discharge date: 06/21/2014  Discharge Diagnoses:  Principal Problem:   Asthma exacerbation Active Problems:   INTERSTITIAL LUNG DISEASE   Psoriasis   Obesity, Class III, BMI 40-49.9 (morbid obesity)   Essential hypertension, benign   Pulmonary embolism   Anxiety and depression   Discharge Condition: stable  Filed Weights   06/20/14 2111 06/21/14 0550  Weight: 142.883 kg (315 lb) 159.2 kg (350 lb 15.6 oz)    History of present illness:  28 y.o. female with Past medical history of hypertension, morbid obesity, asthma, interstitial lung disease, pulmonary embolism in May 15, SLE, arthritis, immunosuppressive treatment. The patient presents with complaints of cough shortness of breath even or chest pain and generalized malaise ongoing since last 2 days. She mentions since last 4 days she has been having generalized weakness but since last 2 days she has generalized malaise. Since yesterday she started having complaints of chest pain and chest tightness. Along with that she had fever and chills and cough with expectoration without any blood. She complains of nausea but no vomiting. She complains of chronic abdominal pain which feels like a burning sensation with cramps. She mentions about an episode of loose watery bowel movement downstairs in the ER. Denies any burning sensation. She has on and off leg swelling. She claims she is compliant with all her medications but initially refused taking Xarelto to ER. She mentions to me that she has not taken her Xarelto on 26th but she has taken until 25th.  Hospital Course:  Started on steroids, bronchodilators, antibiotics.  Breathing improved to baseline quickly. Patient had clear breath sounds, normal vital signs and able to ambulate without  difficulty  Procedures:  none  Consultations:  none  Discharge Exam: Filed Vitals:   06/21/14 1506  BP: 116/69  Pulse: 76  Temp: 97.7 F (36.5 C)  Resp: 20    General: comfortable Cardiovascular: RRR Respiratory: CTA  Discharge Instructions   Discharge Instructions    Diet general    Complete by:  As directed      Increase activity slowly    Complete by:  As directed           Current Discharge Medication List    CONTINUE these medications which have CHANGED   Details  albuterol (PROVENTIL HFA;VENTOLIN HFA) 108 (90 BASE) MCG/ACT inhaler Inhale 2 puffs into the lungs every 4 (four) hours as needed for wheezing or shortness of breath. Qty: 8 g, Refills: 0    predniSONE (DELTASONE) 10 MG tablet 4 tablets daily for 2 days then decrease by 1 tablet every 2 days until off Qty: 20 tablet, Refills: 0      CONTINUE these medications which have NOT CHANGED   Details  azaTHIOprine (IMURAN) 50 MG tablet Take 5 tablets (250 mg total) by mouth daily. Qty: 150 tablet, Refills: 2    betamethasone dipropionate (DIPROLENE) 0.05 % cream Apply topically 2 (two) times daily. Qty: 30 g, Refills: 0   Associated Diagnoses: Psoriasis    DULoxetine (CYMBALTA) 20 MG capsule Take 1 capsule (20 mg total) by mouth daily. Qty: 60 capsule, Refills: 1   Associated Diagnoses: Anxiety and depression    HYDROcodone-acetaminophen (NORCO/VICODIN) 5-325 MG per tablet Take 1 tablet by mouth every 6 (six) hours as needed for moderate pain. Qty: 20 tablet, Refills: 0    hydroxychloroquine (PLAQUENIL) 200 MG tablet Take 1 tablet (  200 mg total) by mouth 2 (two) times daily. Qty: 120 tablet, Refills: 1   Associated Diagnoses: Lupus    hydrOXYzine (ATARAX/VISTARIL) 10 MG tablet Take 1 tablet (10 mg total) by mouth every 8 (eight) hours as needed for itching. Qty: 90 tablet, Refills: 2   Associated Diagnoses: Psoriasis    meloxicam (MOBIC) 15 MG tablet Take 15 mg by mouth as needed for pain.      ondansetron (ZOFRAN ODT) 4 MG disintegrating tablet 1 tab sublingual q6h PRN nausea/vomiting Qty: 10 tablet, Refills: 0    rivaroxaban (XARELTO) 20 MG TABS tablet Take 1 tablet (20 mg total) by mouth daily with supper. Qty: 30 tablet, Refills: 7   Associated Diagnoses: Pulmonary embolism    traMADol (ULTRAM) 50 MG tablet Take 50 mg by mouth every 8 (eight) hours as needed for moderate pain.  Doxycycline 100 mg po bid x 5 days    STOP taking these medications     levofloxacin (LEVAQUIN) 750 MG tablet        No Known Allergies Follow-up Information    Follow up with Lora Paula, MD In 1 week.   Specialty:  Family Medicine   Contact information:   [address] [place] [address] [phone]        The results of significant diagnostics from this hospitalization (including imaging, microbiology, ancillary and laboratory) are listed below for reference.    Significant Diagnostic Studies: Dg Chest 2 View  06/20/2014   CLINICAL DATA:  Chest pain and short of breath. History of pulmonary embolism  EXAM: CHEST  2 VIEW  COMPARISON:  Radiograph 06/07/2014  FINDINGS: Normal cardiac silhouette. There is mild interstitial edema pattern. There are low lung volumes. Chronic elevation of right hemidiaphragm. No overt pulmonary edema. No pneumothorax.  IMPRESSION: Interstitial edema better similar to prior.  Chronic elevation right hemidiaphragm.   Electronically Signed   By: Genevive Bi M.D.   On: 06/20/2014 23:49   Dg Chest 2 View  06/07/2014   CLINICAL DATA:  Shortness of breath and diffuse chest pain for 2 days.  EXAM: CHEST  2 VIEW  COMPARISON:  05/15/2014  FINDINGS: Cardiomediastinal silhouette is unchanged with the heart appearing upper limits of normal in size. Lung volumes remain diminished with persistence, mild elevation of the right hemidiaphragm. There is mild pulmonary vascular congestion. Diffuse interstitial densities are similar to the prior study with  similar appearance of asymmetric, patchy left upper lobe the opacity. No definite new airspace consolidation, pleural effusion, or pneumothorax is identified. No overt alveolar pulmonary edema is definitely seen. No acute osseous abnormality is identified.  IMPRESSION: Persistent low lung volumes with similar appearance of diffuse interstitial and asymmetric left upper lobe opacities. No definite acute airspace consolidation.   Electronically Signed   By: Sebastian Ache   On: 06/07/2014 19:54   Ct Abdomen Pelvis W Contrast  06/07/2014   CLINICAL DATA:  Mid epigastric abdominal pain with nausea, vomiting, and diarrhea.  EXAM: CT ABDOMEN AND PELVIS WITH CONTRAST  TECHNIQUE: Multidetector CT imaging of the abdomen and pelvis was performed using the standard protocol following bolus administration of intravenous contrast.  CONTRAST:  OMNIPAQUE IOHEXOL 300 MG/ML  SOLN  COMPARISON:  01/05/2006  FINDINGS: Intermittent patient motion which degrades image quality and sensitivity at the affected levels.  BODY WALL: Unremarkable.  LOWER CHEST: Elevation of the right diaphragm which was also seen March 11, 2014.  ABDOMEN/PELVIS:  Liver: No focal abnormality.  Biliary: Multiple gallstones noted.  There is no visible wall thickening or pericholecystic inflammation to suggest acute cholecystitis.  Pancreas: Unremarkable.  Spleen: Unremarkable.  Adrenals: Unremarkable.  Kidneys and ureters: No hydronephrosis or stone.  Bladder: Unremarkable.  Reproductive: Negative.  Bowel: No obstruction. No inflammatory bowel wall thickening Normal appendix.  Retroperitoneum: No mass or adenopathy.  Peritoneum: No ascites or pneumoperitoneum.  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  OSSEOUS: No acute abnormalities.  IMPRESSION: 1. No acute findings to explain abdominal pain. 2. Cholelithiasis.   Electronically Signed   By: Tiburcio Pea M.D.   On: 06/07/2014 21:32    Microbiology: No  results found for this or any previous visit (from the past 240 hour(s)).   Labs: Basic Metabolic Panel:  Recent Labs Lab 06/20/14 2116 06/20/14 2359 06/21/14 0803  NA 139 141 141  K 4.0 4.4 4.2  CL 108 108 107  CO2 23 25 25   GLUCOSE 171* 115* 199*  BUN 19 18 16   CREATININE 0.68 0.66 0.67  CALCIUM 8.5 8.5 8.5   Liver Function Tests:  Recent Labs Lab 06/21/14 0803  AST 33  ALT 21  ALKPHOS 70  BILITOT 0.2*  PROT 6.4  ALBUMIN 3.5   No results for input(s): LIPASE, AMYLASE in the last 168 hours. No results for input(s): AMMONIA in the last 168 hours. CBC:  Recent Labs Lab 06/20/14 2116 06/20/14 2359 06/21/14 0803  WBC 10.1 10.7* 8.1  NEUTROABS  --   --  7.8*  HGB 15.6* 15.5* 14.3  HCT 47.1* 47.0* 44.9  MCV 91.6 92.0 91.1  PLT 217 235 209   Cardiac Enzymes:  Recent Labs Lab 06/21/14 0803  TROPONINI <0.03   BNP: BNP (last 3 results)  Recent Labs  02/19/14 1926 03/11/14 2252 05/15/14 1021  PROBNP 18.3 25.2 26.7   CBG: No results for input(s): GLUCAP in the last 168 hours.     Signed09/18/15  [facility] 06/21/2014, 3:19 PM

## 2014-06-21 NOTE — H&P (Signed)
[facility] History and Physical  Patient: Donna Hall  ZOX:[id number]  DOB: 1986/03/16  DOS: the patient was seen and examined on 06/21/2014 PCP: Lora Paula, MD  Chief Complaint:  Chest pain  HPI: Donna Hall is a 28 y.o. female with Past medical history of hypertension, morbid obesity, asthma, interstitial lung disease, pulmonary embolism in May 15, SLE, arthritis, immunosuppressive treatment. The patient presents with complaints of cough shortness of breath even or chest pain and generalized malaise ongoing since last 2 days. She mentions since last 4 days she has been having generalized weakness but since last 2 days she has generalized malaise. Since yesterday she started having complaints of chest pain and chest tightness. Along with that she had fever and chills and cough with expectoration without any blood. She complains of nausea but no vomiting. She complains of chronic abdominal pain which feels like a burning sensation with cramps. She mentions about an episode of loose watery bowel movement downstairs in the ER. Denies any burning sensation. She has on and off leg swelling. She claims she is compliant with all her medications but initially refused taking Xarelto to ER. She mentions to me that she has not taken her Xarelto on 26th but she has taken until 25th.  The patient is coming from home. And at her baseline independent for most of her ADL.  Review of Systems: as mentioned in the history of present illness.  A Comprehensive review of the other systems is negative.  Past Medical History  Diagnosis Date  . Psoriasis 2010    as a child  . Chronic pain disorder   . Obesity   . Chronic steroid use 2010  . Lupus 2000  . Lupus (systemic lupus erythematosus) 2010  . Rheumatoid arthritis(714.0) 2010  . Gestational diabetes 2006  . Hypertension 2010  . Pneumonia "several times"  . Interstitial lung disease     /notes 05/15/2014  . On home  oxygen therapy     "2L; 24/7" (05/15/2014)  . Pulmonary embolism     hx/notes 05/15/2014  . History of blood transfusion     "because white cells ate the red cells"  . Headache     "just when I have the pain from aching bones and psorasis and/or lupus symptoms" (05/15/2104)   Past Surgical History  Procedure Laterality Date  . Thigh / knee soft tissue biopsy Right 2011    thigh  . Cesarean section  2010   Social History:  reports that she has never smoked. She has never used smokeless tobacco. She reports that she does not drink alcohol or use illicit drugs.  No Known Allergies  Family History  Problem Relation Age of Onset  . Diabetes Mother   . Hypertension Mother   . Cancer Neg Hx   . Early death Neg Hx   . Heart disease Neg Hx     Prior to Admission medications   Medication Sig Start Date End Date Taking? Authorizing Provider  albuterol (PROVENTIL HFA;VENTOLIN HFA) 108 (90 BASE) MCG/ACT inhaler Inhale 2 puffs into the lungs every 4 (four) hours as needed for wheezing or shortness of breath. 05/14/14  Yes Josalyn C Funches, MD  azaTHIOprine (IMURAN) 50 MG tablet Take 5 tablets (250 mg total) by mouth daily. 04/28/14  Yes Josalyn C Funches, MD  betamethasone dipropionate (DIPROLENE) 0.05 % cream Apply topically 2 (two) times daily. 05/14/14  Yes Josalyn C Funches, MD  DULoxetine (CYMBALTA) 20 MG capsule Take 1 capsule (20  mg total) by mouth daily. 05/14/14  Yes Josalyn C Funches, MD  HYDROcodone-acetaminophen (NORCO/VICODIN) 5-325 MG per tablet Take 1 tablet by mouth every 6 (six) hours as needed for moderate pain. 06/07/14  Yes Elwin Mocha, MD  hydroxychloroquine (PLAQUENIL) 200 MG tablet Take 1 tablet (200 mg total) by mouth 2 (two) times daily. 05/14/14  Yes Josalyn C Funches, MD  hydrOXYzine (ATARAX/VISTARIL) 10 MG tablet Take 1 tablet (10 mg total) by mouth every 8 (eight) hours as needed for itching. 05/14/14  Yes Josalyn C Funches, MD  meloxicam (MOBIC) 15 MG tablet Take  15 mg by mouth as needed for pain.    Yes Historical Provider, MD  ondansetron (ZOFRAN ODT) 4 MG disintegrating tablet 1 tab sublingual q6h PRN nausea/vomiting 06/07/14  Yes Elwin Mocha, MD  predniSONE (DELTASONE) 10 MG tablet 11/23-11/29: 5 pills per day.  12/30-12/6: 4 pills per day. 12/7-12/13: 3 pills per day. 12/14-12/20: 2 pills per day. Then 1 pill per day 05/17/14  Yes Abram Sander, MD  traMADol (ULTRAM) 50 MG tablet Take 50 mg by mouth every 8 (eight) hours as needed for moderate pain.   Yes Historical Provider, MD  levofloxacin (LEVAQUIN) 750 MG tablet Take 1 tablet (750 mg total) by mouth daily. Patient not taking: Reported on 06/21/2014 05/17/14   Abram Sander, MD  rivaroxaban (XARELTO) 20 MG TABS tablet Take 1 tablet (20 mg total) by mouth daily with supper. Patient not taking: Reported on 06/21/2014 04/02/14   Lora Paula, MD    Physical Exam: Filed Vitals:   06/21/14 0455 06/21/14 0507 06/21/14 0550 06/21/14 0642  BP: 130/69 131/66  112/44  Pulse: 127 124  129  Temp:      TempSrc:      Resp: 28 23  20   Height:   5\' 7"  (1.702 m)   Weight:   159.2 kg (350 lb 15.6 oz)   SpO2: 94% 92% 95% 96%    General: Alert, Awake and Oriented to Time, Place and Person. Appear in mild distress Eyes: PERRL ENT: Oral Mucosa clear moist. Neck: no JVD Cardiovascular: S1 and S2 Present, no Murmur, Peripheral Pulses Present Respiratory: Bilateral Air entry equal and Decreased, bilateral rhonchi and Crackles, bilateral expiratory wheezes Abdomen: Bowel Sound present, Soft and non tender Skin: no Rash Extremities: no Pedal edema, no calf tenderness Neurologic: Grossly no focal neuro deficit.  Labs on Admission:  CBC:  Recent Labs Lab 06/20/14 2116 06/20/14 2359  WBC 10.1 10.7*  HGB 15.6* 15.5*  HCT 47.1* 47.0*  MCV 91.6 92.0  PLT 217 235    CMP     Component Value Date/Time   NA 141 06/20/2014 2359   K 4.4 06/20/2014 2359   CL 108 06/20/2014 2359   CO2 25 06/20/2014  2359   GLUCOSE 115* 06/20/2014 2359   BUN 18 06/20/2014 2359   CREATININE 0.66 06/20/2014 2359   CALCIUM 8.5 06/20/2014 2359   PROT 7.4 06/07/2014 1726   ALBUMIN 3.8 06/07/2014 1726   AST 26 06/07/2014 1726   ALT 22 06/07/2014 1726   ALKPHOS 88 06/07/2014 1726   BILITOT 0.5 06/07/2014 1726   GFRNONAA >90 06/20/2014 2359   GFRAA >90 06/20/2014 2359    No results for input(s): LIPASE, AMYLASE in the last 168 hours. No results for input(s): AMMONIA in the last 168 hours.  No results for input(s): CKTOTAL, CKMB, CKMBINDEX, TROPONINI in the last 168 hours. BNP (last 3 results)  Recent Labs  02/19/14 1926 03/11/14 2252  05/15/14 1021  PROBNP 18.3 25.2 26.7    Radiological Exams on Admission: Dg Chest 2 View  06/20/2014   CLINICAL DATA:  Chest pain and short of breath. History of pulmonary embolism  EXAM: CHEST  2 VIEW  COMPARISON:  Radiograph 06/07/2014  FINDINGS: Normal cardiac silhouette. There is mild interstitial edema pattern. There are low lung volumes. Chronic elevation of right hemidiaphragm. No overt pulmonary edema. No pneumothorax.  IMPRESSION: Interstitial edema better similar to prior.  Chronic elevation right hemidiaphragm.   Electronically Signed   By: Genevive Bi M.D.   On: 06/20/2014 23:49    EKG: Independently reviewed. sinus tachycardia.  Assessment/Plan Principal Problem:   Asthma exacerbation Active Problems:   INTERSTITIAL LUNG DISEASE   Psoriasis   Obesity, Class III, BMI 40-49.9 (morbid obesity)   Essential hypertension, benign   Pulmonary embolism   Anxiety and depression   1. Asthma exacerbation The patient presents with complain of chest pain cough shortness of breath she is found to have expiratory wheezing with crackles suggesting exacerbation of asthma as well as interstitial lung disease.  currently she'll be treated with levofloxacin due to her recent hospitalization and DuoNeb's, IV Solu-Medrol, will follow culture as well as  antigens.  2. chest pain, abdominal pain. Patient requesting pain medication and mentions Norco has not been effective to control her pain. I would place on OxyIR as well as Percocet. Serial troponin. Patient has missed one dose of Xarelto. At present I would continue Xarelto.  3. SLE In view of ongoing infection holding her immunosuppressive medications.  Advance goals of care discussion: Full code   DVT Prophylaxis: on chronic anticoagulation Nutrition: Cardiac diet   Disposition: Admitted to inpatient in telemetry unit.  Author: Lynden Oxford, MD [facility] Pager: [phone] 06/21/2014, 7:12 AM    If 7PM-7AM, please contact night-coverage [email or web address] Password [email or web address]

## 2014-06-21 NOTE — ED Notes (Signed)
RT attempted to get ABG and patient jerked and stated it was painful.  RT notified MD.

## 2014-06-22 ENCOUNTER — Telehealth: Payer: Self-pay | Admitting: *Deleted

## 2014-06-22 NOTE — Telephone Encounter (Signed)
Pt requested refill Rx Xarelto Notified has 7 refills.

## 2014-06-29 ENCOUNTER — Encounter: Payer: Self-pay | Admitting: Family Medicine

## 2014-06-29 ENCOUNTER — Ambulatory Visit: Payer: Self-pay | Attending: Family Medicine | Admitting: Family Medicine

## 2014-06-29 VITALS — BP 114/62 | HR 122 | Temp 98.9°F | Resp 20 | Ht 63.0 in | Wt 306.0 lb

## 2014-06-29 DIAGNOSIS — M329 Systemic lupus erythematosus, unspecified: Secondary | ICD-10-CM | POA: Insufficient documentation

## 2014-06-29 DIAGNOSIS — J45901 Unspecified asthma with (acute) exacerbation: Secondary | ICD-10-CM | POA: Insufficient documentation

## 2014-06-29 DIAGNOSIS — Z7901 Long term (current) use of anticoagulants: Secondary | ICD-10-CM | POA: Insufficient documentation

## 2014-06-29 DIAGNOSIS — Z86711 Personal history of pulmonary embolism: Secondary | ICD-10-CM | POA: Insufficient documentation

## 2014-06-29 DIAGNOSIS — L409 Psoriasis, unspecified: Secondary | ICD-10-CM | POA: Insufficient documentation

## 2014-06-29 DIAGNOSIS — R51 Headache: Secondary | ICD-10-CM | POA: Insufficient documentation

## 2014-06-29 DIAGNOSIS — R42 Dizziness and giddiness: Secondary | ICD-10-CM | POA: Insufficient documentation

## 2014-06-29 DIAGNOSIS — R062 Wheezing: Secondary | ICD-10-CM

## 2014-06-29 DIAGNOSIS — R000 Tachycardia, unspecified: Secondary | ICD-10-CM | POA: Insufficient documentation

## 2014-06-29 DIAGNOSIS — J849 Interstitial pulmonary disease, unspecified: Secondary | ICD-10-CM | POA: Insufficient documentation

## 2014-06-29 DIAGNOSIS — I471 Supraventricular tachycardia: Secondary | ICD-10-CM

## 2014-06-29 DIAGNOSIS — J8410 Pulmonary fibrosis, unspecified: Secondary | ICD-10-CM

## 2014-06-29 LAB — POCT URINALYSIS DIPSTICK
Bilirubin, UA: NEGATIVE
Glucose, UA: NEGATIVE
Ketones, UA: NEGATIVE
Leukocytes, UA: NEGATIVE
Nitrite, UA: NEGATIVE
SPEC GRAV UA: 1.025
Urobilinogen, UA: 0.2
pH, UA: 8

## 2014-06-29 LAB — GLUCOSE, POCT (MANUAL RESULT ENTRY): POC Glucose: 123 mg/dl — AB (ref 70–99)

## 2014-06-29 MED ORDER — AZATHIOPRINE 50 MG PO TABS: 250.0000 mg | ORAL_TABLET | Freq: Every day | ORAL | Status: DC

## 2014-06-29 MED ORDER — TRAMADOL HCL 50 MG PO TABS: 50.0000 mg | ORAL_TABLET | Freq: Three times a day (TID) | ORAL | Status: DC | PRN

## 2014-06-29 MED ORDER — TRIAMCINOLONE ACETONIDE 0.1 % EX CREA: 1.0000 "application " | TOPICAL_CREAM | Freq: Two times a day (BID) | CUTANEOUS | Status: DC

## 2014-06-29 MED ORDER — PREDNISONE 10 MG PO TABS: 10.0000 mg | ORAL_TABLET | Freq: Every day | ORAL | Status: DC

## 2014-06-29 MED ORDER — HYDROXYCHLOROQUINE SULFATE 200 MG PO TABS: 200.0000 mg | ORAL_TABLET | Freq: Two times a day (BID) | ORAL | Status: DC

## 2014-06-29 MED ORDER — IPRATROPIUM-ALBUTEROL 0.5-2.5 (3) MG/3ML IN SOLN
3.0000 mL | Freq: Once | RESPIRATORY_TRACT | Status: AC
  Administered 2014-06-29: 3 mL via RESPIRATORY_TRACT

## 2014-06-29 MED ORDER — METOPROLOL SUCCINATE ER 25 MG PO TB24: 25.0000 mg | ORAL_TABLET | Freq: Every day | ORAL | Status: DC

## 2014-06-29 NOTE — Assessment & Plan Note (Signed)
A: symptomatic sinus tachycardia in the absence of dehydration or anemia. Patient has known hx of PE but last CTA with inconclusive for recurrent PE given significant interstitial lung disease so patient is anticoagulated at least until 10/2014, likely lifelong with xarelto and compliant P: Beta blocker  Repeat ECHO to evaluate LV function and rule out valvular heart disease  Consider TSH at next visit

## 2014-06-29 NOTE — Patient Instructions (Addendum)
Mrs. Donna Hall,  Thank you for coming in today.   1. Refilled triamcinolone, big jar, will be ordered and ready for pick up tomorrow.   2. Recent asthma and lupus exacerbation: albuterol available for pick up.   3. Dizziness and headache: with high heart rate. Start metoprolol xl   Refilled imuran, plaquenil and prednisone.   F/u in 3 days with RN for BP and HR check, orthostatics since starting beta blocker  F/u in 2  weeks for lupus and psoriasis, sinus tachycardia with me   Dr. Armen Pickup

## 2014-06-29 NOTE — Assessment & Plan Note (Signed)
A: wheezing and SOB. Improved with albuterol. Patient on chronic daily steroid P: Refill albuterol Continue prednisone 10 mg daily

## 2014-06-29 NOTE — Assessment & Plan Note (Signed)
A: active lesions P: refilled triamcinolone cream Recommended tea tree oil

## 2014-06-29 NOTE — Progress Notes (Signed)
   Subjective:    Patient ID: Donna Hall, female    DOB: Feb 22, 1986, 29 y.o.   MRN: [id number] CC: HFU for asthma exacerbation and lupus exacerbation, symptomatic tachycardia  HPI 29 yo F presents for f/u visit:  1. Asthma/lupus exacerbation: taking prednisone 10 mg daily and azathioprine. Out of plaquenil. Does not have albuterol inhaler complaining of SOB at rest.   2. Dizziness: dizzy with headache. X 3 weeks. Feeling like she will fall. Some nausea. No falls. No emesis. Well hydrated. No CP. Taking xarelto daily for hx of PE and high risk of recurrent PE.   Soc Hx: non smoker  Review of Systems As per HPI     Objective:   Physical Exam BP 137/76 mmHg  Pulse 116  Temp(Src) 98.9 F (37.2 C) (Oral)  Resp 20  Ht 5\' 3"  (1.6 m)  Wt 306 lb (138.801 kg)  BMI 54.22 kg/m2  SpO2 93%  LMP 06/05/2014 General appearance: alert, cooperative and no distress Lungs: normal WOB. diminished breath sounds b/l with scattered wheezing  Heart: S1, S2 normal, tachycardia  Skin: erythematous scaly papules and macules on arms, posterior neck, posterior auricular   Patient given duo neb x one   Repeat resp exam:  BP 124/64 mmHg  Pulse 116  Temp(Src) 98.9 F (37.2 C) (Oral)  Resp 20  Ht 5\' 3"  (1.6 m)  Wt 306 lb (138.801 kg)  BMI 54.22 kg/m2  SpO2 95%  LMP 06/05/2014 General appearance: alert, cooperative and no distress Lungs: normal WOB, clear lungs, improved aeration  Heart: regular rate and rhythm, S1, S2 normal, no murmur, click, rub or gallop  Orthostatic vital signs obtained and reviewed. Patient is orthostatic.  Normal UA. Normal CBG.      Assessment & Plan:

## 2014-07-03 ENCOUNTER — Encounter: Payer: Self-pay | Admitting: Family Medicine

## 2014-07-03 ENCOUNTER — Ambulatory Visit: Payer: Medicaid Other | Attending: Family Medicine | Admitting: Family Medicine

## 2014-07-03 VITALS — BP 120/83 | HR 103 | Temp 98.9°F | Resp 16 | Ht 63.0 in | Wt 303.0 lb

## 2014-07-03 DIAGNOSIS — M329 Systemic lupus erythematosus, unspecified: Secondary | ICD-10-CM | POA: Insufficient documentation

## 2014-07-03 DIAGNOSIS — L409 Psoriasis, unspecified: Secondary | ICD-10-CM

## 2014-07-03 DIAGNOSIS — J849 Interstitial pulmonary disease, unspecified: Secondary | ICD-10-CM | POA: Insufficient documentation

## 2014-07-03 DIAGNOSIS — Z7952 Long term (current) use of systemic steroids: Secondary | ICD-10-CM | POA: Insufficient documentation

## 2014-07-03 DIAGNOSIS — J45909 Unspecified asthma, uncomplicated: Secondary | ICD-10-CM | POA: Insufficient documentation

## 2014-07-03 DIAGNOSIS — Z7901 Long term (current) use of anticoagulants: Secondary | ICD-10-CM | POA: Insufficient documentation

## 2014-07-03 DIAGNOSIS — R519 Headache, unspecified: Secondary | ICD-10-CM | POA: Insufficient documentation

## 2014-07-03 DIAGNOSIS — G44219 Episodic tension-type headache, not intractable: Secondary | ICD-10-CM

## 2014-07-03 DIAGNOSIS — Z6841 Body Mass Index (BMI) 40.0 and over, adult: Secondary | ICD-10-CM | POA: Insufficient documentation

## 2014-07-03 DIAGNOSIS — I471 Supraventricular tachycardia: Secondary | ICD-10-CM

## 2014-07-03 DIAGNOSIS — J8410 Pulmonary fibrosis, unspecified: Secondary | ICD-10-CM

## 2014-07-03 DIAGNOSIS — R000 Tachycardia, unspecified: Secondary | ICD-10-CM | POA: Insufficient documentation

## 2014-07-03 DIAGNOSIS — J189 Pneumonia, unspecified organism: Secondary | ICD-10-CM

## 2014-07-03 DIAGNOSIS — R51 Headache: Secondary | ICD-10-CM | POA: Insufficient documentation

## 2014-07-03 MED ORDER — CLOBETASOL PROPIONATE 0.05 % EX SOLN: 1.0000 "application " | Freq: Two times a day (BID) | CUTANEOUS | Status: DC

## 2014-07-03 MED ORDER — METHYLPREDNISOLONE ACETATE 80 MG/ML IJ SUSP
80.0000 mg | Freq: Once | INTRAMUSCULAR | Status: AC
  Administered 2014-07-03: 80 mg via INTRAMUSCULAR

## 2014-07-03 MED ORDER — METHYLPREDNISOLONE ACETATE 40 MG/ML IJ SUSP
40.0000 mg | Freq: Once | INTRAMUSCULAR | Status: AC
  Administered 2014-07-03: 40 mg via INTRAMUSCULAR

## 2014-07-03 MED ORDER — IBUPROFEN 200 MG PO TABS
600.0000 mg | ORAL_TABLET | Freq: Once | ORAL | Status: AC
  Administered 2014-07-03: 600 mg via ORAL

## 2014-07-03 NOTE — Progress Notes (Signed)
Pt is here c/o SOB and she is feeling itchy all over her body. Pt reports having chronic pain in her joints and knees. Pt states that she has a headache today.

## 2014-07-03 NOTE — Assessment & Plan Note (Signed)
A: improved with improvement in resp symptoms. No BB needed. P: D./c metoprolol order Monitor q visit

## 2014-07-03 NOTE — Progress Notes (Signed)
   Subjective:    Patient ID: Donna Hall, female    DOB: [birth year], 29 y.o.   MRN: [id number] CC: f/u tachycardia and SOB, skin rash  HPI 29 yo F with lupus, psoriasis, interstitial lung disease, chronic daily steroid: History obtained via Spanish interpreter   1. SOB: persistent. A bit improved. Associated with dry cough. Taking doxycyline and prednisone for flare of interstitial lung disease/asthma. Taking daily xarelto. No fever.   2. Tachycardia: improved. Patient did not start toprol. She is not feeling dizzy. She does still have intermittent frontal headache.   2. Skin rash: consistent with psoriasis rash. She had not yet started topical kenolog. She would like medication for scalp lesions as well. Lesions are extremely itchy.   Soc Hx: non smoker  Review of Systems As per HPI    Objective:   Physical Exam BP 120/83 mmHg  Pulse 103  Temp(Src) 98.9 F (37.2 C) (Oral)  Resp 16  Ht 5\' 3"  (1.6 m)  Wt 303 lb (137.44 kg)  BMI 53.69 kg/m2  SpO2 94%  LMP 06/05/2014 General appearance: alert, cooperative, no distress and morbidly obese Lungs: slight increase WOB, scant wheezing, decreased lung expansion  Heart: regular rate and rhythm, S1, S2 normal, no murmur, click, rub or gallop Skin: hyperpigmented papular rash on abdomen. Hyperpigmented scaly macular rash on scalp and neck.     Assessment & Plan:

## 2014-07-03 NOTE — Patient Instructions (Signed)
Mrs. Donna Hall,  Thank you for coming back to see me today.  Heart rate improved. Continue fluids. Depo medrol 120 mg IM x one today for wheezing and SOB. Finish doxycycline-antibiotic.   Scalp: steroid ointment   Checking labs  Return in 9 days   Dr. Armen Pickup

## 2014-07-03 NOTE — Assessment & Plan Note (Signed)
A: severe P: Patient to start triamcinolone for body Clobetasol for scalp

## 2014-07-03 NOTE — Assessment & Plan Note (Signed)
A: improving P: Finish doxycyline Finish prednisone Albuterol prn  IM depo medrol 120 mg x one today F/u in 9 days CBC, BMP today

## 2014-07-04 LAB — BASIC METABOLIC PANEL
BUN: 17 mg/dL (ref 6–23)
CO2: 30 mEq/L (ref 19–32)
Calcium: 9 mg/dL (ref 8.4–10.5)
Chloride: 105 mEq/L (ref 96–112)
Creat: 0.57 mg/dL (ref 0.50–1.10)
Glucose, Bld: 82 mg/dL (ref 70–99)
Potassium: 4.4 mEq/L (ref 3.5–5.3)
Sodium: 141 mEq/L (ref 135–145)

## 2014-07-04 LAB — CBC WITH DIFFERENTIAL/PLATELET
Basophils Absolute: 0 10*3/uL (ref 0.0–0.1)
Basophils Relative: 0 % (ref 0–1)
EOS ABS: 0.1 10*3/uL (ref 0.0–0.7)
EOS PCT: 1 % (ref 0–5)
HCT: 47.3 % — ABNORMAL HIGH (ref 36.0–46.0)
Hemoglobin: 15.9 g/dL — ABNORMAL HIGH (ref 12.0–15.0)
LYMPHS ABS: 2.7 10*3/uL (ref 0.7–4.0)
Lymphocytes Relative: 20 % (ref 12–46)
MCH: 29.5 pg (ref 26.0–34.0)
MCHC: 33.6 g/dL (ref 30.0–36.0)
MCV: 87.8 fL (ref 78.0–100.0)
MONOS PCT: 5 % (ref 3–12)
MPV: 9.4 fL (ref 8.6–12.4)
Monocytes Absolute: 0.7 10*3/uL (ref 0.1–1.0)
NEUTROS ABS: 10.1 10*3/uL — AB (ref 1.7–7.7)
NEUTROS PCT: 74 % (ref 43–77)
PLATELETS: 252 10*3/uL (ref 150–400)
RBC: 5.39 MIL/uL — ABNORMAL HIGH (ref 3.87–5.11)
RDW: 14.2 % (ref 11.5–15.5)
WBC: 13.6 10*3/uL — ABNORMAL HIGH (ref 4.0–10.5)

## 2014-07-07 ENCOUNTER — Telehealth: Payer: Self-pay | Admitting: *Deleted

## 2014-07-07 NOTE — Telephone Encounter (Signed)
Pt aware of lab results 

## 2014-07-07 NOTE — Telephone Encounter (Signed)
-----   Message from Lora Paula, MD sent at 07/06/2014  1:30 PM EST ----- Normal BMP Elevated WBC on CBC in the setting of increase steroid dose Continue current treatment plan.

## 2014-07-08 ENCOUNTER — Telehealth: Payer: Self-pay | Admitting: *Deleted

## 2014-07-08 NOTE — Telephone Encounter (Signed)
Pt in our clinic for ECHO appointment Appointment information was given to pt  (information was given in spanish)

## 2014-07-08 NOTE — Telephone Encounter (Signed)
Ok.  Echo was ordered on 06/29/14, it is under the procedures tab.

## 2014-07-10 ENCOUNTER — Ambulatory Visit (HOSPITAL_COMMUNITY)
Admission: RE | Admit: 2014-07-10 | Discharge: 2014-07-10 | Disposition: A | Discharge status: Home / Self Care | Payer: Self-pay | Source: Ambulatory Visit | Attending: Family Medicine | Admitting: Family Medicine

## 2014-07-10 DIAGNOSIS — R000 Tachycardia, unspecified: Secondary | ICD-10-CM

## 2014-07-10 DIAGNOSIS — R0609 Other forms of dyspnea: Secondary | ICD-10-CM | POA: Insufficient documentation

## 2014-07-10 NOTE — Progress Notes (Signed)
  Echocardiogram 2D Echocardiogram has been performed.  Delcie Roch 07/10/2014, 2:01 PM

## 2014-07-13 ENCOUNTER — Telehealth: Payer: Self-pay | Admitting: *Deleted

## 2014-07-13 ENCOUNTER — Ambulatory Visit: Payer: MEDICAID | Attending: Family Medicine | Admitting: Family Medicine

## 2014-07-13 ENCOUNTER — Encounter: Payer: Self-pay | Admitting: Family Medicine

## 2014-07-13 VITALS — BP 116/70 | HR 86 | Temp 98.3°F | Resp 16 | Ht 62.0 in | Wt 305.0 lb

## 2014-07-13 DIAGNOSIS — R000 Tachycardia, unspecified: Secondary | ICD-10-CM | POA: Insufficient documentation

## 2014-07-13 DIAGNOSIS — R42 Dizziness and giddiness: Secondary | ICD-10-CM | POA: Insufficient documentation

## 2014-07-13 DIAGNOSIS — Z7901 Long term (current) use of anticoagulants: Secondary | ICD-10-CM | POA: Insufficient documentation

## 2014-07-13 DIAGNOSIS — Z6841 Body Mass Index (BMI) 40.0 and over, adult: Secondary | ICD-10-CM | POA: Insufficient documentation

## 2014-07-13 DIAGNOSIS — J8410 Pulmonary fibrosis, unspecified: Secondary | ICD-10-CM

## 2014-07-13 DIAGNOSIS — J849 Interstitial pulmonary disease, unspecified: Secondary | ICD-10-CM | POA: Insufficient documentation

## 2014-07-13 DIAGNOSIS — I319 Disease of pericardium, unspecified: Secondary | ICD-10-CM

## 2014-07-13 DIAGNOSIS — I471 Supraventricular tachycardia: Secondary | ICD-10-CM

## 2014-07-13 DIAGNOSIS — M329 Systemic lupus erythematosus, unspecified: Secondary | ICD-10-CM

## 2014-07-13 DIAGNOSIS — L539 Erythematous condition, unspecified: Secondary | ICD-10-CM | POA: Insufficient documentation

## 2014-07-13 DIAGNOSIS — J189 Pneumonia, unspecified organism: Secondary | ICD-10-CM

## 2014-07-13 MED ORDER — PREDNISONE 10 MG PO TABS: 10.0000 mg | ORAL_TABLET | Freq: Every day | ORAL | Status: DC

## 2014-07-13 NOTE — Patient Instructions (Signed)
Donna Hall,   Thank you for coming in today.  1. Shortness of breath: Sleep study Referral to pulmonology Continue current medication regimen. Ordered oxygen to have when moving around   2. Dizziness: I suspect this is due to your drop in oxygen levels with activity, so I ordered home O2.   F/u in 3 weeks for SOB  Dr. Armen Pickup

## 2014-07-13 NOTE — Assessment & Plan Note (Signed)
Treated Resolved

## 2014-07-13 NOTE — Telephone Encounter (Signed)
-----   Message from Lora Paula, MD sent at 07/13/2014  2:15 PM EST ----- Normal heart pumping function without change from last ECHO in 2009

## 2014-07-13 NOTE — Telephone Encounter (Signed)
Left voice message with normal results If any question return call (information was left in Spanish)

## 2014-07-13 NOTE — Progress Notes (Signed)
History obtained using interpreter # [phone] Patient is here to follow up She said someone called her and told her to come see the MD She is unaware of why she is here

## 2014-07-13 NOTE — Assessment & Plan Note (Signed)
Dizziness: I suspect this is due to your drop in oxygen levels with activity, so I ordered home O2.

## 2014-07-13 NOTE — Assessment & Plan Note (Signed)
Shortness of breath: Sleep study Referral to pulmonology Continue current medication regimen. Ordered oxygen to have when moving around

## 2014-07-13 NOTE — Progress Notes (Signed)
   Subjective:    Patient ID: Donna Hall, female    DOB: Jan 02, 1986, 29 y.o.   MRN: [id number] CC: f/u SOB  HPI 29 yo F with hx of RA, SLE, interstitial lung disease, chronic steroid, anticoagulated on xarelto, psoriasis  presents for f/u:    1. SOB: persistent SOB at night on with activity. She also has cough. Her cough is non-productive. She has no fever. Patient has completed doxycyline for recent pneumonia dx in ED. She has tapered her prednisone back down to baseline dose of 10 mg daily. She continues daily xarelto. She use the albuterol inhaler 2-3 times per day. In addition to SOB she is dizzy with activity (spinning and feeling of dropping). She went for ECHO which revealed normal EF. Due to her habitus diastolic function was not fully evaluated.   Soc Hx: non smoker  Review of Systems As per HPI     Objective:   Physical Exam BP 116/70 mmHg  Pulse 86  Temp(Src) 98.3 F (36.8 C)  Resp 16  Ht 5\' 2"  (1.575 m)  Wt 305 lb (138.347 kg)  BMI 55.77 kg/m2  SpO2 92%  LMP 07/12/2014  SpO2 Readings from Last 3 Encounters:  07/13/14 80% on RA when ambulating   07/03/14 94%  06/29/14 95%   Wt Readings from Last 3 Encounters:  07/13/14 305 lb (138.347 kg)  07/03/14 303 lb (137.44 kg)  06/29/14 306 lb (138.801 kg)  General appearance: alert, cooperative and morbidly obese Lungs: BS restricted b/l. No wheezing, rhonchi or crackles.  Heart: regular rate and rhythm, S1, S2 normal, no murmur, click, rub or gallop Skin: multiple erythematous papules on chest and arms with erythematous plagues on neck and hairline.        Assessment & Plan:

## 2014-07-13 NOTE — Assessment & Plan Note (Signed)
Resolved

## 2014-07-13 NOTE — Assessment & Plan Note (Signed)
Not noted on most recent ECHO

## 2014-07-22 ENCOUNTER — Ambulatory Visit (INDEPENDENT_AMBULATORY_CARE_PROVIDER_SITE_OTHER): Payer: Self-pay | Admitting: Internal Medicine

## 2014-07-22 ENCOUNTER — Encounter: Payer: Self-pay | Admitting: Internal Medicine

## 2014-07-22 VITALS — BP 100/60 | HR 95 | Ht 62.5 in | Wt 309.0 lb

## 2014-07-22 DIAGNOSIS — J45991 Cough variant asthma: Secondary | ICD-10-CM

## 2014-07-22 DIAGNOSIS — J8410 Pulmonary fibrosis, unspecified: Secondary | ICD-10-CM

## 2014-07-22 MED ORDER — MOMETASONE FURO-FORMOTEROL FUM 100-5 MCG/ACT IN AERO: INHALATION_SPRAY | RESPIRATORY_TRACT | Status: DC

## 2014-07-22 NOTE — Patient Instructions (Signed)
Start dulera 100 Take 2 puffs first thing in am and then another 2 puffs about 12 hours later.   Try over the counter prilosec 20mg   Take 30-60 min before first meal of the day and Pepcid 20 mg one bedtime until  Return  GERD (REFLUX)  is an extremely common cause of respiratory symptoms just like yours , many times with no obvious heartburn at all.    It can be treated with medication, but also with lifestyle changes including avoidance of late meals, excessive alcohol, smoking cessation, and avoid fatty foods, chocolate, peppermint, colas, red wine, and acidic juices such as orange juice.  NO MINT OR MENTHOL PRODUCTS SO NO COUGH DROPS  USE SUGARLESS CANDY INSTEAD (Jolley ranchers or Stover's or Life Savers) or even ice chips will also do - the key is to swallow to prevent all throat clearing. NO OIL BASED VITAMINS - use powdered substitutes.    Please schedule a follow up office visit in 2 weeks, sooner if needed

## 2014-07-22 NOTE — Progress Notes (Signed)
Subjective:     Patient ID: Donna Hall, female   DOB: July 30, 1985,    MRN: [id number]  HPI   29 yo latino female never smoker followed previously at [facility] with dx of lupus on steroids x  Around 2011 on a floor of 5 mg daily and a ceiling of 20 mg referred by Dr Armen Pickup for ILD 07/22/2014 p admit  Admit date: 06/20/2014 Discharge date: 06/21/2014  Discharge Diagnoses:  Principal Problem:  Asthma exacerbation   INTERSTITIAL LUNG DISEASE  Psoriasis  Obesity, Class III, BMI 40-49.9 (morbid obesity)  Essential hypertension, benign  Pulmonary embolism  Anxiety and depression   07/22/2014 1st [facility] office visit/ Karren Newland   Chief Complaint  Patient presents with  . Advice Only    Referred for Pulm Fibrosis eval: Pt has cough with yellow tinge with small blood. Pt  has sore throat. Pt denies chest tightness but does have wheezing. Pt took Nyquil last night for sore throat.  presently on 10 mg per day with active arthritis hands/ shoulders/ knees worse than usual  Cough x one month minimally discolored assoc with overt HB and nasal congestion  Symptoms better for a few hours p saba   No obvious day to day or daytime variabilty or assoc chronic cough or cp or chest tightness, subjective wheeze   symptoms. No unusual exp hx or h/o childhood pna/ asthma or knowledge of premature birth.  Sleeping ok without nocturnal  or early am exacerbation  of respiratory  c/o's or need for noct saba. Also denies any obvious fluctuation of symptoms with weather or environmental changes or other aggravating or alleviating factors except as outlined above   Current Medications, Allergies, Complete Past Medical History, Past Surgical History, Family History, and Social History were reviewed in [employer] record.  ROS  The following are not active complaints unless bolded sore throat, dysphagia, dental problems, itching, sneezing,  nasal congestion or excess/  purulent secretions, ear ache,   fever, chills, sweats, unintended wt loss, pleuritic or exertional cp, hemoptysis,  orthopnea pnd or leg swelling, presyncope, palpitations, heartburn, abdominal pain, anorexia, nausea, vomiting, diarrhea  or change in bowel or urinary habits, change in stools or urine, dysuria,hematuria,  rash, arthralgias, visual complaints, headache, numbness weakness or ataxia or problems with walking or coordination,  change in mood/affect or memory.            Review of Systems     Objective:   Physical Exam Extremely cushingnoid latino female nad   Wt Readings from Last 3 Encounters:  07/22/14 309 lb (140.161 kg)  07/13/14 305 lb (138.347 kg)  07/03/14 303 lb (137.44 kg)    Vital signs reviewed    HEENT: nl dentition, turbinates, and orophanx. Nl external ear canals without cough reflex   NECK :  without JVD/Nodes/TM/ nl carotid upstrokes bilaterally   LUNGS: no acc muscle use, a few insp/ exp rhonchi bilaterally  CV:  RRR  no s3 or murmur or increase in P2, no edema   ABD:  soft and nontender with nl excursion in the supine position. No bruits or organomegaly, bowel sounds nl  MS:  warm without deformities, calf tenderness, cyanosis or clubbing  SKIN: warm and dry without lesions    NEURO:  alert, approp, no deficits     CXR:  07/21/13  I personally reviewed images and agree with radiology impression as follows:   Interstitial edema  similar to prior. Chronic elevation right hemidiaphragm.  Assessment:

## 2014-07-23 DIAGNOSIS — J45991 Cough variant asthma: Secondary | ICD-10-CM | POA: Insufficient documentation

## 2014-07-23 NOTE — Assessment & Plan Note (Signed)
Unusual problem in pt on steroids anyway for lupus and difficult to control historically   DDX of  difficult airways management all start with A and  include Adherence, Ace Inhibitors, Acid Reflux, Active Sinus Disease, Alpha 1 Antitripsin deficiency, Anxiety masquerading as Airways dz,  ABPA,  allergy(esp in young), Aspiration (esp in elderly), Adverse effects of DPI,  Active smokers, plus two Bs  = Bronchiectasis and Beta blocker use..and one C= CHF  Adherence is always the initial "prime suspect" and is a multilayered concern that requires a "trust but verify" approach in every patient - starting with knowing how to use medications, especially inhalers, correctly, keeping up with refills and understanding the fundamental difference between maintenance and prns vs those medications only taken for a very short course and then stopped and not refilled.  The proper method of use, as well as anticipated side effects, of a metered-dose inhaler are discussed and demonstrated to the patient. Improved effectiveness after extensive coaching during this visit to a level of approximately  75% > try dulera 100 2bid which should not provoke more coughing  ? Acid (or non-acid) GERD > always difficult to exclude as up to 75% of pts in some series report no assoc GI/ Heartburn symptoms> rec max (24h)  acid suppression and diet restrictions/ reviewed and instructions given in writing.   ? Allergy > very unlikely on such extensive list of antiflammatories     Each maintenance medication was reviewed in detail including most importantly the difference between maintenance and as needed and under what circumstances the prns are to be used.  Please see instructions for details which were reviewed in writing and the patient given a copy.

## 2014-07-23 NOTE — Assessment & Plan Note (Addendum)
[place] eval at [facility] 04/01/14 dx prob ild related to connective tissue dz aggravated by obesity (see care everywhere)  DDx for pulmonary fibrosis  includes idiopathic pulmonary fibrosis, pulmonary fibrosis associated with rheumatologic diseases (which have a relatively benign course in most cases) , adverse effect from  drugs such as chemotherapy or amiodarone exposure, nonspecific interstitial pneumonia which is typically steroid responsive, and chronic hypersensitivity pneumonitis.   In active  smokers Langerhan's Cell  Histiocyctosis (eosinophilic granuomatosis),  DIP,  and Respiratory Bronchiolitis ILD also need to be considered,    Most likley this is related to lupus. A good rule of thumb is that if the systemic dz is not well controlled (as reflected here by active arthritis esp in hands) then the pulmonary dz can parallel it > rec rheum f/u asap but defer all referrals to primary care   In meantime agree with Dr Susa Simmonds re obesity issues  - the elevated HD has not been independently evaluated but is chronic ? paraesis ? Vanishing lung syndrome from lupus

## 2014-07-28 ENCOUNTER — Emergency Department (HOSPITAL_COMMUNITY)
Admission: EM | Admit: 2014-07-28 | Discharge: 2014-07-29 | Disposition: A | Discharge status: Home / Self Care | Payer: Self-pay | Attending: Emergency Medicine | Admitting: Emergency Medicine

## 2014-07-28 ENCOUNTER — Emergency Department (HOSPITAL_COMMUNITY): Payer: Self-pay

## 2014-07-28 ENCOUNTER — Encounter (HOSPITAL_COMMUNITY): Payer: Self-pay | Admitting: Emergency Medicine

## 2014-07-28 DIAGNOSIS — Z86711 Personal history of pulmonary embolism: Secondary | ICD-10-CM | POA: Insufficient documentation

## 2014-07-28 DIAGNOSIS — Z7901 Long term (current) use of anticoagulants: Secondary | ICD-10-CM | POA: Insufficient documentation

## 2014-07-28 DIAGNOSIS — R519 Headache, unspecified: Secondary | ICD-10-CM

## 2014-07-28 DIAGNOSIS — Z9981 Dependence on supplemental oxygen: Secondary | ICD-10-CM | POA: Insufficient documentation

## 2014-07-28 DIAGNOSIS — G8929 Other chronic pain: Secondary | ICD-10-CM | POA: Insufficient documentation

## 2014-07-28 DIAGNOSIS — J45901 Unspecified asthma with (acute) exacerbation: Secondary | ICD-10-CM | POA: Insufficient documentation

## 2014-07-28 DIAGNOSIS — Z7952 Long term (current) use of systemic steroids: Secondary | ICD-10-CM | POA: Insufficient documentation

## 2014-07-28 DIAGNOSIS — R0602 Shortness of breath: Secondary | ICD-10-CM

## 2014-07-28 DIAGNOSIS — R0789 Other chest pain: Secondary | ICD-10-CM | POA: Insufficient documentation

## 2014-07-28 DIAGNOSIS — Z79899 Other long term (current) drug therapy: Secondary | ICD-10-CM | POA: Insufficient documentation

## 2014-07-28 DIAGNOSIS — I10 Essential (primary) hypertension: Secondary | ICD-10-CM | POA: Insufficient documentation

## 2014-07-28 DIAGNOSIS — Z8632 Personal history of gestational diabetes: Secondary | ICD-10-CM | POA: Insufficient documentation

## 2014-07-28 DIAGNOSIS — Z791 Long term (current) use of non-steroidal anti-inflammatories (NSAID): Secondary | ICD-10-CM | POA: Insufficient documentation

## 2014-07-28 DIAGNOSIS — Z3202 Encounter for pregnancy test, result negative: Secondary | ICD-10-CM | POA: Insufficient documentation

## 2014-07-28 DIAGNOSIS — E669 Obesity, unspecified: Secondary | ICD-10-CM | POA: Insufficient documentation

## 2014-07-28 DIAGNOSIS — R51 Headache: Secondary | ICD-10-CM | POA: Insufficient documentation

## 2014-07-28 DIAGNOSIS — M069 Rheumatoid arthritis, unspecified: Secondary | ICD-10-CM | POA: Insufficient documentation

## 2014-07-28 DIAGNOSIS — Z872 Personal history of diseases of the skin and subcutaneous tissue: Secondary | ICD-10-CM | POA: Insufficient documentation

## 2014-07-28 LAB — BASIC METABOLIC PANEL
Anion gap: 8 (ref 5–15)
BUN: 13 mg/dL (ref 6–23)
CALCIUM: 8.8 mg/dL (ref 8.4–10.5)
CHLORIDE: 105 mmol/L (ref 96–112)
CO2: 26 mmol/L (ref 19–32)
Creatinine, Ser: 0.57 mg/dL (ref 0.50–1.10)
GFR calc Af Amer: >90 mL/min (ref >90)
GLUCOSE: 139 mg/dL — AB (ref 70–99)
POTASSIUM: 3.8 mmol/L (ref 3.5–5.1)
Sodium: 139 mmol/L (ref 135–145)

## 2014-07-28 LAB — CBC
HEMATOCRIT: 48.7 % — AB (ref 36.0–46.0)
Hemoglobin: 15.5 g/dL — ABNORMAL HIGH (ref 12.0–15.0)
MCH: 29.7 pg (ref 26.0–34.0)
MCHC: 31.8 g/dL (ref 30.0–36.0)
MCV: 93.3 fL (ref 78.0–100.0)
PLATELETS: 260 10*3/uL (ref 150–400)
RBC: 5.22 MIL/uL — AB (ref 3.87–5.11)
RDW: 13.2 % (ref 11.5–15.5)
WBC: 8.6 10*3/uL (ref 4.0–10.5)

## 2014-07-28 LAB — POC URINE PREG, ED: Preg Test, Ur: NEGATIVE

## 2014-07-28 LAB — I-STAT TROPONIN, ED: TROPONIN I, POC: 0 ng/mL (ref 0.00–0.08)

## 2014-07-28 LAB — D-DIMER, QUANTITATIVE (NOT AT ARMC): D DIMER QUANT: 0.72 ug{FEU}/mL — AB (ref 0.00–0.48)

## 2014-07-28 MED ORDER — METOCLOPRAMIDE HCL 5 MG/ML IJ SOLN
10.0000 mg | Freq: Once | INTRAMUSCULAR | Status: AC
  Administered 2014-07-28: 10 mg via INTRAVENOUS
  Filled 2014-07-28: qty 2

## 2014-07-28 MED ORDER — SODIUM CHLORIDE 0.9 % IV BOLUS (SEPSIS)
1000.0000 mL | INTRAVENOUS | Status: AC
  Administered 2014-07-28: 1000 mL via INTRAVENOUS

## 2014-07-28 MED ORDER — HYDROMORPHONE HCL 1 MG/ML IJ SOLN
0.5000 mg | Freq: Once | INTRAMUSCULAR | Status: AC
  Administered 2014-07-28: 0.5 mg via INTRAVENOUS
  Filled 2014-07-28: qty 1

## 2014-07-28 MED ORDER — DIPHENHYDRAMINE HCL 50 MG/ML IJ SOLN
25.0000 mg | Freq: Once | INTRAMUSCULAR | Status: AC
  Administered 2014-07-28: 25 mg via INTRAVENOUS
  Filled 2014-07-28: qty 1

## 2014-07-28 NOTE — ED Notes (Signed)
Patient reports having central CP which she describes as pins and needles x1 week with radiation to her back. Denies N/V or SOB. Patient also reports a headache x1 week with photophobia. Also reports a productive cough x1 week. Patient is tearful in triage.

## 2014-07-28 NOTE — ED Provider Notes (Signed)
CSN: [id number]     Arrival date & time 07/28/14  2050 History   First MD Initiated Contact with Patient 07/28/14 2211     Chief Complaint  Patient presents with  . Chest Pain  . Headache  . Cough     (Consider location/radiation/quality/duration/timing/severity/associated sxs/prior Treatment) Patient is a 29 y.o. female presenting with chest pain and headaches. The history is provided by the patient.  Chest Pain Pain location:  Substernal area Pain quality: sharp   Pain radiates to:  Does not radiate Pain radiates to the back: no   Pain severity:  Moderate Onset quality:  Gradual Duration:  2 weeks Timing:  Intermittent Progression:  Unchanged Chronicity:  Recurrent Context: breathing and at rest   Relieved by:  Nothing Worsened by:  Nothing tried Ineffective treatments: tramadol. Associated symptoms: cough, headache and shortness of breath   Associated symptoms: no abdominal pain, no back pain, no dizziness, no fatigue, no fever, no nausea and not vomiting   Headache Pain location:  Frontal Quality:  Dull Radiates to:  Does not radiate Severity currently:  10/10 Severity at highest:  10/10 Onset quality:  Gradual Duration:  3 days Timing:  Constant Progression:  Unchanged Chronicity:  New Similar to prior headaches: yes   Relieved by:  Nothing Worsened by:  Nothing tried Ineffective treatments: tramadol. Associated symptoms: cough   Associated symptoms: no abdominal pain, no back pain, no congestion, no diarrhea, no dizziness, no pain, no fatigue, no fever, no nausea, no neck pain and no vomiting     Past Medical History  Diagnosis Date  . Psoriasis 2010    as a child  . Chronic pain disorder   . Obesity   . Chronic steroid use 2010  . Lupus 2000  . Lupus (systemic lupus erythematosus) 2010  . Rheumatoid arthritis(714.0) 2010  . Gestational diabetes 2006  . Hypertension 2010  . Pneumonia "several times"  . Interstitial lung disease     /notes 05/15/2014   . On home oxygen therapy     "2L; 24/7" (05/15/2014)  . Pulmonary embolism     hx/notes 05/15/2014  . History of blood transfusion     "because white cells ate the red cells"  . Headache     "just when I have the pain from aching bones and psorasis and/or lupus symptoms" (05/15/2104)  . 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 Thigh / knee soft tissue biopsy Right 2011    thigh  . Cesarean section  2010   Family History  Problem Relation Age of Onset  . Diabetes Mother   . Hypertension Mother   . Cancer Neg Hx   . Early death Neg Hx   . Heart disease Neg Hx    History  Substance Use Topics  . Smoking status: Never Smoker   . Smokeless tobacco: Never Used  . Alcohol Use: No   OB History    No data available     Review of Systems  Constitutional: Negative for fever and fatigue.  HENT: Negative for congestion and drooling.   Eyes: Negative for pain.  Respiratory: Positive for cough and shortness of breath.   Cardiovascular: Positive for chest pain.  Gastrointestinal: Negative for nausea, vomiting, abdominal pain and diarrhea.  Genitourinary: Negative for dysuria and hematuria.  Musculoskeletal: Negative for back pain, gait problem and neck pain.  Skin: Negative  for color change.  Neurological: Positive for headaches. Negative for dizziness.  Hematological: Negative for adenopathy.  Psychiatric/Behavioral: Negative for behavioral problems.  All other systems reviewed and are negative.     Allergies  Review of patient's allergies indicates no known allergies.  Home Medications   Prior to Admission medications   Medication Sig Start Date End Date Taking? Authorizing Provider  albuterol (PROVENTIL HFA;VENTOLIN HFA) 108 (90 BASE) MCG/ACT inhaler Inhale 2 puffs into the lungs every 4 (four) hours as needed for wheezing or shortness of breath. 06/21/14    Christiane Ha, MD  azaTHIOprine (IMURAN) 50 MG tablet Take 5 tablets (250 mg total) by mouth daily. 06/29/14   Lora Paula, MD  celecoxib (CELEBREX) 200 MG capsule Take 200 mg by mouth 2 (two) times daily.    Historical Provider, MD  clobetasol (TEMOVATE) 0.05 % external solution Apply 1 application topically 2 (two) times daily. Apply to scalp 07/03/14   Josalyn C Funches, MD  FLUoxetine (PROZAC) 20 MG tablet Take 20 mg by mouth daily.    Historical Provider, MD  hydroxychloroquine (PLAQUENIL) 200 MG tablet Take 1 tablet (200 mg total) by mouth 2 (two) times daily. 06/29/14   Lora Paula, MD  hydrOXYzine (ATARAX/VISTARIL) 10 MG tablet Take 1 tablet (10 mg total) by mouth every 8 (eight) hours as needed for itching. 05/14/14   Josalyn C Funches, MD  mometasone-formoterol (DULERA) 100-5 MCG/ACT AERO Take 2 puffs first thing in am and then another 2 puffs about 12 hours later. 07/22/14   Nyoka Cowden, MD  oxyCODONE-acetaminophen (PERCOCET/ROXICET) 5-325 MG per tablet Take 1 tablet by mouth every 4 (four) hours as needed. 04/15/14   Historical Provider, MD  predniSONE (DELTASONE) 10 MG tablet Take 1 tablet (10 mg total) by mouth daily with breakfast. 07/13/14   Lora Paula, MD  rivaroxaban (XARELTO) 20 MG TABS tablet Take 1 tablet (20 mg total) by mouth daily with supper. 04/02/14   Josalyn C Funches, MD  traMADol (ULTRAM) 50 MG tablet Take 1 tablet (50 mg total) by mouth every 8 (eight) hours as needed for moderate pain. 06/29/14   Josalyn C Funches, MD  triamcinolone cream (KENALOG) 0.1 % Apply 1 application topically 2 (two) times daily. Apply to areas of psoriasis 06/29/14   Josalyn C Funches, MD   BP 107/72 mmHg  Pulse 119  Temp(Src) 99.7 F (37.6 C) (Oral)  Resp 21  SpO2 98%  LMP 07/12/2014 Physical Exam  Constitutional: She is oriented to person, place, and time. She appears well-developed and well-nourished.  HENT:  Head: Normocephalic.  Mouth/Throat: Oropharynx is clear and  moist. No oropharyngeal exudate.  Eyes: Conjunctivae and EOM are normal. Pupils are equal, round, and reactive to light.  Neck: Normal range of motion. Neck supple.  Cardiovascular: Regular rhythm, normal heart sounds and intact distal pulses.  Exam reveals no gallop and no friction rub.   No murmur heard. Pulmonary/Chest: No respiratory distress.  Faint end expiratory wheeze heard bilaterally.  Abdominal: Soft. Bowel sounds are normal. There is no tenderness. There is no rebound and no guarding.  Musculoskeletal: Normal range of motion. She exhibits tenderness. She exhibits no edema.  Mild bruising to the mid right shin with mild tenderness to palpation.  Right shoulder pain which is reproducible with range of motion of the shoulder.  Neurological: She is alert and oriented to person, place, and time.  alert, oriented x3 speech: normal in context and clarity memory: intact grossly cranial nerves II-XII:  intact motor strength: full proximally and distally no involuntary movements or tremors sensation: intact to light touch diffusely  cerebellar: finger-to-nose and heel-to-shin intact gait: antalgic gait, otherwise normal  Skin: Skin is warm and dry.  Diffuse macular papular psoriatic appearing rash.  Psychiatric: She has a normal mood and affect. Her behavior is normal.  Nursing note and vitals reviewed.   ED Course  Procedures (including critical care time) Labs Review Labs Reviewed  CBC - Abnormal; Notable for the following:    RBC 5.22 (*)    Hemoglobin 15.5 (*)    HCT 48.7 (*)    All other components within normal limits  BASIC METABOLIC PANEL - Abnormal; Notable for the following:    Glucose, Bld 139 (*)    All other components within normal limits  D-DIMER, QUANTITATIVE - Abnormal; Notable for the following:    D-Dimer, Quant 0.72 (*)    All other components within normal limits  I-STAT TROPOININ, ED  POC URINE PREG, ED                                       0.72 (*)    All other components within normal limits  I-STAT TROPOININ, ED  POC URINE PREG, ED    Imaging Review Dg Chest 2 View  07/28/2014   CLINICAL DATA:  Cough and shortness of breath for 1 week, fever  EXAM: CHEST  2 VIEW  COMPARISON:  06/20/2014  FINDINGS: The heart size and mediastinal contours are within normal limits. Elevation of the right hemidiaphragm reidentified. Trace pleural fluid or thickening. Lungs are hypoaerated with crowding of the bronchovascular markings. The visualized skeletal structures are unremarkable.  IMPRESSION: Lungs are hypoaerated with crowding of the bronchovascular markings. No focal pulmonary opacity. If symptoms persist, consider PA and lateral chest radiographs obtained at full inspiration when the patient is clinically able.   Electronically Signed   By: Christiana Pellant M.D.   On: 07/28/2014 21:22     EKG Interpretation   Date/Time:  Tuesday July 28 2014 21:00:26 EST Ventricular Rate:  117 PR Interval:  124 QRS Duration: 84 QT Interval:  334 QTC Calculation: 465 R Axis:   71 Text Interpretation:  Sinus tachycardia No significant change since last  tracing Confirmed by Taksh Hjort  MD, Jamion Carter ([id number]) on 07/29/2014 12:01:38 AM      MDM   Final diagnoses:  SOB (shortness of breath)  Other chest pain  Bad headache    10:28 PM 29 y.o. female w hx of hypertension, morbid obesity, asthma, interstitial lung disease, pulmonary embolism in May 15 on xarelto, SLE, arthritis on immunosupp who pw multiple complaints. She notes that she began having pleuritic type chest pain 2 weeks ago and developed shortness of breath about one week ago. She also complains of a frontal headache which began 3-4 days ago and was gradual in onset. She has had a cough for the last few weeks as well. She notes subjective fevers but no documented fever. She is afebrile and mildly tachycardic on my exam. She reportedly has a history of PE diagnosed in May 2015. She has multiple  previous CT angios studies here which are negative for PE. She has presented with similar complaints multiple times. Will start with a screening d-dimer to try to avoid another CT scan given that she is moderate risk per Wells criteria. She has a normal neurologic exam. We'll treat  symptomatically.  D-dimer elevated. Pt's sx improved on exam. Transferred care to Dr. Norlene Campbell w/ plan for d/c home if CTA neg for PE.     Purvis Sheffield, MD 07/29/14 [id number]

## 2014-07-29 ENCOUNTER — Encounter (HOSPITAL_COMMUNITY): Payer: Self-pay

## 2014-07-29 ENCOUNTER — Other Ambulatory Visit: Payer: Self-pay | Admitting: *Deleted

## 2014-07-29 ENCOUNTER — Emergency Department (HOSPITAL_COMMUNITY): Payer: Self-pay

## 2014-07-29 DIAGNOSIS — M329 Systemic lupus erythematosus, unspecified: Secondary | ICD-10-CM

## 2014-07-29 MED ORDER — HYDROMORPHONE HCL 1 MG/ML IJ SOLN
0.5000 mg | Freq: Once | INTRAMUSCULAR | Status: AC
  Administered 2014-07-29: 0.5 mg via INTRAVENOUS
  Filled 2014-07-29: qty 1

## 2014-07-29 MED ORDER — PREDNISONE 10 MG PO TABS: 10.0000 mg | ORAL_TABLET | Freq: Every day | ORAL | Status: DC

## 2014-07-29 MED ORDER — ALBUTEROL SULFATE (2.5 MG/3ML) 0.083% IN NEBU
5.0000 mg | INHALATION_SOLUTION | Freq: Once | RESPIRATORY_TRACT | Status: AC
  Administered 2014-07-29: 5 mg via RESPIRATORY_TRACT
  Filled 2014-07-29: qty 6

## 2014-07-29 MED ORDER — IOHEXOL 350 MG/ML SOLN
100.0000 mL | Freq: Once | INTRAVENOUS | Status: AC | PRN
  Administered 2014-07-29: 100 mL via INTRAVENOUS

## 2014-07-29 NOTE — ED Notes (Signed)
Pt a/o x 4 on d/c in wheelchair. 

## 2014-07-29 NOTE — Discharge Instructions (Signed)
Your workup today has not shown any new blood clots in your lungs.  Continue taking your medications as prescribed.  Follow-up with your Dr. for recheck in 2-3 days.  Return to the emergency department for worsening condition or new concerning symptoms.   Observacin por dolor en el pecho (Chest Pain Observation) Con frecuencia es difcil dar un diagnstico especfico de la causa del dolor de [place]. Entre otras posibilidades, la causa de los sntomas puede ser un inadecuado suministro de oxgeno al corazn (angina). La angina que no se trata o evala puede conducir a un ataque cardaco (infarto de miocardio) o a la muerte. Le indicarn anlisis de [employer], electrocardiogramas y radiografas para Production assistant, radio causa posible del dolor en el pecho. Despus de la evaluacin y la observacin, el mdico ha determinado de que es improbable que la causa de su dolor se deba a una afeccin inestable que requiera hospitalizacin. Sin embargo, es Control and instrumentation engineer del dolor, con pruebas diagnsticas adicionales, segn las indicaciones. Es muy importante asistir a todas las Merchant navy officer de seguimiento. No cumplir con el seguimiento puede resultar en un dao cardaco permanente, incapacidad o la muerte. Si hay algn problema para cumplir con los controles, comunquelo a su mdico. INSTRUCCIONES PARA EL CUIDADO EN EL HOGAR  Debido a la ligera probabilidad de que Chief Technology Officer sea debido a una angina, es Teaching laboratory technician plan de tratamiento que le indique el mdico y Set designer un estilo de vida saludable:  Mantenga un peso saludable o trate de Teacher, English as a foreign language.  Mantngase fsicamente activo y haga ejercicios con regularidad.  Limite el consumo de sal.  Consuma una dieta saludable y equilibrada. Pngase en contacto con un nutricionista para aprender cuales son los alimentos saludables.  Aumente la ingesta de fibra incluyendo granos enteros, vegetales, frutas y frutos secos en la dieta.  Evite  situaciones que causan estrs, enojo o depresin.  Tome todos los medicamentos como le indic el mdico. Informe cualquier efecto secundario a su mdico. No suspenda la medicacin ni ajuste las dosis por su cuenta.  Deje de fumar. No use parches ni goma de mascar con nicotina hasta que lo haya hablado con el profesional.  Mantenga la presin arterial, el nivel de azcar en la sangre y colesterol dentro de lmites normales.  Limite el consumo de alcohol a no ms de 1 medida por da en las mujeres no embarazadas y 2 medidas en los hombres.  No consuma drogas. SOLICITE ATENCIN MDICA DE INMEDIATO SI: Siente dolor intenso u opresin en el pecho adems de otros sntomas como:  Dolor o presin [employer], [place], [place] o cuello.  Siente dolor intenso en la espalda, en el abdomen, tiene Programme researcher, broadcasting/film/video (nuseas), o devuelve (vomita).  Transpira profusamente.  Siente latidos cardacos rpidos o irregulares.  Le falta el aire estando en reposo.  Siente falta de aire estando en reposo, durmiendo o al Education officer, environmental.  Siente dolor en el pecho que no mejora con el reposo o despus de tomar sus medicamentos habituales.  Se despierta con dolor en el pecho.  No puede dormir porque no puede respirar.  Comienza a tener una tos intensa o elimina sangre al toser.  Se siente mareado, se desmaya o experimenta fatiga extrema.  Desarrolla una debilidad extrema, se desmaya, tiene mareos o escalofros. Cualquiera de estos sntomas puede representar un problema grave y es Radio broadcast assistant.No espere para ver si los sntomas desaparecen. Comunquese con el servicio de emergencias de su localidad (911 en los [place]).Noconduzca por sus propios medios [employer]. ASEGRESE DE QUE:  Comprende estas instrucciones.  Controlar su afeccin.  Recibir ayuda de inmediato si no mejora o si empeora. Document Released: 09/27/2010 Document Revised: 02/12/2013 [facility] Patient  Information 2015 [place], [place]. This information is not intended to replace advice given to you by your health care provider. Make sure you discuss any questions you have with your health care provider.  Dolor de cabeza general sin causa  (General Headache Without Cause)   EL dolor de cabeza es un dolor o malestar que se siente en la zona de la cabeza o del cuello. Puede no tener una causa especfica. Hay muchas causas y tipos de dolores de [place]. Los ms comunes son:   Cefalea tensional.  Cefaleas migraosas.  Cefalea en brotes.  Cefaleas diarias crnicas. INSTRUCCIONES PARA EL CUIDADO EN EL HOGAR   Cumpla con todas las citas programadas con su mdico o con el especialista al que lo hayan derivado.  Slo tome medicamentos de venta libre o recetados para Primary school teacher o Environmental health practitioner, segn las indicaciones de su mdico.  Cuando sienta dolor de cabeza acustese en un cuarto oscuro y tranquilo.  Lleve un registro diario para Financial risk analyst lo que Press photographer. Por ejemplo, escriba:  Lo que come y bebe.  Cunto tiempo duerme.  Todo cambio en la dieta o medicamentos.  Intente con masajes u otras tcnicas de relajacin.  Colquese compresas de hielo o calor en la cabeza y en el cuello. selos 3 a 4 veces por da de 15 a 20 minutos por vez, o como sea necesario.  Limite las situaciones de estrs.  Sintese con la espalda recta y no  tense los msculos.  Si fuma, deje de hacerlo.  Limite el consumo de bebidas alcohlicas.  Consuma menos cantidad de cafena o deje de tomarla.  Coma y duerma en horarios regulares.  Duerma entre 7 y 9 horas o como lo indique su mdico.  Dietitian las luces tenues si le molestan las luces brillantes y Hospital doctor dolor de [place]. SOLICITE ATENCIN MDICA SI:   Tiene problemas con los Arboriculturist.  Los medicamentos no Education officer, environmental.  El dolor de cabeza que senta habitualmente es diferente.  Tiene nuseas o  vmitos. SOLICITE ATENCIN MDICA DE INMEDIATO SI:   El dolor se hace cada vez ms intenso.  Tiene fiebre.  Presenta rigidez en el cuello.  Sufre prdida de la visin.  Presenta debilidad muscular o prdida del control muscular.  Comienza a perder el equilibrio o tiene problemas para caminar.  Sufre mareos o se desmaya.  Tiene sntomas graves que son diferentes a los primeros sntomas. ASEGRESE DE QUE:   Comprende estas instrucciones.  Controlar su enfermedad.  Solicitar ayuda de inmediato si no mejora o empeora. Document Released: 03/22/2005 Document Revised: 12/12/2011 [facility] Patient Information 2015 [place], [place]. This information is not intended to replace advice given to you by your health care provider. Make sure you discuss any questions you have with your health care provider.  Cefalea migraosa (Migraine Headache) Una cefalea migraosa es un dolor intenso y punzante en uno o ambos lados de la cabeza. La migraa puede durar desde 30 minutos hasta varias horas. CAUSAS  No siempre se conoce la causa exacta de la cefalea migraosa. Sin embargo, IT consultant [employer] nervios del cerebro se irritan y liberan ciertas sustancias qumicas que causan inflamacin. Esto ocasiona dolor. Existen tambin ciertos factores que pueden desencadenar las [place], [place]  siguientes:  Alcohol.  Fumar.  Estrs.  La menstruacin  Quesos aejados.  Los alimentos o las bebidas que contienen nitratos, glutamato, aspartamo o tiramina.  Falta de sueo.  Chocolate.  Cafena.  Hambre.  Actividad fsica extenuante.  Fatiga.  Medicamentos que se usan para tratar Aeronautical engineer (nitroglicerina), pldoras anticonceptivas, estrgeno y algunos medicamentos para la hipertensin arterial. SIGNOS Y SNTOMAS  Dolor en uno o ambos lados de la cabeza.  Dolor pulsante o punzante.  Dolor intenso que impide [employer] actividades diarias.  Dolor que se agrava por  cualquier actividad fsica.  Nuseas, vmitos o ambos.  Mareos.  Dolor con la exposicin a las luces brillantes, a los ruidos fuertes o la [place].  Sensibilidad general a las luces brillantes, a los ruidos fuertes o a los [employer]. Antes de sufrir una migraa, puede recibir seales de advertencia (aura). Un aura puede incluir:  Ver las luces intermitentes.  Ver puntos brillantes, halos o lneas en zigzag.  Tener una visin en tnel o visin borrosa.  Sensacin de entumecimiento u hormigueo.  Dificultad para hablar  Debilidad muscular. DIAGNSTICO  La cefalea migraosa se diagnostica en funcin de lo siguiente:  Sntomas.  Examen fsico.  Neomia Dear TC (tomografa computada) o resonancia magntica de la cabeza. Estas pruebas de diagnstico por imagen no pueden diagnosticar las migraas, pero pueden ayudar a Sales promotion account executive otras causas de las cefaleas. TRATAMIENTO Le prescribirn medicamentos para Engineer, materials y las nuseas. Tambin podrn administrarse medicamentos para ayudar a Armed forces training and education officer.  INSTRUCCIONES PARA EL CUIDADO EN EL HOGAR  Slo tome medicamentos de venta libre o recetados para Primary school teacher o Environmental health practitioner, segn las indicaciones de su mdico. No se recomienda usar los opiceos a Air cabin crew.  Cuando tenga la migraa, acustese en un cuarto oscuro y tranquilo  Lleve un registro diario para Financial risk analyst lo que puede provocar las Soil scientist. Por ejemplo, escriba:  Lo que usted come y bebe.  Cunto tiempo duerme.  Algn cambio en su dieta o en los medicamentos.  Limite el consumo de bebidas alcohlicas.  Si fuma, deje de hacerlo.  Duerma entre 7 y 9horas, o segn las recomendaciones del mdico.  Limite el estrs.  Mantenga las luces tenues si le [employer] luces brillantes y la [place]. SOLICITE ATENCIN MDICA DE INMEDIATO SI:   La migraa se hace cada vez ms intensa.  Tiene fiebre.  Presenta rigidez en el  cuello.  Tiene prdida de visin.  Presenta debilidad muscular o prdida del control muscular.  Comienza a perder el equilibrio o tiene problemas para caminar.  Sufre mareos o se desmaya.  Tiene sntomas graves que son diferentes a los primeros sntomas. ASEGRESE DE QUE:   Comprende estas instrucciones.  Controlar su afeccin.  Recibir ayuda de inmediato si no mejora o si empeora. Document Released: 06/12/2005 Document Revised: 04/02/2013 [facility] Patient Information 2015 [place], [place]. This information is not intended to replace advice given to you by your health care provider. Make sure you discuss any questions you have with your health care provider.

## 2014-07-29 NOTE — Progress Notes (Unsigned)
Patient walked in to refill her prednisone.  It was determined that patient had been taking two 10mg  tablets q day instead of the instructed 10mg  tablet q day.  Spoke with Dr. who authroized medication refill.  Spoke with patient Via interpreter  To explain refill and instructions for taking medication.

## 2014-07-29 NOTE — ED Provider Notes (Signed)
Care assumed from Dr. Romeo Apple at change of shift.  Awaiting CT angio of chest.  Patient has remote history of PE, on xarelto mildly elevated d-dimer.  She has been having chest pain for 1 week, which is pleuritic in nature and headache for 1 week.  Patient has normal neuro exam.  She received headache cocktail.  Patient is stable for discharge.  If CT angiogram is negative.   Results for orders placed or performed during the hospital encounter of 07/28/14  CBC  Result Value Ref Range   WBC 8.6 4.0 - 10.5 K/uL   RBC 5.22 (H) 3.87 - 5.11 MIL/uL   Hemoglobin 15.5 (H) 12.0 - 15.0 g/dL   HCT 16.1 (H) 09.6 - 04.5 %   MCV 93.3 78.0 - 100.0 fL   MCH 29.7 26.0 - 34.0 pg   MCHC 31.8 30.0 - 36.0 g/dL   RDW 40.9 81.1 - 91.4 %   Platelets 260 150 - 400 K/uL  Basic metabolic panel  Result Value Ref Range   Sodium 139 135 - 145 mmol/L   Potassium 3.8 3.5 - 5.1 mmol/L   Chloride 105 96 - 112 mmol/L   CO2 26 19 - 32 mmol/L   Glucose, Bld 139 (H) 70 - 99 mg/dL   BUN 13 6 - 23 mg/dL   Creatinine, Ser 7.82 0.50 - 1.10 mg/dL   Calcium 8.8 8.4 - 95.6 mg/dL   GFR calc non Af Amer >90 >90 mL/min   GFR calc Af Amer >90 >90 mL/min   Anion gap 8 5 - 15  D-dimer, quantitative  Result Value Ref Range   D-Dimer, Quant 0.72 (H) 0.00 - 0.48 ug/mL-FEU  I-stat troponin, ED (not at [facility])  Result Value Ref Range   Troponin i, poc 0.00 0.00 - 0.08 ng/mL   Comment 3          POC Urine Pregnancy, ED (do NOT order at [facility])  Result Value Ref Range   Preg Test, Ur NEGATIVE NEGATIVE   Dg Chest 2 View  07/28/2014   CLINICAL DATA:  Cough and shortness of breath for 1 week, fever  EXAM: CHEST  2 VIEW  COMPARISON:  06/20/2014  FINDINGS: The heart size and mediastinal contours are within normal limits. Elevation of the right hemidiaphragm reidentified. Trace pleural fluid or thickening. Lungs are hypoaerated with crowding of the bronchovascular markings. The visualized skeletal structures are unremarkable.  IMPRESSION: Lungs  are hypoaerated with crowding of the bronchovascular markings. No focal pulmonary opacity. If symptoms persist, consider PA and lateral chest radiographs obtained at full inspiration when the patient is clinically able.   Electronically Signed   By: Christiana Pellant M.D.   On: 07/28/2014 21:22   Ct Angio Chest Pe W/cm &/or Wo Cm  07/29/2014   CLINICAL DATA:  Shortness of breath. Central chest pain radiating to the back.  EXAM: CT ANGIOGRAPHY CHEST WITH CONTRAST  TECHNIQUE: Multidetector CT imaging of the chest was performed using the standard protocol during bolus administration of intravenous contrast. Multiplanar CT image reconstructions and MIPs were obtained to evaluate the vascular anatomy.  CONTRAST:  OMNIPAQUE IOHEXOL 350 MG/ML SOLN  COMPARISON:  Radiographs 1 day prior. Chest CT PE protocol 03/11/2014  FINDINGS: There are no filling defects within the central pulmonary arteries to suggest pulmonary embolus. Lower lobe subsegmental branches are suboptimally assessed due soft tissue attenuation and motion artifact. The thoracic aorta is normal in caliber.  The heart is normal in size. There is no pleural  or pericardial effusion. No mediastinal adenopathy. Decreased patchy and ground-glass opacities throughout the lungs from prior exam. Mild residual ground-glass opacities in the left upper and medial left and right lower lobes. The 3 mm pulmonary nodule in the right upper lobe (image 21/82) is unchanged from prior exams. No new airspace disease.  Evaluation of the upper abdomen demonstrates no acute abnormality, probable gallstones are seen. A 2 cm left thyroid nodule is again seen. There are no acute osseous abnormalities.  Review of the MIP images confirms the above findings.  IMPRESSION: 1. No central pulmonary embolus. 2. Residual patchy and ground-glass opacities throughout both lungs, however improved from prior exam. This may be infectious or inflammatory versus small airways disease. 3. Stable 3  mm right upper lobe pulmonary nodule. 4. Left thyroid nodule measures 2 cm. Consider further evaluation with thyroid ultrasound. If patient is clinically hyperthyroid, consider nuclear medicine thyroid uptake and scan.   Electronically Signed   By: Rubye Oaks M.D.   On: 07/29/2014 00:41      Olivia Mackie, MD 07/29/14 [phone]

## 2014-08-05 ENCOUNTER — Ambulatory Visit (INDEPENDENT_AMBULATORY_CARE_PROVIDER_SITE_OTHER): Payer: Self-pay | Admitting: Internal Medicine

## 2014-08-05 ENCOUNTER — Encounter: Payer: Self-pay | Admitting: Internal Medicine

## 2014-08-05 VITALS — BP 126/74 | HR 116 | Ht 62.5 in | Wt 310.0 lb

## 2014-08-05 DIAGNOSIS — R51 Headache: Secondary | ICD-10-CM

## 2014-08-05 DIAGNOSIS — J45991 Cough variant asthma: Secondary | ICD-10-CM

## 2014-08-05 DIAGNOSIS — J8410 Pulmonary fibrosis, unspecified: Secondary | ICD-10-CM

## 2014-08-05 DIAGNOSIS — R519 Headache, unspecified: Secondary | ICD-10-CM

## 2014-08-05 MED ORDER — ALBUTEROL SULFATE HFA 108 (90 BASE) MCG/ACT IN AERS: 2.0000 | INHALATION_SPRAY | RESPIRATORY_TRACT | Status: DC | PRN

## 2014-08-05 NOTE — Progress Notes (Signed)
Subjective:     Patient ID: Donna Hall, Donna Hall   DOB: 03-10-1986,    MRN: [id number]     Brief patient profile:  29 yo latino Donna Hall never smoker followed previously at [facility] with dx of lupus on steroids x  Around 2011 on a floor of 5 mg daily and a ceiling of 20 mg referred by Dr Armen Pickup for ILD 07/22/2014 p admit  Admit date: 06/20/2014 Discharge date: 06/21/2014  Discharge Diagnoses:  Principal Problem:  Asthma exacerbation   INTERSTITIAL LUNG DISEASE  Psoriasis  Obesity, Class III, BMI 40-49.9 (morbid obesity)  Essential hypertension, benign  Pulmonary embolism  Anxiety and depression     History of Present Illness  07/22/2014 1st [facility] office visit/ Lanetra Hartley   Chief Complaint  Patient presents with  . Advice Only    Referred for Pulm Fibrosis eval: Pt has cough with yellow tinge with small blood. Pt  has sore throat. Pt denies chest tightness but does have wheezing. Pt took Nyquil last night for sore throat.  presently on 10 mg per day with active arthritis hands/ shoulders/ knees worse than usual  Cough x one month minimally discolored assoc with overt HB and nasal congestion  Symptoms better for a few hours p saba  rec Start dulera 100 Take 2 puffs first thing in am and then another 2 puffs about 12 hours later.  Try over the counter prilosec 20mg   Take 30-60 min before first meal of the day and Pepcid 20 mg one bedtime until  Return GERD diet    08/05/2014 f/u ov/Gabrella Stroh re: sob/cough / lupus on dulera 100 2bid but counter on 23  Chief Complaint  Patient presents with  . Follow-up    Pt states her cough is worse since the last visit- prod with yellow sputum.  She c/o swelling under her left eye and HA for the past 2 days.    confused with instructions re prednisone rx from er 08/03/14 whereas on 10 mg daily  at baseline since 08/03/14  Now on 50 mg daily and taper abruptly to zero was not the intent   Did not understand maint dulera vs prn  saba not the same though given instructions in spanish/ written copy in spanish apparently can't read that well even in spanish and depends on others to read for her rx with zpak by er 08/03/14   No obvious day to day or daytime variabilty or assoc   cp or chest tightness, subjective wheeze   symptoms. No unusual exp hx or h/o childhood pna/ asthma or knowledge of premature birth.  Sleeping ok without nocturnal  or early am exacerbation  of respiratory  c/o's or need for noct saba. Also denies any obvious fluctuation of symptoms with weather or environmental changes or other aggravating or alleviating factors except as outlined above   Current Medications, Allergies, Complete Past Medical History, Past Surgical History, Family History, and Social History were reviewed in 10/02/14 record.  ROS  The following are not active complaints unless bolded sore throat, dysphagia, dental problems, itching, sneezing,  nasal congestion or excess/ purulent secretions, ear ache,   fever, chills, sweats, unintended wt loss, pleuritic or exertional cp, hemoptysis,  orthopnea pnd or leg swelling, presyncope, palpitations, heartburn, abdominal pain, anorexia, nausea, vomiting, diarrhea  or change in bowel or urinary habits, change in stools or urine, dysuria,hematuria,  rash, arthralgias, visual complaints, headache, numbness weakness or ataxia or problems with walking or coordination,  change  in mood/affect or memory.                 Objective:   Physical Exam   Extremely cushingnoid latino Donna Hall nad with harsh upper airway coughing    08/05/2014       310  Wt Readings from Last 3 Encounters:  07/22/14 309 lb (140.161 kg)  07/13/14 305 lb (138.347 kg)  07/03/14 303 lb (137.44 kg)    Vital signs reviewed    HEENT: nl dentition, turbinates, and orophanx. Nl external ear canals without cough reflex   NECK :  without JVD/Nodes/TM/ nl carotid upstrokes bilaterally   LUNGS:  no acc muscle use, a few insp/ exp rhonchi bilaterally  CV:  RRR  no s3 or murmur or increase in P2, no edema   ABD:  soft and nontender with nl excursion in the supine position. No bruits or organomegaly, bowel sounds nl  MS:  warm without deformities, calf tenderness, cyanosis or clubbing  SKIN: warm and dry without lesions    NEURO:  alert, approp, no deficits     CXR:  07/21/13  I personally reviewed images and agree with radiology impression as follows:   Interstitial edema  similar to prior. Chronic elevation right hemidiaphragm.      Assessment:

## 2014-08-05 NOTE — Patient Instructions (Addendum)
Continue dulera 100 Take 2 puffs first thing in am and then another 2 puffs about 12 hours later.   Finish the zithromax  Reduce the prednisone to 10 x 2 each am   Please see patient coordinator before you leave today  to schedule sinus CT asap   For breathing > Only use your albuterol (proventil/ yellow) as a rescue medication to be used if you can't catch your breath by resting or doing a relaxed purse lip breathing pattern.  - The less you use it, the better it will work when you need it. - Ok to use up to 2 puffs  every 4 hours if you must but call for immediate appointment if use goes up over your usual need - Don't leave home without it !!  (think of it like the spare tire for your car)   For cough or pain anywhere take tramadol 50 up to 1-2 every 4 hours as needed   Please schedule a follow up office visit in 2 weeks, sooner if needed with all your medications including over the counter meds and see your primary doctor as soon as possible to continue to coordinate all your care    Continuar Dulera 100 Take 2 inhalaciones a primera hora de la maana y [place] otros 2 soplos sobre 12 horas despus.  Termina el Zithromax  Reducir la prednisona a 10 x 2 cada uno am  Por favor, vase el coordinador de pacientes antes de salir hoy para programar TC de senos antes posible  Para respirar> Utilice slo el albuterol (Proventil / amarillo) como medicacin de rescate que se utilizar si no se puede Paramedic al descansar o hacer un patrn del monedero labio respiracin relajada. - Cuanto menos se use, mejor funcionar cuando lo necesite. - Ok para Chemical engineer un mximo de 2 inhalaciones cada 4 horas si es necesario, pero llame para una cita inmediata en caso de utilizacin sube por encima de su necesidad habitual - No salga de casa sin ella !! (Pensar en l como el neumtico de repuesto para su coche)  Para la tos o dolor en cualquier parte tome tramadol 50 hasta 1-2 cada 4 horas  segn sea necesario  Por favor, programar una visita de seguimiento de oficinas en 2 semanas, antes si es necesario con todos sus medicamentos incluyendo medicamentos de venta [employer] y ver a su mdico de cabecera tan pronto como sea posible para seguir coordinar toda su atencin   S

## 2014-08-06 ENCOUNTER — Ambulatory Visit (INDEPENDENT_AMBULATORY_CARE_PROVIDER_SITE_OTHER)
Admission: RE | Admit: 2014-08-06 | Discharge: 2014-08-06 | Disposition: A | Discharge status: Home / Self Care | Payer: Self-pay | Source: Ambulatory Visit | Attending: Internal Medicine | Admitting: Internal Medicine

## 2014-08-06 ENCOUNTER — Encounter: Payer: Self-pay | Admitting: Internal Medicine

## 2014-08-06 DIAGNOSIS — R51 Headache: Secondary | ICD-10-CM

## 2014-08-06 DIAGNOSIS — R519 Headache, unspecified: Secondary | ICD-10-CM

## 2014-08-06 NOTE — Assessment & Plan Note (Addendum)
Features typical of tension > sinus source   But need Sinus ct as first step not more abx which just risk setting up resistance  In meantime ok to use tramadol prn   See instructions for specific recommendations which were reviewed directly with the patient who was given a copy with highlighter outlining the key components.

## 2014-08-06 NOTE — Assessment & Plan Note (Signed)
07/22/2014  try dulera 100 2bid sample only  08/05/14  p extensive coaching HFA effectiveness =    75% > continue dulera 100 2bid   I had an extended discussion with the patient through interpretor reviewing all relevant studies completed to date and  lasting 15 to 20 minutes of a 25 minute visit on the following ongoing concerns:   1) I see no evidence of bad asthma or lupus pneumonitis  2) most of her cough appears to be upper airway in nature and don't want to use higher ICS than dulera 100 as may paradoxically add to coughing  3) need to eliminate cyclical coughing to the extent possible with tramadol first, then regroup  4) Each maintenance medication was reviewed in detail including most importantly the difference between maintenance and as needed and under what circumstances the prns are to be used.  Please see instructions for details which were reviewed in writing and the patient given a copy.

## 2014-08-06 NOTE — Assessment & Plan Note (Signed)
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  For now therefore I recommend the patient maintain  A floor of 20 mg per day now

## 2014-08-13 ENCOUNTER — Other Ambulatory Visit: Payer: Self-pay

## 2014-08-19 ENCOUNTER — Ambulatory Visit (INDEPENDENT_AMBULATORY_CARE_PROVIDER_SITE_OTHER): Payer: Self-pay | Admitting: Internal Medicine

## 2014-08-19 ENCOUNTER — Encounter: Payer: Self-pay | Admitting: Internal Medicine

## 2014-08-19 VITALS — BP 100/72 | HR 123 | Temp 97.3°F | Ht 62.5 in | Wt 315.8 lb

## 2014-08-19 DIAGNOSIS — M329 Systemic lupus erythematosus, unspecified: Secondary | ICD-10-CM

## 2014-08-19 DIAGNOSIS — J8410 Pulmonary fibrosis, unspecified: Secondary | ICD-10-CM

## 2014-08-19 DIAGNOSIS — J45991 Cough variant asthma: Secondary | ICD-10-CM

## 2014-08-19 MED ORDER — PREDNISONE 10 MG PO TABS: 10.0000 mg | ORAL_TABLET | Freq: Every day | ORAL | Status: DC

## 2014-08-19 MED ORDER — TRAMADOL HCL 50 MG PO TABS: ORAL_TABLET | ORAL | Status: DC

## 2014-08-19 NOTE — Progress Notes (Signed)
Subjective:     Patient ID: Donna Hall, female   DOB: January 06, 1986,    MRN: [id number]     Brief patient profile:  29 yo latino female never smoker followed previously at [facility] with dx of lupus on steroids x  Around 2011 on a floor of 5 mg daily and a ceiling of 20 mg referred by Dr Armen Pickup for ILD 07/22/2014 p admit  Admit date: 06/20/2014 Discharge date: 06/21/2014  Discharge Diagnoses:  Principal Problem:  Asthma exacerbation   INTERSTITIAL LUNG DISEASE  Psoriasis  Obesity, Class III, BMI 40-49.9 (morbid obesity)  Essential hypertension, benign  Pulmonary embolism  Anxiety and depression     History of Present Illness  07/22/2014 1st [facility] office visit/ Josepha Barbier   Chief Complaint  Patient presents with  . Advice Only    Referred for Pulm Fibrosis eval: Pt has cough with yellow tinge with small blood. Pt  has sore throat. Pt denies chest tightness but does have wheezing. Pt took Nyquil last night for sore throat.  presently on 10 mg per day with active arthritis hands/ shoulders/ knees worse than usual  Cough x one month minimally discolored assoc with overt HB and nasal congestion  Symptoms better for a few hours p saba  rec Start dulera 100 Take 2 puffs first thing in am and then another 2 puffs about 12 hours later.  Try over the counter prilosec 20mg   Take 30-60 min before first meal of the day and Pepcid 20 mg one bedtime until  Return GERD diet    08/05/2014 f/u ov/Malia Corsi re: sob/cough / lupus on dulera 100 2bid but counter on 23  Chief Complaint  Patient presents with  . Follow-up    Pt states her cough is worse since the last visit- prod with yellow sputum.  She c/o swelling under her left eye and HA for the past 2 days.   confused with instructions re prednisone rx from er 08/03/14 whereas on 10 mg daily  at baseline since 08/03/14  Now on 50 mg daily and taper abruptly to zero was not the intent   Did not understand maint dulera vs prn saba  not the same though given instructions in spanish/ written copy in spanish apparently can't read that well even in spanish and depends on others to read for her rx with zpak by er 08/03/14  rec Continue dulera 100 Take 2 puffs first thing in am and then another 2 puffs about 12 hours later.  Finish the zithromax Reduce the prednisone to 10 x 2 each am  Please see patient coordinator before you leave today  to schedule sinus CT asap  For breathing > Only use your albuterol (proventil/ yellow) as a rescue medication  For cough or pain anywhere take tramadol 50 up to 1-2 every 4 hours as needed  [place] schedule a follow up office visit in 2 weeks, sooner if needed with all your medications including over the counter meds and see your primary doctor as soon as possible to continue to coordinate all your care      08/19/2014 f/u ov/Kamal Jurgens re: cough / restrictive dz ? Lupus or all obesity?   Chief Complaint  Patient presents with  . Follow-up    pt says breathing better  very confused with with all of her instructions/ multiple duplicate bottles with just a few pills left   Prednisone back down to 10 mg per day she says but really unclear on this also  No obvious day to day or daytime variabilty or assoc  Cough or cp or chest tightness, subjective wheeze   symptoms. No unusual exp hx or h/o childhood pna/ asthma or knowledge of premature birth.  Sleeping ok without nocturnal  or early am exacerbation  of respiratory  c/o's or need for noct saba. Also denies any obvious fluctuation of symptoms with weather or environmental changes or other aggravating or alleviating factors except as outlined above   Current Medications, Allergies, Complete Past Medical History, Past Surgical History, Family History, and Social History were reviewed in [employer] record.  ROS  The following are not active complaints unless bolded sore throat, dysphagia, dental problems, itching, sneezing,   nasal congestion or excess/ purulent secretions, ear ache,   fever, chills, sweats, unintended wt loss, pleuritic or exertional cp, hemoptysis,  orthopnea pnd or leg swelling, presyncope, palpitations, heartburn, abdominal pain, anorexia, nausea, vomiting, diarrhea  or change in bowel or urinary habits, change in stools or urine, dysuria,hematuria,  rash, arthralgias, visual complaints, headache, numbness weakness or ataxia or problems with walking or coordination,  change in mood/affect or memory.                 Objective:   Physical Exam   Extremely cushingnoid latino female nad     08/05/2014       310  > 08/19/2014  315 Wt Readings from Last 3 Encounters:  07/22/14 309 lb (140.161 kg)  07/13/14 305 lb (138.347 kg)  07/03/14 303 lb (137.44 kg)    Vital signs reviewed    HEENT: nl dentition, turbinates, and orophanx. Nl external ear canals without cough reflex   NECK :  without JVD/Nodes/TM/ nl carotid upstrokes bilaterally   LUNGS: no acc muscle use, a few insp/ exp rhonchi bilaterally  CV:  RRR  no s3 or murmur or increase in P2, no edema   ABD:  soft and nontender with nl excursion in the supine position. No bruits or organomegaly, bowel sounds nl  MS:  warm without deformities, calf tenderness, cyanosis or clubbing  SKIN: warm and dry without lesions    NEURO:  alert, approp, no deficits     CXR:  07/21/13  I personally reviewed images and agree with radiology impression as follows:   Interstitial edema  similar to prior. Chronic elevation right hemidiaphragm.      Assessment:

## 2014-08-19 NOTE — Patient Instructions (Addendum)
Continue dulera 100 Take 2 puffs first thing in am and then another 2 puffs about 12 hours later.   Work on inhaler technique:  relax and gently blow all the way out then take a nice smooth deep breath back in, triggering the inhaler at same time you start breathing in.  Hold for up to 5 seconds if you can.  Rinse and gargle with water when done  Continue the prednisone to 10  Mg daily   Continue Try prilosec 20mg   Take 30-60 min before first meal of the day and Pepcid 20 mg one bedtime    For breathing > Only use your albuterol (ventolin) as a rescue medication to be used if you can't catch your breath by resting or doing a relaxed purse lip breathing pattern.  - The less you use it, the better it will work when you need it. - Ok to use up to 2 puffs  every 4 hours if you must but call for immediate appointment if use goes up over your usual need - Don't leave home without it !!  (think of it like the spare tire for your car)   For cough or pain anywhere take tramadol 50 up to 1-2 every 4 hours as needed   See Funches within 2 weeks and I will send her all my thoughts but please take all your medications with you   Continuar Dulera 100 Take 2 inhalaciones a primera hora de la maana y luego a otro 2 bocanadas aproximadamente 12 horas ms tarde. El trabajo en la tcnica de inhalacin: relajarse y soplar suavemente todo el camino a continuacin, un buen lisa tomar una respiracin profunda de nuevo, provocando inhalador, al mismo tiempo que empieza a Mantenga la posicin durante unos 5 segundos si se puede.Visual merchandiser con agua y hacer grgaras cuando se hace Continuar con la prednisona 10 mg al da Rennie Natter de 20 mg Prilosec Tome 30-60 minutos antes de la primera comida del da y Pepcid 20 mg una hora de dormir Para respirar> Slo use su albuterol (Ventolin) como medicacin de rescate que se utilizar si no se puede recuperar el aliento al descansar o hacer un patrn de respiracin del  monedero de labios relajados. - Cuanto menos que lo utilice, mejor funcionar cuando lo necesite. - Ok para [employer] un mximo de 2 inhalaciones cada 4 horas si se debe llamar para una cita inmediata, pero si el uso sube por encima de su necesidad habitual - No salga de casa sin ella !! (Piense en ello como el neumtico de repuesto para su coche) Para la tos o el dolor tramadol tardar hasta 50 1/2 cada 4 horas segn sea necesario Ver Funches dentro de 2 semanas y le voy a enviar todos mis pensamientos, pero por favor tomar todos sus

## 2014-08-23 ENCOUNTER — Encounter: Payer: Self-pay | Admitting: Internal Medicine

## 2014-08-23 NOTE — Assessment & Plan Note (Signed)
[facility] eval at [facility] 04/01/14 dx prob ild related to connective tissue dz aggravated by obesity (see care everywhere)   The goal with a chronic steroid dependent illness is always arriving at the lowest effective dose that controls the disease/symptoms and not accepting a set "formula" which is based on statistics or guidelines that don't always take into account patient  variability or the natural hx of the dz in every individual patient, which may well vary over time.  For now therefore I recommend the patient maintain  10 mg daily but low threshold to taper this completely off if can master hfa as not clear she has active ILD at this point

## 2014-08-23 NOTE — Assessment & Plan Note (Signed)
-    07/22/2014  try dulera 100 2bid  > improved 08/05/14  -  Sinus CT 08/06/14 > neg -  08/19/14  p extensive coaching HFA effectiveness =    75% > continue dulera 100 2bid   DDX of  difficult airways management all start with A and  include Adherence, Ace Inhibitors, Acid Reflux, Active Sinus Disease, Alpha 1 Antitripsin deficiency, Anxiety masquerading as Airways dz,  ABPA,  allergy(esp in young), Aspiration (esp in elderly), Adverse effects of DPI,  Active smokers, plus two Bs  = Bronchiectasis and Beta blocker use..and one C= CHF  Adherence is always the initial "prime suspect" and is a multilayered concern that requires a "trust but verify" approach in every patient - starting with knowing how to use medications, especially inhalers, correctly, keeping up with refills and understanding the fundamental difference between maintenance and prns vs those medications only taken for a very short course and then stopped and not refilled.  - this is a major, major challenge despite help of interpreter and frankly it is dangerous to keep seeing her in this clinic if she can't process the information given to her orally/ in written spanish/ and visually (I demonstrated which pills were duplicates and she still blank look in terms of understanding why that might be a problem, also demonstrated hfa so all she had do was imitate me, and this was very difficult and not particularly effective   Therefore rec all f/u by spanish speaking doctors > would like her to see Dr Armen Pickup first and Dr Kendrick Fries if additional pulmonary input needed

## 2014-09-10 ENCOUNTER — Other Ambulatory Visit: Payer: Self-pay | Admitting: Family Medicine

## 2014-09-11 ENCOUNTER — Inpatient Hospital Stay (HOSPITAL_COMMUNITY)
Admission: EM | Admit: 2014-09-11 | Discharge: 2014-09-14 | DRG: 189 | Disposition: A | Discharge status: Home / Self Care | Payer: Medicaid Other | Attending: Internal Medicine | Admitting: Internal Medicine

## 2014-09-11 ENCOUNTER — Emergency Department (HOSPITAL_COMMUNITY): Payer: Medicaid Other

## 2014-09-11 ENCOUNTER — Encounter (HOSPITAL_COMMUNITY): Payer: Self-pay | Admitting: Family Medicine

## 2014-09-11 DIAGNOSIS — F419 Anxiety disorder, unspecified: Secondary | ICD-10-CM | POA: Diagnosis present

## 2014-09-11 DIAGNOSIS — R51 Headache: Secondary | ICD-10-CM | POA: Diagnosis present

## 2014-09-11 DIAGNOSIS — M069 Rheumatoid arthritis, unspecified: Secondary | ICD-10-CM | POA: Diagnosis present

## 2014-09-11 DIAGNOSIS — I10 Essential (primary) hypertension: Secondary | ICD-10-CM | POA: Diagnosis present

## 2014-09-11 DIAGNOSIS — IMO0002 Reserved for concepts with insufficient information to code with codable children: Secondary | ICD-10-CM

## 2014-09-11 DIAGNOSIS — Z8701 Personal history of pneumonia (recurrent): Secondary | ICD-10-CM | POA: Diagnosis not present

## 2014-09-11 DIAGNOSIS — F32A Depression, unspecified: Secondary | ICD-10-CM | POA: Diagnosis present

## 2014-09-11 DIAGNOSIS — G8929 Other chronic pain: Secondary | ICD-10-CM | POA: Diagnosis present

## 2014-09-11 DIAGNOSIS — E66813 Obesity, class 3: Secondary | ICD-10-CM

## 2014-09-11 DIAGNOSIS — M329 Systemic lupus erythematosus, unspecified: Secondary | ICD-10-CM | POA: Diagnosis present

## 2014-09-11 DIAGNOSIS — Z6841 Body Mass Index (BMI) 40.0 and over, adult: Secondary | ICD-10-CM | POA: Diagnosis not present

## 2014-09-11 DIAGNOSIS — Z86711 Personal history of pulmonary embolism: Secondary | ICD-10-CM

## 2014-09-11 DIAGNOSIS — Z9981 Dependence on supplemental oxygen: Secondary | ICD-10-CM | POA: Diagnosis not present

## 2014-09-11 DIAGNOSIS — Z7952 Long term (current) use of systemic steroids: Secondary | ICD-10-CM

## 2014-09-11 DIAGNOSIS — J9601 Acute respiratory failure with hypoxia: Secondary | ICD-10-CM | POA: Diagnosis present

## 2014-09-11 DIAGNOSIS — J9620 Acute and chronic respiratory failure, unspecified whether with hypoxia or hypercapnia: Secondary | ICD-10-CM | POA: Diagnosis present

## 2014-09-11 DIAGNOSIS — L409 Psoriasis, unspecified: Secondary | ICD-10-CM | POA: Diagnosis present

## 2014-09-11 DIAGNOSIS — R059 Cough, unspecified: Secondary | ICD-10-CM

## 2014-09-11 DIAGNOSIS — J209 Acute bronchitis, unspecified: Secondary | ICD-10-CM | POA: Diagnosis present

## 2014-09-11 DIAGNOSIS — F418 Other specified anxiety disorders: Secondary | ICD-10-CM

## 2014-09-11 DIAGNOSIS — J9801 Acute bronchospasm: Secondary | ICD-10-CM

## 2014-09-11 DIAGNOSIS — F329 Major depressive disorder, single episode, unspecified: Secondary | ICD-10-CM | POA: Diagnosis present

## 2014-09-11 DIAGNOSIS — R0600 Dyspnea, unspecified: Secondary | ICD-10-CM

## 2014-09-11 DIAGNOSIS — J189 Pneumonia, unspecified organism: Secondary | ICD-10-CM | POA: Diagnosis present

## 2014-09-11 DIAGNOSIS — J9621 Acute and chronic respiratory failure with hypoxia: Secondary | ICD-10-CM | POA: Diagnosis present

## 2014-09-11 DIAGNOSIS — J45901 Unspecified asthma with (acute) exacerbation: Secondary | ICD-10-CM | POA: Diagnosis present

## 2014-09-11 DIAGNOSIS — I2699 Other pulmonary embolism without acute cor pulmonale: Secondary | ICD-10-CM | POA: Diagnosis present

## 2014-09-11 DIAGNOSIS — J8410 Pulmonary fibrosis, unspecified: Secondary | ICD-10-CM | POA: Diagnosis present

## 2014-09-11 DIAGNOSIS — R05 Cough: Secondary | ICD-10-CM

## 2014-09-11 DIAGNOSIS — R0602 Shortness of breath: Secondary | ICD-10-CM | POA: Diagnosis present

## 2014-09-11 HISTORY — DX: Unspecified asthma, uncomplicated: J45.909

## 2014-09-11 HISTORY — DX: Depression, unspecified: F32.A

## 2014-09-11 HISTORY — DX: Major depressive disorder, single episode, unspecified: F32.9

## 2014-09-11 LAB — CBC WITH DIFFERENTIAL/PLATELET
BASOS ABS: 0 10*3/uL (ref 0.0–0.1)
Basophils Relative: 0 % (ref 0–1)
EOS PCT: 1 % (ref 0–5)
Eosinophils Absolute: 0.1 10*3/uL (ref 0.0–0.7)
HCT: 49.7 % — ABNORMAL HIGH (ref 36.0–46.0)
Hemoglobin: 15.8 g/dL — ABNORMAL HIGH (ref 12.0–15.0)
LYMPHS PCT: 29 % (ref 12–46)
Lymphs Abs: 2.2 10*3/uL (ref 0.7–4.0)
MCH: 30 pg (ref 26.0–34.0)
MCHC: 31.8 g/dL (ref 30.0–36.0)
MCV: 94.5 fL (ref 78.0–100.0)
Monocytes Absolute: 0.4 10*3/uL (ref 0.1–1.0)
Monocytes Relative: 5 % (ref 3–12)
NEUTROS ABS: 4.9 10*3/uL (ref 1.7–7.7)
Neutrophils Relative %: 65 % (ref 43–77)
PLATELETS: 258 10*3/uL (ref 150–400)
RBC: 5.26 MIL/uL — ABNORMAL HIGH (ref 3.87–5.11)
RDW: 13.4 % (ref 11.5–15.5)
WBC: 7.5 10*3/uL (ref 4.0–10.5)

## 2014-09-11 LAB — I-STAT ARTERIAL BLOOD GAS, ED
Acid-Base Excess: 2 mmol/L (ref 0.0–2.0)
Bicarbonate: 29.1 mEq/L — ABNORMAL HIGH (ref 20.0–24.0)
O2 Saturation: 95 %
PH ART: 7.331 — AB (ref 7.350–7.450)
TCO2: 31 mmol/L (ref 0–100)
pCO2 arterial: 55 mmHg — ABNORMAL HIGH (ref 35.0–45.0)
pO2, Arterial: 81 mmHg (ref 80.0–100.0)

## 2014-09-11 LAB — URINALYSIS, ROUTINE W REFLEX MICROSCOPIC
Glucose, UA: NEGATIVE mg/dL
Ketones, ur: 15 mg/dL — AB
LEUKOCYTES UA: NEGATIVE
Nitrite: NEGATIVE
PROTEIN: NEGATIVE mg/dL
Specific Gravity, Urine: 1.026 (ref 1.005–1.030)
Urobilinogen, UA: 0.2 mg/dL (ref 0.0–1.0)
pH: 5.5 (ref 5.0–8.0)

## 2014-09-11 LAB — COMPREHENSIVE METABOLIC PANEL
ALK PHOS: 66 U/L (ref 39–117)
ALT: 23 U/L (ref 0–35)
ANION GAP: 7 (ref 5–15)
AST: 36 U/L (ref 0–37)
Albumin: 3.6 g/dL (ref 3.5–5.2)
BUN: 5 mg/dL — AB (ref 6–23)
CO2: 31 mmol/L (ref 19–32)
Calcium: 8.7 mg/dL (ref 8.4–10.5)
Chloride: 103 mmol/L (ref 96–112)
Creatinine, Ser: 0.66 mg/dL (ref 0.50–1.10)
GFR calc Af Amer: >90 mL/min (ref >90)
Glucose, Bld: 93 mg/dL (ref 70–99)
POTASSIUM: 3.8 mmol/L (ref 3.5–5.1)
Sodium: 141 mmol/L (ref 135–145)
TOTAL PROTEIN: 6.8 g/dL (ref 6.0–8.3)
Total Bilirubin: 1 mg/dL (ref 0.3–1.2)

## 2014-09-11 LAB — MRSA PCR SCREENING: MRSA by PCR: NEGATIVE

## 2014-09-11 LAB — I-STAT CG4 LACTIC ACID, ED: Lactic Acid, Venous: 1.72 mmol/L (ref 0.5–2.0)

## 2014-09-11 LAB — URINE MICROSCOPIC-ADD ON

## 2014-09-11 LAB — TROPONIN I

## 2014-09-11 LAB — I-STAT BETA HCG BLOOD, ED (MC, WL, AP ONLY): I-stat hCG, quantitative: <5 m[IU]/mL (ref <5)

## 2014-09-11 LAB — BRAIN NATRIURETIC PEPTIDE: B Natriuretic Peptide: 12 pg/mL (ref 0.0–100.0)

## 2014-09-11 MED ORDER — CEFTRIAXONE SODIUM IN DEXTROSE 20 MG/ML IV SOLN
1.0000 g | INTRAVENOUS | Status: DC
  Administered 2014-09-11 – 2014-09-13 (×3): 1 g via INTRAVENOUS
  Filled 2014-09-11 (×4): qty 50

## 2014-09-11 MED ORDER — FENTANYL CITRATE 0.05 MG/ML IJ SOLN
75.0000 ug | Freq: Once | INTRAMUSCULAR | Status: AC
  Administered 2014-09-11: 75 ug via INTRAVENOUS
  Filled 2014-09-11: qty 2

## 2014-09-11 MED ORDER — DEXTROSE 5 % IV SOLN
500.0000 mg | Freq: Once | INTRAVENOUS | Status: AC
  Administered 2014-09-11: 500 mg via INTRAVENOUS
  Filled 2014-09-11: qty 500

## 2014-09-11 MED ORDER — ALBUTEROL (5 MG/ML) CONTINUOUS INHALATION SOLN
10.0000 mg/h | INHALATION_SOLUTION | Freq: Once | RESPIRATORY_TRACT | Status: AC
  Administered 2014-09-11: 10 mg/h via RESPIRATORY_TRACT
  Filled 2014-09-11: qty 20

## 2014-09-11 MED ORDER — MOMETASONE FURO-FORMOTEROL FUM 100-5 MCG/ACT IN AERO
2.0000 | INHALATION_SPRAY | Freq: Two times a day (BID) | RESPIRATORY_TRACT | Status: DC
  Administered 2014-09-12 – 2014-09-14 (×5): 2 via RESPIRATORY_TRACT
  Filled 2014-09-11: qty 8.8

## 2014-09-11 MED ORDER — GUAIFENESIN-DM 100-10 MG/5ML PO SYRP
5.0000 mL | ORAL_SOLUTION | ORAL | Status: DC | PRN
  Filled 2014-09-11: qty 5

## 2014-09-11 MED ORDER — ALUM & MAG HYDROXIDE-SIMETH 200-200-20 MG/5ML PO SUSP: 30.0000 mL | Freq: Four times a day (QID) | ORAL | Status: DC | PRN

## 2014-09-11 MED ORDER — SODIUM CHLORIDE 0.9 % IV SOLN
INTRAVENOUS | Status: DC
  Administered 2014-09-11: 16:00:00 via INTRAVENOUS

## 2014-09-11 MED ORDER — AZITHROMYCIN 500 MG IV SOLR
500.0000 mg | INTRAVENOUS | Status: DC
  Administered 2014-09-12 – 2014-09-13 (×2): 500 mg via INTRAVENOUS
  Filled 2014-09-11 (×3): qty 500

## 2014-09-11 MED ORDER — METHYLPREDNISOLONE SODIUM SUCC 125 MG IJ SOLR
125.0000 mg | Freq: Once | INTRAMUSCULAR | Status: AC
  Administered 2014-09-11: 125 mg via INTRAVENOUS
  Filled 2014-09-11: qty 2

## 2014-09-11 MED ORDER — SODIUM CHLORIDE 0.9 % IV SOLN
INTRAVENOUS | Status: AC
  Administered 2014-09-12: 04:00:00 via INTRAVENOUS

## 2014-09-11 MED ORDER — DEXTROSE 5 % IV SOLN: 1.0000 g | INTRAVENOUS | Status: DC

## 2014-09-11 MED ORDER — HYDROCODONE-ACETAMINOPHEN 5-325 MG PO TABS
1.0000 | ORAL_TABLET | ORAL | Status: DC | PRN
  Administered 2014-09-11 – 2014-09-12 (×3): 2 via ORAL
  Filled 2014-09-11 (×3): qty 2

## 2014-09-11 MED ORDER — SODIUM CHLORIDE 0.9 % IJ SOLN
3.0000 mL | Freq: Two times a day (BID) | INTRAMUSCULAR | Status: DC
  Administered 2014-09-11 – 2014-09-14 (×5): 3 mL via INTRAVENOUS

## 2014-09-11 MED ORDER — HYDROXYZINE HCL 10 MG PO TABS
10.0000 mg | ORAL_TABLET | Freq: Three times a day (TID) | ORAL | Status: DC | PRN
  Administered 2014-09-13: 10 mg via ORAL
  Filled 2014-09-11 (×2): qty 1

## 2014-09-11 MED ORDER — SODIUM CHLORIDE 0.9 % IV BOLUS (SEPSIS)
500.0000 mL | Freq: Once | INTRAVENOUS | Status: AC
  Administered 2014-09-11: 500 mL via INTRAVENOUS

## 2014-09-11 MED ORDER — OSELTAMIVIR PHOSPHATE 75 MG PO CAPS
75.0000 mg | ORAL_CAPSULE | Freq: Two times a day (BID) | ORAL | Status: DC
  Administered 2014-09-11 – 2014-09-13 (×4): 75 mg via ORAL
  Filled 2014-09-11 (×7): qty 1

## 2014-09-11 MED ORDER — METHYLPREDNISOLONE SODIUM SUCC 125 MG IJ SOLR
80.0000 mg | Freq: Three times a day (TID) | INTRAMUSCULAR | Status: DC
  Administered 2014-09-11 – 2014-09-13 (×6): 80 mg via INTRAVENOUS
  Filled 2014-09-11 (×2): qty 1.28
  Filled 2014-09-11 (×3): qty 2
  Filled 2014-09-11 (×4): qty 1.28

## 2014-09-11 MED ORDER — RIVAROXABAN 20 MG PO TABS
20.0000 mg | ORAL_TABLET | Freq: Every day | ORAL | Status: DC
  Administered 2014-09-12 – 2014-09-13 (×2): 20 mg via ORAL
  Filled 2014-09-11 (×4): qty 1

## 2014-09-11 MED ORDER — DEXTROSE 5 % IV SOLN: 1.0000 g | Freq: Once | INTRAVENOUS | Status: DC

## 2014-09-11 MED ORDER — DEXTROSE 5 % IV SOLN: 500.0000 mg | INTRAVENOUS | Status: DC

## 2014-09-11 MED ORDER — ONDANSETRON HCL 4 MG/2ML IJ SOLN
4.0000 mg | Freq: Four times a day (QID) | INTRAMUSCULAR | Status: DC | PRN
Start: 2014-09-11 — End: 2014-09-14

## 2014-09-11 MED ORDER — POLYETHYLENE GLYCOL 3350 17 G PO PACK
17.0000 g | PACK | Freq: Every day | ORAL | Status: DC | PRN
  Filled 2014-09-11: qty 1

## 2014-09-11 MED ORDER — ONDANSETRON HCL 4 MG PO TABS: 4.0000 mg | ORAL_TABLET | Freq: Four times a day (QID) | ORAL | Status: DC | PRN

## 2014-09-11 NOTE — ED Notes (Signed)
Pt here for headache, cough, N,V x 1 week. sts also fever.

## 2014-09-11 NOTE — ED Notes (Signed)
Respiratory aware of need for continuous neb 

## 2014-09-11 NOTE — ED Notes (Signed)
Respiratory at bedside to collect ABG

## 2014-09-11 NOTE — H&P (Signed)
Patient Demographics  Donna Hall, is a 29 y.o. female  MRN: [id number]   DOB - Apr 28, 1986  Admit Date - 09/11/2014  Outpatient Primary MD for the patient is Lora Paula, MD   With History of -  Past Medical History  Diagnosis Date  . Psoriasis 2010    as a child  . Chronic pain disorder   . Obesity   . Chronic steroid use 2010  . Lupus 2000  . Lupus (systemic lupus erythematosus) 2010  . Rheumatoid arthritis(714.0) 2010  . Gestational diabetes 2006  . Hypertension 2010  . Pneumonia "several times"  . Interstitial lung disease     /notes 05/15/2014  . On home oxygen therapy     "2L; 24/7" (05/15/2014)  . Pulmonary embolism     hx/notes 05/15/2014  . History of blood transfusion     "because white cells ate the Hall cells"  . Headache     "just when I have the pain from aching bones and psorasis and/or lupus symptoms" (05/15/2104)  . 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    2008: trivial 09/2007: moderate to large, improved on f/u ECHO       Past Surgical History  Procedure Laterality Date  . Thigh / knee soft tissue biopsy Right 2011    thigh  . Cesarean section  2010    in for   Chief Complaint  Patient presents with  . Cough  . Nausea  . Emesis     HPI  Donna Hall  is a 30 y.o. female, with H/O ILD, Lupus, Psoriasis, Morbid obesity, On Chr Steroids, rheumatoid arthritis, PE on xaralto, who comes into the hospital with a one-week history of cough, headache and body aches, fevers at home, wheezing with shortness of breath, her symptoms did not improve and therefore she came to the ER.   In the ER workup suggestive of acute on chronic respiratory failure, I was called to admit the patient. Patient currently condones to diffuse body aches headaches, productive  cough for the last 2-3 days, gradually progressive shortness of breath and wheezing for the last 2-3 days, denies any chest pain, no abdominal pain, no diarrhea or dysuria. No focal weakness. She speaks minimal English but able to answer questions appropriately.    Review of Systems    In addition to the HPI above,   No Fever-chills, No Headache, No changes with Vision or hearing, No problems swallowing food or Liquids, No Chest pain, ++ Cough & Shortness of Breath, No Abdominal pain, No Nausea or Vommitting, Bowel movements are regular, No Blood in stool or Urine, No dysuria, No new skin rashes or bruises, No new joints pains-aches,  No new weakness, tingling, numbness in any extremity, No recent weight gain or loss, No polyuria, polydypsia or polyphagia, No significant Mental Stressors.  A full 10 point Review of Systems was done, except as stated above, all other Review of  Systems were negative.   Social History History  Substance Use Topics  . Smoking status: Never Smoker   . Smokeless tobacco: Never Used  . Alcohol Use: No      Family History Family History  Problem Relation Age of Onset  . Diabetes Mother   . Hypertension Mother   . Cancer Neg Hx   . Early death Neg Hx   . Heart disease Neg Hx      Prior to Admission medications   Medication Sig Start Date End Date Taking? Authorizing Provider  albuterol (PROVENTIL HFA;VENTOLIN HFA) 108 (90 BASE) MCG/ACT inhaler Inhale 2 puffs into the lungs every 4 (four) hours as needed for wheezing or shortness of breath. 08/05/14   Nyoka Cowden, MD  azaTHIOprine (IMURAN) 50 MG tablet Take 5 tablets (250 mg total) by mouth daily. 06/29/14   Dessa Phi, MD  hydroxychloroquine (PLAQUENIL) 200 MG tablet Take 1 tablet (200 mg total) by mouth 2 (two) times daily. 06/29/14   Dessa Phi, MD  hydrOXYzine (ATARAX/VISTARIL) 10 MG tablet Take 1 tablet (10 mg total) by mouth every 8 (eight) hours as needed for itching. 05/14/14    Josalyn Funches, MD  mometasone-formoterol (DULERA) 100-5 MCG/ACT AERO Take 2 puffs first thing in am and then another 2 puffs about 12 hours later. 07/22/14   Nyoka Cowden, MD  predniSONE (DELTASONE) 10 MG tablet Take 1 tablet (10 mg total) by mouth daily with breakfast. 08/19/14   Nyoka Cowden, MD  rivaroxaban (XARELTO) 20 MG TABS tablet Take 1 tablet (20 mg total) by mouth daily with supper. 04/02/14   Dessa Phi, MD  traMADol (ULTRAM) 50 MG tablet 1-2 every 4 hours as needed for cough or pain 08/19/14   Nyoka Cowden, MD    No Known Allergies  Physical Exam  Vitals  Blood pressure 120/60, pulse 119, temperature 98.2 F (36.8 C), temperature source Oral, resp. rate 24, last menstrual period 09/11/2014, SpO2 100 %.   1. General overtly obese young Hispanic female lying in bed in moderate shortness of breath  2. Normal affect and insight, Not Suicidal or Homicidal, Awake Alert, Oriented X 3.  3. No F.N deficits, ALL C.Nerves Intact, Strength 5/5 all 4 extremities, Sensation intact all 4 extremities, Plantars down going.  4. Ears and Eyes appear Normal, Conjunctivae clear, PERRLA. Moist Oral Mucosa.  5. Supple Neck, No JVD, No cervical lymphadenopathy appriciated, No Carotid Bruits.  6. Symmetrical Chest wall movement, Mod air movement bilaterally, ++ wheezing  7. Rapid RRR, No Gallops, Rubs or Murmurs, No Parasternal Heave.  8. Positive Bowel Sounds, Abdomen Soft, No tenderness, No organomegaly appriciated,No rebound -guarding or rigidity.  9.  No Cyanosis, Normal Skin Turgor, No Skin Rash or Bruise.  10. Good muscle tone,  joints appear normal , no effusions, Normal ROM.  11. No Palpable Lymph Nodes in Neck or Axillae     Data Review  CBC  Recent Labs Lab 09/11/14 1130  WBC 7.5  HGB 15.8*  HCT 49.7*  PLT 258  MCV 94.5  MCH 30.0  MCHC 31.8  RDW 13.4  LYMPHSABS 2.2  MONOABS 0.4  EOSABS 0.1  BASOSABS 0.0    ------------------------------------------------------------------------------------------------------------------  Chemistries   Recent Labs Lab 09/11/14 1130  NA 141  K 3.8  CL 103  CO2 31  GLUCOSE 93  BUN 5*  CREATININE 0.66  CALCIUM 8.7  AST 36  ALT 23  ALKPHOS 66  BILITOT 1.0   ------------------------------------------------------------------------------------------------------------------ CrCl cannot be calculated (  Unknown ideal weight.). ------------------------------------------------------------------------------------------------------------------ No results for input(s): TSH, T4TOTAL, T3FREE, THYROIDAB in the last 72 hours.  Invalid input(s): FREET3   Coagulation profile No results for input(s): INR, PROTIME in the last 168 hours. ------------------------------------------------------------------------------------------------------------------- No results for input(s): DDIMER in the last 72 hours. -------------------------------------------------------------------------------------------------------------------  Cardiac Enzymes No results for input(s): CKMB, TROPONINI, MYOGLOBIN in the last 168 hours.  Invalid input(s): CK ------------------------------------------------------------------------------------------------------------------ Invalid input(s): POCBNP   ---------------------------------------------------------------------------------------------------------------  Urinalysis    Component Value Date/Time   COLORURINE AMBER* 06/07/2014 2017   APPEARANCEUR CLEAR 06/07/2014 2017   LABSPEC 1.028 06/07/2014 2017   PHURINE 5.5 06/07/2014 2017   GLUCOSEU NEGATIVE 06/07/2014 2017   GLUCOSEU NEG mg/dL 24/26/8341 9622   HGBUR NEGATIVE 06/07/2014 2017   BILIRUBINUR neg 06/29/2014 1806   BILIRUBINUR SMALL* 06/07/2014 2017   KETONESUR 15* 06/07/2014 2017   PROTEINUR trace 06/29/2014 1806   PROTEINUR NEGATIVE 06/07/2014 2017   UROBILINOGEN 0.2  06/29/2014 1806   UROBILINOGEN 1.0 06/07/2014 2017   NITRITE neg 06/29/2014 1806   NITRITE NEGATIVE 06/07/2014 2017   LEUKOCYTESUR Negative 06/29/2014 1806    ----------------------------------------------------------------------------------------------------------------  Imaging results:   Dg Chest 2 View  09/11/2014   CLINICAL DATA:  Cough and headache.  Fever.  EXAM: CHEST  2 VIEW  COMPARISON:  07/28/2014  FINDINGS: Stable borderline cardiomegaly, accentuated by low volumes. Negative aortic and hilar contours. The main pulmonary artery is prominent, similar to previous. There has been recent echocardiography (January 2016).  Stable patchy subtle lung opacities, especially in the left upper lung. These correlates with ground-glass densities on preceding CTs, possibly an inflammatory pneumonia. No evidence of superimposed pneumonia or edema. No effusion or pneumothorax.  IMPRESSION: Chronic lung disease and hypoventilation. No acute superimposed findings.   Electronically Signed   By: Marnee Spring M.D.   On: 09/11/2014 12:15    My personal review of EKG: Rhythm S.Tach, Rate  101 /min,  no Acute ST changes    Assessment & Plan   1. Acute on chronic respiratory failure due to flu versus early community-acquired pneumonia in a patient with history of interstitial lung disease, lupus on chronic prednisone. Will be admitted to stepdown, she is significantly short of breath and has increased work of breathing, we will obtain ABG, for now oxygen supplementation along with nebulizer treatments, check influenza swab, sputum Gram stain and culture along with blood cultures, we will place her on Tamiflu along with Rocephin and azithromycin. Check HIV, legionella and strep pneumo antigen, since she has significant wheezing and already on prednisone will put her on IV Solu-Medrol. BIPAP PRN,  Monitor closely.    2. History of PE. Continue xaralto.    3. History of lupus, interstitial lung  disease, rheumatoid arthritis. For now IV steroids, hold other immunosuppressive medications while acute infectious phase continues. Thereafter follow with her primary pulmonologist and rheumatologist.    Will check a pregnancy test.     DVT Prophylaxis Xaralto  AM Labs Ordered, also please review Full Orders  Family Communication: Admission, patients condition and plan of care including tests being ordered have been discussed with the patient   who indicates understanding and agree with the plan and Code Status.  Code Status Full  Likely DC to  Home  Condition GUARDED     Time spent in minutes : 35    Rahkeem Senft K M.D on 09/11/2014 at 4:11 PM  Between 7am to 7pm - Pager - [phone]  After 7pm go to [email or web address] - password [facility]  [facility]  Office  [phone]

## 2014-09-11 NOTE — Progress Notes (Signed)
ANTICOAGULATION CONSULT NOTE - Initial Consult  Pharmacy Consult for Xarelto Indication: pulmonary embolus  No Known Allergies  Patient Measurements:   Heparin Dosing Weight:   Vital Signs: Temp: 98.2 F (36.8 C) (03/18 1354) Temp Source: Oral (03/18 1354) BP: 120/60 mmHg (03/18 1515) Pulse Rate: 128 (03/18 1615)  Labs:  Recent Labs  09/11/14 1130  HGB 15.8*  HCT 49.7*  PLT 258  CREATININE 0.66    CrCl cannot be calculated (Unknown ideal weight.).   Medical History: Past Medical History  Diagnosis Date  . Psoriasis 2010    as a child  . Chronic pain disorder   . Obesity   . Chronic steroid use 2010  . Lupus 2000  . Lupus (systemic lupus erythematosus) 2010  . Rheumatoid arthritis(714.0) 2010  . Gestational diabetes 2006  . Hypertension 2010  . Pneumonia "several times"  . Interstitial lung disease     /notes 05/15/2014  . On home oxygen therapy     "2L; 24/7" (05/15/2014)  . Pulmonary embolism     hx/notes 05/15/2014  . History of blood transfusion     "because white cells ate the red cells"  . Headache     "just when I have the pain from aching bones and psorasis and/or lupus symptoms" (05/15/2104)  . 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    2008: trivial 09/2007: moderate to large, improved on f/u ECHO     Medications:   (Not in a hospital admission) Scheduled:  . methylPREDNISolone (SOLU-MEDROL) injection  80 mg Intravenous 3 times per day  . mometasone-formoterol  2 puff Inhalation BID  . oseltamivir  75 mg Oral BID  . sodium chloride  3 mL Intravenous Q12H   Infusions:  . sodium chloride 125 mL/hr at 09/11/14 1618  . sodium chloride    . azithromycin    . cefTRIAXone (ROCEPHIN)  IV    . cefTRIAXone (ROCEPHIN)  IV      Assessment: 28yo female with history of lupus, RA, HTN and previous PE in 04/2014 presents with cough, HA and body aches, fever, wheezing, and SOB. Pharmacy is consulted to dose xarelto for PE. Hgb 15.8, Plt 258, sCr  0.7.  Pt was on xarelto 20mg  PO daily with supper.   Goal of Therapy:  Monitor platelets by anticoagulation protocol: Yes   Plan:  Xarelto 20mg  PO daily with supper Continue to monitor H&H and platelets  Monitor s/sx of bleeding  . , PharmD Clinical Pharmacist Pager [phone] 09/11/2014,5:57 PM

## 2014-09-11 NOTE — ED Provider Notes (Addendum)
CSN: [id number]     Arrival date & time 09/11/14  1102 History   First MD Initiated Contact with Patient 09/11/14 1353     Chief Complaint  Patient presents with  . Cough  . Nausea  . Emesis     (Consider location/radiation/quality/duration/timing/severity/associated sxs/prior Treatment) HPI Comments: 29 year-old female with interstitial lung disease, rheumatoid arthritis, psoriasis, chronic steroid use, obesity, lupus, pulmonary wasn't on blood thinners, high blood pressure presents with worsening cough vomiting and subjective fever the past week. Patient said mild chest discomfort only with coughing, no cardiac history. Patient has gradual onset headache. No congestive heart failure history no unilateral leg symptoms. Translator use. Symptoms intermittent.  Patient is a 29 y.o. female presenting with cough and vomiting. The history is provided by the patient.  Cough Associated symptoms: chest pain, fever and headaches   Associated symptoms: no chills, no rash and no shortness of breath   Emesis Associated symptoms: headaches   Associated symptoms: no abdominal pain and no chills     Past Medical History  Diagnosis Date  . Psoriasis 2010    as a child  . Chronic pain disorder   . Obesity   . Chronic steroid use 2010  . Lupus 2000  . Lupus (systemic lupus erythematosus) 2010  . Rheumatoid arthritis(714.0) 2010  . Gestational diabetes 2006  . Hypertension 2010  . Pneumonia "several times"  . Interstitial lung disease     /notes 05/15/2014  . On home oxygen therapy     "2L; 24/7" (05/15/2014)  . Pulmonary embolism     hx/notes 05/15/2014  . History of blood transfusion     "because white cells ate the red cells"  . Headache     "just when I have the pain from aching bones and psorasis and/or lupus symptoms" (05/15/2104)  . 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    2008: trivial 09/2007: moderate to large, improved on f/u ECHO    Past Surgical History  Procedure Laterality  Date  . Thigh / knee soft tissue biopsy Right 2011    thigh  . Cesarean section  2010   Family History  Problem Relation Age of Onset  . Diabetes Mother   . Hypertension Mother   . Cancer Neg Hx   . Early death Neg Hx   . Heart disease Neg Hx    History  Substance Use Topics  . Smoking status: Never Smoker   . Smokeless tobacco: Never Used  . Alcohol Use: No   OB History    No data available     Review of Systems  Constitutional: Positive for fever. Negative for chills.  HENT: Negative for congestion.   Eyes: Negative for visual disturbance.  Respiratory: Positive for cough. Negative for shortness of breath.   Cardiovascular: Positive for chest pain. Negative for leg swelling.  Gastrointestinal: Positive for nausea and vomiting. Negative for abdominal pain.  Genitourinary: Negative for dysuria and flank pain.  Musculoskeletal: Negative for back pain, neck pain and neck stiffness.  Skin: Negative for rash.  Neurological: Positive for headaches. Negative for light-headedness.      Allergies  Review of patient's allergies indicates no known allergies.  Home Medications   Prior to Admission medications   Medication Sig Start Date End Date Taking? Authorizing Provider  albuterol (PROVENTIL HFA;VENTOLIN HFA) 108 (90 BASE) MCG/ACT inhaler Inhale 2 puffs into the lungs every 4 (four) hours as needed for wheezing or shortness of breath. 08/05/14   Nyoka Cowden, MD  azaTHIOprine (  IMURAN) 50 MG tablet Take 5 tablets (250 mg total) by mouth daily. 06/29/14   Dessa Phi, MD  hydroxychloroquine (PLAQUENIL) 200 MG tablet Take 1 tablet (200 mg total) by mouth 2 (two) times daily. 06/29/14   Dessa Phi, MD  hydrOXYzine (ATARAX/VISTARIL) 10 MG tablet Take 1 tablet (10 mg total) by mouth every 8 (eight) hours as needed for itching. 05/14/14   Josalyn Funches, MD  mometasone-formoterol (DULERA) 100-5 MCG/ACT AERO Take 2 puffs first thing in am and then another 2 puffs about 12  hours later. 07/22/14   Nyoka Cowden, MD  predniSONE (DELTASONE) 10 MG tablet Take 1 tablet (10 mg total) by mouth daily with breakfast. 08/19/14   Nyoka Cowden, MD  rivaroxaban (XARELTO) 20 MG TABS tablet Take 1 tablet (20 mg total) by mouth daily with supper. 04/02/14   Dessa Phi, MD  traMADol (ULTRAM) 50 MG tablet 1-2 every 4 hours as needed for cough or pain 08/19/14   Nyoka Cowden, MD   BP 120/60 mmHg  Pulse 119  Temp(Src) 98.2 F (36.8 C) (Oral)  Resp 24  SpO2 100%  LMP 09/11/2014 Physical Exam  Constitutional: She is oriented to person, place, and time. She appears well-developed and well-nourished.  HENT:  Head: Normocephalic and atraumatic.  Mild dry mm  Eyes: Conjunctivae are normal. Right eye exhibits no discharge. Left eye exhibits no discharge.  Neck: Normal range of motion. Neck supple. No tracheal deviation present.  Cardiovascular: Regular rhythm.  Tachycardia present.   Pulmonary/Chest: She has wheezes (mild tachypnea).  Abdominal: Soft. She exhibits no distension. There is no tenderness. There is no guarding.  Musculoskeletal: She exhibits no edema.  Neurological: She is alert and oriented to person, place, and time.  Skin: Skin is warm. No rash noted.  Psychiatric: She has a normal mood and affect.  Nursing note and vitals reviewed.   ED Course  Procedures (including critical care time) Emergency Ultrasound Study:   Angiocath insertion Performed by: Enid Skeens  Consent: Verbal consent obtained. Risks and benefits: risks, benefits and alternatives were discussed Immediately prior to procedure the correct patient, procedure, equipment, support staff and site/side marked as needed.  Indication: difficult IV access Preparation: Patient was prepped and draped in the usual sterile fashion. Vein Location: right ac vein was visualized during assessment for potential access sites and was found to be patent/ easily compressed with linear ultrasound.   The needle was visualized with real-time ultrasound and guided into the vein. Gauge: 18 g  Image saved and stored.  Normal blood return.  Patient tolerance: Patient tolerated the procedure well with no immediate complications.     Labs Review Labs Reviewed  CBC WITH DIFFERENTIAL/PLATELET - Abnormal; Notable for the following:    RBC 5.26 (*)    Hemoglobin 15.8 (*)    HCT 49.7 (*)    All other components within normal limits  COMPREHENSIVE METABOLIC PANEL - Abnormal; Notable for the following:    BUN 5 (*)    All other components within normal limits  URINE CULTURE  CULTURE, EXPECTORATED SPUTUM-ASSESSMENT  CULTURE, BLOOD (ROUTINE X 2)  CULTURE, BLOOD (ROUTINE X 2)  BRAIN NATRIURETIC PEPTIDE  TROPONIN I  BLOOD GAS, ARTERIAL  INFLUENZA PANEL BY PCR (TYPE A & B, H1N1)  URINALYSIS, ROUTINE W REFLEX MICROSCOPIC  PREGNANCY, URINE  HIV ANTIBODY (ROUTINE TESTING)  LEGIONELLA ANTIGEN, URINE  STREP PNEUMONIAE URINARY ANTIGEN  POC URINE PREG, ED    Imaging Review Dg Chest 2 View  09/11/2014   CLINICAL DATA:  Cough and headache.  Fever.  EXAM: CHEST  2 VIEW  COMPARISON:  07/28/2014  FINDINGS: Stable borderline cardiomegaly, accentuated by low volumes. Negative aortic and hilar contours. The main pulmonary artery is prominent, similar to previous. There has been recent echocardiography (January 2016).  Stable patchy subtle lung opacities, especially in the left upper lung. These correlates with ground-glass densities on preceding CTs, possibly an inflammatory pneumonia. No evidence of superimposed pneumonia or edema. No effusion or pneumothorax.  IMPRESSION: Chronic lung disease and hypoventilation. No acute superimposed findings.   Electronically Signed   By: Marnee Spring M.D.   On: 09/11/2014 12:15     EKG Interpretation   Date/Time:  Friday September 11 2014 14:05:02 EDT Ventricular Rate:  101 PR Interval:  127 QRS Duration: 85 QT Interval:  365 QTC Calculation: 473 R Axis:    60 Text Interpretation:  Age not entered, assumed to be  29 years old for  purpose of ECG interpretation Sinus tachycardia Baseline wander in lead(s)  V3 Confirmed by Ashten Sarnowski  MD, Shelvy Heckert ([id number]) on 09/11/2014 3:39:50 PM      MDM   Final diagnoses:  Acute dyspnea  Bronchospasm  Systemic lupus erythematosus  Obesity, Class III, BMI 40-49.9 (morbid obesity)   Patient presents with cough subjective fever and significant wheezing. Clinical concern for interstitial lung disease/bronchospasm component. Patient has blood clot history however is on Xarelto in taking the medicine.  With clinical exam consistent with other pathology and patient already on treatment I do not feel CT scan of the chest is indicated this time unless patient is not improved in the hospital with nebulizers and steroids. Patient is not confused, no concern for hypercapnia this time. Normal oxygen saturations range 91 to 95% Antibiotics for possible atypical pneumonia.  Hospitalist requesting influenza.   Difficult IV, bedside ultrasound performed.  The patients results and plan were reviewed and discussed.   Any x-rays performed were personally reviewed by myself.   Differential diagnosis were considered with the presenting HPI.  Medications  sodium chloride 0.9 % bolus 500 mL (not administered)  azithromycin (ZITHROMAX) 500 mg in dextrose 5 % 250 mL IVPB (not administered)  oseltamivir (TAMIFLU) capsule 75 mg (not administered)  0.9 %  sodium chloride infusion (not administered)  cefTRIAXone (ROCEPHIN) 1 g in dextrose 5 % 50 mL IVPB (not administered)  cefTRIAXone (ROCEPHIN) 1 g in dextrose 5 % 50 mL IVPB (not administered)  azithromycin (ZITHROMAX) 500 mg in dextrose 5 % 250 mL IVPB (not administered)  albuterol (PROVENTIL,VENTOLIN) solution continuous neb (10 mg/hr Nebulization Given 09/11/14 1450)  methylPREDNISolone sodium succinate (SOLU-MEDROL) 125 mg/2 mL injection 125 mg (125 mg Intravenous Given 09/11/14 1607)   fentaNYL (SUBLIMAZE) injection 75 mcg (75 mcg Intravenous Given 09/11/14 1608)    Filed Vitals:   09/11/14 1445 09/11/14 1452 09/11/14 1500 09/11/14 1515  BP: 111/77  129/82 120/60  Pulse: 106  104 119  Temp:      TempSrc:      Resp: 22  26 24   SpO2: 92% 93% 100% 100%    Final diagnoses:  Acute dyspnea  Bronchospasm  Systemic lupus erythematosus  Obesity, Class III, BMI 40-49.9 (morbid obesity)    Admission/ observation were discussed with the admitting physician, patient and/or family and they are comfortable with the plan.     Blane Ohara, MD 09/11/14 1605  Blane Ohara, MD 09/11/14 [phone]

## 2014-09-11 NOTE — ED Notes (Signed)
Attempted report 

## 2014-09-12 LAB — CBC
HCT: 46.3 % — ABNORMAL HIGH (ref 36.0–46.0)
Hemoglobin: 14.6 g/dL (ref 12.0–15.0)
MCH: 29.7 pg (ref 26.0–34.0)
MCHC: 31.5 g/dL (ref 30.0–36.0)
MCV: 94.3 fL (ref 78.0–100.0)
PLATELETS: 245 10*3/uL (ref 150–400)
RBC: 4.91 MIL/uL (ref 3.87–5.11)
RDW: 13.3 % (ref 11.5–15.5)
WBC: 5.7 10*3/uL (ref 4.0–10.5)

## 2014-09-12 LAB — STREP PNEUMONIAE URINARY ANTIGEN: Strep Pneumo Urinary Antigen: NEGATIVE

## 2014-09-12 LAB — BASIC METABOLIC PANEL
Anion gap: 9 (ref 5–15)
BUN: 8 mg/dL (ref 6–23)
CO2: 25 mmol/L (ref 19–32)
Calcium: 8.6 mg/dL (ref 8.4–10.5)
Chloride: 105 mmol/L (ref 96–112)
Creatinine, Ser: 0.39 mg/dL — ABNORMAL LOW (ref 0.50–1.10)
GFR calc Af Amer: >90 mL/min (ref >90)
Glucose, Bld: 132 mg/dL — ABNORMAL HIGH (ref 70–99)
Potassium: 4.5 mmol/L (ref 3.5–5.1)
Sodium: 139 mmol/L (ref 135–145)

## 2014-09-12 LAB — INFLUENZA PANEL BY PCR (TYPE A & B)
H1N1 flu by pcr: NOT DETECTED
INFLAPCR: NEGATIVE
Influenza B By PCR: NEGATIVE

## 2014-09-12 LAB — HIV ANTIBODY (ROUTINE TESTING W REFLEX): HIV Screen 4th Generation wRfx: NONREACTIVE

## 2014-09-12 MED ORDER — ZOLPIDEM TARTRATE 5 MG PO TABS
5.0000 mg | ORAL_TABLET | Freq: Once | ORAL | Status: AC
  Administered 2014-09-13: 5 mg via ORAL
  Filled 2014-09-12: qty 1

## 2014-09-12 MED ORDER — MORPHINE SULFATE 2 MG/ML IJ SOLN
2.0000 mg | Freq: Once | INTRAMUSCULAR | Status: AC
  Administered 2014-09-12: 2 mg via INTRAVENOUS
  Filled 2014-09-12: qty 1

## 2014-09-12 MED ORDER — ACETAMINOPHEN 325 MG PO TABS
650.0000 mg | ORAL_TABLET | Freq: Four times a day (QID) | ORAL | Status: DC | PRN
  Administered 2014-09-12: 650 mg via ORAL
  Filled 2014-09-12: qty 2

## 2014-09-12 MED ORDER — MORPHINE SULFATE 2 MG/ML IJ SOLN
2.0000 mg | INTRAMUSCULAR | Status: DC | PRN
  Administered 2014-09-12 – 2014-09-13 (×6): 2 mg via INTRAVENOUS
  Filled 2014-09-12 (×7): qty 1

## 2014-09-12 MED ORDER — GUAIFENESIN ER 600 MG PO TB12
1200.0000 mg | ORAL_TABLET | Freq: Two times a day (BID) | ORAL | Status: DC
  Administered 2014-09-12 – 2014-09-14 (×5): 1200 mg via ORAL
  Filled 2014-09-12 (×6): qty 2

## 2014-09-12 MED ORDER — OXYCODONE HCL 5 MG PO TABS
5.0000 mg | ORAL_TABLET | ORAL | Status: DC | PRN
  Administered 2014-09-12 – 2014-09-14 (×8): 5 mg via ORAL
  Filled 2014-09-12 (×8): qty 1

## 2014-09-12 NOTE — Progress Notes (Signed)
Patient Demographics  Donna Hall, is a 29 y.o. female, DOB - September 09, 1985, ZDG:[id number]  Admit date - 09/11/2014   Admitting Physician Leroy Sea, MD  Outpatient Primary MD for the patient is Lora Paula, MD  LOS - 1   Chief Complaint  Patient presents with  . Cough  . Nausea  . Emesis      Admission history of present illness/brief narrative: Donna Hall is a 29 y.o. female, with H/O ILD, Lupus, Psoriasis, Morbid obesity, On Chr Steroids, rheumatoid arthritis, PE on xaralto, who comes into the hospital with a one-week history of cough, headache and body aches, fevers at home, wheezing with shortness of breath, her symptoms did not improve and therefore she came to the ER. In the ER workup suggestive of acute on chronic respiratory failure,Patient currently condones to diffuse body aches headaches, productive cough for the last 2-3 days, gradually progressive shortness of breath and wheezing for the last 2-3 days, denies any chest pain, no abdominal pain, no diarrhea or dysuria. No focal weakness.     Subjective:   Donna Hall today has, No headache, No chest pain, No abdominal pain - No Nausea, reports generalized body ache, cough with productive sputum.  Assessment & Plan    Principal Problem:   Acute respiratory failure with hypoxemia Active Problems:   INTERSTITIAL LUNG DISEASE   Systemic lupus erythematosus   Pulmonary embolism   Anxiety and depression   Acute dyspnea   Acute and chronic respiratory failure (acute-on-chronic)  Acute respiratory failure with hypoxia - With known baseline asthma and interstitial lung disease, seems to be worsened secondary to acute bronchitis versus early . - Still requiring oxygen - Continue with steroids, antibiotic treatment, nebs as needed, will monitor it while at,  Mucinex, for valve.  Acute bronchitis versus early pneumonia - Patient has cough, with significant productive sputum. - Continue with Rocephin and azithromycin. - Negative for flu.  Asthma exacerbation - Continue with IV steroids, nebulizer treatment.  History of PE - Continue with Xarelto  History of lupus, interstitial lung disease, rheumatoid arthritis. - For now IV steroids, hold other immunosuppressive medications while acute infectious phase continues. Thereafter follow with her primary pulmonologist and rheumatologist.     Code Status: Full code  Family Communication: None at bedside  Disposition Plan: Home once stable   Procedures  None   Consults   None   Medications  Scheduled Meds: . azithromycin  500 mg Intravenous Q24H  . cefTRIAXone (ROCEPHIN)  IV  1 g Intravenous Q24H  . methylPREDNISolone (SOLU-MEDROL) injection  80 mg Intravenous 3 times per day  . mometasone-formoterol  2 puff Inhalation BID  . oseltamivir  75 mg Oral BID  . rivaroxaban  20 mg Oral Q supper  . sodium chloride  3 mL Intravenous Q12H   Continuous Infusions: . sodium chloride 75 mL/hr at 09/12/14 0419   PRN Meds:.acetaminophen, alum & mag hydroxide-simeth, guaiFENesin-dextromethorphan, hydrOXYzine, ondansetron **OR** ondansetron (ZOFRAN) IV, oxyCODONE, polyethylene glycol  DVT Prophylaxis  Xarelto  Lab Results  Component Value Date   PLT 245 09/12/2014    Antibiotics    Anti-infectives    Start     Dose/Rate Route Frequency Ordered Stop   09/12/14 1530  azithromycin (ZITHROMAX) 500 mg in dextrose 5 % 250 mL IVPB     500 mg 250 mL/hr over 60 Minutes Intravenous Every 24 hours 09/11/14 1816 09/18/14 1529   09/11/14 2000  cefTRIAXone (ROCEPHIN) 1 g in dextrose 5 % 50 mL IVPB - Premix     1 g 100 mL/hr over 30 Minutes Intravenous Every 24 hours 09/11/14 1929 09/18/14 1959   09/11/14 1615  oseltamivir (TAMIFLU) capsule 75 mg     75 mg Oral 2 times daily 09/11/14 1603  09/16/14 1859   09/11/14 1615  cefTRIAXone (ROCEPHIN) 1 g in dextrose 5 % 50 mL IVPB  Status:  Discontinued     1 g 100 mL/hr over 30 Minutes Intravenous  Once 09/11/14 1606 09/11/14 1922   09/11/14 1615  cefTRIAXone (ROCEPHIN) 1 g in dextrose 5 % 50 mL IVPB  Status:  Discontinued     1 g 100 mL/hr over 30 Minutes Intravenous Every 24 hours 09/11/14 1607 09/11/14 1929   09/11/14 1615  azithromycin (ZITHROMAX) 500 mg in dextrose 5 % 250 mL IVPB  Status:  Discontinued     500 mg 250 mL/hr over 60 Minutes Intravenous Every 24 hours 09/11/14 1607 09/11/14 1816   09/11/14 1545  azithromycin (ZITHROMAX) 500 mg in dextrose 5 % 250 mL IVPB     500 mg 250 mL/hr over 60 Minutes Intravenous  Once 09/11/14 1541 09/11/14 1715          Objective:   Filed Vitals:   09/12/14 0500 09/12/14 0733 09/12/14 0800 09/12/14 0914  BP:  113/73 113/59   Pulse:  81 99   Temp:  97.9 F (36.6 C)    TempSrc:  Oral    Resp:  16 13   Height:      Weight: 142.883 kg (315 lb)     SpO2:  95% 92% 97%    Wt Readings from Last 3 Encounters:  09/12/14 142.883 kg (315 lb)  08/19/14 143.246 kg (315 lb 12.8 oz)  08/05/14 140.615 kg (310 lb)     Intake/Output Summary (Last 24 hours) at 09/12/14 1120 Last data filed at 09/12/14 1000  Gross per 24 hour  Intake    900 ml  Output    975 ml  Net    -75 ml     Physical Exam  Awake Alert, obese female ,Oriented X 3, No new F.N deficits, Normal affect .AT,PERRAL Supple Neck,No JVD, No cervical lymphadenopathy appriciated.  Symmetrical Chest wall movement, Good air movement bilaterally, bilateral wheezing. RRR,No Gallops,Rubs or new Murmurs, No Parasternal Heave +ve B.Sounds, Abd Soft, No tenderness, No organomegaly appriciated, No rebound - guarding or rigidity. No Cyanosis, Clubbing or edema, No new Rash or bruise     Data Review   Micro Results Recent Results (from the past 240 hour(s))  MRSA PCR Screening     Status: None   Collection Time:  09/11/14  7:32 PM  Result Value Ref Range Status   MRSA by PCR NEGATIVE NEGATIVE Final    Comment:        The GeneXpert MRSA Assay (FDA approved for NASAL specimens only), is one component of a comprehensive MRSA colonization surveillance program. It is not intended to diagnose MRSA infection nor to guide or monitor treatment for MRSA infections.     Radiology Reports Dg Chest 2 View  09/11/2014   CLINICAL DATA:  Cough and headache.  Fever.  EXAM: CHEST  2 VIEW  COMPARISON:  07/28/2014  FINDINGS: Stable borderline cardiomegaly,  accentuated by low volumes. Negative aortic and hilar contours. The main pulmonary artery is prominent, similar to previous. There has been recent echocardiography (January 2016).  Stable patchy subtle lung opacities, especially in the left upper lung. These correlates with ground-glass densities on preceding CTs, possibly an inflammatory pneumonia. No evidence of superimposed pneumonia or edema. No effusion or pneumothorax.  IMPRESSION: Chronic lung disease and hypoventilation. No acute superimposed findings.   Electronically Signed   By: Marnee Spring M.D.   On: 09/11/2014 12:15    CBC  Recent Labs Lab 09/11/14 1130 09/12/14 0322  WBC 7.5 5.7  HGB 15.8* 14.6  HCT 49.7* 46.3*  PLT 258 245  MCV 94.5 94.3  MCH 30.0 29.7  MCHC 31.8 31.5  RDW 13.4 13.3  LYMPHSABS 2.2  --   MONOABS 0.4  --   EOSABS 0.1  --   BASOSABS 0.0  --     Chemistries   Recent Labs Lab 09/11/14 1130 09/12/14 0322  NA 141 139  K 3.8 4.5  CL 103 105  CO2 31 25  GLUCOSE 93 132*  BUN 5* 8  CREATININE 0.66 0.39*  CALCIUM 8.7 8.6  AST 36  --   ALT 23  --   ALKPHOS 66  --   BILITOT 1.0  --    ------------------------------------------------------------------------------------------------------------------ estimated creatinine clearance is 151.8 mL/min (by C-G formula based on Cr of  0.39). ------------------------------------------------------------------------------------------------------------------ No results for input(s): HGBA1C in the last 72 hours. ------------------------------------------------------------------------------------------------------------------ No results for input(s): CHOL, HDL, LDLCALC, TRIG, CHOLHDL, LDLDIRECT in the last 72 hours. ------------------------------------------------------------------------------------------------------------------ No results for input(s): TSH, T4TOTAL, T3FREE, THYROIDAB in the last 72 hours.  Invalid input(s): FREET3 ------------------------------------------------------------------------------------------------------------------ No results for input(s): VITAMINB12, FOLATE, FERRITIN, TIBC, IRON, RETICCTPCT in the last 72 hours.  Coagulation profile No results for input(s): INR, PROTIME in the last 168 hours.  No results for input(s): DDIMER in the last 72 hours.  Cardiac Enzymes  Recent Labs Lab 09/11/14 1725  TROPONINI <0.03   ------------------------------------------------------------------------------------------------------------------ Invalid input(s): POCBNP     Time Spent in minutes   35 minutes   ELGERGAWY, DAWOOD M.D on 09/12/2014 at 11:20 AM  Between 7am to 7pm - Pager - [phone]  After 7pm go to [email or web address] - password [email or web address]  And look for the night coverage person covering for me after hours  [facility] Office  [phone]   **Disclaimer: This note may have been dictated with voice recognition software. Similar sounding words can inadvertently be transcribed and this note may contain transcription errors which may not have been corrected upon publication of note.**

## 2014-09-12 NOTE — Progress Notes (Signed)
ANTICOAGULATION CONSULT NOTE - Follow Up Consult  Pharmacy Consult for Xarelto Indication: PE  No Known Allergies  Patient Measurements: Height: 5\' 6"  (167.6 cm) Weight: (!) 315 lb (142.883 kg) IBW/kg (Calculated) : 59.3  Vital Signs: Temp: 97.9 F (36.6 C) (03/19 0733) Temp Source: Oral (03/19 0733) BP: 113/73 mmHg (03/19 0733) Pulse Rate: 81 (03/19 0733)  Labs:  Recent Labs  09/11/14 1130 09/11/14 1725 09/12/14 0322  HGB 15.8*  --  14.6  HCT 49.7*  --  46.3*  PLT 258  --  245  CREATININE 0.66  --  0.39*  TROPONINI  --  <0.03  --     Estimated Creatinine Clearance: 151.8 mL/min (by C-G formula based on Cr of 0.39).   Assessment: 28yo female with history of lupus, RA, HTN and previous PE in 04/2014 presents with cough, HA and body aches, fever, wheezing, and SOB. Pharmacy is consulted to dose xarelto for PE. Hgb 14.6, Plt 245, SCr 0.39, CrCl > 192ml/min. No s/s of bleed.  Goal of Therapy:  Monitor platelets by anticoagulation protocol: Yes   Plan:  Continue Xarelto 20mg  PO daily with supper Monitor CBC, s/s of bleed  Azaylah Stailey J 09/12/2014,9:39 AM

## 2014-09-13 LAB — BASIC METABOLIC PANEL
Anion gap: 3 — ABNORMAL LOW (ref 5–15)
BUN: 10 mg/dL (ref 6–23)
CHLORIDE: 103 mmol/L (ref 96–112)
CO2: 34 mmol/L — ABNORMAL HIGH (ref 19–32)
CREATININE: 0.46 mg/dL — AB (ref 0.50–1.10)
Calcium: 8.7 mg/dL (ref 8.4–10.5)
GFR calc Af Amer: >90 mL/min (ref >90)
GFR calc non Af Amer: >90 mL/min (ref >90)
GLUCOSE: 132 mg/dL — AB (ref 70–99)
POTASSIUM: 4.5 mmol/L (ref 3.5–5.1)
Sodium: 140 mmol/L (ref 135–145)

## 2014-09-13 LAB — URINE CULTURE: Colony Count: >=100000

## 2014-09-13 LAB — CBC
HEMATOCRIT: 44.6 % (ref 36.0–46.0)
HEMOGLOBIN: 14.2 g/dL (ref 12.0–15.0)
MCH: 29.8 pg (ref 26.0–34.0)
MCHC: 31.8 g/dL (ref 30.0–36.0)
MCV: 93.5 fL (ref 78.0–100.0)
Platelets: 256 10*3/uL (ref 150–400)
RBC: 4.77 MIL/uL (ref 3.87–5.11)
RDW: 13.3 % (ref 11.5–15.5)
WBC: 9.2 10*3/uL (ref 4.0–10.5)

## 2014-09-13 MED ORDER — METHYLPREDNISOLONE SODIUM SUCC 125 MG IJ SOLR
60.0000 mg | Freq: Two times a day (BID) | INTRAMUSCULAR | Status: DC
  Administered 2014-09-14 (×2): 60 mg via INTRAVENOUS
  Filled 2014-09-13 (×2): qty 0.96

## 2014-09-13 NOTE — Progress Notes (Signed)
UR Completed.  [phone]  

## 2014-09-13 NOTE — Progress Notes (Signed)
Patient Demographics  Donna Hall, is a 29 y.o. female, DOB - 29-Jul-1985, ZOX:[id number]  Admit date - 09/11/2014   Admitting Physician Leroy Sea, MD  Outpatient Primary MD for the patient is Lora Paula, MD  LOS - 2   Chief Complaint  Patient presents with  . Cough  . Nausea  . Emesis      Admission history of present illness/brief narrative: Donna Hall is a 29 y.o. female, with H/O ILD, Lupus, Psoriasis, Morbid obesity, On Chr Steroids, rheumatoid arthritis, PE on xaralto, who comes into the hospital with a one-week history of cough, headache and body aches, fevers at home, wheezing with shortness of breath, her symptoms did not improve and therefore she came to the ER. In the ER workup suggestive of acute on chronic respiratory failure,Patient currently condones to diffuse body aches headaches, productive cough for the last 2-3 days, gradually progressive shortness of breath and wheezing for the last 2-3 days, denies any chest pain, no abdominal pain, no diarrhea or dysuria. No focal weakness.     Subjective:   Donna Hall today has, No headache, No chest pain, No abdominal pain - No Nausea, reports generalized body ache, cough with productive sputum.  Assessment & Plan    Principal Problem:   Acute respiratory failure with hypoxemia Active Problems:   INTERSTITIAL LUNG DISEASE   Systemic lupus erythematosus   Pulmonary embolism   Anxiety and depression   Acute dyspnea   Acute and chronic respiratory failure (acute-on-chronic)  Acute respiratory failure with hypoxia - With known baseline asthma and interstitial lung disease, seems to be worsened secondary to acute bronchitis versus early pneumonia . - Still requiring oxygen - Continue with steroids, antibiotic treatment, nebs as needed, will monitor it  while at, Mucinex, for valve.  Acute bronchitis versus early pneumonia - Patient has cough, with significant productive sputum. - Continue with Rocephin and azithromycin. - Negative for flu.  Asthma exacerbation - Continue with IV steroids, we'll taper dose, nebulizer treatment.  History of PE - Continue with Xarelto  History of lupus, interstitial lung disease, rheumatoid arthritis. - For now IV steroids, hold other immunosuppressive medications while acute infectious phase continues. Thereafter follow with her primary pulmonologist and rheumatologist.     Code Status: Full code  Family Communication: None at bedside  Disposition Plan: Home once stable   Procedures  None   Consults   None   Medications  Scheduled Meds: . azithromycin  500 mg Intravenous Q24H  . cefTRIAXone (ROCEPHIN)  IV  1 g Intravenous Q24H  . guaiFENesin  1,200 mg Oral BID  . [START ON 09/14/2014] methylPREDNISolone (SOLU-MEDROL) injection  60 mg Intravenous Q12H  . mometasone-formoterol  2 puff Inhalation BID  . rivaroxaban  20 mg Oral Q supper  . sodium chloride  3 mL Intravenous Q12H   Continuous Infusions:   PRN Meds:.acetaminophen, alum & mag hydroxide-simeth, guaiFENesin-dextromethorphan, hydrOXYzine, morphine injection, ondansetron **OR** ondansetron (ZOFRAN) IV, oxyCODONE, polyethylene glycol  DVT Prophylaxis  Xarelto  Lab Results  Component Value Date   PLT 256 09/13/2014    Antibiotics    Anti-infectives    Start     Dose/Rate Route Frequency Ordered Stop   09/12/14 1530  azithromycin (  ZITHROMAX) 500 mg in dextrose 5 % 250 mL IVPB     500 mg 250 mL/hr over 60 Minutes Intravenous Every 24 hours 09/11/14 1816 09/18/14 1529   09/11/14 2000  cefTRIAXone (ROCEPHIN) 1 g in dextrose 5 % 50 mL IVPB - Premix     1 g 100 mL/hr over 30 Minutes Intravenous Every 24 hours 09/11/14 1929 09/18/14 1959   09/11/14 1615  oseltamivir (TAMIFLU) capsule 75 mg  Status:  Discontinued     75 mg  Oral 2 times daily 09/11/14 1603 09/13/14 1508   09/11/14 1615  cefTRIAXone (ROCEPHIN) 1 g in dextrose 5 % 50 mL IVPB  Status:  Discontinued     1 g 100 mL/hr over 30 Minutes Intravenous  Once 09/11/14 1606 09/11/14 1922   09/11/14 1615  cefTRIAXone (ROCEPHIN) 1 g in dextrose 5 % 50 mL IVPB  Status:  Discontinued     1 g 100 mL/hr over 30 Minutes Intravenous Every 24 hours 09/11/14 1607 09/11/14 1929   09/11/14 1615  azithromycin (ZITHROMAX) 500 mg in dextrose 5 % 250 mL IVPB  Status:  Discontinued     500 mg 250 mL/hr over 60 Minutes Intravenous Every 24 hours 09/11/14 1607 09/11/14 1816   09/11/14 1545  azithromycin (ZITHROMAX) 500 mg in dextrose 5 % 250 mL IVPB     500 mg 250 mL/hr over 60 Minutes Intravenous  Once 09/11/14 1541 09/11/14 1715          Objective:   Filed Vitals:   09/13/14 0500 09/13/14 0810 09/13/14 0954 09/13/14 1407  BP:   119/73 141/68  Pulse:   105 109  Temp:   99.1 F (37.3 C) 98.2 F (36.8 C)  TempSrc:   Oral Oral  Resp:   18 24  Height:      Weight: 142.883 kg (315 lb)     SpO2:  97% 95% 92%    Wt Readings from Last 3 Encounters:  09/13/14 142.883 kg (315 lb)  08/19/14 143.246 kg (315 lb 12.8 oz)  08/05/14 140.615 kg (310 lb)     Intake/Output Summary (Last 24 hours) at 09/13/14 1509 Last data filed at 09/13/14 1416  Gross per 24 hour  Intake    270 ml  Output   2540 ml  Net  -2270 ml     Physical Exam  Awake Alert, obese female ,Oriented X 3, No new F.N deficits, Normal affect [place].AT,PERRAL Supple Neck,No JVD, No cervical lymphadenopathy appriciated.  Symmetrical Chest wall movement, Good air movement bilaterally, wheezing still present, but much improved. RRR,No Gallops,Rubs or new Murmurs, No Parasternal Heave +ve B.Sounds, Abd Soft, No tenderness, No organomegaly appriciated, No rebound - guarding or rigidity. No Cyanosis, Clubbing or edema, No new Rash or bruise     Data Review   Micro Results Recent Results (from the  past 240 hour(s))  Culture, blood (routine x 2) Call MD if unable to obtain prior to antibiotics being given     Status: None (Preliminary result)   Collection Time: 09/11/14  4:37 PM  Result Value Ref Range Status   Specimen Description BLOOD WRIST LEFT  Final   Special Requests BOTTLES DRAWN AEROBIC ONLY 4CC  Final   Culture   Final           BLOOD CULTURE RECEIVED NO GROWTH TO DATE CULTURE WILL BE HELD FOR 5 DAYS BEFORE ISSUING A FINAL NEGATIVE REPORT Performed at [facility]    Report Status PENDING  Incomplete  Culture,  blood (routine x 2) Call MD if unable to obtain prior to antibiotics being given     Status: None (Preliminary result)   Collection Time: 09/11/14  5:25 PM  Result Value Ref Range Status   Specimen Description BLOOD ARM LEFT  Final   Special Requests BOTTLES DRAWN AEROBIC AND ANAEROBIC 5CC  Final   Culture   Final           BLOOD CULTURE RECEIVED NO GROWTH TO DATE CULTURE WILL BE HELD FOR 5 DAYS BEFORE ISSUING A FINAL NEGATIVE REPORT Performed at [facility]    Report Status PENDING  Incomplete  MRSA PCR Screening     Status: None   Collection Time: 09/11/14  7:32 PM  Result Value Ref Range Status   MRSA by PCR NEGATIVE NEGATIVE Final    Comment:        The GeneXpert MRSA Assay (FDA approved for NASAL specimens only), is one component of a comprehensive MRSA colonization surveillance program. It is not intended to diagnose MRSA infection nor to guide or monitor treatment for MRSA infections.   Urine culture     Status: None   Collection Time: 09/11/14 10:40 PM  Result Value Ref Range Status   Specimen Description URINE, RANDOM  Final   Special Requests NONE  Final   Colony Count   Final    >=100,000 COLONIES/ML Performed at [facility]    Culture   Final    Multiple bacterial morphotypes present, none predominant. Suggest appropriate recollection if clinically indicated. Performed at [facility]    Report Status  09/13/2014 FINAL  Final    Radiology Reports No results found.  CBC  Recent Labs Lab 09/11/14 1130 09/12/14 0322 09/13/14 0319  WBC 7.5 5.7 9.2  HGB 15.8* 14.6 14.2  HCT 49.7* 46.3* 44.6  PLT 258 245 256  MCV 94.5 94.3 93.5  MCH 30.0 29.7 29.8  MCHC 31.8 31.5 31.8  RDW 13.4 13.3 13.3  LYMPHSABS 2.2  --   --   MONOABS 0.4  --   --   EOSABS 0.1  --   --   BASOSABS 0.0  --   --     Chemistries   Recent Labs Lab 09/11/14 1130 09/12/14 0322 09/13/14 0319  NA 141 139 140  K 3.8 4.5 4.5  CL 103 105 103  CO2 31 25 34*  GLUCOSE 93 132* 132*  BUN 5* 8 10  CREATININE 0.66 0.39* 0.46*  CALCIUM 8.7 8.6 8.7  AST 36  --   --   ALT 23  --   --   ALKPHOS 66  --   --   BILITOT 1.0  --   --    ------------------------------------------------------------------------------------------------------------------ estimated creatinine clearance is 151.8 mL/min (by C-G formula based on Cr of 0.46). ------------------------------------------------------------------------------------------------------------------ No results for input(s): HGBA1C in the last 72 hours. ------------------------------------------------------------------------------------------------------------------ No results for input(s): CHOL, HDL, LDLCALC, TRIG, CHOLHDL, LDLDIRECT in the last 72 hours. ------------------------------------------------------------------------------------------------------------------ No results for input(s): TSH, T4TOTAL, T3FREE, THYROIDAB in the last 72 hours.  Invalid input(s): FREET3 ------------------------------------------------------------------------------------------------------------------ No results for input(s): VITAMINB12, FOLATE, FERRITIN, TIBC, IRON, RETICCTPCT in the last 72 hours.  Coagulation profile No results for input(s): INR, PROTIME in the last 168 hours.  No results for input(s): DDIMER in the last 72 hours.  Cardiac Enzymes  Recent Labs Lab 09/11/14 1725   TROPONINI <0.03   ------------------------------------------------------------------------------------------------------------------ Invalid input(s): POCBNP     Time Spent in minutes   35 minutes  Randol Kern, Dereonna Lensing M.D on 09/13/2014 at 3:09 PM  Between 7am to 7pm - Pager - [phone]  After 7pm go to [email or web address] - password [email or web address]  And look for the night coverage person covering for me after hours  [facility] Office  [phone]   **Disclaimer: This note may have been dictated with voice recognition software. Similar sounding words can inadvertently be transcribed and this note may contain transcription errors which may not have been corrected upon publication of note.**

## 2014-09-14 ENCOUNTER — Encounter (HOSPITAL_COMMUNITY): Payer: Self-pay | Admitting: General Practice

## 2014-09-14 LAB — LEGIONELLA ANTIGEN, URINE

## 2014-09-14 MED ORDER — AZITHROMYCIN 500 MG PO TABS
500.0000 mg | ORAL_TABLET | ORAL | Status: DC
  Filled 2014-09-14: qty 1

## 2014-09-14 MED ORDER — TRAMADOL HCL 50 MG PO TABS: ORAL_TABLET | ORAL | Status: DC

## 2014-09-14 MED ORDER — PREDNISONE 10 MG PO TABS: 10.0000 mg | ORAL_TABLET | Freq: Every day | ORAL | Status: DC

## 2014-09-14 MED ORDER — AZITHROMYCIN 250 MG PO TABS: ORAL_TABLET | ORAL | Status: DC

## 2014-09-14 NOTE — Progress Notes (Signed)
Interpreter Wyvonnia Dusky for [employer] Admiting

## 2014-09-14 NOTE — Discharge Summary (Signed)
[address] [place], 29 y.o., DOB 02/01/1986, MRN [id number]. Admission date: 09/11/2014 Discharge Date 09/14/2014 Primary MD Lora Paula, MD Admitting Physician Leroy Sea, MD   PCP please follow on:  - Check CBC, BMP, chest x-ray during next visit.  Admission Diagnosis  Systemic lupus erythematosus [M32.9] Cough [R05] Bronchospasm [J98.01] Obesity, Class III, BMI 40-49.9 (morbid obesity) [E66.01] Acute dyspnea [R06.00]  Discharge Diagnosis   Principal Problem:   Acute respiratory failure with hypoxemia Active Problems:   INTERSTITIAL LUNG DISEASE   Systemic lupus erythematosus   Pulmonary embolism   Anxiety and depression   Acute dyspnea   Acute and chronic respiratory failure (acute-on-chronic)   Past Medical History  Diagnosis Date  . Psoriasis 2010    as a child  . Chronic pain disorder   . Obesity   . Chronic steroid use 2010  . Lupus 2000  . Lupus (systemic lupus erythematosus) 2010  . Rheumatoid arthritis(714.0) 2010  . Hypertension 2010  . Pneumonia "several times"  . Interstitial lung disease     /notes 05/15/2014  . On home oxygen therapy     "2L; 24/7" (05/15/2014)  . Pulmonary embolism     hx/notes 05/15/2014  . History of blood transfusion     "because white cells ate the red cells"  . Headache     "just when I have the pain from aching bones and psorasis and/or lupus symptoms" (05/15/2104)  . 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 Asthma   . Gestational diabetes 2006  . Depression     Past Surgical History  Procedure Laterality Date  . Thigh / knee soft tissue biopsy Right 2011    thigh  . Cesarean section  2010     Hospital Course See H&P, Labs, Consult and Test reports for all details in brief, patient was admitted for **  Principal Problem:   Acute respiratory failure with hypoxemia Active Problems:   INTERSTITIAL LUNG DISEASE   Systemic lupus  erythematosus   Pulmonary embolism   Anxiety and depression   Acute dyspnea   Acute and chronic respiratory failure (acute-on-chronic)  Donna Hall is a 29 y.o. female, with H/O ILD, Lupus, Psoriasis, Morbid obesity, On Chr Steroids, rheumatoid arthritis, PE on xaralto, who comes into the hospital with a one-week history of cough, headache and body aches, fevers at home, wheezing with shortness of breath, her symptoms did not improve and therefore she came to the ER. In the ER workup suggestive of acute on chronic respiratory failure,Patient currently condones to diffuse body aches headaches, productive cough for the last 2-3 days, gradually progressive shortness of breath and wheezing for the last 2-3 days, denies any chest pain, no abdominal pain, no diarrhea or dysuria. No focal weakness.   Acute respiratory failure with hypoxia - With known baseline asthma and interstitial lung disease, seems to be worsened secondary to acute bronchitis versus early pneumonia . - Resolved, no further need for antibiotics.  Acute bronchitis versus early pneumonia - Treated with with Rocephin and azithromycin, continue with oral azithromycin as an outpatient to finish total of 5 days. - Negative for flu.  Asthma exacerbation - Initially on IV steroids, tapered to oral, will continue with oral prednisone taper dose as an outpatient to her baseline 10 mg oral daily.  History of PE - Continue with Xarelto  History of lupus, interstitial lung disease, rheumatoid arthritis. - Resume home medication on  discharge.    Consults  None Significant Tests:  See full reports for all details    Dg Chest 2 View  09/11/2014   CLINICAL DATA:  Cough and headache.  Fever.  EXAM: CHEST  2 VIEW  COMPARISON:  07/28/2014  FINDINGS: Stable borderline cardiomegaly, accentuated by low volumes. Negative aortic and hilar contours. The main pulmonary artery is prominent, similar to previous. There has been recent  echocardiography (January 2016).  Stable patchy subtle lung opacities, especially in the left upper lung. These correlates with ground-glass densities on preceding CTs, possibly an inflammatory pneumonia. No evidence of superimposed pneumonia or edema. No effusion or pneumothorax.  IMPRESSION: Chronic lung disease and hypoventilation. No acute superimposed findings.   Electronically Signed   By: Marnee Spring M.D.   On: 09/11/2014 12:15     Today   Subjective:   Donna Hall today has no headache,no chest abdominal pain,no new weakness tingling or numbness, feels much better wants to go home today.  Objective:   Blood pressure 127/71, pulse 68, temperature 98.4 F (36.9 C), temperature source Oral, resp. rate 18, height 5\' 6"  (1.676 m), weight 143 kg (315 lb 4.1 oz), last menstrual period 09/11/2014, SpO2 94 %.  Intake/Output Summary (Last 24 hours) at 09/14/14 1326 Last data filed at 09/14/14 0944  Gross per 24 hour  Intake    960 ml  Output      0 ml  Net    960 ml    Exam Awake Alert, Oriented *3, obese female, No new F.N deficits, Normal affect [place].AT,PERRAL Supple Neck,No JVD, No cervical lymphadenopathy appriciated.  Symmetrical Chest wall movement, Good air movement bilaterally, CTAB, no wheezing RRR,No Gallops,Rubs or new Murmurs, No Parasternal Heave +ve B.Sounds, Abd Soft, Non tender, No organomegaly appriciated, No rebound -guarding or rigidity. No Cyanosis, Clubbing or edema, No new Rash or bruise  Data Review   Cultures - Results for orders placed or performed during the hospital encounter of 09/11/14  Culture, blood (routine x 2) Call MD if unable to obtain prior to antibiotics being given     Status: None (Preliminary result)   Collection Time: 09/11/14  4:37 PM  Result Value Ref Range Status   Specimen Description BLOOD WRIST LEFT  Final   Special Requests BOTTLES DRAWN AEROBIC ONLY 4CC  Final   Culture   Final           BLOOD CULTURE RECEIVED NO  GROWTH TO DATE CULTURE WILL BE HELD FOR 5 DAYS BEFORE ISSUING A FINAL NEGATIVE REPORT Performed at 09/13/14    Report Status PENDING  Incomplete  Culture, blood (routine x 2) Call MD if unable to obtain prior to antibiotics being given     Status: None (Preliminary result)   Collection Time: 09/11/14  5:25 PM  Result Value Ref Range Status   Specimen Description BLOOD ARM LEFT  Final   Special Requests BOTTLES DRAWN AEROBIC AND ANAEROBIC 5CC  Final   Culture   Final           BLOOD CULTURE RECEIVED NO GROWTH TO DATE CULTURE WILL BE HELD FOR 5 DAYS BEFORE ISSUING A FINAL NEGATIVE REPORT Performed at 09/13/14    Report Status PENDING  Incomplete  MRSA PCR Screening     Status: None   Collection Time: 09/11/14  7:32 PM  Result Value Ref Range Status   MRSA by PCR NEGATIVE NEGATIVE Final    Comment:  The GeneXpert MRSA Assay (FDA approved for NASAL specimens only), is one component of a comprehensive MRSA colonization surveillance program. It is not intended to diagnose MRSA infection nor to guide or monitor treatment for MRSA infections.   Urine culture     Status: None   Collection Time: 09/11/14 10:40 PM  Result Value Ref Range Status   Specimen Description URINE, RANDOM  Final   Special Requests NONE  Final   Colony Count   Final    >=100,000 COLONIES/ML Performed at [facility]    Culture   Final    Multiple bacterial morphotypes present, none predominant. Suggest appropriate recollection if clinically indicated. Performed at [facility]    Report Status 09/13/2014 FINAL  Final     CBC w Diff: Lab Results  Component Value Date   WBC 9.2 09/13/2014   HGB 14.2 09/13/2014   HCT 44.6 09/13/2014   PLT 256 09/13/2014   LYMPHOPCT 29 09/11/2014   MONOPCT 5 09/11/2014   EOSPCT 1 09/11/2014   BASOPCT 0 09/11/2014   CMP: Lab Results  Component Value Date   NA 140 09/13/2014   K 4.5 09/13/2014   CL 103 09/13/2014   CO2  34* 09/13/2014   BUN 10 09/13/2014   CREATININE 0.46* 09/13/2014   CREATININE 0.57 07/03/2014   PROT 6.8 09/11/2014   ALBUMIN 3.6 09/11/2014   BILITOT 1.0 09/11/2014   ALKPHOS 66 09/11/2014   AST 36 09/11/2014   ALT 23 09/11/2014  .  Micro Results Recent Results (from the past 240 hour(s))  Culture, blood (routine x 2) Call MD if unable to obtain prior to antibiotics being given     Status: None (Preliminary result)   Collection Time: 09/11/14  4:37 PM  Result Value Ref Range Status   Specimen Description BLOOD WRIST LEFT  Final   Special Requests BOTTLES DRAWN AEROBIC ONLY 4CC  Final   Culture   Final           BLOOD CULTURE RECEIVED NO GROWTH TO DATE CULTURE WILL BE HELD FOR 5 DAYS BEFORE ISSUING A FINAL NEGATIVE REPORT Performed at [facility]    Report Status PENDING  Incomplete  Culture, blood (routine x 2) Call MD if unable to obtain prior to antibiotics being given     Status: None (Preliminary result)   Collection Time: 09/11/14  5:25 PM  Result Value Ref Range Status   Specimen Description BLOOD ARM LEFT  Final   Special Requests BOTTLES DRAWN AEROBIC AND ANAEROBIC 5CC  Final   Culture   Final           BLOOD CULTURE RECEIVED NO GROWTH TO DATE CULTURE WILL BE HELD FOR 5 DAYS BEFORE ISSUING A FINAL NEGATIVE REPORT Performed at [facility]    Report Status PENDING  Incomplete  MRSA PCR Screening     Status: None   Collection Time: 09/11/14  7:32 PM  Result Value Ref Range Status   MRSA by PCR NEGATIVE NEGATIVE Final    Comment:        The GeneXpert MRSA Assay (FDA approved for NASAL specimens only), is one component of a comprehensive MRSA colonization surveillance program. It is not intended to diagnose MRSA infection nor to guide or monitor treatment for MRSA infections.   Urine culture     Status: None   Collection Time: 09/11/14 10:40 PM  Result Value Ref Range Status   Specimen Description URINE, RANDOM  Final   Special Requests  NONE   Final   Colony Count   Final    >=100,000 COLONIES/ML Performed at [facility]    Culture   Final    Multiple bacterial morphotypes present, none predominant. Suggest appropriate recollection if clinically indicated. Performed at [facility]    Report Status 09/13/2014 FINAL  Final     Discharge Instructions      Follow-up Information    Follow up with Lora Paula, MD On 09/18/2014.   Specialty:  Family Medicine   Why:  Follow up appointment on 3/25 at 4:15p at the [facility] and [facility] information:   [address] [place] [address] [phone]       Discharge Medications     Medication List    TAKE these medications        albuterol 108 (90 BASE) MCG/ACT inhaler  Commonly known as:  PROVENTIL HFA;VENTOLIN HFA  Inhale 2 puffs into the lungs every 4 (four) hours as needed for wheezing or shortness of breath.     azaTHIOprine 50 MG tablet  Commonly known as:  IMURAN  Take 5 tablets (250 mg total) by mouth daily.     azithromycin 250 MG tablet  Commonly known as:  ZITHROMAX  1 tablet oral daily, for 3 days then stop.     hydroxychloroquine 200 MG tablet  Commonly known as:  PLAQUENIL  Take 1 tablet (200 mg total) by mouth 2 (two) times daily.     hydrOXYzine 10 MG tablet  Commonly known as:  ATARAX/VISTARIL  Take 1 tablet (10 mg total) by mouth every 8 (eight) hours as needed for itching.     mometasone-formoterol 100-5 MCG/ACT Aero  Commonly known as:  DULERA  Take 2 puffs first thing in am and then another 2 puffs about 12 hours later.     predniSONE 10 MG tablet  Commonly known as:  DELTASONE  Take 1 tablet (10 mg total) by mouth daily with breakfast. Please take 4 tablets (40 mg oral daily) 2 days, then 3 tablets (30 mg)  oral 2 days, then 2 mg oral (20 mg) oral 2 days, then go back to your baseline which is 10 mg oral daily.     rivaroxaban 20 MG Tabs tablet  Commonly known as:  XARELTO  Take  1 tablet (20 mg total) by mouth daily with supper.     traMADol 50 MG tablet  Commonly known as:  ULTRAM  1every 4 hours as needed for  pain         Total Time in preparing paper work, data evaluation and todays exam - 35 minutes  Breslyn Abdo M.D on 09/14/2014 at 1:26 PM  [facility] Office  [phone]

## 2014-09-14 NOTE — Discharge Instructions (Signed)
Follow with Primary MD FUNCHES, JOSALYN C, MD in 7 days  ° °Get CBC, CMP, 2 view Chest X ray checked  by Primary MD next visit.  ° ° °Activity: As tolerated with Full fall precautions use walker/cane & assistance as needed ° ° °Disposition Home  ° ° °Diet: Heart Healthy  , with feeding assistance and aspiration precautions as needed. ° °For Heart failure patients - Check your Weight same time everyday, if you gain over 2 pounds, or you develop in leg swelling, experience more shortness of breath or chest pain, call your Primary MD immediately. Follow Cardiac Low Salt Diet and 1.5 lit/day fluid restriction. ° ° °On your next visit with your primary care physician please Get Medicines reviewed and adjusted. ° ° °Please request your Prim.MD to go over all Hospital Tests and Procedure/Radiological results at the follow up, please get all Hospital records sent to your Prim MD by signing hospital release before you go home. ° ° °If you experience worsening of your admission symptoms, develop shortness of breath, life threatening emergency, suicidal or homicidal thoughts you must seek medical attention immediately by calling 911 or calling your MD immediately  if symptoms less severe. ° °You Must read complete instructions/literature along with all the possible adverse reactions/side effects for all the Medicines you take and that have been prescribed to you. Take any new Medicines after you have completely understood and accpet all the possible adverse reactions/side effects.  ° °Do not drive, operating heavy machinery, perform activities at heights, swimming or participation in water activities or provide baby sitting services if your were admitted for syncope or siezures until you have seen by Primary MD or a Neurologist and advised to do so again. ° °Do not drive when taking Pain medications.  ° ° °Do not take more than prescribed Pain, Sleep and Anxiety Medications ° °Special Instructions: If you have smoked or chewed  Tobacco  in the last 2 yrs please stop smoking, stop any regular Alcohol  and or any Recreational drug use. ° °Wear Seat belts while driving. ° ° °Please note ° °You were cared for by a hospitalist during your hospital stay. If you have any questions about your discharge medications or the care you received while you were in the hospital after you are discharged, you can call the unit and asked to speak with the hospitalist on call if the hospitalist that took care of you is not available. Once you are discharged, your primary care physician will handle any further medical issues. Please note that NO REFILLS for any discharge medications will be authorized once you are discharged, as it is imperative that you return to your primary care physician (or establish a relationship with a primary care physician if you do not have one) for your aftercare needs so that they can reassess your need for medications and monitor your lab values. ° °

## 2014-09-14 NOTE — Progress Notes (Signed)
NURSING PROGRESS NOTE  Donna Hall [id number] Discharge Data: 09/14/2014 1:11 PM Attending Provider: Starleen Arms, MD NWG:[id number], Ellison Carwin, MD     [name] to be D/C'd Home per MD order.  Discussed with the patient the After Visit Summary and all questions fully answered. All IV's discontinued with no bleeding noted. All belongings returned to patient for patient to take home.   Last Vital Signs:  Blood pressure 127/71, pulse 68, temperature 98.4 F (36.9 C), temperature source Oral, resp. rate 18, height 5\' 6"  (1.676 m), weight 143 kg (315 lb 4.1 oz), last menstrual period 09/11/2014, SpO2 94 %.  Discharge Medication List   Medication List    TAKE these medications        albuterol 108 (90 BASE) MCG/ACT inhaler  Commonly known as:  PROVENTIL HFA;VENTOLIN HFA  Inhale 2 puffs into the lungs every 4 (four) hours as needed for wheezing or shortness of breath.     azaTHIOprine 50 MG tablet  Commonly known as:  IMURAN  Take 5 tablets (250 mg total) by mouth daily.     azithromycin 250 MG tablet  Commonly known as:  ZITHROMAX  1 tablet oral daily, for 3 days then stop.     hydroxychloroquine 200 MG tablet  Commonly known as:  PLAQUENIL  Take 1 tablet (200 mg total) by mouth 2 (two) times daily.     hydrOXYzine 10 MG tablet  Commonly known as:  ATARAX/VISTARIL  Take 1 tablet (10 mg total) by mouth every 8 (eight) hours as needed for itching.     mometasone-formoterol 100-5 MCG/ACT Aero  Commonly known as:  DULERA  Take 2 puffs first thing in am and then another 2 puffs about 12 hours later.     predniSONE 10 MG tablet  Commonly known as:  DELTASONE  Take 1 tablet (10 mg total) by mouth daily with breakfast. Please take 4 tablets (40 mg oral daily) 2 days, then 3 tablets (30 mg)  oral 2 days, then 2 mg oral (20 mg) oral 2 days, then go back to your baseline which is 10 mg oral daily.     rivaroxaban 20 MG Tabs tablet  Commonly known as:   XARELTO  Take 1 tablet (20 mg total) by mouth daily with supper.     traMADol 50 MG tablet  Commonly known as:  ULTRAM  1every 4 hours as needed for  pain

## 2014-09-15 NOTE — Progress Notes (Signed)
CARE MANAGEMENT NOTE 09/15/2014  Patient:  Donna Hall, Donna Hall   Account Number:  [id number]  Date Initiated:  09/15/2014  Documentation initiated by:  [facility]  Subjective/Objective Assessment:   Acute respiratory failure with hypoxemia  Lupus     Action/Plan:   Anticipated DC Date:  09/14/2014   Anticipated DC Plan:  HOME/SELF CARE      DC Planning Services  CM consult      Choice offered to / List presented to:             Status of service:  Completed, signed off Medicare Important Message given?  NO (If response is "NO", the following Medicare IM given date fields will be blank) Date Medicare IM given:   Medicare IM given by:   Date Additional Medicare IM given:   Additional Medicare IM given by:    Discharge Disposition:  HOME/SELF CARE  Per UR Regulation:    If discussed at Long Length of Stay Meetings, dates discussed:    Comments:  09/15/2014 1100 Pt established with [facility] and has appt on 09/18/2014 at 4:15 pm. Pt can pick up medications from [facility] pharmacy. Isidoro Donning RN CCM Case Mgmt phone [phone]

## 2014-09-18 ENCOUNTER — Encounter: Payer: Self-pay | Admitting: Family Medicine

## 2014-09-18 ENCOUNTER — Ambulatory Visit: Payer: Self-pay | Attending: Family Medicine | Admitting: Family Medicine

## 2014-09-18 VITALS — BP 108/73 | HR 129 | Temp 98.0°F | Resp 16 | Ht 62.0 in | Wt 314.0 lb

## 2014-09-18 DIAGNOSIS — R21 Rash and other nonspecific skin eruption: Secondary | ICD-10-CM | POA: Insufficient documentation

## 2014-09-18 DIAGNOSIS — M069 Rheumatoid arthritis, unspecified: Secondary | ICD-10-CM

## 2014-09-18 DIAGNOSIS — Z6841 Body Mass Index (BMI) 40.0 and over, adult: Secondary | ICD-10-CM | POA: Insufficient documentation

## 2014-09-18 DIAGNOSIS — L409 Psoriasis, unspecified: Secondary | ICD-10-CM

## 2014-09-18 DIAGNOSIS — M329 Systemic lupus erythematosus, unspecified: Secondary | ICD-10-CM | POA: Insufficient documentation

## 2014-09-18 DIAGNOSIS — E66813 Obesity, class 3: Secondary | ICD-10-CM

## 2014-09-18 DIAGNOSIS — IMO0002 Reserved for concepts with insufficient information to code with codable children: Secondary | ICD-10-CM

## 2014-09-18 DIAGNOSIS — L299 Pruritus, unspecified: Secondary | ICD-10-CM

## 2014-09-18 LAB — CULTURE, BLOOD (ROUTINE X 2)
CULTURE: NO GROWTH
Culture: NO GROWTH

## 2014-09-18 MED ORDER — DIPHENHYDRAMINE HCL 2 % EX CREA: TOPICAL_CREAM | Freq: Three times a day (TID) | CUTANEOUS | Status: DC | PRN

## 2014-09-18 MED ORDER — TRAMADOL HCL 50 MG PO TABS: 50.0000 mg | ORAL_TABLET | Freq: Four times a day (QID) | ORAL | Status: DC | PRN

## 2014-09-18 MED ORDER — HYDROXYZINE HCL 10 MG PO TABS: 10.0000 mg | ORAL_TABLET | Freq: Three times a day (TID) | ORAL | Status: DC | PRN

## 2014-09-18 MED ORDER — HYDROXYCHLOROQUINE SULFATE 200 MG PO TABS: 200.0000 mg | ORAL_TABLET | Freq: Two times a day (BID) | ORAL | Status: DC

## 2014-09-18 MED ORDER — PREDNISONE 10 MG PO TABS: 10.0000 mg | ORAL_TABLET | Freq: Every day | ORAL | Status: DC

## 2014-09-18 NOTE — Assessment & Plan Note (Addendum)
For itching: hydroxyzine, benadryl cream. Prednisone also helps itching.  ? Tramadol contributing to itching

## 2014-09-18 NOTE — Progress Notes (Signed)
   Subjective:    Patient ID: Donna Hall, female    DOB: 18-Sep-1985, 29 y.o.   MRN: [id number] CC: HFU dyspnea  HPI  1. Lupus: compliant with medication regimen. Has bone pain. Tramadol helps a bit. Needs refill. Has mild skin rash. Has itching.   2. interstitial lung dz vs asthma: taking dulera. Taking prednisone down to 30 mg on taper today. 20 mg tomorrow and next day, then baseline of 10 mg.   3. Itching: in scalp and body. This is chronic. Taking hydroxyzine. Taking prednisone.   Soc Hx: non smoker   Review of Systems  Constitutional:       Weight gain   Respiratory: Negative for chest tightness and shortness of breath.   Cardiovascular: Negative for chest pain.  Skin: Positive for rash.       Itching   Hematological: Does not bruise/bleed easily.       Objective:   Physical Exam BP 108/73 mmHg  Pulse 129  Temp(Src) 98 F (36.7 C)  Resp 16  Ht 5\' 2"  (1.575 m)  Wt 314 lb (142.429 kg)  BMI 57.42 kg/m2  SpO2 89%  LMP 09/11/2014 ambulating  Resting HR 118, O2 sat 94  Head: normal, no scalp lesions  General appearance: alert, cooperative, no distress and morbidly obese Lungs: clear to auscultation bilaterally Heart: regular rate and rhythm, S1, S2 normal, no murmur, click, rub or gallop Extremities: extremities normal, atraumatic, no cyanosis or edema  Skin: hyperpigmented and thick behind neck      Assessment & Plan:

## 2014-09-18 NOTE — Assessment & Plan Note (Signed)
A: gaining weight  P:

## 2014-09-18 NOTE — Progress Notes (Signed)
Patient here to follow up on SOB, pain Patient states SOB is better but she is having pain in her knees and feet when she ambulates She is asking for refill of Tramadol

## 2014-09-18 NOTE — Assessment & Plan Note (Signed)
Prednisone 10 mg daily is baseline dose. Continue 30 mg today, 20 mg Sat, 20 mg Sun, 10 mg Mon.

## 2014-09-18 NOTE — Patient Instructions (Signed)
Donna Hall,  Thank you for coming in to see me today.  1. Prednisone 10 mg daily is baseline dose. Continue 30 mg today, 20 mg Sat, 20 mg Sun, 10 mg Mon.  2. For pain: tramadol refilled  3. For itching: hydroxyzine, benadryl cream. Prednisone also helps itching.   F/u in 6 weeks   Dr. Armen Pickup

## 2014-09-18 NOTE — Assessment & Plan Note (Signed)
A: RA and lupus with pain P:  For pain: tramadol refilled. Tramadol may be contributing to itching will discuss switching to tylenol#3 with patient at f/u

## 2014-09-23 ENCOUNTER — Telehealth: Payer: Self-pay | Admitting: *Deleted

## 2014-09-23 NOTE — Telephone Encounter (Signed)
Left voice message with normal labs (message in Spanish)

## 2014-09-23 NOTE — Telephone Encounter (Signed)
-----   Message from Dessa Phi, MD sent at 09/18/2014  9:01 AM EDT ----- Negative blood culture from 09/11/14

## 2014-09-27 ENCOUNTER — Ambulatory Visit (HOSPITAL_BASED_OUTPATIENT_CLINIC_OR_DEPARTMENT_OTHER): Payer: Medicaid Other | Attending: Family Medicine

## 2014-09-27 VITALS — Ht 62.0 in | Wt 305.0 lb

## 2014-09-27 DIAGNOSIS — G4733 Obstructive sleep apnea (adult) (pediatric): Secondary | ICD-10-CM | POA: Insufficient documentation

## 2014-09-27 DIAGNOSIS — R0683 Snoring: Secondary | ICD-10-CM | POA: Insufficient documentation

## 2014-09-27 DIAGNOSIS — J8410 Pulmonary fibrosis, unspecified: Secondary | ICD-10-CM

## 2014-09-27 DIAGNOSIS — G4730 Sleep apnea, unspecified: Secondary | ICD-10-CM

## 2014-10-10 DIAGNOSIS — G4730 Sleep apnea, unspecified: Secondary | ICD-10-CM

## 2014-10-10 NOTE — Sleep Study (Signed)
   NAME: Donna Hall DATE OF BIRTH:  Jul 04, 1985 MEDICAL RECORD NUMBER [id number]  LOCATION: [facility]  PHYSICIAN: YOUNG,CLINTON D  DATE OF STUDY: 09/27/2014  SLEEP STUDY TYPE: Nocturnal Polysomnogram               REFERRING PHYSICIAN: Dessa Phi, MD  INDICATION FOR STUDY: Hypersomnia with sleep apnea  EPWORTH SLEEPINESS SCORE:   6/24 HEIGHT: 5\' 2"  (157.5 cm)  WEIGHT: (!) 305 lb (138.347 kg)    Body mass index is 55.77 kg/(m^2).  NECK SIZE: 21 in.  MEDICATIONS: Charted for review  SLEEP ARCHITECTURE: Split study protocol. During the diagnostic phase, total sleep time 151.5 minutes with sleep efficiency 85.8%. Stage I was 4.3% stage II 78.5% stage III absent REM 17.2% of total sleep time. Sleep latency 7.5 minutes, REM latency 97 minutes, awake after sleep onset 17.5 minutes, arousal index 0.4, bedtime medication: None  RESPIRATORY DATA: Apnea hypopnea index (AHI) 17.8 per hour. 45 total events scored including 7 obstructive apneas, 2 mixed apneas, 36 hypopneas. Events were not positional. REM AHI 76.2 per hour. CPAP was titrated to 20 CWP with residual hypopneas at all pressures. Best control was at CPAP 16, AHI 4.0 per hour. She wore a fullface mask.  OXYGEN DATA: Loud snoring before CPAP. Room air saturation on arrival was 89%. With persistent desaturation into the 80s, supplemental oxygen was added at 2 L/m starting at 12:05 AM and was resumed with CPAP at 5:09 AM.  CARDIAC DATA: Sinus tachycardia with mean heart rate 96.1/m  MOVEMENT/PARASOMNIA: No significant movement disturbance, no bathroom trips.  IMPRESSION/ RECOMMENDATION:   1) Moderate obstructive sleep apnea/hypopnea syndrome, AHI 17.8 per hour with non-positional events. Most events were related to REM sleep with REM AHI 76.2 per hour. Loud snoring before CPAP. 2) CPAP titration to 20 CWP with persistent hypopneas at all pressures. Best results obtained at CPAP 16, AHI 4.0 per hour.  Recommend this as starting pressure for home use. She wore a medium F[employer]P Simplus fullface mask with heated humidifier. Snoring was prevented by CPAP. 3) Room air oxygen saturation on arrival while upright and awake was only 89%, indicating underlying cardiopulmonary disease. Supplemental oxygen was provided at 2 L/m 71% on room air. Best oxygen saturation with supplemental oxygen and CPAP was 95%. During total sleep time before CPAP and supplemental oxygen, mean oxygen saturation was 87.9% on room air. 80.8 minutes were recorded with room air saturation less than 88%.  Diplomate, American Board of Sleep Medicine  ELECTRONICALLY SIGNED ON:  10/10/2014, 9:30 AM [facility] PH: [phone]   FX: [phone] ACCREDITED BY THE AMERICAN ACADEMY OF SLEEP MEDICINE

## 2014-10-13 ENCOUNTER — Emergency Department (HOSPITAL_COMMUNITY)
Admission: EM | Admit: 2014-10-13 | Discharge: 2014-10-13 | Disposition: A | Discharge status: Home / Self Care | Payer: Medicaid Other | Attending: Emergency Medicine | Admitting: Emergency Medicine

## 2014-10-13 ENCOUNTER — Encounter (HOSPITAL_COMMUNITY): Payer: Self-pay | Admitting: Neurology

## 2014-10-13 ENCOUNTER — Telehealth: Payer: Self-pay | Admitting: Family Medicine

## 2014-10-13 DIAGNOSIS — Z86711 Personal history of pulmonary embolism: Secondary | ICD-10-CM | POA: Diagnosis not present

## 2014-10-13 DIAGNOSIS — M329 Systemic lupus erythematosus, unspecified: Secondary | ICD-10-CM | POA: Diagnosis not present

## 2014-10-13 DIAGNOSIS — IMO0001 Reserved for inherently not codable concepts without codable children: Secondary | ICD-10-CM

## 2014-10-13 DIAGNOSIS — F329 Major depressive disorder, single episode, unspecified: Secondary | ICD-10-CM | POA: Insufficient documentation

## 2014-10-13 DIAGNOSIS — L03012 Cellulitis of left finger: Secondary | ICD-10-CM | POA: Insufficient documentation

## 2014-10-13 DIAGNOSIS — J45909 Unspecified asthma, uncomplicated: Secondary | ICD-10-CM | POA: Insufficient documentation

## 2014-10-13 DIAGNOSIS — Z7901 Long term (current) use of anticoagulants: Secondary | ICD-10-CM | POA: Diagnosis not present

## 2014-10-13 DIAGNOSIS — Z8632 Personal history of gestational diabetes: Secondary | ICD-10-CM | POA: Insufficient documentation

## 2014-10-13 DIAGNOSIS — I10 Essential (primary) hypertension: Secondary | ICD-10-CM | POA: Diagnosis not present

## 2014-10-13 DIAGNOSIS — E669 Obesity, unspecified: Secondary | ICD-10-CM | POA: Insufficient documentation

## 2014-10-13 DIAGNOSIS — Z8701 Personal history of pneumonia (recurrent): Secondary | ICD-10-CM | POA: Insufficient documentation

## 2014-10-13 DIAGNOSIS — Z79899 Other long term (current) drug therapy: Secondary | ICD-10-CM | POA: Diagnosis not present

## 2014-10-13 DIAGNOSIS — Z7952 Long term (current) use of systemic steroids: Secondary | ICD-10-CM | POA: Diagnosis not present

## 2014-10-13 DIAGNOSIS — G4733 Obstructive sleep apnea (adult) (pediatric): Secondary | ICD-10-CM | POA: Insufficient documentation

## 2014-10-13 DIAGNOSIS — Z9981 Dependence on supplemental oxygen: Secondary | ICD-10-CM | POA: Insufficient documentation

## 2014-10-13 DIAGNOSIS — M069 Rheumatoid arthritis, unspecified: Secondary | ICD-10-CM | POA: Diagnosis not present

## 2014-10-13 DIAGNOSIS — G894 Chronic pain syndrome: Secondary | ICD-10-CM | POA: Diagnosis not present

## 2014-10-13 DIAGNOSIS — M79645 Pain in left finger(s): Secondary | ICD-10-CM | POA: Diagnosis present

## 2014-10-13 LAB — CBC WITH DIFFERENTIAL/PLATELET
BASOS ABS: 0 10*3/uL (ref 0.0–0.1)
Basophils Relative: 0 % (ref 0–1)
Eosinophils Absolute: 0 10*3/uL (ref 0.0–0.7)
Eosinophils Relative: 0 % (ref 0–5)
HCT: 45.8 % (ref 36.0–46.0)
Hemoglobin: 14.5 g/dL (ref 12.0–15.0)
Lymphocytes Relative: 25 % (ref 12–46)
Lymphs Abs: 1.9 10*3/uL (ref 0.7–4.0)
MCH: 27.7 pg (ref 26.0–34.0)
MCHC: 31.7 g/dL (ref 30.0–36.0)
MCV: 87.6 fL (ref 78.0–100.0)
Monocytes Absolute: 0.5 10*3/uL (ref 0.1–1.0)
Monocytes Relative: 7 % (ref 3–12)
NEUTROS ABS: 5.2 10*3/uL (ref 1.7–7.7)
NEUTROS PCT: 68 % (ref 43–77)
Platelets: 264 10*3/uL (ref 150–400)
RBC: 5.23 MIL/uL — AB (ref 3.87–5.11)
RDW: 14.3 % (ref 11.5–15.5)
WBC: 7.7 10*3/uL (ref 4.0–10.5)

## 2014-10-13 LAB — BASIC METABOLIC PANEL
ANION GAP: 9 (ref 5–15)
BUN: 6 mg/dL (ref 6–23)
CALCIUM: 8.7 mg/dL (ref 8.4–10.5)
CO2: 28 mmol/L (ref 19–32)
Chloride: 103 mmol/L (ref 96–112)
Creatinine, Ser: 0.58 mg/dL (ref 0.50–1.10)
GFR calc Af Amer: >90 mL/min (ref >90)
Glucose, Bld: 94 mg/dL (ref 70–99)
Potassium: 4.1 mmol/L (ref 3.5–5.1)
Sodium: 140 mmol/L (ref 135–145)

## 2014-10-13 MED ORDER — TRAMADOL HCL 50 MG PO TABS: 50.0000 mg | ORAL_TABLET | Freq: Four times a day (QID) | ORAL | Status: DC | PRN

## 2014-10-13 MED ORDER — LIDOCAINE HCL (PF) 2 % IJ SOLN
0.0000 mL | Freq: Once | INTRAMUSCULAR | Status: AC | PRN
  Administered 2014-10-13: 10 mL via INTRADERMAL
  Filled 2014-10-13 (×2): qty 10

## 2014-10-13 MED ORDER — HYDROCODONE-ACETAMINOPHEN 5-325 MG PO TABS
2.0000 | ORAL_TABLET | Freq: Once | ORAL | Status: AC
  Administered 2014-10-13: 2 via ORAL
  Filled 2014-10-13: qty 2

## 2014-10-13 MED ORDER — CEPHALEXIN 500 MG PO CAPS: 500.0000 mg | ORAL_CAPSULE | Freq: Four times a day (QID) | ORAL | Status: DC

## 2014-10-13 NOTE — ED Notes (Signed)
Swelling and purple discoloration to posterior nail bed on left ring finger, also joint pain and leg swelling for several days.

## 2014-10-13 NOTE — ED Provider Notes (Signed)
CSN: [id number]     Arrival date & time 10/13/14  1617 History   First MD Initiated Contact with Patient 10/13/14 1801     Chief Complaint  Patient presents with  . Hand Pain  . Leg Swelling  . Joint Pain     (Consider location/radiation/quality/duration/timing/severity/associated sxs/prior Treatment) HPI  Donna Hall is a 29 y.o. female with PMH of lupus, rheumatoid arthritis on chronic steroids, pulmonary embolism, chronic pain, obesity presenting with one-day history of left fourth finger pain which started as a small dot but has become tender, ecchymotic and is on the ulnar part of nail. History tender to touch. Patient reports taking tramadol and prednisone but is out of her tramadol. Patient denies fevers or chills. She does take Xarelto for pulmonary embolisms and has for the past 2 years. She denies any chest pain or shortness of breath. Patient reports generalized aching of bones diffusely with history of lupus. She denies acute worsening. Patient normally takes tramadol for this discomfort.   Past Medical History  Diagnosis Date  . Psoriasis 2010    as a child  . Chronic pain disorder   . Obesity   . Chronic steroid use 2010  . Lupus 2000  . Lupus (systemic lupus erythematosus) 2010  . Rheumatoid arthritis(714.0) 2010  . Hypertension 2010  . Pneumonia "several times"  . Interstitial lung disease     /notes 05/15/2014  . On home oxygen therapy     "2L; 24/7" (05/15/2014)  . Pulmonary embolism     hx/notes 05/15/2014  . History of blood transfusion     "because white cells ate the red cells"  . Headache     "just when I have the pain from aching bones and psorasis and/or lupus symptoms" (05/15/2104)  . 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 Asthma   . Gestational diabetes 2006  . Depression    Past Surgical History  Procedure Laterality Date  . Thigh / knee soft tissue biopsy Right 2011   thigh  . Cesarean section  2010   Family History  Problem Relation Age of Onset  . Diabetes Mother   . Hypertension Mother   . Cancer Neg Hx   . Early death Neg Hx   . Heart disease Neg Hx    History  Substance Use Topics  . Smoking status: Never Smoker   . Smokeless tobacco: Never Used  . Alcohol Use: No   OB History    No data available     Review of Systems 10 Systems reviewed and are negative for acute change except as noted in the HPI.    Allergies  Review of patient's allergies indicates no known allergies.  Home Medications   Prior to Admission medications   Medication Sig Start Date End Date Taking? Authorizing Provider  albuterol (PROVENTIL HFA;VENTOLIN HFA) 108 (90 BASE) MCG/ACT inhaler Inhale 2 puffs into the lungs every 4 (four) hours as needed for wheezing or shortness of breath. 08/05/14  Yes Nyoka Cowden, MD  azaTHIOprine (IMURAN) 50 MG tablet Take 5 tablets (250 mg total) by mouth daily. 06/29/14  Yes Josalyn Funches, MD  hydroxychloroquine (PLAQUENIL) 200 MG tablet Take 1 tablet (200 mg total) by mouth 2 (two) times daily. 09/18/14  Yes Dessa Phi, MD  hydrOXYzine (ATARAX/VISTARIL) 10 MG tablet Take 1 tablet (10 mg total) by mouth every 8 (eight) hours as needed for itching. 09/18/14  Yes  Dessa Phi, MD  mometasone-formoterol (DULERA) 100-5 MCG/ACT AERO Take 2 puffs first thing in am and then another 2 puffs about 12 hours later. Patient taking differently: Inhale 2 puffs into the lungs every 12 (twelve) hours.  07/22/14  Yes Nyoka Cowden, MD  predniSONE (DELTASONE) 10 MG tablet Take 1 tablet (10 mg total) by mouth daily with breakfast. 09/18/14  Yes Dessa Phi, MD  Rivaroxaban (XARELTO) 15 MG TABS tablet Take 15 mg by mouth daily with supper.   Yes Historical Provider, MD  cephALEXin (KEFLEX) 500 MG capsule Take 1 capsule (500 mg total) by mouth 4 (four) times daily. 10/13/14   Oswaldo Conroy, PA-C  diphenhydrAMINE (BENADRYL) 2 % cream Apply  topically 3 (three) times daily as needed for itching. 09/18/14   Dessa Phi, MD  rivaroxaban (XARELTO) 20 MG TABS tablet Take 1 tablet (20 mg total) by mouth daily with supper. 04/02/14   Josalyn Funches, MD  traMADol (ULTRAM) 50 MG tablet Take 1 tablet (50 mg total) by mouth every 6 (six) hours as needed. 10/13/14   Oswaldo Conroy, PA-C   BP 138/95 mmHg  Pulse 93  Temp(Src) 99.1 F (37.3 C) (Oral)  Resp 18  SpO2 97%  LMP 10/06/2014 Physical Exam  Constitutional: She appears well-developed and well-nourished. No distress.  HENT:  Head: Normocephalic and atraumatic.  Eyes: Conjunctivae and EOM are normal. Right eye exhibits no discharge. Left eye exhibits no discharge.  Cardiovascular: Normal rate and regular rhythm.   2+ radial pulses equal bilaterally.  Pulmonary/Chest: Effort normal and breath sounds normal. No respiratory distress. She has no wheezes.  Abdominal: Soft. Bowel sounds are normal. She exhibits no distension. There is no tenderness.  Musculoskeletal:  Paronychia left fourth finger ulnar side with bruising. See below. No spontaneous drainage. Straight and sensation intact.  Neurological: She is alert. She exhibits normal muscle tone. Coordination normal.  Skin: Skin is warm and dry. She is not diaphoretic.  Nursing note and vitals reviewed.        ED Course  Procedures (including critical care time) Labs Review Labs Reviewed  CBC WITH DIFFERENTIAL/PLATELET - Abnormal; Notable for the following:    RBC 5.23 (*)    All other components within normal limits  BASIC METABOLIC PANEL    Imaging Review No results found.   EKG Interpretation None      INCISION AND DRAINAGE Performed by: Oswaldo Conroy Consent: Verbal consent obtained. Risks and benefits: risks, benefits and alternatives were discussed Type: Paronychia   Body area: Left fourth finger  Anesthesia: local infiltration  Incision was made with a scalpel.  Local anesthetic: lidocaine  2% w/o epinephrine for digital block   Anesthetic total: 4 ml  Complexity: complex  Drainage: purulent  Drainage amount: Moderate   Packing material: None   Patient tolerance: Patient tolerated the procedure well with no immediate complications.     MDM   Final diagnoses:  Paronychia of fourth finger, left  Lupus   Patient with history of lupus on chronic steroids as well as Xarelto for pulmonary and is on presenting with paronychia as well as generalized lupus pain described as "aching in my bones". Left fourth finger neurovascularly intact. Consult to hand surgery who recommended I and D. Patient tolerated procedures well with digital block. Patient to follow-up with primary for follow-up with lupus pain. I've refilled her tramadol prescription. Driving and sedation precautions provided. Also prescribed Keflex for paronychia due to patient's immunosuppression.   Discussed return precautions with patient. Discussed all  results and patient verbalizes understanding and agrees with plan.  Case has been discussed with Dr. Criss Alvine who agrees with the above plan and to discharge.      Oswaldo Conroy, PA-C 10/13/14 [id number]  Pricilla Loveless, MD 10/17/14 [phone]

## 2014-10-13 NOTE — Assessment & Plan Note (Signed)
A: patient with moderate sleep apnea. Obesity. His risk for pulm HTN and R sided heart failure. Needs CPAP. P: Will refer cases to SW/

## 2014-10-13 NOTE — Discharge Instructions (Signed)
Return to the emergency room with worsening of symptoms, new symptoms or with symptoms that are concerning, especially fevers, redness, swelling, red streaks, pus. Please take all of your antibiotics until finished!   You may develop abdominal discomfort or diarrhea from the antibiotic.  You may help offset this with probiotics which you can buy or get in yogurt. Do not eat  or take the probiotics until 2 hours after your antibiotic.  Continue with warm soaks.  Please call your doctor for a followup appointment within 24-48 hours. When you talk to your doctor please let them know that you were seen in the emergency department and have them acquire all of your records so that they can discuss the findings with you and formulate a treatment plan to fully care for your new and ongoing problems. Read below information and follow recommendations. Paroniquia (Paronychia) La paroniquia es una reaccin inflamatoria que involucra los pliegues de la piel que rodea la ua. Generalmente se debe a una infeccin en la piel que rodea la ua. La causa ms comn es el lavado frecuente de las manos (como en el caso de los Kenneth, mozos, enfermeros y Heritage manager personas que necesitan mojarse las manos. Esto hace que la piel que rodea la ua sea susceptible a las infecciones por bacterias (grmenes) u hongos. Otros factores que predisponen son:  Edwin Dada agresivos.  Morderse las uas.  Succionar Multimedia programmer. La causa ms comn es una infeccin por estafilococo (un germen) o una infeccin por hongos (candida). Cuando la causa es un germen, generalmente el comienzo es sbito y doloroso con enrojecimiento, hinchazn, pus y Engineer, mining. Puede aparecer bajo la ua y formar un absceso (acumulacin de pus) o formar un absceso alrededor de la ua. Si la ua est infectada con un hongo, el tratamiento es prolongado y puede requerir medicamentos por va oral durante un ao. El mdico decidir la cantidad de tiempo que requerir Technical sales engineer. La paroniquia causada por bacterias (grmenes) puede evitarse si no se quitan los padrastros o se cortan las cutculas. Cuando la infeccin ocurre en la punta del dedo se denomina panadizo. Si la causa es el virus del herpes simplex, se denomina panadizo herptico. TRATAMIENTO El tratamiento consiste en la incisin y el drenaje cuando hay un absceso. Esto significa que el absceso debe abrirse para que el pus pueda salir. Debe seguir los cuidados que se recomiendan a continuacin. INSTRUCCIONES PARA EL CUIDADO DOMICILIARIO  Es importante mantener las reas afectadas limpias y secas. Use guantes de goma o plstico sobre guantes de algodn cuando deba mojarse la [place].  Entre los perodos de enjuagues con agua tibia, mantenga la herida limpia, seca y vendada como se lo indic el profesional que lo asiste.  Si tiene una infeccin bacteriana, sumerja la mano en agua tibia entre 15 y 20 minutos, tres o cuatro Teacher, early years/pre. Las infecciones fngicas son muy difciles de tratar, por lo tanto requieren tratamiento durante largos perodos.  Tome los antibiticos (medicamentos que [employer] grmenes) para las infecciones bacterianas, segn las indicaciones. Termine todos los [employer], an si el problema parece estar resuelto antes de finalizarlos.  Utilice los medicamentos de venta libre o de prescripcin para Chief Technology Officer, Environmental health practitioner o la [place], segn se lo indique el profesional que lo asiste. SOLICITE ATENCIN MDICA DE INMEDIATO SI:  Presenta enrojecimiento, hinchazn o aumento del dolor en la herida.  Aparece pus en la herida.  Tiene fiebre.  Advierte un olor ftido que proviene de la herida  o del vendaje. Document Released: 03/22/2005 Document Revised: 09/04/2011 [facility] Patient Information 2015 [place], [place]. This information is not intended to replace advice given to you by your health care provider. Make sure you discuss any questions you have with your health care  provider.

## 2014-10-13 NOTE — ED Notes (Signed)
Pt called out reporting "much pain" in her finger.

## 2014-10-13 NOTE — Telephone Encounter (Signed)
Francena Hanly, please inform patient of sleep study results. She needs CPAP machine for moderate sleep apnea.  Asher Muir. This is a high risk patient with limited resources, undocumented, needs CPAP. I appreciate any help you can offer.   Moderate obstructive sleep apnea/hypopnea syndrome, AHI 17.8 per hour with non-positional events. Most events were related to REM sleep with REM AHI 76.2 per hour. Loud snoring before CPAP. 2) CPAP titration to 20 CWP with persistent hypopneas at all pressures. Best results obtained at CPAP 16, AHI 4.0 per hour. Recommend this as starting pressure for home use. She wore a medium F&P Simplus fullface mask with heated humidifier. Snoring was prevented by CPAP. 3) Room air oxygen saturation on arrival while upright and awake was only 89%, indicating underlying cardiopulmonary disease. Supplemental oxygen was provided at 2 L/m 71% on room air. Best oxygen saturation with supplemental oxygen and CPAP was 95%. During total sleep time before CPAP and supplemental oxygen, mean oxygen saturation was 87.9% on room air. 80.8 minutes were recorded with room air saturation less than 88%.

## 2014-10-13 NOTE — ED Notes (Addendum)
Per Spanish interpreter: Pt c/o L ring finger pain since yesterday. Reports a white dot appeared then the purple area started. Area to L ring finger noted to be swollen and tender to touch. Pt also c/o "aching in my bones;" hx of lupus. Reports taking tramadol and prednisone, has finished those prescriptions.

## 2014-10-14 NOTE — Telephone Encounter (Signed)
Left voice message to return call 

## 2014-10-23 ENCOUNTER — Emergency Department (HOSPITAL_COMMUNITY): Payer: Medicaid Other

## 2014-10-23 ENCOUNTER — Encounter (HOSPITAL_COMMUNITY): Payer: Self-pay | Admitting: *Deleted

## 2014-10-23 ENCOUNTER — Inpatient Hospital Stay (HOSPITAL_COMMUNITY)
Admission: EM | Admit: 2014-10-23 | Discharge: 2014-10-26 | DRG: 546 | Disposition: A | Discharge status: Home / Self Care | Payer: Medicaid Other | Attending: Internal Medicine | Admitting: Internal Medicine

## 2014-10-23 DIAGNOSIS — R079 Chest pain, unspecified: Secondary | ICD-10-CM

## 2014-10-23 DIAGNOSIS — Z7952 Long term (current) use of systemic steroids: Secondary | ICD-10-CM

## 2014-10-23 DIAGNOSIS — G894 Chronic pain syndrome: Secondary | ICD-10-CM | POA: Diagnosis present

## 2014-10-23 DIAGNOSIS — R312 Other microscopic hematuria: Secondary | ICD-10-CM | POA: Diagnosis present

## 2014-10-23 DIAGNOSIS — Z7901 Long term (current) use of anticoagulants: Secondary | ICD-10-CM | POA: Diagnosis present

## 2014-10-23 DIAGNOSIS — IMO0002 Reserved for concepts with insufficient information to code with codable children: Secondary | ICD-10-CM | POA: Diagnosis present

## 2014-10-23 DIAGNOSIS — K625 Hemorrhage of anus and rectum: Secondary | ICD-10-CM | POA: Diagnosis present

## 2014-10-23 DIAGNOSIS — J45909 Unspecified asthma, uncomplicated: Secondary | ICD-10-CM | POA: Diagnosis present

## 2014-10-23 DIAGNOSIS — R51 Headache: Secondary | ICD-10-CM

## 2014-10-23 DIAGNOSIS — E041 Nontoxic single thyroid nodule: Secondary | ICD-10-CM | POA: Diagnosis present

## 2014-10-23 DIAGNOSIS — A630 Anogenital (venereal) warts: Secondary | ICD-10-CM | POA: Diagnosis present

## 2014-10-23 DIAGNOSIS — M069 Rheumatoid arthritis, unspecified: Secondary | ICD-10-CM | POA: Diagnosis present

## 2014-10-23 DIAGNOSIS — L409 Psoriasis, unspecified: Secondary | ICD-10-CM | POA: Diagnosis present

## 2014-10-23 DIAGNOSIS — Z86711 Personal history of pulmonary embolism: Secondary | ICD-10-CM

## 2014-10-23 DIAGNOSIS — R519 Headache, unspecified: Secondary | ICD-10-CM | POA: Diagnosis present

## 2014-10-23 DIAGNOSIS — Z9114 Patient's other noncompliance with medication regimen: Secondary | ICD-10-CM | POA: Diagnosis present

## 2014-10-23 DIAGNOSIS — B370 Candidal stomatitis: Secondary | ICD-10-CM | POA: Diagnosis present

## 2014-10-23 DIAGNOSIS — I2699 Other pulmonary embolism without acute cor pulmonale: Secondary | ICD-10-CM | POA: Diagnosis present

## 2014-10-23 DIAGNOSIS — G8929 Other chronic pain: Secondary | ICD-10-CM | POA: Diagnosis present

## 2014-10-23 DIAGNOSIS — I10 Essential (primary) hypertension: Secondary | ICD-10-CM | POA: Diagnosis present

## 2014-10-23 DIAGNOSIS — J849 Interstitial pulmonary disease, unspecified: Secondary | ICD-10-CM | POA: Diagnosis present

## 2014-10-23 DIAGNOSIS — R0602 Shortness of breath: Secondary | ICD-10-CM

## 2014-10-23 DIAGNOSIS — M329 Systemic lupus erythematosus, unspecified: Secondary | ICD-10-CM | POA: Diagnosis present

## 2014-10-23 LAB — COMPREHENSIVE METABOLIC PANEL
ALK PHOS: 67 U/L (ref 39–117)
ALT: 17 U/L (ref 0–35)
ANION GAP: 7 (ref 5–15)
AST: 24 U/L (ref 0–37)
Albumin: 3.3 g/dL — ABNORMAL LOW (ref 3.5–5.2)
BILIRUBIN TOTAL: 0.6 mg/dL (ref 0.3–1.2)
BUN: 12 mg/dL (ref 6–23)
CO2: 28 mmol/L (ref 19–32)
CREATININE: 0.52 mg/dL (ref 0.50–1.10)
Calcium: 8.4 mg/dL (ref 8.4–10.5)
Chloride: 105 mmol/L (ref 96–112)
GFR calc Af Amer: >90 mL/min (ref >90)
GLUCOSE: 103 mg/dL — AB (ref 70–99)
Potassium: 3.6 mmol/L (ref 3.5–5.1)
SODIUM: 140 mmol/L (ref 135–145)
TOTAL PROTEIN: 6.3 g/dL (ref 6.0–8.3)

## 2014-10-23 LAB — INFLUENZA PANEL BY PCR (TYPE A & B)
H1N1FLUPCR: NOT DETECTED
INFLAPCR: NEGATIVE
INFLBPCR: NEGATIVE

## 2014-10-23 LAB — URINE MICROSCOPIC-ADD ON

## 2014-10-23 LAB — CBC WITH DIFFERENTIAL/PLATELET
BASOS ABS: 0 10*3/uL (ref 0.0–0.1)
Basophils Relative: 0 % (ref 0–1)
EOS PCT: 2 % (ref 0–5)
Eosinophils Absolute: 0.2 10*3/uL (ref 0.0–0.7)
HEMATOCRIT: 46.2 % — AB (ref 36.0–46.0)
HEMOGLOBIN: 14.2 g/dL (ref 12.0–15.0)
Lymphocytes Relative: 17 % (ref 12–46)
Lymphs Abs: 1.5 10*3/uL (ref 0.7–4.0)
MCH: 26.4 pg (ref 26.0–34.0)
MCHC: 30.7 g/dL (ref 30.0–36.0)
MCV: 86 fL (ref 78.0–100.0)
MONO ABS: 0.3 10*3/uL (ref 0.1–1.0)
Monocytes Relative: 3 % (ref 3–12)
Neutro Abs: 6.7 10*3/uL (ref 1.7–7.7)
Neutrophils Relative %: 78 % — ABNORMAL HIGH (ref 43–77)
Platelets: 287 10*3/uL (ref 150–400)
RBC: 5.37 MIL/uL — ABNORMAL HIGH (ref 3.87–5.11)
RDW: 15.3 % (ref 11.5–15.5)
WBC: 8.7 10*3/uL (ref 4.0–10.5)

## 2014-10-23 LAB — URINALYSIS, ROUTINE W REFLEX MICROSCOPIC
Bilirubin Urine: NEGATIVE
Glucose, UA: NEGATIVE mg/dL
KETONES UR: NEGATIVE mg/dL
Nitrite: NEGATIVE
PH: 5.5 (ref 5.0–8.0)
Protein, ur: 30 mg/dL — AB
SPECIFIC GRAVITY, URINE: 1.036 — AB (ref 1.005–1.030)
Urobilinogen, UA: 0.2 mg/dL (ref 0.0–1.0)

## 2014-10-23 LAB — I-STAT TROPONIN, ED: Troponin i, poc: 0 ng/mL (ref 0.00–0.08)

## 2014-10-23 LAB — POC OCCULT BLOOD, ED: FECAL OCCULT BLD: POSITIVE — AB

## 2014-10-23 LAB — POC URINE PREG, ED: Preg Test, Ur: NEGATIVE

## 2014-10-23 LAB — CK: Total CK: 21 U/L (ref 7–177)

## 2014-10-23 LAB — TSH: TSH: 0.832 u[IU]/mL (ref 0.350–4.500)

## 2014-10-23 MED ORDER — SODIUM CHLORIDE 0.9 % IV BOLUS (SEPSIS)
500.0000 mL | Freq: Once | INTRAVENOUS | Status: AC
  Administered 2014-10-23: 500 mL via INTRAVENOUS

## 2014-10-23 MED ORDER — ACETAMINOPHEN 325 MG PO TABS: 325.0000 mg | ORAL_TABLET | Freq: Four times a day (QID) | ORAL | Status: DC | PRN

## 2014-10-23 MED ORDER — HYDROXYCHLOROQUINE SULFATE 200 MG PO TABS
200.0000 mg | ORAL_TABLET | Freq: Two times a day (BID) | ORAL | Status: DC
  Administered 2014-10-23 – 2014-10-26 (×6): 200 mg via ORAL
  Filled 2014-10-23 (×6): qty 1

## 2014-10-23 MED ORDER — ACETAMINOPHEN 650 MG RE SUPP: 650.0000 mg | Freq: Four times a day (QID) | RECTAL | Status: DC | PRN

## 2014-10-23 MED ORDER — PREDNISONE 10 MG PO TABS: 10.0000 mg | ORAL_TABLET | Freq: Every day | ORAL | Status: DC

## 2014-10-23 MED ORDER — MORPHINE SULFATE 2 MG/ML IJ SOLN
2.0000 mg | Freq: Once | INTRAMUSCULAR | Status: AC
  Administered 2014-10-23: 2 mg via INTRAVENOUS
  Filled 2014-10-23: qty 1

## 2014-10-23 MED ORDER — OXYCODONE-ACETAMINOPHEN 5-325 MG PO TABS
1.0000 | ORAL_TABLET | Freq: Four times a day (QID) | ORAL | Status: DC | PRN
  Administered 2014-10-23 – 2014-10-24 (×2): 1 via ORAL
  Filled 2014-10-23 (×2): qty 1

## 2014-10-23 MED ORDER — ALBUTEROL SULFATE (2.5 MG/3ML) 0.083% IN NEBU
5.0000 mg | INHALATION_SOLUTION | Freq: Once | RESPIRATORY_TRACT | Status: AC
  Administered 2014-10-23: 5 mg via RESPIRATORY_TRACT
  Filled 2014-10-23: qty 6

## 2014-10-23 MED ORDER — NYSTATIN 100000 UNIT/ML MT SUSP
5.0000 mL | Freq: Four times a day (QID) | OROMUCOSAL | Status: DC
Start: 2014-10-23 — End: 2014-10-26
  Administered 2014-10-23 – 2014-10-26 (×11): 500000 [IU] via ORAL
  Filled 2014-10-23 (×11): qty 5

## 2014-10-23 MED ORDER — HYDROMORPHONE HCL 1 MG/ML IJ SOLN
1.0000 mg | Freq: Once | INTRAMUSCULAR | Status: AC
  Administered 2014-10-23: 1 mg via INTRAVENOUS
  Filled 2014-10-23: qty 1

## 2014-10-23 MED ORDER — MOMETASONE FURO-FORMOTEROL FUM 100-5 MCG/ACT IN AERO
1.0000 | INHALATION_SPRAY | Freq: Two times a day (BID) | RESPIRATORY_TRACT | Status: DC
  Administered 2014-10-24 – 2014-10-26 (×5): 1 via RESPIRATORY_TRACT
  Filled 2014-10-23: qty 8.8

## 2014-10-23 MED ORDER — ACETAMINOPHEN 325 MG PO TABS
650.0000 mg | ORAL_TABLET | Freq: Four times a day (QID) | ORAL | Status: DC | PRN
  Administered 2014-10-23: 650 mg via ORAL
  Filled 2014-10-23: qty 2

## 2014-10-23 MED ORDER — HYDROCERIN EX CREA
TOPICAL_CREAM | Freq: Two times a day (BID) | CUTANEOUS | Status: DC
Start: 2014-10-23 — End: 2014-10-26
  Administered 2014-10-23 – 2014-10-26 (×6): via TOPICAL
  Filled 2014-10-23: qty 113

## 2014-10-23 MED ORDER — AZATHIOPRINE 50 MG PO TABS
250.0000 mg | ORAL_TABLET | Freq: Every day | ORAL | Status: DC
  Administered 2014-10-23 – 2014-10-26 (×4): 250 mg via ORAL
  Filled 2014-10-23 (×5): qty 5

## 2014-10-23 MED ORDER — TRAMADOL HCL 50 MG PO TABS
50.0000 mg | ORAL_TABLET | Freq: Four times a day (QID) | ORAL | Status: DC | PRN
  Administered 2014-10-23: 50 mg via ORAL
  Filled 2014-10-23: qty 1

## 2014-10-23 MED ORDER — ALBUTEROL SULFATE (2.5 MG/3ML) 0.083% IN NEBU
3.0000 mL | INHALATION_SOLUTION | RESPIRATORY_TRACT | Status: DC | PRN
  Administered 2014-10-24: 3 mL via RESPIRATORY_TRACT
  Filled 2014-10-23: qty 3

## 2014-10-23 MED ORDER — ALBUTEROL SULFATE (2.5 MG/3ML) 0.083% IN NEBU
2.5000 mg | INHALATION_SOLUTION | Freq: Once | RESPIRATORY_TRACT | Status: AC
  Administered 2014-10-23: 2.5 mg via RESPIRATORY_TRACT
  Filled 2014-10-23: qty 3

## 2014-10-23 MED ORDER — RIVAROXABAN 20 MG PO TABS
20.0000 mg | ORAL_TABLET | Freq: Every day | ORAL | Status: DC
  Administered 2014-10-24 – 2014-10-25 (×2): 20 mg via ORAL
  Filled 2014-10-23 (×2): qty 1

## 2014-10-23 MED ORDER — MORPHINE SULFATE 4 MG/ML IJ SOLN: 4.0000 mg | Freq: Once | INTRAMUSCULAR | Status: DC

## 2014-10-23 NOTE — ED Notes (Signed)
Patient transported to X-ray 

## 2014-10-23 NOTE — ED Notes (Addendum)
Pt reports "bone pain" in her whole body. Started 3 days ago, states yesterday had high fever. Hx of lupus, states feels similar to flare ups of lupus.

## 2014-10-23 NOTE — H&P (Signed)
Date: 10/23/2014               Patient Name:  Donna Hall MRN: [id number]  DOB: [birth year] Age / Sex: 29 y.o., female   PCP: Dessa Phi, MD         Medical Service: Internal Medicine Teaching Service         Attending Physician: Dr. Inez Catalina, MD    First Contact: Dr. Tasia Catchings Pager: [phone]  Second Contact: Dr. Mikey Bussing Pager: [phone]       After Hours (After 5p/  First Contact Pager: [phone]  weekends / holidays): Second Contact Pager: [phone]   Chief Complaint: generalized body pain, headache  History of Present Illness:   29 yo female with hx of asthma, PE on xarelto, chronic pain, lupus, RA, psoriasis, interstitial lung disease on 2L home o2, depression here with generalized body pain, headache and some subjective fever. These started 3 days ago. Son may have had some respiratory symptoms. Patient has been out of her imuran and tramadol. She was having generalized myalgia and also arthralgia which feels like her lupus flare. Having bifrontal headache. She has been compliant with all of her other meds, including her xarelto.   Has been noticing small amount of BRBPR for last 2 days when she wipes with toilet paper. It's non painful but does burn when she takes shower, feels that sometimes she wipes too hard with toilet paper and causes the bleeding. Has psoriasis so she thinks with some burning sensation with stools passing. Denies any ab pain, constipation, melena, hemoptysis. Denies any dysuria, polyuria, or blood in the urine. Her period is coming up in few days.    Meds: Current Facility-Administered Medications  Medication Dose Route Frequency Provider Last Rate Last Dose  . acetaminophen (TYLENOL) tablet 650 mg  650 mg Oral Q6H PRN Baltazar Apo, MD       Or  . acetaminophen (TYLENOL) suppository 650 mg  650 mg Rectal Q6H PRN Baltazar Apo, MD      . albuterol (PROVENTIL) (2.5 MG/3ML) 0.083% nebulizer solution 3 mL  3 mL Inhalation Q4H PRN Baltazar Apo, MD      . azaTHIOprine (IMURAN) tablet 250 mg  250 mg Oral Daily Baltazar Apo, MD      . hydrocerin (EUCERIN) cream   Topical BID Hyacinth Meeker, MD      . hydroxychloroquine (PLAQUENIL) tablet 200 mg  200 mg Oral BID Baltazar Apo, MD      . mometasone-formoterol (DULERA) 100-5 MCG/ACT inhaler 1 puff  1 puff Inhalation BID Baltazar Apo, MD      . nystatin (MYCOSTATIN) 100000 UNIT/ML suspension 500,000 Units  5 mL Oral QID Tyshauna Finkbiner, MD      . Melene Muller ON 10/24/2014] predniSONE (DELTASONE) tablet 10 mg  10 mg Oral Q breakfast Baltazar Apo, MD      . Melene Muller ON 10/24/2014] rivaroxaban (XARELTO) tablet 20 mg  20 mg Oral Q supper Baltazar Apo, MD      . traMADol Janean Sark) tablet 50 mg  50 mg Oral Q6H PRN Baltazar Apo, MD   50 mg at 10/23/14 1553    Allergies: Allergies as of 10/23/2014  . (No Known Allergies)   Past Medical History  Diagnosis Date  . Psoriasis 2010    as a child  . Chronic pain disorder   . Obesity   . Chronic steroid use 2010  . Lupus 2000  . Lupus (systemic  lupus erythematosus) 2010  . Rheumatoid arthritis(714.0) 2010  . Hypertension 2010  . Pneumonia "several times"  . Interstitial lung disease     /notes 05/15/2014  . On home oxygen therapy     "2L; 24/7" (05/15/2014)  . Pulmonary embolism     hx/notes 05/15/2014  . History of blood transfusion     "because white cells ate the red cells"  . Headache     "just when I have the pain from aching bones and psorasis and/or lupus symptoms" (05/15/2104)  . 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 Asthma   . Gestational diabetes 2006  . Depression    Past Surgical History  Procedure Laterality Date  . Thigh / knee soft tissue biopsy Right 2011    thigh  . Cesarean section  2010   Family History  Problem Relation Age of Onset  . Diabetes Mother   . Hypertension Mother   . Cancer Neg Hx   . Early death Neg Hx   . Heart  disease Neg Hx    History   Social History  . Marital Status: Single    Spouse Name: N/A  . Number of Children: 3  . Years of Education: 1   Occupational History  . Unemployed     Social History Main Topics  . Smoking status: Never Smoker   . Smokeless tobacco: Never Used  . Alcohol Use: No  . Drug Use: No  . Sexual Activity: Not Currently    Birth Control/ Protection: Injection   Other Topics Concern  . Not on file   Social History Narrative   Patient lives with husband and 3 children (6 in [place], 49 and 4) in [place]. She migrated form [place] in 2005. She does not work. Has completed the first grade. Cannot read and write.     Review of Systems: Review of Systems  Constitutional: Positive for fever and chills. Negative for weight loss and diaphoresis.  HENT: Negative for congestion and sore throat.   Eyes: Negative for double vision, photophobia and pain.  Respiratory: Negative for cough, hemoptysis, shortness of breath, wheezing and stridor.   Cardiovascular: Negative for chest pain, palpitations, claudication and leg swelling.  Gastrointestinal: Negative for nausea, vomiting and diarrhea.  Genitourinary: Negative for dysuria, urgency, hematuria and flank pain.  Musculoskeletal: Positive for myalgias and joint pain. Negative for back pain, falls and neck pain.  Skin: Negative.   Neurological: Positive for headaches. Negative for dizziness, tingling, seizures, loss of consciousness and weakness.  Psychiatric/Behavioral: Negative for depression and suicidal ideas.     Physical Exam: Blood pressure 111/66, pulse 93, temperature 98.8 F (37.1 C), temperature source Oral, resp. rate 16, last menstrual period 10/06/2014, SpO2 97 %. Physical Exam  Constitutional: She is oriented to person, place, and time. She appears well-developed and well-nourished. No distress.  Obese female.  HENT:  Head: Normocephalic and atraumatic.  Right Ear: External ear normal.   Left Ear: External ear normal.  Has oral thrush.   Eyes: Conjunctivae are normal. Pupils are equal, round, and reactive to light. Right eye exhibits no discharge. Left eye exhibits no discharge.  Neck: Normal range of motion. No JVD present.  Cardiovascular: Normal rate and regular rhythm.  Exam reveals no gallop and no friction rub.   No murmur heard. Respiratory: Effort normal and breath sounds normal. She has no wheezes. She has no rales.  "velcro" crackles mid to  lower lung.   GI: Soft. She exhibits no distension. There is no tenderness. There is no guarding.  Genitourinary:  Has anal condyloma and also one external hemorrhoid. No visible bleeding currently.   Musculoskeletal: Normal range of motion.  Trace edema. Has TTP on right knee. No warmth or erythema.   Lymphadenopathy:    She has no cervical adenopathy.  Neurological: She is alert and oriented to person, place, and time. No cranial nerve deficit.  Skin: She is not diaphoretic.  Dry skin, has diffuse psoriasis lesions.      Lab results: Basic Metabolic Panel:  Recent Labs  19/14/78 0817  NA 140  K 3.6  CL 105  CO2 28  GLUCOSE 103*  BUN 12  CREATININE 0.52  CALCIUM 8.4   Liver Function Tests:  Recent Labs  10/23/14 0817  AST 24  ALT 17  ALKPHOS 67  BILITOT 0.6  PROT 6.3  ALBUMIN 3.3*   No results for input(s): LIPASE, AMYLASE in the last 72 hours. No results for input(s): AMMONIA in the last 72 hours. CBC:  Recent Labs  10/23/14 0817  WBC 8.7  NEUTROABS 6.7  HGB 14.2  HCT 46.2*  MCV 86.0  PLT 287   Urine Drug Screen: Drugs of Abuse     Component Value Date/Time   LABOPIA POSITIVE* 03/07/2012 2354   COCAINSCRNUR NONE DETECTED 03/07/2012 2354   LABBENZ NONE DETECTED 03/07/2012 2354   AMPHETMU NONE DETECTED 03/07/2012 2354   THCU NONE DETECTED 03/07/2012 2354   LABBARB NONE DETECTED 03/07/2012 2354    Alcohol Level: No results for input(s): ETH in the last 72  hours. Urinalysis:  Recent Labs  10/23/14 0849  COLORURINE AMBER*  LABSPEC 1.036*  PHURINE 5.5  GLUCOSEU NEGATIVE  HGBUR LARGE*  BILIRUBINUR NEGATIVE  KETONESUR NEGATIVE  PROTEINUR 30*  UROBILINOGEN 0.2  NITRITE NEGATIVE  LEUKOCYTESUR MODERATE*   Misc. Labs:  Imaging results:  Dg Chest 2 View  10/23/2014   CLINICAL DATA:  Generalized body aches for 3 days. Shortness of breath  EXAM: CHEST  2 VIEW  COMPARISON:  07/28/2014 CT. Plain films 09/11/2014 and multiple 2015 films, dating back to 08/14/2013.  FINDINGS: Low lung volumes. Patchy lung opacities in the left lung, stable to prior study and studies dating back to early 2015 compatible with chronic lung disease. No acute airspace opacities or effusions. No acute bony abnormality. Heart is normal size. Mediastinal contours are within normal limits.  IMPRESSION: Chronic lung changes. Hypoventilation. No acute cardiopulmonary disease.   Electronically Signed   By: Charlett Nose M.D.   On: 10/23/2014 09:38    Other results: EKG: NSR, ?incomplete right BBB, no changes.  Assessment & Plan by Problem: Active Problems:   Chest pain   Total body pain  29 yo female asthma, interstitial lung disease, lupus, RA here with diffuse uncontrolled myalgia likely 2/2 to lupus or RA flare.    Diffuse body pain and headache- uncontrolled likely 2/2 to Lupus or RA flare in the setting of not taking her Imuran. Could also be a viral infection with her subjective fever since she had sick contacts.  - will check influenza, will treat symptomatically for viral infection. - on plaquenil 200mg  BID, prednisone 10mg  daily, imuran 250 mg daily. Cont these.  - tramadol (home med) did not help with pain. Now on percocet q4hr prn.  BRBPR - FOBT +. hgb 14.2.  Likely 2/2 to hemorrhoid or harsh wiping or condyloma skin breakdown No melena, no hemoptysis, no ab  pain.  hgb is stable. Continue to monitor for now. Continue xarelto.  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   Treatment would be Trichloroacetic acid application - this can be done outpatient, also can use topical flouorouracil. - ordered anal cytology.  - she is immunocompromised, so will not do vaccination against HPV.  - check HIV  Hx of PE on 10/2013 - unprovoked per documentation - on xarelto. - guideline states 1 year then re-eval. She has been on anticoag almost 1 year now, CT angio multiple times recently has been negative for PE including 07/2014 and 02/2014.  - BRBPR is very mild so we will continue the xarelto for now.   Microscopic hematuria - no dysuria or other urinary symptoms - sample may have been contaminated, bleeding may be from rectum - will not treat for UTI without any symptoms.  - checking CK.  Oral thrush - not removable by wiping. - nystatin solution   Asthma - cont albuterol, dulera  Thyroid nodule - 2 cm on left thyroid gland, seen on 07/2014 CT angio also previously.  - will check TSH. May consider FNA since 2 cm in size.   Code: full On xarelto   Dispo: Disposition is deferred at this time, awaiting improvement of current medical problems. Anticipated discharge in approximately 1-2 day(s).   The patient does have a current PCP (Dessa Phi, MD) and does need an [facility] hospital follow-up appointment after discharge.  The patient does have transportation limitations that hinder transportation to clinic appointments.  Signed: Hyacinth Meeker, MD 10/23/2014, 4:51 PM

## 2014-10-23 NOTE — ED Provider Notes (Signed)
CSN: [id number]     Arrival date & time 10/23/14  0740 History   First MD Initiated Contact with Patient 10/23/14 [phone]     Chief Complaint  Patient presents with  . Generalized Body Aches     (Consider location/radiation/quality/duration/timing/severity/associated sxs/prior Treatment) The history is provided by the patient. No language interpreter was used.  Donna Hall is a 29 year old female with past medical history of psoriasis, chronic pain disorder, obesity, lupus, rheumatoid arthritis, PE currently on Xarelto, pneumonia, depression, hypertension presenting to the ED with 3 days of pain all over-"pain in bones." Patient reported that when she woke up this morning she had pain all over her body and was unable to move secondary to the pain. Patient reported that she's been having cough-productive of yellowish phlegm. Stated that she has been having shortness of breath-patient has history of asthma and has been using albuterol inhaler as needed. Patient currently on nasal cannula, when asked patient reports that she is on oxygen therapy also time. Reported that she had a subjective fever yesterday and was using Tylenol for fever control. Patient also reported that she has been noticing bleeding from her rectum for approximately 2 days-reported that it is bright red blood. Stated that she has a burning sensation with stools passing. Reported that the blood is on the toilet paper when she wipes, not all over the toilet bowl. Denied nasal congestion, hemoptysis, ear pain, eye pain, abnormal vaginal bleeding, vaginal discharge, pelvic pain, constipation, diarrhea, abdominal pain, nausea, vomiting, sick contacts, travel. PCP Dr. Armen Pickup   Past Medical History  Diagnosis Date  . Psoriasis 2010    as a child  . Chronic pain disorder   . Obesity   . Chronic steroid use 2010  . Lupus 2000  . Lupus (systemic lupus erythematosus) 2010  . Rheumatoid arthritis(714.0) 2010  . Hypertension 2010   . Pneumonia "several times"  . Interstitial lung disease     /notes 05/15/2014  . On home oxygen therapy     "2L; 24/7" (05/15/2014)  . Pulmonary embolism     hx/notes 05/15/2014  . History of blood transfusion     "because white cells ate the red cells"  . Headache     "just when I have the pain from aching bones and psorasis and/or lupus symptoms" (05/15/2104)  . 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 Asthma   . Gestational diabetes 2006  . Depression    Past Surgical History  Procedure Laterality Date  . Thigh / knee soft tissue biopsy Right 2011    thigh  . Cesarean section  2010   Family History  Problem Relation Age of Onset  . Diabetes Mother   . Hypertension Mother   . Cancer Neg Hx   . Early death Neg Hx   . Heart disease Neg Hx    History  Substance Use Topics  . Smoking status: Never Smoker   . Smokeless tobacco: Never Used  . Alcohol Use: No   OB History    No data available     Review of Systems  Constitutional: Positive for fever and chills.  Eyes: Negative for visual disturbance.  Respiratory: Positive for shortness of breath. Negative for chest tightness.   Cardiovascular: Positive for chest pain.  Gastrointestinal: Positive for blood in stool. Negative for nausea, vomiting, abdominal pain, diarrhea, constipation, anal bleeding and rectal pain.  Musculoskeletal: Positive for myalgias, arthralgias and  neck pain. Negative for back pain.  Neurological: Positive for dizziness and weakness. Negative for numbness and headaches.      Allergies  Review of patient's allergies indicates no known allergies.  Home Medications   Prior to Admission medications   Medication Sig Start Date End Date Taking? Authorizing Provider  acetaminophen (TYLENOL) 325 MG tablet Take 650 mg by mouth every 6 (six) hours as needed for mild pain or moderate pain.   Yes Historical Provider, MD  albuterol  (PROVENTIL HFA;VENTOLIN HFA) 108 (90 BASE) MCG/ACT inhaler Inhale 2 puffs into the lungs every 4 (four) hours as needed for wheezing or shortness of breath. 08/05/14  Yes Nyoka Cowden, MD  azaTHIOprine (IMURAN) 50 MG tablet Take 5 tablets (250 mg total) by mouth daily. 06/29/14  Yes Josalyn Funches, MD  cephALEXin (KEFLEX) 500 MG capsule Take 1 capsule (500 mg total) by mouth 4 (four) times daily. 10/13/14  Yes Oswaldo Conroy, PA-C  diphenhydrAMINE (BENADRYL) 2 % cream Apply topically 3 (three) times daily as needed for itching. 09/18/14  Yes Dessa Phi, MD  hydroxychloroquine (PLAQUENIL) 200 MG tablet Take 1 tablet (200 mg total) by mouth 2 (two) times daily. 09/18/14  Yes Dessa Phi, MD  hydrOXYzine (ATARAX/VISTARIL) 10 MG tablet Take 1 tablet (10 mg total) by mouth every 8 (eight) hours as needed for itching. 09/18/14  Yes Josalyn Funches, MD  mometasone-formoterol (DULERA) 100-5 MCG/ACT AERO Take 2 puffs first thing in am and then another 2 puffs about 12 hours later. Patient taking differently: Inhale 1 puff into the lungs 2 (two) times daily.  07/22/14  Yes Nyoka Cowden, MD  predniSONE (DELTASONE) 10 MG tablet Take 1 tablet (10 mg total) by mouth daily with breakfast. 09/18/14  Yes Dessa Phi, MD  rivaroxaban (XARELTO) 20 MG TABS tablet Take 1 tablet (20 mg total) by mouth daily with supper. 04/02/14  Yes Josalyn Funches, MD  traMADol (ULTRAM) 50 MG tablet Take 1 tablet (50 mg total) by mouth every 6 (six) hours as needed. 10/13/14  Yes Oswaldo Conroy, PA-C   BP 132/72 mmHg  Pulse 103  Temp(Src) 97.6 F (36.4 C) (Rectal)  Resp 19  SpO2 96%  LMP 10/06/2014 Physical Exam  Constitutional: She is oriented to person, place, and time. She appears well-developed and well-nourished. No distress.  HENT:  Head: Normocephalic and atraumatic.  Mouth/Throat: Oropharynx is clear and moist. No oropharyngeal exudate.  Eyes: Conjunctivae and EOM are normal. Pupils are equal, round, and  reactive to light. Right eye exhibits no discharge. Left eye exhibits no discharge.  Neck: Normal range of motion. Neck supple. No tracheal deviation present.  Cardiovascular: Normal rate, regular rhythm and normal heart sounds.  Exam reveals no friction rub.   No murmur heard. Pulses:      Radial pulses are 2+ on the right side, and 2+ on the left side.       Dorsalis pedis pulses are 2+ on the right side, and 2+ on the left side.  Pulmonary/Chest: Effort normal and breath sounds normal. No respiratory distress. She has no wheezes. She has no rales.  Patient is able to speak in full sentences without difficulty Negative use of accessory muscles Negative stridor  Mild wheezes upon auscultation to lower lobes bilaterally  Abdominal: Soft. Bowel sounds are normal. She exhibits no distension. There is no tenderness. There is no rebound and no guarding.  Obese  Genitourinary:  Rectal exam: Condylomata identified surrounding the anus. Bright red blood identified. Swelling and rough  palpation of masses noted to the rectum. Strong sphincter tone. Bright red blood on glove. Exam chaperoned with nurse, Molli Hazard  Musculoskeletal: Normal range of motion. She exhibits tenderness.  Negative swelling, erythema, inflammation, lesions, sores, mass, ecchymosis identified to the body. Tenderness upon palpation to the entire body.  Full ROM to upper and lower extremities without difficulty noted, negative ataxia noted.  Lymphadenopathy:    She has no cervical adenopathy.  Neurological: She is alert and oriented to person, place, and time. No cranial nerve deficit. She exhibits normal muscle tone. Coordination normal.  Skin: Skin is warm and dry. No rash noted. She is not diaphoretic. No erythema.  Psychiatric: She has a normal mood and affect. Her behavior is normal. Thought content normal.  Nursing note and vitals reviewed.   ED Course  Procedures (including critical care time)  Results for orders  placed or performed during the hospital encounter of 10/23/14  CBC with Differential/Platelet  Result Value Ref Range   WBC 8.7 4.0 - 10.5 K/uL   RBC 5.37 (H) 3.87 - 5.11 MIL/uL   Hemoglobin 14.2 12.0 - 15.0 g/dL   HCT 23.5 (H) 36.1 - 44.3 %   MCV 86.0 78.0 - 100.0 fL   MCH 26.4 26.0 - 34.0 pg   MCHC 30.7 30.0 - 36.0 g/dL   RDW 15.4 00.8 - 67.6 %   Platelets 287 150 - 400 K/uL   Neutrophils Relative % 78 (H) 43 - 77 %   Neutro Abs 6.7 1.7 - 7.7 K/uL   Lymphocytes Relative 17 12 - 46 %   Lymphs Abs 1.5 0.7 - 4.0 K/uL   Monocytes Relative 3 3 - 12 %   Monocytes Absolute 0.3 0.1 - 1.0 K/uL   Eosinophils Relative 2 0 - 5 %   Eosinophils Absolute 0.2 0.0 - 0.7 K/uL   Basophils Relative 0 0 - 1 %   Basophils Absolute 0.0 0.0 - 0.1 K/uL  Comprehensive metabolic panel  Result Value Ref Range   Sodium 140 135 - 145 mmol/L   Potassium 3.6 3.5 - 5.1 mmol/L   Chloride 105 96 - 112 mmol/L   CO2 28 19 - 32 mmol/L   Glucose, Bld 103 (H) 70 - 99 mg/dL   BUN 12 6 - 23 mg/dL   Creatinine, Ser 1.95 0.50 - 1.10 mg/dL   Calcium 8.4 8.4 - 09.3 mg/dL   Total Protein 6.3 6.0 - 8.3 g/dL   Albumin 3.3 (L) 3.5 - 5.2 g/dL   AST 24 0 - 37 U/L   ALT 17 0 - 35 U/L   Alkaline Phosphatase 67 39 - 117 U/L   Total Bilirubin 0.6 0.3 - 1.2 mg/dL   GFR calc non Af Amer >90 >90 mL/min   GFR calc Af Amer >90 >90 mL/min   Anion gap 7 5 - 15  Urinalysis, Routine w reflex microscopic  Result Value Ref Range   Color, Urine AMBER (A) YELLOW   APPearance CLOUDY (A) CLEAR   Specific Gravity, Urine 1.036 (H) 1.005 - 1.030   pH 5.5 5.0 - 8.0   Glucose, UA NEGATIVE NEGATIVE mg/dL   Hgb urine dipstick LARGE (A) NEGATIVE   Bilirubin Urine NEGATIVE NEGATIVE   Ketones, ur NEGATIVE NEGATIVE mg/dL   Protein, ur 30 (A) NEGATIVE mg/dL   Urobilinogen, UA 0.2 0.0 - 1.0 mg/dL   Nitrite NEGATIVE NEGATIVE   Leukocytes, UA MODERATE (A) NEGATIVE  Urine microscopic-add on  Result Value Ref Range   Squamous Epithelial /  LPF  MANY (A) RARE   WBC, UA 21-50 <3 WBC/hpf   RBC / HPF 3-6 <3 RBC/hpf   Bacteria, UA MANY (A) RARE   Urine-Other MUCOUS PRESENT   POC urine preg, ED (not at [facility])  Result Value Ref Range   Preg Test, Ur NEGATIVE NEGATIVE  POC occult blood, ED  Result Value Ref Range   Fecal Occult Bld POSITIVE (A) NEGATIVE  I-Stat Troponin, ED (not at [facility])  Result Value Ref Range   Troponin i, poc 0.00 0.00 - 0.08 ng/mL   Comment 3            Labs Review Labs Reviewed  CBC WITH DIFFERENTIAL/PLATELET - Abnormal; Notable for the following:    RBC 5.37 (*)    HCT 46.2 (*)    Neutrophils Relative % 78 (*)    All other components within normal limits  COMPREHENSIVE METABOLIC PANEL - Abnormal; Notable for the following:    Glucose, Bld 103 (*)    Albumin 3.3 (*)    All other components within normal limits  URINALYSIS, ROUTINE W REFLEX MICROSCOPIC - Abnormal; Notable for the following:    Color, Urine AMBER (*)    APPearance CLOUDY (*)    Specific Gravity, Urine 1.036 (*)    Hgb urine dipstick LARGE (*)    Protein, ur 30 (*)    Leukocytes, UA MODERATE (*)    All other components within normal limits  URINE MICROSCOPIC-ADD ON - Abnormal; Notable for the following:    Squamous Epithelial / LPF MANY (*)    Bacteria, UA MANY (*)    All other components within normal limits  POC OCCULT BLOOD, ED - Abnormal; Notable for the following:    Fecal Occult Bld POSITIVE (*)    All other components within normal limits  URINE CULTURE  OCCULT BLOOD X 1 CARD TO LAB, STOOL  POC URINE PREG, ED  Rosezena Sensor, ED    Imaging Review Dg Chest 2 View  10/23/2014   CLINICAL DATA:  Generalized body aches for 3 days. Shortness of breath  EXAM: CHEST  2 VIEW  COMPARISON:  07/28/2014 CT. Plain films 09/11/2014 and multiple 2015 films, dating back to 08/14/2013.  FINDINGS: Low lung volumes. Patchy lung opacities in the left lung, stable to prior study and studies dating back to early 2015 compatible with chronic lung  disease. No acute airspace opacities or effusions. No acute bony abnormality. Heart is normal size. Mediastinal contours are within normal limits.  IMPRESSION: Chronic lung changes. Hypoventilation. No acute cardiopulmonary disease.   Electronically Signed   By: Charlett Nose M.D.   On: 10/23/2014 09:38     EKG Interpretation   Date/Time:  Friday October 23 2014 09:11:57 EDT Ventricular Rate:  96 PR Interval:  126 QRS Duration: 86 QT Interval:  364 QTC Calculation: 460 R Axis:   62 Text Interpretation:  Sinus rhythm RSR' in V1 or V2, right VCD or RVH  UNCHANGED FROM PRIOR Confirmed by Rhunette Croft, MD, Janey Genta [phone]) on 10/23/2014  9:18:14 AM      1:23 PM This provider spoke with Dr. Virgina Organ, Internal Medicine Teaching Services. Discussed case, labs, imaging, ED course in great detail. Patient to be admitted for cardiac rule out regarding chest pain. Also poor pain control in ED setting regarding lupus flareup. Patient to be admitted to telemetry floor under the care of Dr. Criselda Peaches.  MDM   Final diagnoses:  Chest pain, unspecified chest pain type  Shortness of breath  Lupus  Medications  sodium chloride 0.9 % bolus 500 mL (500 mLs Intravenous New Bag/Given 10/23/14 0946)  albuterol (PROVENTIL) (2.5 MG/3ML) 0.083% nebulizer solution 2.5 mg (2.5 mg Nebulization Given 10/23/14 1038)  morphine 2 MG/ML injection 2 mg (2 mg Intravenous Given 10/23/14 1042)  HYDROmorphone (DILAUDID) injection 1 mg (1 mg Intravenous Given 10/23/14 1225)  sodium chloride 0.9 % bolus 500 mL (500 mLs Intravenous New Bag/Given 10/23/14 1229)  albuterol (PROVENTIL) (2.5 MG/3ML) 0.083% nebulizer solution 5 mg (5 mg Nebulization Given 10/23/14 1225)    Filed Vitals:   10/23/14 1200 10/23/14 1211 10/23/14 1215 10/23/14 1230  BP: 125/63 125/63 125/76 132/72  Pulse: 101 98 94 103  Temp:      TempSrc:      Resp: 18 18 17 19   SpO2: 96% 96% 96% 96%   EKG noted normal sinus rhythm with a heart rate of 96 bpm. I-STAT  troponin negative elevation. CBC negative elevated leukocytosis. Hemoglobin 14.2, hematocrit 46.2. CMP unremarkable, glucose 103 with negative elevated anion gap. Urine pregnancy negative. Urinalysis noted large hemoglobin with negative nitrites and moderate leukocytes with white blood cell count of 21-50 with many squamous cells-appears to be a contaminated specimen, urine culture pending. Fecal occult positive. Negative findings of pneumonia noted on chest x-ray. Fecal occult positive with bright red blood noted on glove, hemoglobin 14.2-no need for transfusion at this time. Patient had expiratory wheezes on physical examination-albuterol inhaler has been administered. Patient presented to the ED with generalized pain-mostly localized to the extremities-suspicion to be lupus flare. IV pain medications administered in ED setting without relief. Patient was hypoxic upon arrival to the ED with a pulse ox of 88% on room air, patient reports that she is normally on oxygen as needed-nasal cannula placed at 2 L/m with pulse increasing to 95-96%. Patient presenting with intermittent chest pain as well. Discussed case in great detail with internal medicine teaching services. Patient to be admitted for chest pain, shortness of breath, and poor pain control of lupus flare up in ED setting. Patient afebrile, not septic appearing. Patient does have history PE, but is currently on Xarelto - doubt recurrent PE. Patient to be admitted under the care of internal medicine teaching services, under the care of Dr. . Discussed plan for admission with patient who is in accordance to plan of care. Patient stable for transfer to floor.   Criselda Peaches, PA-C 10/23/14 1330  10/25/14, MD 10/23/14 1739

## 2014-10-24 DIAGNOSIS — M069 Rheumatoid arthritis, unspecified: Secondary | ICD-10-CM | POA: Diagnosis present

## 2014-10-24 DIAGNOSIS — A630 Anogenital (venereal) warts: Secondary | ICD-10-CM

## 2014-10-24 DIAGNOSIS — E041 Nontoxic single thyroid nodule: Secondary | ICD-10-CM | POA: Diagnosis present

## 2014-10-24 DIAGNOSIS — Z7901 Long term (current) use of anticoagulants: Secondary | ICD-10-CM

## 2014-10-24 DIAGNOSIS — Z86711 Personal history of pulmonary embolism: Secondary | ICD-10-CM

## 2014-10-24 DIAGNOSIS — J849 Interstitial pulmonary disease, unspecified: Secondary | ICD-10-CM | POA: Diagnosis present

## 2014-10-24 DIAGNOSIS — Z9114 Patient's other noncompliance with medication regimen: Secondary | ICD-10-CM | POA: Diagnosis present

## 2014-10-24 DIAGNOSIS — Z7952 Long term (current) use of systemic steroids: Secondary | ICD-10-CM | POA: Diagnosis not present

## 2014-10-24 DIAGNOSIS — M329 Systemic lupus erythematosus, unspecified: Secondary | ICD-10-CM | POA: Diagnosis not present

## 2014-10-24 DIAGNOSIS — I10 Essential (primary) hypertension: Secondary | ICD-10-CM | POA: Diagnosis present

## 2014-10-24 DIAGNOSIS — L409 Psoriasis, unspecified: Secondary | ICD-10-CM | POA: Diagnosis present

## 2014-10-24 DIAGNOSIS — G8929 Other chronic pain: Secondary | ICD-10-CM | POA: Diagnosis present

## 2014-10-24 DIAGNOSIS — R0602 Shortness of breath: Secondary | ICD-10-CM | POA: Diagnosis present

## 2014-10-24 DIAGNOSIS — R312 Other microscopic hematuria: Secondary | ICD-10-CM | POA: Diagnosis present

## 2014-10-24 DIAGNOSIS — K625 Hemorrhage of anus and rectum: Secondary | ICD-10-CM

## 2014-10-24 DIAGNOSIS — J45909 Unspecified asthma, uncomplicated: Secondary | ICD-10-CM | POA: Diagnosis present

## 2014-10-24 DIAGNOSIS — B370 Candidal stomatitis: Secondary | ICD-10-CM

## 2014-10-24 DIAGNOSIS — Z630 Problems in relationship with spouse or partner: Secondary | ICD-10-CM

## 2014-10-24 DIAGNOSIS — IMO0002 Reserved for concepts with insufficient information to code with codable children: Secondary | ICD-10-CM | POA: Diagnosis present

## 2014-10-24 LAB — URINE CULTURE
Colony Count: >=100000
SPECIAL REQUESTS: NORMAL

## 2014-10-24 LAB — URINALYSIS, ROUTINE W REFLEX MICROSCOPIC
Bilirubin Urine: NEGATIVE
Glucose, UA: NEGATIVE mg/dL
Ketones, ur: NEGATIVE mg/dL
NITRITE: NEGATIVE
PROTEIN: NEGATIVE mg/dL
Specific Gravity, Urine: 1.029 (ref 1.005–1.030)
UROBILINOGEN UA: 0.2 mg/dL (ref 0.0–1.0)
pH: 5 (ref 5.0–8.0)

## 2014-10-24 LAB — GLUCOSE, CAPILLARY: GLUCOSE-CAPILLARY: 90 mg/dL (ref 70–99)

## 2014-10-24 LAB — HIV ANTIBODY (ROUTINE TESTING W REFLEX): HIV SCREEN 4TH GENERATION: NONREACTIVE

## 2014-10-24 LAB — URINE MICROSCOPIC-ADD ON

## 2014-10-24 MED ORDER — PREDNISONE 20 MG PO TABS
60.0000 mg | ORAL_TABLET | Freq: Every day | ORAL | Status: DC
  Administered 2014-10-24 – 2014-10-26 (×3): 60 mg via ORAL
  Filled 2014-10-24 (×3): qty 3

## 2014-10-24 MED ORDER — HYDROMORPHONE HCL 1 MG/ML IJ SOLN
0.5000 mg | INTRAMUSCULAR | Status: DC | PRN
  Administered 2014-10-24 – 2014-10-26 (×12): 1 mg via INTRAVENOUS
  Filled 2014-10-24 (×12): qty 1

## 2014-10-24 MED ORDER — PREDNISONE 20 MG PO TABS: 60.0000 mg | ORAL_TABLET | Freq: Every day | ORAL | Status: DC

## 2014-10-24 MED ORDER — OXYCODONE-ACETAMINOPHEN 5-325 MG PO TABS
1.0000 | ORAL_TABLET | Freq: Once | ORAL | Status: AC
  Administered 2014-10-24: 1 via ORAL
  Filled 2014-10-24: qty 1

## 2014-10-24 MED ORDER — OXYCODONE-ACETAMINOPHEN 5-325 MG PO TABS
1.0000 | ORAL_TABLET | ORAL | Status: DC | PRN
  Administered 2014-10-24 – 2014-10-26 (×8): 1 via ORAL
  Filled 2014-10-24 (×8): qty 1

## 2014-10-24 NOTE — Progress Notes (Signed)
Subjective: Continues to be in significant body pain, especially on her hands and leg, also has the headache. No sob/cp/n/v/fever/chills. Pain meds helped somewhat but did not last long enough. No more bleeding per rectum, no dysuria.  Objective: Vital signs in last 24 hours: Filed Vitals:   10/23/14 1425 10/23/14 1939 10/24/14 0518 10/24/14 0822  BP: 111/66 124/60 116/48   Pulse: 93 96 90   Temp: 98.8 F (37.1 C)  96.6 F (35.9 C)   TempSrc: Oral  Oral   Resp: 16 16 22    Height:   5\' 3"  (1.6 m)   Weight:   304 lb 4.8 oz (138.03 kg)   SpO2: 97% 95% 97% 98%   Weight change:   Intake/Output Summary (Last 24 hours) at 10/24/14 1015 Last data filed at 10/24/14 0938  Gross per 24 hour  Intake   1220 ml  Output      0 ml  Net   1220 ml   Vitals reviewed. General: resting in bed, in pain HEENT: PERRL, EOMI, no scleral icterus Cardiac: tachycardic, no rubs, murmurs or gallops Pulm: some mild rales on the bases.  Abd: obese, non tender Ext: TTP on right knee with some warmth, no erythema.  Right wrist is severely tender to palpation with barely any movement. Neuro: alert and oriented X3, cranial nerves II-XII grossly intact, strength and sensation to light touch equal in bilateral upper and lower extremities  Lab Results: Basic Metabolic Panel:  Recent Labs Lab 10/23/14 0817  NA 140  K 3.6  CL 105  CO2 28  GLUCOSE 103*  BUN 12  CREATININE 0.52  CALCIUM 8.4   Liver Function Tests:  Recent Labs Lab 10/23/14 0817  AST 24  ALT 17  ALKPHOS 67  BILITOT 0.6  PROT 6.3  ALBUMIN 3.3*   No results for input(s): LIPASE, AMYLASE in the last 168 hours. No results for input(s): AMMONIA in the last 168 hours. CBC:  Recent Labs Lab 10/23/14 0817  WBC 8.7  NEUTROABS 6.7  HGB 14.2  HCT 46.2*  MCV 86.0  PLT 287   Cardiac Enzymes:  Recent Labs Lab 10/23/14 1809  CKTOTAL 21   Thyroid Function Tests:  Recent Labs Lab 10/23/14 1934  TSH 0.832   Urine  Drug Screen: Drugs of Abuse     Component Value Date/Time   LABOPIA POSITIVE* 03/07/2012 2354   COCAINSCRNUR NONE DETECTED 03/07/2012 2354   LABBENZ NONE DETECTED 03/07/2012 2354   AMPHETMU NONE DETECTED 03/07/2012 2354   THCU NONE DETECTED 03/07/2012 2354   LABBARB NONE DETECTED 03/07/2012 2354    Alcohol Level: No results for input(s): ETH in the last 168 hours. Urinalysis:  Recent Labs Lab 10/23/14 0849  COLORURINE AMBER*  LABSPEC 1.036*  PHURINE 5.5  GLUCOSEU NEGATIVE  HGBUR LARGE*  BILIRUBINUR NEGATIVE  KETONESUR NEGATIVE  PROTEINUR 30*  UROBILINOGEN 0.2  NITRITE NEGATIVE  LEUKOCYTESUR MODERATE*   Misc. Labs:  Micro Results: No results found for this or any previous visit (from the past 240 hour(s)). Studies/Results: Dg Chest 2 View  10/23/2014   CLINICAL DATA:  Generalized body aches for 3 days. Shortness of breath  EXAM: CHEST  2 VIEW  COMPARISON:  07/28/2014 CT. Plain films 09/11/2014 and multiple 2015 films, dating back to 08/14/2013.  FINDINGS: Low lung volumes. Patchy lung opacities in the left lung, stable to prior study and studies dating back to early 2015 compatible with chronic lung disease. No acute airspace opacities or effusions. No acute bony abnormality.  Heart is normal size. Mediastinal contours are within normal limits.  IMPRESSION: Chronic lung changes. Hypoventilation. No acute cardiopulmonary disease.   Electronically Signed   By: Charlett Nose M.D.   On: 10/23/2014 09:38   Medications: I have reviewed the patient's current medications. Scheduled Meds: . azaTHIOprine  250 mg Oral Daily  . hydrocerin   Topical BID  . hydroxychloroquine  200 mg Oral BID  . mometasone-formoterol  1 puff Inhalation BID  . nystatin  5 mL Oral QID  . predniSONE  60 mg Oral Q breakfast  . rivaroxaban  20 mg Oral Q supper   Continuous Infusions:  PRN Meds:.acetaminophen, albuterol, HYDROmorphone (DILAUDID) injection,  oxyCODONE-acetaminophen Assessment/Plan: Principal Problem:   Total body pain Active Problems:   Rheumatoid arthritis   Psoriasis   Long term current use of systemic steroids   Systemic lupus erythematosus   Essential hypertension, benign   Pulmonary embolism   Headache   Condyloma acuminatum of perianal region   Rectal bleeding   Chronic anticoagulation  29 yo female with asthma, ILD, lupus, ra, psoriasis here with diffuse myalgia and arthalgia likely 2/2 to RA/lupus flare.  RA/lupus flare - likely 2/2 to not taking her medications for last few days. Presented with diffuse myalgia and arthralgia. On exam has significant TTP on her wrists and also right knee.  - will treat for RA/lupus flare with prednisone 60mg  daily  - pain control with dilaudid 0.5-1 mg q2hr + percocet q4hr prn - continue imuran 250mg  daily, plaquenil 200mg  bid, increased prednisone for flare (home dose 10mg  daily)  BRBPR - fobt +. Resolved. Likely 2/2 to external hemorrhoid or anal condyloma - no melena, no hemoptysis, no ab pain. 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  Hx of PE on 10/2013 - unprovoked per documentation - on xarelto - cont xarelto. No PE seen on 02/2014 CT angio or 07/2014.   microscopic hematuria - no dysuria, no urinary symptoms, no visible hematuria - maybe contaminated from rectal bleeding? - CK is normal - will repeat UA  Oral thrush - treating with nystatin solution  Asthma - cont albuterol, dulera  Thyroid nodule - 2 cm left thyroid gland, seen 07/2014 CT angio also previously. - TSH normal. Consider FNA outpatient.   Code: full On xarelto.  Dispo: Disposition is deferred at this time, awaiting improvement of current medical problems.  Anticipated discharge in approximately 1-2 day(s).   The patient does have a current PCP (11/2013, MD) and does need an [facility] hospital  follow-up appointment after discharge.  The patient does have transportation limitations that hinder transportation to clinic appointments.  .Services Needed at time of discharge: Y = Yes, Blank = No PT:   OT:   RN:   Equipment:   Other:       08/2014, MD 10/24/2014, 10:15 AM

## 2014-10-24 NOTE — Progress Notes (Signed)
UR completed 

## 2014-10-24 NOTE — Progress Notes (Signed)
Called on-call, prescribed Percocet 5 not seeming to tame pain of pt. Spoke to on-call, informed him of pts condition, awaiting further orders for pain meds, if any.

## 2014-10-24 NOTE — Progress Notes (Signed)
Spoke with Dr Glenard Haring. Said to access pain 10 minutes post Percocet 5 admin and if no change in pain to give a 1 time repeat Perc 5. Said attending would be rounding at 0700 and address pain mgt at that time. 2nd Perc 5 ordered and given since pt stated no slackening of pain

## 2014-10-25 LAB — BASIC METABOLIC PANEL
ANION GAP: 7 (ref 5–15)
BUN: 10 mg/dL (ref 6–20)
CALCIUM: 8.5 mg/dL — AB (ref 8.9–10.3)
CO2: 34 mmol/L — AB (ref 22–32)
Chloride: 98 mmol/L — ABNORMAL LOW (ref 101–111)
Creatinine, Ser: 0.51 mg/dL (ref 0.44–1.00)
GFR calc Af Amer: >60 mL/min (ref >60)
GFR calc non Af Amer: >60 mL/min (ref >60)
Glucose, Bld: 91 mg/dL (ref 70–99)
POTASSIUM: 4.1 mmol/L (ref 3.5–5.1)
Sodium: 139 mmol/L (ref 135–145)

## 2014-10-25 LAB — CBC WITH DIFFERENTIAL/PLATELET
BASOS PCT: 0 % (ref 0–1)
Basophils Absolute: 0 10*3/uL (ref 0.0–0.1)
EOS ABS: 0.1 10*3/uL (ref 0.0–0.7)
Eosinophils Relative: 1 % (ref 0–5)
HEMATOCRIT: 42.7 % (ref 36.0–46.0)
HEMOGLOBIN: 12.9 g/dL (ref 12.0–15.0)
LYMPHS ABS: 2 10*3/uL (ref 0.7–4.0)
Lymphocytes Relative: 25 % (ref 12–46)
MCH: 26.4 pg (ref 26.0–34.0)
MCHC: 30.2 g/dL (ref 30.0–36.0)
MCV: 87.3 fL (ref 78.0–100.0)
MONO ABS: 0.5 10*3/uL (ref 0.1–1.0)
MONOS PCT: 7 % (ref 3–12)
NEUTROS ABS: 5.3 10*3/uL (ref 1.7–7.7)
Neutrophils Relative %: 67 % (ref 43–77)
Platelets: 299 10*3/uL (ref 150–400)
RBC: 4.89 MIL/uL (ref 3.87–5.11)
RDW: 15 % (ref 11.5–15.5)
WBC: 8 10*3/uL (ref 4.0–10.5)

## 2014-10-25 LAB — RESPIRATORY VIRUS PANEL
Adenovirus: NEGATIVE
INFLUENZA A: NEGATIVE
Influenza B: NEGATIVE
Metapneumovirus: NEGATIVE
PARAINFLUENZA 3 A: NEGATIVE
Parainfluenza 1: NEGATIVE
Parainfluenza 2: NEGATIVE
Respiratory Syncytial Virus A: NEGATIVE
Respiratory Syncytial Virus B: NEGATIVE
Rhinovirus: NEGATIVE

## 2014-10-25 LAB — GLUCOSE, CAPILLARY: Glucose-Capillary: 91 mg/dL (ref 70–99)

## 2014-10-25 MED ORDER — PROMETHAZINE HCL 25 MG/ML IJ SOLN
12.5000 mg | Freq: Four times a day (QID) | INTRAMUSCULAR | Status: DC | PRN
  Administered 2014-10-25: 12.5 mg via INTRAVENOUS
  Filled 2014-10-25: qty 1

## 2014-10-25 NOTE — Progress Notes (Signed)
Spoke with Teaching Service on call about pt having nausea. Requested Zofran, am awaiting orders, if any

## 2014-10-25 NOTE — Progress Notes (Signed)
Subjective: Patient reports she is feeling overall better but still having pain needing the IV pain medication.  Objective: Vital signs in last 24 hours: Filed Vitals:   10/24/14 1631 10/24/14 2100 10/24/14 2126 10/25/14 0500  BP: 124/62 120/74  96/41  Pulse: 95 108  97  Temp: 98 F (36.7 C) 98.4 F (36.9 C)  98.2 F (36.8 C)  TempSrc: Oral     Resp: 20 20  21   Height:      Weight:    303 lb 6.4 oz (137.621 kg)  SpO2: 97% 93% 98% 98%   Weight change: -14.4 oz (-0.408 kg)  Intake/Output Summary (Last 24 hours) at 10/25/14 0856 Last data filed at 10/25/14 0500  Gross per 24 hour  Intake    840 ml  Output    400 ml  Net    440 ml   Vitals reviewed. General: resting in bed HEENT:  no scleral icterus Cardiac: RRR, no rubs, murmurs or gallops Pulm: CTAB Abd: obese, non tender Ext: joint exam on knees improved, increased ROM, wrists bilaterally also less tender, still quite tender with left 3rd PIP, otherwise overall improvement Neuro: alert and oriented X3  Lab Results: Basic Metabolic Panel:  Recent Labs Lab 10/23/14 0817 10/25/14 0515  NA 140 139  K 3.6 4.1  CL 105 98*  CO2 28 34*  GLUCOSE 103* 91  BUN 12 10  CREATININE 0.52 0.51  CALCIUM 8.4 8.5*   Liver Function Tests:  Recent Labs Lab 10/23/14 0817  AST 24  ALT 17  ALKPHOS 67  BILITOT 0.6  PROT 6.3  ALBUMIN 3.3*   No results for input(s): LIPASE, AMYLASE in the last 168 hours. No results for input(s): AMMONIA in the last 168 hours. CBC:  Recent Labs Lab 10/23/14 0817 10/25/14 0515  WBC 8.7 8.0  NEUTROABS 6.7 5.3  HGB 14.2 12.9  HCT 46.2* 42.7  MCV 86.0 87.3  PLT 287 299   Cardiac Enzymes:  Recent Labs Lab 10/23/14 1809  CKTOTAL 21   Thyroid Function Tests:  Recent Labs Lab 10/23/14 1934  TSH 0.832   Urine Drug Screen: Drugs of Abuse     Component Value Date/Time   LABOPIA POSITIVE* 03/07/2012 2354   COCAINSCRNUR NONE DETECTED 03/07/2012 2354   LABBENZ NONE DETECTED  03/07/2012 2354   AMPHETMU NONE DETECTED 03/07/2012 2354   THCU NONE DETECTED 03/07/2012 2354   LABBARB NONE DETECTED 03/07/2012 2354    Alcohol Level: No results for input(s): ETH in the last 168 hours. Urinalysis:  Recent Labs Lab 10/23/14 0849 10/24/14 1116  COLORURINE AMBER* AMBER*  LABSPEC 1.036* 1.029  PHURINE 5.5 5.0  GLUCOSEU NEGATIVE NEGATIVE  HGBUR LARGE* LARGE*  BILIRUBINUR NEGATIVE NEGATIVE  KETONESUR NEGATIVE NEGATIVE  PROTEINUR 30* NEGATIVE  UROBILINOGEN 0.2 0.2  NITRITE NEGATIVE NEGATIVE  LEUKOCYTESUR MODERATE* SMALL*   Misc. Labs:  Micro Results: Recent Results (from the past 240 hour(s))  Urine culture     Status: None   Collection Time: 10/23/14  8:49 AM  Result Value Ref Range Status   Specimen Description URINE, RANDOM  Final   Special Requests Normal  Final   Colony Count   Final    >=100,000 COLONIES/ML Performed at [facility]    Culture   Final    Multiple bacterial morphotypes present, none predominant. Suggest appropriate recollection if clinically indicated. Performed at [facility]    Report Status 10/24/2014 FINAL  Final  Respiratory virus panel     Status:  None   Collection Time: 10/23/14  4:03 PM  Result Value Ref Range Status   Respiratory Syncytial Virus A Negative Negative Final   Respiratory Syncytial Virus B Negative Negative Final   Influenza A Negative Negative Final   Influenza B Negative Negative Final   Parainfluenza 1 Negative Negative Final   Parainfluenza 2 Negative Negative Final   Parainfluenza 3 Negative Negative Final   Metapneumovirus Negative Negative Final   Rhinovirus Negative Negative Final   Adenovirus Negative Negative Final    Comment: (NOTE) Performed At: [facility] [address] [place], [place] [id number] Mila Homer MD NL:[id number]    Studies/Results: Dg Chest 2 View  10/23/2014   CLINICAL DATA:  Generalized body aches for 3 days. Shortness of breath  EXAM:  CHEST  2 VIEW  COMPARISON:  07/28/2014 CT. Plain films 09/11/2014 and multiple 2015 films, dating back to 08/14/2013.  FINDINGS: Low lung volumes. Patchy lung opacities in the left lung, stable to prior study and studies dating back to early 2015 compatible with chronic lung disease. No acute airspace opacities or effusions. No acute bony abnormality. Heart is normal size. Mediastinal contours are within normal limits.  IMPRESSION: Chronic lung changes. Hypoventilation. No acute cardiopulmonary disease.   Electronically Signed   By: Charlett Nose M.D.   On: 10/23/2014 09:38   Medications: I have reviewed the patient's current medications. Scheduled Meds: . azaTHIOprine  250 mg Oral Daily  . hydrocerin   Topical BID  . hydroxychloroquine  200 mg Oral BID  . mometasone-formoterol  1 puff Inhalation BID  . nystatin  5 mL Oral QID  . predniSONE  60 mg Oral Q breakfast  . rivaroxaban  20 mg Oral Q supper   Continuous Infusions:  PRN Meds:.acetaminophen, albuterol, HYDROmorphone (DILAUDID) injection, oxyCODONE-acetaminophen, promethazine Assessment/Plan: Principal Problem:   Rheumatoid arthritis flare Active Problems:   Rheumatoid arthritis   Psoriasis   Long term current use of systemic steroids   Systemic lupus erythematosus   Essential hypertension, benign   Pulmonary embolism   Headache   Total body pain   Condyloma acuminatum of perianal region   Rectal bleeding   Chronic anticoagulation   Lupus  29 yo female with asthma, ILD, lupus, ra, psoriasis here with diffuse myalgia and arthalgia likely 2/2 to RA/lupus flare.  RA/lupus flare - likely 2/2 to not taking her medications for last few days. Presented with diffuse myalgia and arthralgia. With polyarthritis on exam. - treat for RA/lupus flare with prednisone taper - pain control with dilaudid 0.5-1 mg q2hr + percocet q4hr prn - continue imuran 250mg  daily, plaquenil 200mg  bid, increased prednisone for flare (home dose 10mg   daily)  BRBPR - fobt +. Resolved. Likely 2/2 to external hemorrhoid or anal condyloma - no melena, no hemoptysis, no ab pain. 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  Hx of PE on 10/2013 - unprovoked per documentation - on xarelto - cont xarelto. No PE seen on 02/2014 CT angio or 07/2014.   microscopic hematuria - no dysuria, no urinary symptoms, no visible hematuria - maybe contaminated from rectal bleeding? - CK is normal  Repeat U/A again is dirty with many sq epithelial. - Will need another reapt UA may need further workup but will wait for imrpovement in other symptoms - Consider repeating U/A on 5/2   Oral thrush - treating with nystatin solution  Asthma - cont albuterol, dulera  Thyroid nodule - 2 cm left thyroid gland, seen 07/2014 CT angio also previously. - TSH normal. Needs FNA outpatient.   Code: full On xarelto.  Dispo:   Anticipated discharge in approximately 1-2 day(s). Flair not quite controlled still needing IV pain medications  The patient does have a current PCP (Dessa Phi, MD) and does need an [facility] hospital follow-up appointment after discharge.  The patient does have transportation limitations that hinder transportation to clinic appointments.  .Services Needed at time of discharge: Y = Yes, Blank = No PT:   OT:   RN:   Equipment:   Other:     LOS: 1 day   Gust Rung, DO 10/25/2014, 8:56 AM

## 2014-10-25 NOTE — Progress Notes (Signed)
Pt asked to have her but cleaned and Eucerin appllied. Small spots of blood were noted on the wet washcloth. Will report off to dayshift

## 2014-10-26 LAB — GLUCOSE, CAPILLARY: Glucose-Capillary: 96 mg/dL (ref 70–99)

## 2014-10-26 MED ORDER — NYSTATIN 100000 UNIT/ML MT SUSP: 5.0000 mL | Freq: Four times a day (QID) | OROMUCOSAL | Status: AC

## 2014-10-26 MED ORDER — HYDROXYCHLOROQUINE SULFATE 200 MG PO TABS: 200.0000 mg | ORAL_TABLET | Freq: Two times a day (BID) | ORAL | Status: DC

## 2014-10-26 MED ORDER — OXYCODONE-ACETAMINOPHEN 10-325 MG PO TABS: 1.0000 | ORAL_TABLET | ORAL | Status: DC | PRN

## 2014-10-26 MED ORDER — AZATHIOPRINE 50 MG PO TABS: 250.0000 mg | ORAL_TABLET | Freq: Every day | ORAL | Status: DC

## 2014-10-26 MED ORDER — PREDNISONE 10 MG PO TABS: 10.0000 mg | ORAL_TABLET | Freq: Every day | ORAL | Status: DC

## 2014-10-26 NOTE — Discharge Instructions (Addendum)
·   Thank you for allowing [place] to be involved in your healthcare while you were hospitalized at [facility].   Please note that there have been changes to your home medications.  --> PLEASE LOOK AT YOUR DISCHARGE MEDICATION LIST FOR DETAILS.   Please call your PCP if you have any questions or concerns, or any difficulty getting any of your medications.  Please return to the ER if you have worsening of your symptoms or new severe symptoms arise.  Make sure to keep up with your lupus medications and follow up with your rheumatologist. We have scheduled you a follow up appointment.  Taper your prednisone to 40 mg for 2 days, then 20 mg for 2 days, then resume your 10 mg every day until you follow up with rheumatology.  Your PCP will need to refer your for a fine needle aspiration of your thyroid nodule.

## 2014-10-26 NOTE — Care Management Note (Addendum)
    Page 1 of 1   10/26/2014     11:19:28 AM CARE MANAGEMENT NOTE 10/26/2014  Patient:  Donna Hall, Donna Hall   Account Number:  [id number]  Date Initiated:  10/26/2014  Documentation initiated by:  GRAVES-BIGELOW,Savio Albrecht  Subjective/Objective Assessment:   Pt admitted for Lupus/RA flare. [facility].     Action/Plan:   CM did call the Health Dept to see if pt is getting meds @ [facility]. CM is awating call back.   Anticipated DC Date:  10/26/2014   Anticipated DC Plan:  HOME/SELF CARE      DC Planning Services  CM consult  Medication Assistance  [facility]  Follow-up appt scheduled      Choice offered to / List presented to:             Status of service:  Completed, signed off Medicare Important Message given?  NO (If response is "NO", the following Medicare IM given date fields will be blank) Date Medicare IM given:   Medicare IM given by:   Date Additional Medicare IM given:   Additional Medicare IM given by:    Discharge Disposition:  HOME/SELF CARE  Per UR Regulation:  Reviewed for med. necessity/level of care/duration of stay  If discussed at Long Length of Stay Meetings, dates discussed:    Comments:  10-26-14 1110 Tomi Bamberger, RN, BSN [phone] CM did call Physician and stated pt goes to the [facility]. CM did call and appointment is available 10-30-14 @ 1130. CM also called pharmacy and pt is getting assistance via samples. Pt unable to use the [employer]or[employer] due to pt does not have a valid SS #. No further needs from CM at this time.

## 2014-10-26 NOTE — Discharge Summary (Signed)
Name: Donna Hall MRN: [id number] DOB: 24-Jun-1986 29 y.o. PCP: Dessa Phi, MD  Date of Admission: 10/23/2014  7:42 AM Date of Discharge: 10/26/2014 Attending Physician: Burns Spain, MD  Discharge Diagnosis: Principal Problem:   Rheumatoid arthritis flare Active Problems:   Rheumatoid arthritis   Psoriasis   Long term current use of systemic steroids   Systemic lupus erythematosus   Essential hypertension, benign   Pulmonary embolism   Headache   Total body pain   Condyloma acuminatum of perianal region   Rectal bleeding   Chronic anticoagulation   Lupus  Discharge Medications:   Medication List    STOP taking these medications        acetaminophen 325 MG tablet  Commonly known as:  TYLENOL     cephALEXin 500 MG capsule  Commonly known as:  KEFLEX     traMADol 50 MG tablet  Commonly known as:  ULTRAM      TAKE these medications        albuterol 108 (90 BASE) MCG/ACT inhaler  Commonly known as:  PROVENTIL HFA;VENTOLIN HFA  Inhale 2 puffs into the lungs every 4 (four) hours as needed for wheezing or shortness of breath.     azaTHIOprine 50 MG tablet  Commonly known as:  IMURAN  Take 5 tablets (250 mg total) by mouth daily.     diphenhydrAMINE 2 % cream  Commonly known as:  BENADRYL  Apply topically 3 (three) times daily as needed for itching.     hydroxychloroquine 200 MG tablet  Commonly known as:  PLAQUENIL  Take 1 tablet (200 mg total) by mouth 2 (two) times daily.     hydrOXYzine 10 MG tablet  Commonly known as:  ATARAX/VISTARIL  Take 1 tablet (10 mg total) by mouth every 8 (eight) hours as needed for itching.     mometasone-formoterol 100-5 MCG/ACT Aero  Commonly known as:  DULERA  Take 2 puffs first thing in am and then another 2 puffs about 12 hours later.     nystatin 100000 UNIT/ML suspension  Commonly known as:  MYCOSTATIN  Take 5 mLs (500,000 Units total) by mouth 4 (four) times daily.     oxyCODONE-acetaminophen  10-325 MG per tablet  Commonly known as:  PERCOCET  Take 1 tablet by mouth every 4 (four) hours as needed for pain.     predniSONE 10 MG tablet  Commonly known as:  DELTASONE  Take 1-4 tablets (10-40 mg total) by mouth daily with breakfast. 40 mg for 2 days, then 20 mg for 2 days, then 10 mg daily.     rivaroxaban 20 MG Tabs tablet  Commonly known as:  XARELTO  Take 1 tablet (20 mg total) by mouth daily with supper.        Disposition and follow-up:   Donna Hall was discharged from [facility] in Stable condition.  At the hospital follow up visit please address:  1.  Pain control, compliance with lupus medications, follow up with rheumatology, FNA of thyroid nodule, treatment of anal condyloma.  2.  Labs / imaging needed at time of follow-up: Repeat urinalysis.  3.  Pending labs/ test needing follow-up: None.  Follow-up Appointments: Follow-up Information    Follow up with Lora Paula, MD On 10/30/2014.   Specialty:  Family Medicine   Why:  11:15 am   Contact information:   [address] [place] [address] [phone]       Follow up with  Dr. Jonetta Speak (Rheumatology) On 10/28/2014.   Why:  1:30 pm   Contact information:   [facility] 7th floor [employer] [employer] [facility]) [facility] [place], [place] [address] P: [phone] F: [phone]       Discharge Instructions:  Thank you for allowing [place] to be involved in your healthcare while you were hospitalized at [facility].   Please note that there have been changes to your home medications.  --> PLEASE LOOK AT YOUR DISCHARGE MEDICATION LIST FOR DETAILS.   Please call your PCP if you have any questions or concerns, or any difficulty getting any of your medications.  Please return to the ER if you have worsening of your symptoms or new severe symptoms arise.  Make sure to  keep up with your lupus medications and follow up with your rheumatologist. We have scheduled you a follow up appointment.  Taper your prednisone to 40 mg for 2 days, then 20 mg for 2 days, then resume your 10 mg every day until you follow up with rheumatology.  Your PCP will need to refer your for a fine needle aspiration of your thyroid nodule. Discharge Instructions    Call MD for:  difficulty breathing, headache or visual disturbances    Complete by:  As directed      Call MD for:  extreme fatigue    Complete by:  As directed      Call MD for:  hives    Complete by:  As directed      Call MD for:  persistant dizziness or light-headedness    Complete by:  As directed      Call MD for:  persistant nausea and vomiting    Complete by:  As directed      Call MD for:  redness, tenderness, or signs of infection (pain, swelling, redness, odor or green/yellow discharge around incision site)    Complete by:  As directed      Call MD for:  severe uncontrolled pain    Complete by:  As directed      Call MD for:  temperature >100.4    Complete by:  As directed      Diet - low sodium heart healthy    Complete by:  As directed      Increase activity slowly    Complete by:  As directed           Consultations: None.  Procedures Performed:  Dg Chest 2 View  10/23/2014   CLINICAL DATA:  Generalized body aches for 3 days. Shortness of breath  EXAM: CHEST  2 VIEW  COMPARISON:  07/28/2014 CT. Plain films 09/11/2014 and multiple 2015 films, dating back to 08/14/2013.  FINDINGS: Low lung volumes. Patchy lung opacities in the left lung, stable to prior study and studies dating back to early 2015 compatible with chronic lung disease. No acute airspace opacities or effusions. No acute bony abnormality. Heart is normal size. Mediastinal contours are within normal limits.  IMPRESSION: Chronic lung changes. Hypoventilation. No acute cardiopulmonary disease.   Electronically Signed   By: Charlett Nose M.D.    On: 10/23/2014 09:38   Admission HPI:  29 yo female with hx of asthma, PE on xarelto, chronic pain, lupus, RA, psoriasis, interstitial lung disease on 2L home o2, depression here with generalized body pain, headache and some subjective fever. These started 3 days ago. Son may have had some respiratory symptoms. Patient has been  out of her imuran and tramadol. She was having generalized myalgia and also arthralgia which feels like her lupus flare. Having bifrontal headache. She has been compliant with all of her other meds, including her xarelto.   Has been noticing small amount of BRBPR for last 2 days when she wipes with toilet paper. It's non painful but does burn when she takes shower, feels that sometimes she wipes too hard with toilet paper and causes the bleeding. Has psoriasis so she thinks with some burning sensation with stools passing. Denies any ab pain, constipation, melena, hemoptysis. Denies any dysuria, polyuria, or blood in the urine. Her period is coming up in few days.   Hospital Course by problem list: Principal Problem:   Rheumatoid arthritis flare Active Problems:   Rheumatoid arthritis   Psoriasis   Long term current use of systemic steroids   Systemic lupus erythematosus   Essential hypertension, benign   Pulmonary embolism   Headache   Total body pain   Condyloma acuminatum of perianal region   Rectal bleeding   Chronic anticoagulation   Lupus   #SLE/rheumatoid arthritis flare The patient has a history of multiple autoimmune conditions. 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 She is on chronic prednisone 10 mg daily. She has previously followed with rheumatology at [facility], but she has not seen them in some time. She reported generalized aching along with a subjective fever for 3 days on presentation similar to  prior lupus flares. She reported being out of her home Imuran and tramadol, which prompted her to come to the emergency room. The patient was restarted on her home Plaquenil 200 mg twice a day and Imuran 250 mg daily, and her prednisone dose was increased to 60 mg daily. Her pain was controlled with oral Percocet and IV Dilaudid as needed. Her pain and mobility improved over the next few days and was tolerable upon discharge. At discharge, follow-up was arranged with her rheumatologist and primary care provider. Her prednisone was tapered over the next 5 days to her baseline of 10 mg daily.  She was discharged with Percocet 10-325 milligrams, #30 tabs to tie her over until she followed up with her rheumatologist and primary care provider.  #Thyroid nodule In review of the patient's previous records, a 2 cm nodule in her left thyroid gland was noted on CT angiogram in February 2016, and had not been followed up. This thyroid nodule was previously seen on ultrasound at [facility] in October 2015 and on CT scans back to 2010. She'll need to be referred for a fine-needle aspiration of this nodule to rule out malignancy.  #Anal condyloma The patient complained of bright red blood per rectum prior to presentation. Rectal exam demonstrated a likely anal condyloma. HIV was negative. She may benefit from treatment as an outpatient to help with her symptoms.   #Microscopic hematuria The patient has had persistent RBCs in her urine over the past several months. Her urinalyses typically contain many squamous epithelial cells, suggesting a dirty specimen. Her anal condyloma may be contributing as well. At follow-up, please recheck her urinalysis for clearing of hematuria and consider further workup if necessary.  #Oral thrush The patient was noted to have oral thrush on presentation likely secondary to her chronic prednisone use. She was given oral nystatin during the hospitalization, and she was given a  prescription for  14 days of additional therapy at discharge.   #History of pulmonary embolism The patient had a pulmonary embolism in May 2015. Her home Xarelto was continued during the hospitalization and at discharge.  #Asthma The patient's home albuterol and Dulera inhalers were continued during the hospitalization and at discharge.   Discharge Vitals:   BP 91/52 mmHg  Pulse 80  Temp(Src) 98.4 F (36.9 C) (Oral)  Resp 22  Ht 5\' 3"  (1.6 m)  Wt 304 lb (137.893 kg)  BMI 53.86 kg/m2  SpO2 96%  LMP 10/06/2014  Discharge Labs:  Results for orders placed or performed during the hospital encounter of 10/23/14 (from the past 24 hour(s))  Glucose, capillary     Status: None   Collection Time: 10/26/14  7:39 AM  Result Value Ref Range   Glucose-Capillary 96 70 - 99 mg/dL    Signed: 12/26/14, MD 10/26/2014, 11:33 AM    Services Ordered on Discharge: None. Equipment Ordered on Discharge: None.

## 2014-10-26 NOTE — Progress Notes (Signed)
  Date: 10/26/2014  Patient name: Donna Hall  Medical record number: [id number]  Date of birth: 18-Jul-1985   This patient has been seen and the plan of care was discussed with the house staff. Please see their note for complete details. I concur with their findings with the following additions/corrections: Donna Hall was seen this AM with Dr Glenard Haring and Mikey Bussing. She is stable for D/C today with steroid taper and opioid pain control for short term.   From chart review, it is not clear which rheum condition(s) she has. Dermatomysitis was dx with muscle bx 4/09, PM also in records, psoriasis dx skin bx, SLE, MCTD, RA all in her chart. Her care is hampered by multiple different MD at multiple centers in multiple specialties. Also language barrier is an issue and it is thought that she is illiterate in [place].   Additionally, her thyroid nodule was seen in [place] [facility] 10/15 and on CT's as far back as 2010 (nodules in L thyroid gland, largest 1.5 cm).   Dr Glenard Haring is arranging early F/U with her PCP.  Burns Spain, MD 10/26/2014, 10:25 AM

## 2014-10-26 NOTE — Progress Notes (Signed)
CSW (Clinical Child psychotherapist) provided pt with transportation packet. Pt confirmed she has a ride today. Pt has no further hospital social work needs. CSW signing off.  Dafina Suk, LCSWA [phone]

## 2014-10-26 NOTE — Progress Notes (Signed)
Subjective:    Currently, the patient ports improvement of her pain. Most of her pain is currently centered in her left shoulder and left hand. She endorses a good understanding of her condition, and she plans to keep up with her medications at discharge. She reports feeling as though she can go home today, and she thinks that her pain could be controlled with oral medications.  History obtained with an interpreter.  Interval Events: -Last received Dilaudid at 218 this morning.  -Vital signs stable overnight.    Objective:    Vital Signs:   Temp:  [98.4 F (36.9 C)-99.3 F (37.4 C)] 98.4 F (36.9 C) (05/02 0500) Pulse Rate:  [80-92] 80 (05/02 0500) Resp:  [21-22] 22 (05/02 0500) BP: (91-129)/(52-71) 91/52 mmHg (05/02 0500) SpO2:  [96 %-97 %] 96 % (05/02 0500) Weight:  [304 lb (137.893 kg)] 304 lb (137.893 kg) (05/02 0500) Last BM Date: 10/22/14  24-hour weight change: Weight change: 9.6 oz (0.272 kg)  Intake/Output:  No intake or output data in the 24 hours ending 10/26/14 0844    Physical Exam: General: Well-developed, well-nourished, in no acute distress; alert, appropriate and cooperative throughout examination.  Lungs:  Normal respiratory effort. Clear to auscultation BL without crackles or wheezes.  Heart: RRR. S1 and S2 normal without gallop, murmur, or rubs.  Abdomen:  BS normoactive. Soft, Nondistended, non-tender.  No masses or organomegaly.  Extremities: No pretibial edema. Pain with motion of left shoulder and palpation of left wrist and fourth digit, decreased from prior.      Labs:  Basic Metabolic Panel:  Recent Labs Lab 10/23/14 0817 10/25/14 0515  NA 140 139  K 3.6 4.1  CL 105 98*  CO2 28 34*  GLUCOSE 103* 91  BUN 12 10  CREATININE 0.52 0.51  CALCIUM 8.4 8.5*    Liver Function Tests:  Recent Labs Lab 10/23/14 0817  AST 24  ALT 17  ALKPHOS 67  BILITOT 0.6  PROT 6.3  ALBUMIN 3.3*   CBC:  Recent Labs Lab 10/23/14 0817  10/25/14 0515  WBC 8.7 8.0  NEUTROABS 6.7 5.3  HGB 14.2 12.9  HCT 46.2* 42.7  MCV 86.0 87.3  PLT 287 299    Cardiac Enzymes:  Recent Labs Lab 10/23/14 1809  CKTOTAL 21   CBG:  Recent Labs Lab 10/24/14 0857 10/25/14 0729 10/26/14 0739  GLUCAP 90 91 96    Microbiology: Results for orders placed or performed during the hospital encounter of 10/23/14  Urine culture     Status: None   Collection Time: 10/23/14  8:49 AM  Result Value Ref Range Status   Specimen Description URINE, RANDOM  Final   Special Requests Normal  Final   Colony Count   Final    >=100,000 COLONIES/ML Performed at [facility]    Culture   Final    Multiple bacterial morphotypes present, none predominant. Suggest appropriate recollection if clinically indicated. Performed at [facility]    Report Status 10/24/2014 FINAL  Final  Respiratory virus panel     Status: None   Collection Time: 10/23/14  4:03 PM  Result Value Ref Range Status   Respiratory Syncytial Virus A Negative Negative Final   Respiratory Syncytial Virus B Negative Negative Final   Influenza A Negative Negative Final   Influenza B Negative Negative Final   Parainfluenza 1 Negative Negative Final   Parainfluenza 2 Negative Negative Final   Parainfluenza 3 Negative Negative Final   Metapneumovirus Negative Negative  Final   Rhinovirus Negative Negative Final   Adenovirus Negative Negative Final    Comment: (NOTE) Performed At: [facility] [address] [place], [place] [id number] Mila Homer MD [id number]     Imaging: No results found.     Medications:    Infusions:    Scheduled Medications: . azaTHIOprine  250 mg Oral Daily  . hydrocerin   Topical BID  . hydroxychloroquine  200 mg Oral BID  . mometasone-formoterol  1 puff Inhalation BID  . nystatin  5 mL Oral QID  . predniSONE  60 mg Oral Q breakfast  . rivaroxaban  20 mg Oral Q supper    PRN Medications: acetaminophen,  albuterol, oxyCODONE-acetaminophen, promethazine   Assessment/ Plan:    Principal Problem:   Rheumatoid arthritis flare Active Problems:   Rheumatoid arthritis   Psoriasis   Long term current use of systemic steroids   Systemic lupus erythematosus   Essential hypertension, benign   Pulmonary embolism   Headache   Total body pain   Condyloma acuminatum of perianal region   Rectal bleeding   Chronic anticoagulation   Lupus  RA/lupus flare Pain much improved today with most pain centered in left arm. Patient feels comfortable going home today. 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  - Outpatient follow-up with PCP. - outpatient treatment with trichloacetitc acid application or topical fluorouracil - HIV neg.  Hx of PE on 10/2013 - cont xarelto. No PE seen on 02/2014 CT angio or 07/2014.   microscopic hematuria Question if secondary to anal condyloma.  many squamous epithelial cells.  - Follow up with PCP.  Oral thrush - treating with nystatin solution  Asthma  - cont albuterol, dulera  Thyroid nodule - 2 cm left thyroid gland Discuss with patient that she will need outpatient follow-up. - Needs FNA outpatient.    DVT PPX - Xarelto.  CODE STATUS -  full   CONSULTS PLACED - None.  - Disposition home this afternoon.   The patient does have a current PCP (03/2014, 08/2014, MD) and does not need an [facility] hospital follow-up appointment after discharge.    Is the [facility] hospital follow-up appointment a one-time only appointment? not applicable.  Does the patient have transportation limitations that hinder transportation to clinic appointments? yes   SERVICE NEEDED AT DISCHARGE - TO BE DETERMINED DURING HOSPITAL COURSE         Y = Yes, Blank = No PT:   OT:   RN:   Equipment:   Other:      Length of Stay: 2  day(s)   Signed: [facility], MD  PGY-1, Internal Medicine Resident Pager: [phone] (7AM-5PM) 10/26/2014, 8:44 AM

## 2014-10-30 ENCOUNTER — Ambulatory Visit: Payer: Medicaid Other | Admitting: Family Medicine

## 2014-10-30 ENCOUNTER — Inpatient Hospital Stay: Payer: Medicaid Other | Admitting: Family Medicine

## 2014-10-31 ENCOUNTER — Inpatient Hospital Stay (HOSPITAL_COMMUNITY)
Admission: EM | Admit: 2014-10-31 | Discharge: 2014-11-04 | DRG: 194 | Disposition: A | Discharge status: Home / Self Care | Payer: Medicaid Other | Attending: Internal Medicine | Admitting: Internal Medicine

## 2014-10-31 ENCOUNTER — Emergency Department (HOSPITAL_COMMUNITY): Payer: Medicaid Other

## 2014-10-31 ENCOUNTER — Encounter (HOSPITAL_COMMUNITY): Payer: Self-pay | Admitting: *Deleted

## 2014-10-31 ENCOUNTER — Inpatient Hospital Stay (HOSPITAL_COMMUNITY): Payer: Medicaid Other

## 2014-10-31 DIAGNOSIS — E876 Hypokalemia: Secondary | ICD-10-CM | POA: Diagnosis not present

## 2014-10-31 DIAGNOSIS — A630 Anogenital (venereal) warts: Secondary | ICD-10-CM | POA: Diagnosis present

## 2014-10-31 DIAGNOSIS — M351 Other overlap syndromes: Secondary | ICD-10-CM | POA: Diagnosis present

## 2014-10-31 DIAGNOSIS — J189 Pneumonia, unspecified organism: Principal | ICD-10-CM | POA: Diagnosis present

## 2014-10-31 DIAGNOSIS — M130 Polyarthritis, unspecified: Secondary | ICD-10-CM | POA: Diagnosis present

## 2014-10-31 DIAGNOSIS — R51 Headache: Secondary | ICD-10-CM

## 2014-10-31 DIAGNOSIS — M797 Fibromyalgia: Secondary | ICD-10-CM | POA: Diagnosis present

## 2014-10-31 DIAGNOSIS — G894 Chronic pain syndrome: Secondary | ICD-10-CM | POA: Diagnosis present

## 2014-10-31 DIAGNOSIS — J849 Interstitial pulmonary disease, unspecified: Secondary | ICD-10-CM | POA: Diagnosis present

## 2014-10-31 DIAGNOSIS — R509 Fever, unspecified: Secondary | ICD-10-CM

## 2014-10-31 DIAGNOSIS — I10 Essential (primary) hypertension: Secondary | ICD-10-CM | POA: Diagnosis present

## 2014-10-31 DIAGNOSIS — Z7901 Long term (current) use of anticoagulants: Secondary | ICD-10-CM | POA: Diagnosis not present

## 2014-10-31 DIAGNOSIS — G4733 Obstructive sleep apnea (adult) (pediatric): Secondary | ICD-10-CM | POA: Diagnosis present

## 2014-10-31 DIAGNOSIS — Z6841 Body Mass Index (BMI) 40.0 and over, adult: Secondary | ICD-10-CM | POA: Diagnosis not present

## 2014-10-31 DIAGNOSIS — Z7952 Long term (current) use of systemic steroids: Secondary | ICD-10-CM

## 2014-10-31 DIAGNOSIS — E66813 Obesity, class 3: Secondary | ICD-10-CM | POA: Diagnosis present

## 2014-10-31 DIAGNOSIS — M329 Systemic lupus erythematosus, unspecified: Secondary | ICD-10-CM | POA: Diagnosis present

## 2014-10-31 DIAGNOSIS — J8410 Pulmonary fibrosis, unspecified: Secondary | ICD-10-CM | POA: Diagnosis present

## 2014-10-31 DIAGNOSIS — Z86711 Personal history of pulmonary embolism: Secondary | ICD-10-CM

## 2014-10-31 DIAGNOSIS — R312 Other microscopic hematuria: Secondary | ICD-10-CM | POA: Diagnosis present

## 2014-10-31 DIAGNOSIS — M069 Rheumatoid arthritis, unspecified: Secondary | ICD-10-CM | POA: Diagnosis present

## 2014-10-31 DIAGNOSIS — E041 Nontoxic single thyroid nodule: Secondary | ICD-10-CM | POA: Diagnosis present

## 2014-10-31 DIAGNOSIS — K625 Hemorrhage of anus and rectum: Secondary | ICD-10-CM | POA: Diagnosis present

## 2014-10-31 DIAGNOSIS — R0602 Shortness of breath: Secondary | ICD-10-CM | POA: Insufficient documentation

## 2014-10-31 DIAGNOSIS — J45901 Unspecified asthma with (acute) exacerbation: Secondary | ICD-10-CM | POA: Diagnosis present

## 2014-10-31 DIAGNOSIS — L409 Psoriasis, unspecified: Secondary | ICD-10-CM | POA: Diagnosis present

## 2014-10-31 DIAGNOSIS — Z8632 Personal history of gestational diabetes: Secondary | ICD-10-CM

## 2014-10-31 DIAGNOSIS — F329 Major depressive disorder, single episode, unspecified: Secondary | ICD-10-CM | POA: Diagnosis present

## 2014-10-31 DIAGNOSIS — M2550 Pain in unspecified joint: Secondary | ICD-10-CM

## 2014-10-31 DIAGNOSIS — Y95 Nosocomial condition: Secondary | ICD-10-CM

## 2014-10-31 DIAGNOSIS — Z9981 Dependence on supplemental oxygen: Secondary | ICD-10-CM

## 2014-10-31 DIAGNOSIS — Z7951 Long term (current) use of inhaled steroids: Secondary | ICD-10-CM

## 2014-10-31 DIAGNOSIS — R0902 Hypoxemia: Secondary | ICD-10-CM | POA: Diagnosis present

## 2014-10-31 DIAGNOSIS — R519 Headache, unspecified: Secondary | ICD-10-CM | POA: Diagnosis present

## 2014-10-31 DIAGNOSIS — R0600 Dyspnea, unspecified: Secondary | ICD-10-CM | POA: Diagnosis present

## 2014-10-31 HISTORY — DX: Sleep apnea, unspecified: G47.30

## 2014-10-31 LAB — URINALYSIS, ROUTINE W REFLEX MICROSCOPIC
Bilirubin Urine: NEGATIVE
Glucose, UA: NEGATIVE mg/dL
Hgb urine dipstick: NEGATIVE
KETONES UR: NEGATIVE mg/dL
LEUKOCYTES UA: NEGATIVE
NITRITE: NEGATIVE
PROTEIN: NEGATIVE mg/dL
Specific Gravity, Urine: 1.019 (ref 1.005–1.030)
Urobilinogen, UA: 0.2 mg/dL (ref 0.0–1.0)
pH: 5.5 (ref 5.0–8.0)

## 2014-10-31 LAB — CBC WITH DIFFERENTIAL/PLATELET
BASOS PCT: 0 % (ref 0–1)
Basophils Absolute: 0 10*3/uL (ref 0.0–0.1)
EOS ABS: 0.1 10*3/uL (ref 0.0–0.7)
Eosinophils Relative: 0 % (ref 0–5)
HCT: 44.7 % (ref 36.0–46.0)
Hemoglobin: 14 g/dL (ref 12.0–15.0)
Lymphocytes Relative: 15 % (ref 12–46)
Lymphs Abs: 2.7 10*3/uL (ref 0.7–4.0)
MCH: 26.5 pg (ref 26.0–34.0)
MCHC: 31.3 g/dL (ref 30.0–36.0)
MCV: 84.5 fL (ref 78.0–100.0)
MONO ABS: 0.7 10*3/uL (ref 0.1–1.0)
Monocytes Relative: 4 % (ref 3–12)
NEUTROS ABS: 14.3 10*3/uL — AB (ref 1.7–7.7)
Neutrophils Relative %: 81 % — ABNORMAL HIGH (ref 43–77)
Platelets: 255 10*3/uL (ref 150–400)
RBC: 5.29 MIL/uL — ABNORMAL HIGH (ref 3.87–5.11)
RDW: 16.6 % — AB (ref 11.5–15.5)
WBC: 17.8 10*3/uL — ABNORMAL HIGH (ref 4.0–10.5)

## 2014-10-31 LAB — BASIC METABOLIC PANEL
ANION GAP: 9 (ref 5–15)
BUN: 7 mg/dL (ref 6–20)
CALCIUM: 8.4 mg/dL — AB (ref 8.9–10.3)
CHLORIDE: 99 mmol/L — AB (ref 101–111)
CO2: 27 mmol/L (ref 22–32)
Creatinine, Ser: 0.53 mg/dL (ref 0.44–1.00)
GFR calc Af Amer: >60 mL/min (ref >60)
GFR calc non Af Amer: >60 mL/min (ref >60)
Glucose, Bld: 94 mg/dL (ref 70–99)
Potassium: 3.4 mmol/L — ABNORMAL LOW (ref 3.5–5.1)
SODIUM: 135 mmol/L (ref 135–145)

## 2014-10-31 LAB — MRSA PCR SCREENING: MRSA by PCR: NEGATIVE

## 2014-10-31 LAB — INFLUENZA PANEL BY PCR (TYPE A & B)
H1N1FLUPCR: NOT DETECTED
Influenza A By PCR: NEGATIVE
Influenza B By PCR: NEGATIVE

## 2014-10-31 LAB — STREP PNEUMONIAE URINARY ANTIGEN: Strep Pneumo Urinary Antigen: NEGATIVE

## 2014-10-31 LAB — I-STAT CG4 LACTIC ACID, ED: Lactic Acid, Venous: 1.46 mmol/L (ref 0.5–2.0)

## 2014-10-31 MED ORDER — SODIUM CHLORIDE 0.9 % IJ SOLN
3.0000 mL | Freq: Two times a day (BID) | INTRAMUSCULAR | Status: DC
  Administered 2014-10-31 – 2014-11-04 (×6): 3 mL via INTRAVENOUS
  Filled 2014-10-31: qty 3

## 2014-10-31 MED ORDER — PREDNISONE 20 MG PO TABS
40.0000 mg | ORAL_TABLET | Freq: Every day | ORAL | Status: DC
  Administered 2014-10-31 – 2014-11-01 (×2): 40 mg via ORAL
  Filled 2014-10-31 (×3): qty 2

## 2014-10-31 MED ORDER — HYDROMORPHONE HCL 1 MG/ML IJ SOLN
1.0000 mg | Freq: Once | INTRAMUSCULAR | Status: AC
  Administered 2014-10-31: 1 mg via INTRAVENOUS
  Filled 2014-10-31: qty 1

## 2014-10-31 MED ORDER — HYDROXYZINE HCL 10 MG PO TABS
10.0000 mg | ORAL_TABLET | Freq: Three times a day (TID) | ORAL | Status: DC | PRN
  Administered 2014-10-31: 10 mg via ORAL
  Filled 2014-10-31 (×2): qty 1

## 2014-10-31 MED ORDER — ALBUTEROL SULFATE (2.5 MG/3ML) 0.083% IN NEBU
2.5000 mg | INHALATION_SOLUTION | RESPIRATORY_TRACT | Status: DC | PRN
  Administered 2014-10-31: 2.5 mg via RESPIRATORY_TRACT
  Filled 2014-10-31: qty 3

## 2014-10-31 MED ORDER — RIVAROXABAN 20 MG PO TABS
20.0000 mg | ORAL_TABLET | Freq: Every day | ORAL | Status: DC
  Administered 2014-10-31 – 2014-11-04 (×5): 20 mg via ORAL
  Filled 2014-10-31 (×5): qty 1

## 2014-10-31 MED ORDER — ALBUTEROL SULFATE (2.5 MG/3ML) 0.083% IN NEBU
INHALATION_SOLUTION | RESPIRATORY_TRACT | Status: AC
  Administered 2014-10-31: 2.5 mg
  Filled 2014-10-31: qty 3

## 2014-10-31 MED ORDER — VANCOMYCIN HCL 10 G IV SOLR
1500.0000 mg | INTRAVENOUS | Status: AC
  Administered 2014-10-31: 1500 mg via INTRAVENOUS
  Filled 2014-10-31: qty 1500

## 2014-10-31 MED ORDER — MOMETASONE FURO-FORMOTEROL FUM 100-5 MCG/ACT IN AERO
1.0000 | INHALATION_SPRAY | Freq: Two times a day (BID) | RESPIRATORY_TRACT | Status: DC
  Administered 2014-10-31 – 2014-11-04 (×7): 1 via RESPIRATORY_TRACT
  Filled 2014-10-31: qty 8.8

## 2014-10-31 MED ORDER — NYSTATIN 100000 UNIT/ML MT SUSP
5.0000 mL | Freq: Four times a day (QID) | OROMUCOSAL | Status: DC
  Administered 2014-10-31 – 2014-11-04 (×18): 500000 [IU] via ORAL
  Filled 2014-10-31 (×19): qty 5

## 2014-10-31 MED ORDER — HYDROMORPHONE HCL 1 MG/ML IJ SOLN
1.0000 mg | INTRAMUSCULAR | Status: DC | PRN
  Administered 2014-10-31 (×2): 1 mg via INTRAVENOUS
  Filled 2014-10-31 (×2): qty 1

## 2014-10-31 MED ORDER — SODIUM CHLORIDE 0.9 % IV BOLUS (SEPSIS)
1000.0000 mL | Freq: Once | INTRAVENOUS | Status: AC
  Administered 2014-10-31: 1000 mL via INTRAVENOUS

## 2014-10-31 MED ORDER — POTASSIUM CHLORIDE CRYS ER 20 MEQ PO TBCR
40.0000 meq | EXTENDED_RELEASE_TABLET | Freq: Once | ORAL | Status: AC
  Administered 2014-10-31: 40 meq via ORAL
  Filled 2014-10-31: qty 2

## 2014-10-31 MED ORDER — AZATHIOPRINE 50 MG PO TABS
250.0000 mg | ORAL_TABLET | Freq: Every day | ORAL | Status: DC
  Administered 2014-10-31 – 2014-11-04 (×5): 250 mg via ORAL
  Filled 2014-10-31 (×5): qty 5

## 2014-10-31 MED ORDER — HYDROMORPHONE HCL 1 MG/ML IJ SOLN
1.0000 mg | INTRAMUSCULAR | Status: DC | PRN
  Administered 2014-10-31: 1.5 mg via INTRAVENOUS
  Administered 2014-11-01: 1 mg via INTRAVENOUS
  Administered 2014-11-01: 1.5 mg via INTRAVENOUS
  Filled 2014-10-31 (×3): qty 2

## 2014-10-31 MED ORDER — VANCOMYCIN HCL IN DEXTROSE 1-5 GM/200ML-% IV SOLN
1000.0000 mg | Freq: Once | INTRAVENOUS | Status: AC
  Administered 2014-10-31: 1000 mg via INTRAVENOUS
  Filled 2014-10-31: qty 200

## 2014-10-31 MED ORDER — PIPERACILLIN-TAZOBACTAM 3.375 G IVPB
3.3750 g | Freq: Three times a day (TID) | INTRAVENOUS | Status: DC
  Administered 2014-10-31 – 2014-11-02 (×5): 3.375 g via INTRAVENOUS
  Filled 2014-10-31 (×7): qty 50

## 2014-10-31 MED ORDER — PIPERACILLIN-TAZOBACTAM 3.375 G IVPB 30 MIN
3.3750 g | Freq: Once | INTRAVENOUS | Status: AC
  Administered 2014-10-31: 3.375 g via INTRAVENOUS
  Filled 2014-10-31: qty 50

## 2014-10-31 MED ORDER — HEPARIN SODIUM (PORCINE) 5000 UNIT/ML IJ SOLN
5000.0000 [IU] | Freq: Three times a day (TID) | INTRAMUSCULAR | Status: DC
  Administered 2014-10-31: 5000 [IU] via SUBCUTANEOUS
  Filled 2014-10-31: qty 1

## 2014-10-31 MED ORDER — IPRATROPIUM BROMIDE 0.02 % IN SOLN
RESPIRATORY_TRACT | Status: AC
  Administered 2014-10-31: 0.5 mg
  Filled 2014-10-31: qty 2.5

## 2014-10-31 MED ORDER — HYDROXYCHLOROQUINE SULFATE 200 MG PO TABS
200.0000 mg | ORAL_TABLET | Freq: Two times a day (BID) | ORAL | Status: DC
  Administered 2014-10-31 – 2014-11-04 (×9): 200 mg via ORAL
  Filled 2014-10-31 (×11): qty 1

## 2014-10-31 MED ORDER — OXYCODONE-ACETAMINOPHEN 5-325 MG PO TABS
1.0000 | ORAL_TABLET | Freq: Four times a day (QID) | ORAL | Status: DC | PRN
  Administered 2014-10-31 – 2014-11-01 (×3): 1 via ORAL
  Filled 2014-10-31 (×3): qty 1

## 2014-10-31 MED ORDER — VANCOMYCIN HCL IN DEXTROSE 1-5 GM/200ML-% IV SOLN
1000.0000 mg | Freq: Three times a day (TID) | INTRAVENOUS | Status: DC
  Administered 2014-11-01 – 2014-11-02 (×4): 1000 mg via INTRAVENOUS
  Filled 2014-10-31 (×6): qty 200

## 2014-10-31 MED ORDER — ACETAMINOPHEN 325 MG PO TABS
650.0000 mg | ORAL_TABLET | Freq: Four times a day (QID) | ORAL | Status: DC | PRN
  Administered 2014-10-31: 650 mg via ORAL
  Filled 2014-10-31: qty 2

## 2014-10-31 MED ORDER — IPRATROPIUM-ALBUTEROL 0.5-2.5 (3) MG/3ML IN SOLN
3.0000 mL | RESPIRATORY_TRACT | Status: DC
  Administered 2014-10-31 – 2014-11-02 (×16): 3 mL via RESPIRATORY_TRACT
  Filled 2014-10-31 (×16): qty 3

## 2014-10-31 NOTE — ED Notes (Signed)
Oxygen applied 4l via [place]

## 2014-10-31 NOTE — ED Provider Notes (Signed)
CSN: [id number]     Arrival date & time 10/31/14  9326 History   First MD Initiated Contact with Patient 10/31/14 [phone]     Chief Complaint  Patient presents with  . Shortness of Breath  . Fever   Patient is Spanish-speaking and history of present illness is obtained via interpreter.  (Consider location/radiation/quality/duration/timing/severity/associated sxs/prior Treatment) HPI Donna Hall is a 29 y.o. female with a history of SLE, PE on Xarelto, obesity and chronic pain comes in for evaluation of fever, chills, shortness of breath. Patient was recently discharged on 5/2 for similar symptoms. Patient reports she was going to follow-up with her PCP, Dr. Armen Pickup, but was unable to obtain an appointment. She reports her symptoms of headache, cough, shortness of breath, body aches have not improved since her discharge. She reports subjective fever starting yesterday. She reports being on 2 L of oxygen at home for 3 months, but one or 2 months ago they came and picked up all the equipment and have not brought any more. She denies chest pain, dizziness, syncope, abdominal pain, nausea or vomiting, urinary symptoms, vaginal bleeding or discharge, dark or bloody stools. Rates her pain as a 10/10. No other aggravating or modifying factors.   Past Medical History  Diagnosis Date  . Psoriasis 2010    as a child  . Chronic pain disorder   . Obesity   . Chronic steroid use 2010  . Lupus 2000  . Lupus (systemic lupus erythematosus) 2010  . Rheumatoid arthritis(714.0) 2010  . Hypertension 2010  . Pneumonia "several times"  . Interstitial lung disease     /notes 05/15/2014  . On home oxygen therapy     "2L; 24/7" (05/15/2014)  . Pulmonary embolism     hx/notes 05/15/2014  . History of blood transfusion     "because white cells ate the red cells"  . Headache     "just when I have the pain from aching bones and psorasis and/or lupus symptoms" (05/15/2104)  . Disease of pericardium  12/21/2008    2008: trivial 09/2007: moderate to large, improved on f/u ECHO   . Asthma   . Gestational diabetes 2006  . Depression    Past Surgical History  Procedure Laterality Date  . Thigh / knee soft tissue biopsy Right 2011    thigh  . Cesarean section  2010   Family History  Problem Relation Age of Onset  . Diabetes Mother   . Hypertension Mother   . Cancer Neg Hx   . Early death Neg Hx   . Heart disease Neg Hx    History  Substance Use Topics  . Smoking status: Never Smoker   . Smokeless tobacco: Never Used  . Alcohol Use: No   OB History    No data available     Review of Systems A 10 point review of systems was completed and was negative except for pertinent positives and negatives as mentioned in the history of present illness     Allergies  Review of patient's allergies indicates no known allergies.  Home Medications   Prior to Admission medications   Medication Sig Start Date End Date Taking? Authorizing Provider  albuterol (PROVENTIL HFA;VENTOLIN HFA) 108 (90 BASE) MCG/ACT inhaler Inhale 2 puffs into the lungs every 4 (four) hours as needed for wheezing or shortness of breath. 08/05/14  Yes Nyoka Cowden, MD  azaTHIOprine (IMURAN) 50 MG tablet Take 5 tablets (250 mg total) by mouth daily. 10/26/14  Yes Mitzie Na  J Moding, MD  diphenhydrAMINE (BENADRYL) 2 % cream Apply topically 3 (three) times daily as needed for itching. 09/18/14  Yes Dessa Phi, MD  hydroxychloroquine (PLAQUENIL) 200 MG tablet Take 1 tablet (200 mg total) by mouth 2 (two) times daily. 10/26/14  Yes Adrian Blackwater Moding, MD  hydrOXYzine (ATARAX/VISTARIL) 10 MG tablet Take 1 tablet (10 mg total) by mouth every 8 (eight) hours as needed for itching. 09/18/14  Yes Josalyn Funches, MD  mometasone-formoterol (DULERA) 100-5 MCG/ACT AERO Take 2 puffs first thing in am and then another 2 puffs about 12 hours later. Patient taking differently: Inhale 1 puff into the lungs 2 (two) times daily.  07/22/14   Yes Nyoka Cowden, MD  nystatin (MYCOSTATIN) 100000 UNIT/ML suspension Take 5 mLs (500,000 Units total) by mouth 4 (four) times daily. 10/26/14 11/09/14 Yes Adrian Blackwater Moding, MD  oxyCODONE-acetaminophen (PERCOCET) 10-325 MG per tablet Take 1 tablet by mouth every 4 (four) hours as needed for pain. 10/26/14  Yes Adrian Blackwater Moding, MD  predniSONE (DELTASONE) 10 MG tablet Take 1-4 tablets (10-40 mg total) by mouth daily with breakfast. 40 mg for 2 days, then 20 mg for 2 days, then 10 mg daily. 10/26/14  Yes Adrian Blackwater Moding, MD  rivaroxaban (XARELTO) 20 MG TABS tablet Take 1 tablet (20 mg total) by mouth daily with supper. 04/02/14  Yes Josalyn Funches, MD   BP 117/69 mmHg  Pulse 123  Temp(Src) 102.8 F (39.3 C) (Oral)  Resp 20  Ht  (1.549 m)  Wt 304 lb (137.893 kg)  BMI 57.47 kg/m2  SpO2 96%  LMP 10/06/2014 Physical Exam  Constitutional: She is oriented to person, place, and time. She appears well-developed and well-nourished.  Obese, appears uncomfortable and mildly diaphoretic.  HENT:  Head: Normocephalic and atraumatic.  Mouth/Throat: Oropharynx is clear and moist.  Eyes: Conjunctivae are normal. Pupils are equal, round, and reactive to light. Right eye exhibits no discharge. Left eye exhibits no discharge. No scleral icterus.  Neck: Neck supple.  Cardiovascular: Normal rate, regular rhythm and normal heart sounds.   Pulmonary/Chest: Effort normal and breath sounds normal. No stridor.  Mild tachypnea with expiratory wheezing diffusely in all fields.  Abdominal: Soft. There is no tenderness.  Obese. Abdomen otherwise nontender  Musculoskeletal: She exhibits no edema or tenderness.  Neurological: She is alert and oriented to person, place, and time.  Cranial Nerves II-XII grossly intact  Skin: Skin is warm and dry. No rash noted.  Psychiatric: She has a normal mood and affect.  Nursing note and vitals reviewed.   ED Course  Procedures (including critical care time) Labs  Review Labs Reviewed  BASIC METABOLIC PANEL - Abnormal; Notable for the following:    Potassium 3.4 (*)    Chloride 99 (*)    Calcium 8.4 (*)    All other components within normal limits  CBC WITH DIFFERENTIAL/PLATELET - Abnormal; Notable for the following:    WBC 17.8 (*)    RBC 5.29 (*)    RDW 16.6 (*)    Neutrophils Relative % 81 (*)    Neutro Abs 14.3 (*)    All other components within normal limits  CULTURE, BLOOD (ROUTINE X 2)  CULTURE, BLOOD (ROUTINE X 2)  URINE CULTURE  URINALYSIS, ROUTINE W REFLEX MICROSCOPIC  I-STAT CG4 LACTIC ACID, ED  I-STAT CG4 LACTIC ACID, ED    Imaging Review Dg Chest Port 1 View  10/31/2014   CLINICAL DATA:  29 year old female with a history of productive  cough  EXAM: PORTABLE CHEST - 1 VIEW  COMPARISON:  Multiple prior, most recent plain film 10/23/2014. Prior CT 07/29/2014  FINDINGS: Low lung volumes accentuates the interstitium.  Increasing interstitial opacities of the left lung.  Apical lordotic positioning.  Cardiomediastinal silhouette unchanged.  No large pleural effusion or pneumothorax.  No displaced fracture.  IMPRESSION: Low lung volumes, with increasing opacities on the left concerning for developing infection given the patient history.  Signed,  Yvone Neu. Loreta Ave, DO  [facility]  [facility]   Electronically Signed   By: Gilmer Mor D.O.   On: 10/31/2014 07:23     EKG Interpretation None     Meds given in ED:  Medications  ipratropium-albuterol (DUONEB) 0.5-2.5 (3) MG/3ML nebulizer solution 3 mL (3 mLs Nebulization Given 10/31/14 0635)  acetaminophen (TYLENOL) tablet 650 mg (650 mg Oral Given 10/31/14 0633)  vancomycin (VANCOCIN) IVPB 1000 mg/200 mL premix (not administered)  heparin injection 5,000 Units (not administered)  sodium chloride 0.9 % injection 3 mL (not administered)  HYDROmorphone (DILAUDID) injection 1 mg (not administered)  albuterol (PROVENTIL) (2.5 MG/3ML) 0.083%  nebulizer solution 2.5 mg (not administered)  ipratropium (ATROVENT) 0.02 % nebulizer solution (0.5 mg  Given 10/31/14 0537)  albuterol (PROVENTIL) (2.5 MG/3ML) 0.083% nebulizer solution (2.5 mg  Given 10/31/14 0537)  HYDROmorphone (DILAUDID) injection 1 mg (1 mg Intravenous Given 10/31/14 0634)  sodium chloride 0.9 % bolus 1,000 mL (0 mLs Intravenous Stopped 10/31/14 0828)  piperacillin-tazobactam (ZOSYN) IVPB 3.375 g (0 g Intravenous Stopped 10/31/14 0828)  sodium chloride 0.9 % bolus 1,000 mL (1,000 mLs Intravenous New Bag/Given 10/31/14 0746)  HYDROmorphone (DILAUDID) injection 1 mg (1 mg Intravenous Given 10/31/14 0744)    New Prescriptions   No medications on file   Filed Vitals:   10/31/14 0700 10/31/14 0715 10/31/14 0745 10/31/14 0821  BP: 109/54 108/52 116/57 117/69  Pulse: 124 124 117 123  Temp:      TempSrc:      Resp: 27 21 23 20   Height:      Weight:      SpO2: 94% 93% 95% 96%    MDM  Patient with essentially, PE on Xarelto comes in for evaluation of fever, shortness of breath, body aches. Patient febrile to 102.8, tachycardic to 120s, currently on 4 L nasal cannula, mildly tachypneic with expiratory wheezing. Maintaining pressures in systolic 120s Leukocytosis 17.8, lactic acid 1.46 Chest x-ray shows evidence of developing pneumonia. Patient meets SIRS criteria, suspected pulmonary source, initiated sepsis order set. Started on vancomycin and Zosyn in the ED for treatment of HCAP. 2 L normal saline given.  Patient recently discharged on 5/2 from internal medicine teaching service, will consult for readmission. I discussed and reviewed this case with my attending, Dr. 7/2 who agrees with plan for admission. Consult to internal medicine, teaching service, Dr. Norlene Campbell will see in the ED. patient admitted Final diagnoses:  SOB (shortness of breath)  HCAP (healthcare-associated pneumonia)  Fever, unspecified fever cause       Mikey Bussing, PA-C 10/31/14 12/31/14  2330, MD 11/02/14 1800

## 2014-10-31 NOTE — Progress Notes (Signed)
Went by patient's room to re-evaluate her respiratory status. Her family and children are at her bedside. She reports a lot of coughing and generalized body pain. She is saturating 94% on 3 L oxygen via [place].   Physical Exam General: resting in bed, appears uncomfortable HEENT: [place]/AT, EOMI, mucus membranes moist  CV: tachycardic in 110s, no m/g/r Pulm: diffuse wheezing bilaterally, increased work of breathing   Assessment/Plan: Cannot rule out HCAP given presentation of fever, increased sputum production, and dyspnea.  - Restart Vanc and Zosyn - Add Percocet 5-325 mg Q6H PRN pain - Change Dilaudid to 1-1.5 mg Q3H - Advised nurse to give albuterol nebs in between duonebs  Rich Number, MD IMTS PGY-1 Pager: [phone]

## 2014-10-31 NOTE — H&P (Signed)
Date: 10/31/2014               Patient Name:  Donna Hall MRN: [id number]  DOB: Aug 31, 1985 Age / Sex: 29 y.o., female   PCP: Dessa Phi, MD         Medical Service: Internal Medicine Teaching Service         Attending Physician: Dr. Aletta Edouard, MD    First Contact: Dr. Rich Number Pager: [phone]  Second Contact: Dr. Carlynn Purl  Pager: [phone]       After Hours (After 5p/  First Contact Pager: [phone]  weekends / holidays): Second Contact Pager: [phone]   Chief Complaint: Fever, dyspnea, polyarthralgias   History of Present Illness: [name] is a 28yo woman with PMHx of SLE, rheumatoid arthritis, asthma, PE on xarelto, interstitial lung disease, and depression who presents to the ED with fever and dyspnea. Patient is Spanish-speaking and history was obtained through a Radio producer. Patient was hospitalized from 4/29-5/2 for an acute RA flare. She improved after restarting her home Plaquenil 200 mg BID and Imuran 250 mg daily, and her Prednisone was increased to 60 mg daily. She was discharged on a Prednisone taper starting at 40 mg and decreasing over the next 5 days to her baseline of 10 mg daily. An appointment with her rheumatologist was scheduled on 5/4 but the patient missed this appointment due to transportation issues. She also missed her appointment with her PCP yesterday due to being unaware of the appointment.   Patient states she came to the ED because of fevers at home and increased dyspnea. She reports not feeling well in general. She describes dyspnea at rest and on exertion. She reports a productive cough of yellow sputum. She notes headaches and joint pains in her hands, wrists, and knees. She states she did complete the Prednisone taper and has been taking 10 mg daily. She denies any chest pain, abdominal pain, nausea, vomiting, and dysuria.   In the ED, she was noted to be tachycardic in the 110s-120s, fever of 102.8, and RR 25-27. A portable  CXR was concerning for possible left lower lobe infiltrates. She was started on Vanc and Zosyn empirically.   Meds: Current Facility-Administered Medications  Medication Dose Route Frequency Provider Last Rate Last Dose  . acetaminophen (TYLENOL) tablet 650 mg  650 mg Oral Q6H PRN Joycie Peek, PA-C   650 mg at 10/31/14 9012  . albuterol (PROVENTIL) (2.5 MG/3ML) 0.083% nebulizer solution 2.5 mg  2.5 mg Nebulization Q2H PRN Gust Rung, DO      . heparin injection 5,000 Units  5,000 Units Subcutaneous 3 times per day Gust Rung, DO      . HYDROmorphone (DILAUDID) injection 1 mg  1 mg Intravenous Q4H PRN Gust Rung, DO      . ipratropium-albuterol (DUONEB) 0.5-2.5 (3) MG/3ML nebulizer solution 3 mL  3 mL Nebulization Q4H Joycie Peek, PA-C   3 mL at 10/31/14 2241  . sodium chloride 0.9 % injection 3 mL  3 mL Intravenous Q12H Gust Rung, DO      . vancomycin (VANCOCIN) IVPB 1000 mg/200 mL premix  1,000 mg Intravenous Once Marisa Severin, MD       Current Outpatient Prescriptions  Medication Sig Dispense Refill  . albuterol (PROVENTIL HFA;VENTOLIN HFA) 108 (90 BASE) MCG/ACT inhaler Inhale 2 puffs into the lungs every 4 (four) hours as needed for wheezing or shortness of breath. 8 g 11  . azaTHIOprine (IMURAN) 50 MG  tablet Take 5 tablets (250 mg total) by mouth daily. 150 tablet 3  . diphenhydrAMINE (BENADRYL) 2 % cream Apply topically 3 (three) times daily as needed for itching. 60 g 0  . hydroxychloroquine (PLAQUENIL) 200 MG tablet Take 1 tablet (200 mg total) by mouth 2 (two) times daily. 60 tablet 3  . hydrOXYzine (ATARAX/VISTARIL) 10 MG tablet Take 1 tablet (10 mg total) by mouth every 8 (eight) hours as needed for itching. 90 tablet 5  . mometasone-formoterol (DULERA) 100-5 MCG/ACT AERO Take 2 puffs first thing in am and then another 2 puffs about 12 hours later. (Patient taking differently: Inhale 1 puff into the lungs 2 (two) times daily. )    . nystatin (MYCOSTATIN) 100000  UNIT/ML suspension Take 5 mLs (500,000 Units total) by mouth 4 (four) times daily. 240 mL 0  . oxyCODONE-acetaminophen (PERCOCET) 10-325 MG per tablet Take 1 tablet by mouth every 4 (four) hours as needed for pain. 30 tablet 0  . predniSONE (DELTASONE) 10 MG tablet Take 1-4 tablets (10-40 mg total) by mouth daily with breakfast. 40 mg for 2 days, then 20 mg for 2 days, then 10 mg daily. 50 tablet 0  . rivaroxaban (XARELTO) 20 MG TABS tablet Take 1 tablet (20 mg total) by mouth daily with supper. 30 tablet 7    Allergies: Allergies as of 10/31/2014  . (No Known Allergies)   Past Medical History  Diagnosis Date  . Psoriasis 2010    as a child  . Chronic pain disorder   . Obesity   . Chronic steroid use 2010  . Lupus 2000  . Lupus (systemic lupus erythematosus) 2010  . Rheumatoid arthritis(714.0) 2010  . Hypertension 2010  . Pneumonia "several times"  . Interstitial lung disease     /notes 05/15/2014  . On home oxygen therapy     "2L; 24/7" (05/15/2014)  . Pulmonary embolism     hx/notes 05/15/2014  . History of blood transfusion     "because white cells ate the red cells"  . Headache     "just when I have the pain from aching bones and psorasis and/or lupus symptoms" (05/15/2104)  . 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 Asthma   . Gestational diabetes 2006  . Depression    Past Surgical History  Procedure Laterality Date  . Thigh / knee soft tissue biopsy Right 2011    thigh  . Cesarean section  2010   Family History  Problem Relation Age of Onset  . Diabetes Mother   . Hypertension Mother   . Cancer Neg Hx   . Early death Neg Hx   . Heart disease Neg Hx    History   Social History  . Marital Status: Single    Spouse Name: N/A  . Number of Children: 3  . Years of Education: 1   Occupational History  . Unemployed     Social History Main Topics  . Smoking status: Never Smoker   . Smokeless  tobacco: Never Used  . Alcohol Use: No  . Drug Use: No  . Sexual Activity: Not Currently    Birth Control/ Protection: Injection   Other Topics Concern  . Not on file   Social History Narrative   Patient lives with husband and 3 children (48 in [place], 15 and 4) in [place]. She migrated form [place] in 2005. She does not work. Has completed the first  grade. Cannot read and write.     Review of Systems: General: Denies night sweats, changes in weight, changes in appetite HEENT: Denies ear pain, changes in vision, rhinorrhea, sore throat CV: Denies palpitations, orthopnea Pulm: See above  GI: Denies diarrhea, constipation, melena, hematochezia GU: See above  Msk: Denies muscle cramps Neuro: Denies weakness, numbness, tingling Skin: Denies rashes, bruising  Physical Exam: Blood pressure 117/69, pulse 123, temperature 102.8 F (39.3 C), temperature source Oral, resp. rate 20, height  (1.549 m), weight 304 lb (137.893 kg), last menstrual period 10/06/2014, SpO2 96 %. General: young obese woman sitting up in bed, in mild respiratory distress  HEENT: [address]/AT, EOMI, sclera anicteric, mucus membranes moist Neck: obese, no nuchal rigidity CV: tachycardic in 110s, no m/g/r Pulm: diffuse wheezes bilaterally, breaths mildly labored on 2 L oxygen via [place] Abd: BS+, soft, obese, non-tender Ext: warm, no edema. Significant wrist tenderness, R>L. She also has tenderness in her bilateral finger joints and knees. No excessive swelling or erythema.   Neuro: alert and oriented x 3, no focal deficits   Lab results: Basic Metabolic Panel:  Recent Labs  04/54/09 0624  NA 135  K 3.4*  CL 99*  CO2 27  GLUCOSE 94  BUN 7  CREATININE 0.53  CALCIUM 8.4*   CBC:  Recent Labs  10/31/14 0624  WBC 17.8*  NEUTROABS 14.3*  HGB 14.0  HCT 44.7  MCV 84.5  PLT 255    Imaging results:  Dg Chest Port 1 View  10/31/2014   CLINICAL DATA:  29 year old female with a history of productive  cough  EXAM: PORTABLE CHEST - 1 VIEW  COMPARISON:  Multiple prior, most recent plain film 10/23/2014. Prior CT 07/29/2014  FINDINGS: Low lung volumes accentuates the interstitium.  Increasing interstitial opacities of the left lung.  Apical lordotic positioning.  Cardiomediastinal silhouette unchanged.  No large pleural effusion or pneumothorax.  No displaced fracture.  IMPRESSION: Low lung volumes, with increasing opacities on the left concerning for developing infection given the patient history.  Signed,  Yvone Neu. Loreta Ave, DO  [facility]  [facility]   Electronically Signed   By: Gilmer Mor D.O.   On: 10/31/2014 07:23    Assessment & Plan by Problem:  ?HCAP with SIRS: Patient presented with increased dyspnea, fever of 102.8, tachycardia with HR in the 120s, and tachypnea found to have a questionable left lower lobe infiltrate on CXR. Due to the language barrier it was difficult to determine how long the patient had been having fevers and increased dyspnea. She had stated her symptoms had been ongoing for 4 weeks. She did report increased yellow sputum production that could be indicative of a pneumonia. She has other reasons for dyspnea including asthma and ILD. She was previously on oxygen at home a few months ago. CXR does not appear to be much different from her most recent hospitalization about 1 week ago. She does have a leukocytosis of 17.8 but she recently completed a prednisone taper and is on chronic steroid therapy. Her lactic acid is normal and BP stable in 120's systolic. Do not suspect sepsis or meningitis at this time. Her most recent hospitalization was for an acute RA flare and I wonder if her fever and associated joint pains are more consistent with another flare. Possible that her previous taper was not long enough. Another possibility is that she could have a viral respiratory illness that is exacerbating her underlying lung disease. She was  empirically started on Vanc and Zosyn in the ED and given a 2 L NS bolus. Will evaluate her tomorrow and determine if further antibiotic treatment is warranted.  - Admit to step down - Start Prednisone 40 mg daily - Duonebs Q4H - Albuterol nebs Q2H PRN - Dulera inhaler BID - Check influenza panel  - Droplet precautions  - f/u blood cultures  - Consider continuing antibiotics in the AM based on clinical picture  - Tylenol PRN fever   SLE/RA: History of multiple autoimmune disorders. 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 She is followed at [facility]. She takes Prednisone 10 mg daily chronically. She was just hospitalized from 4/29-5/2 for an acute RA flare. She was resumed on her home Plaquenil 200 mg BID and Imuran 250 mg daily, and prescribed a Prednisone taper back down to her baseline which she completed. Her main complaint seems to be headache and polyarthralgias. She has significant joint pains on exam in her wrists, finger joints, and knees. There is no significant swelling or erythema present concerning for a septic arthritis. Possible that her fever is related to an acute flare and that the prednisone taper was not long enough. She missed her follow up appt with rheumatology due to transportation issues.  - Start Prednisone 40 mg daily - Continue home Imuran 250 mg daily - Continue home Plaquenil 200 mg BID - Dilaudid 1 mg Q4H PRN pain - Tylenol PRN fever  Asthma: On an albuterol and dulera inhalers at home.  - Albuterol nebs Q2H PRN - Continue Dulera inhaler   Interstitial Lung Disease: Likely related to her connective tissue disease and aggravated by obesity. She saw Dr. Sherene Sires in Feb 2016 and he recommended maintaining prednisone at 10 mg daily.  - Start Prednisone 40 mg daily due to likely acute RA flare and need for stress steroids   Hx PE on Xarelto: PE in May  2015. Patient is on Xarelto 20 mg daily. - Continue home Xarelto   Hypokalemia: K 3.4 on admission. Could be low secondary to albuterol treatments.  - Repeat bmet in AM  Thyroid Nodule: Patient noted to have a 2 cm nodule on her left thyroid gland on CT Angio in Feb 2016. Prior to this, the nodule was noted on [place] at [facility] in Oct 2015 and CT scans as far back as 2010. She has not had appropriate follow up and would benefit from fine needle aspiration of the nodule to rule out malignancy. - Set up outpatient FNA  Microscopic Hematuria: Patient noted to have persistent RBCs on her UA for past several months. UA today is negative for hemoglobin, microscopic evaluation not performed since urine normal.  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 Recommended to have outpatient treatment, but missed her PCP appointment. Denies any bleeding currently.  - Reschedule PCP appt  - Continue to monitor   Diet: Carb modified  VTE PPx: Xarelto Dispo: Disposition is deferred at this time, awaiting improvement of current medical problems. Anticipated discharge in approximately 2-3 day(s).   The patient does have a current PCP (Dessa Phi, MD) and does need an [facility] hospital follow-up appointment after discharge.  The patient does not have transportation limitations that hinder transportation to clinic appointments.  Signed: Su Hoff, MD 10/31/2014, 8:45 AM

## 2014-10-31 NOTE — ED Notes (Signed)
Patient states she has been feeling bad for 2 days.  Stated she noticed a fever yesterday.  Was seen here recently for the same

## 2014-10-31 NOTE — Progress Notes (Signed)
ANTIBIOTIC CONSULT NOTE - INITIAL  Pharmacy Consult for Vancomycin and Zosyn Indication: r/o HCAP  No Known Allergies  Patient Measurements: Height: 5\' 1"  (154.9 cm) Weight: 291 lb 3.6 oz (132.1 kg) IBW/kg (Calculated) : 47.8  Vital Signs: Temp: 98.1 F (36.7 C) (05/07 1600) Temp Source: Oral (05/07 1600) BP: 111/66 mmHg (05/07 1600) Pulse Rate: 103 (05/07 1600) Intake/Output from previous day:   Intake/Output from this shift: Total I/O In: 243 [P.O.:240; I.V.:3] Out: 525 [Urine:525]  Labs:  Recent Labs  10/31/14 0624  WBC 17.8*  HGB 14.0  PLT 255  CREATININE 0.53   Estimated Creatinine Clearance: 133.5 mL/min (by C-G formula based on Cr of 0.53). No results for input(s): VANCOTROUGH, VANCOPEAK, VANCORANDOM, GENTTROUGH, GENTPEAK, GENTRANDOM, TOBRATROUGH, TOBRAPEAK, TOBRARND, AMIKACINPEAK, AMIKACINTROU, AMIKACIN in the last 72 hours.   Microbiology: Recent Results (from the past 720 hour(s))  Urine culture     Status: None   Collection Time: 10/23/14  8:49 AM  Result Value Ref Range Status   Specimen Description URINE, RANDOM  Final   Special Requests Normal  Final   Colony Count   Final    >=100,000 COLONIES/ML Performed at [facility]    Culture   Final    Multiple bacterial morphotypes present, none predominant. Suggest appropriate recollection if clinically indicated. Performed at [facility]    Report Status 10/24/2014 FINAL  Final  Respiratory virus panel     Status: None   Collection Time: 10/23/14  4:03 PM  Result Value Ref Range Status   Respiratory Syncytial Virus A Negative Negative Final   Respiratory Syncytial Virus B Negative Negative Final   Influenza A Negative Negative Final   Influenza B Negative Negative Final   Parainfluenza 1 Negative Negative Final   Parainfluenza 2 Negative Negative Final   Parainfluenza 3 Negative Negative Final   Metapneumovirus Negative Negative Final   Rhinovirus Negative Negative Final    Adenovirus Negative Negative Final    Comment: (NOTE) Performed At: [facility] [address] [place], [place] [id number] MD Mila Homer   MRSA PCR Screening     Status: None   Collection Time: 10/31/14 12:Donna PM  Result Value Ref Range Status   MRSA by PCR NEGATIVE NEGATIVE Final    Comment:        The GeneXpert MRSA Assay (FDA approved for NASAL specimens only), is one component of a comprehensive MRSA colonization surveillance program. It is not intended to diagnose MRSA infection nor to guide or monitor treatment for MRSA infections.     Medical History: Past Medical History  Diagnosis Date  . Psoriasis 2010    as a child  . Chronic pain disorder   . Obesity   . Chronic steroid use 2010  . Lupus 2000  . Lupus (systemic lupus erythematosus) 2010  . Rheumatoid arthritis(714.0) 2010  . Hypertension 2010  . Pneumonia "several times"  . Interstitial lung disease     /notes 05/15/2014  . On home oxygen therapy     "2L; 24/7" (05/15/2014)  . Pulmonary embolism     hx/notes 05/15/2014  . History of blood transfusion     "because white cells ate the red cells"  . Headache     "just when I have the pain from aching bones and psorasis and/or lupus symptoms" (05/15/2104)  . 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 Asthma   . Gestational diabetes  2006  . Depression   . Sleep apnea     Medications:  Prescriptions prior to admission  Medication Sig Dispense Refill Last Dose  . albuterol (PROVENTIL HFA;VENTOLIN HFA) 108 (90 BASE) MCG/ACT inhaler Inhale 2 puffs into the lungs every 4 (four) hours as needed for wheezing or shortness of breath. 8 g 11 10/30/2014 at Unknown time  . azaTHIOprine (IMURAN) 50 MG tablet Take 5 tablets (250 mg total) by mouth daily. 150 tablet 3 10/30/2014 at Unknown time  . diphenhydrAMINE (BENADRYL) 2 % cream Apply topically 3 (three) times daily as needed for  itching. 60 g 0 10/30/2014 at Unknown time  . hydroxychloroquine (PLAQUENIL) 200 MG tablet Take 1 tablet (200 mg total) by mouth 2 (two) times daily. 60 tablet 3 10/30/2014 at Unknown time  . hydrOXYzine (ATARAX/VISTARIL) 10 MG tablet Take 1 tablet (10 mg total) by mouth every 8 (eight) hours as needed for itching. 90 tablet 5 10/30/2014 at Unknown time  . mometasone-formoterol (DULERA) 100-5 MCG/ACT AERO Take 2 puffs first thing in am and then another 2 puffs about 12 hours later. (Patient taking differently: Inhale 1 puff into the lungs 2 (two) times daily. )   10/30/2014 at Unknown time  . nystatin (MYCOSTATIN) 100000 UNIT/ML suspension Take 5 mLs (500,000 Units total) by mouth 4 (four) times daily. 240 mL 0 10/30/2014 at Unknown time  . oxyCODONE-acetaminophen (PERCOCET) 10-325 MG per tablet Take 1 tablet by mouth every 4 (four) hours as needed for pain. 30 tablet 0 10/30/2014 at Unknown time  . predniSONE (DELTASONE) 10 MG tablet Take 1-4 tablets (10-40 mg total) by mouth daily with breakfast. 40 mg for 2 days, then 20 mg for 2 days, then 10 mg daily. 50 tablet 0 10/30/2014 at Unknown time  . rivaroxaban (XARELTO) 20 MG TABS tablet Take 1 tablet (20 mg total) by mouth daily with supper. 30 tablet 7 10/30/2014 at Unknown time   Assessment: 29 y.o. Hall presents with. To begin broad spectrum antibiotics (Vancomycin and Zosyn) for r/o HCAP in immunocompromised patient. Patient was recently hospitalized from 4/29-5/2 for an acute RA flare. Vancomycin 1gm IV ~0830 and Zosyn 3.375gm IV ~0800 given in ED. Estimated normalized CrCl >100 ml/min. WBC up to 17.8. WBC 102.8, Afeb currently.  Goal of Therapy:  Vancomycin trough level 15-20 mcg/ml  Plan:  Zosyn 3.375gm IV q8h - each dose over 4 hours Vancomycin 1500mg  IV now then 1gm IV q8h Will f/u renal function, pt's clinical condition, and micro data VT at Css in obese pt  , PharmD, BCPS Clinical pharmacist, pager [phone] 10/31/2014,5:25 PM

## 2014-11-01 DIAGNOSIS — R0602 Shortness of breath: Secondary | ICD-10-CM | POA: Diagnosis not present

## 2014-11-01 DIAGNOSIS — J189 Pneumonia, unspecified organism: Secondary | ICD-10-CM | POA: Diagnosis not present

## 2014-11-01 LAB — BASIC METABOLIC PANEL
ANION GAP: 7 (ref 5–15)
BUN: 5 mg/dL — AB (ref 6–20)
CHLORIDE: 99 mmol/L — AB (ref 101–111)
CO2: 31 mmol/L (ref 22–32)
Calcium: 8.6 mg/dL — ABNORMAL LOW (ref 8.9–10.3)
Creatinine, Ser: 0.45 mg/dL (ref 0.44–1.00)
Glucose, Bld: 137 mg/dL — ABNORMAL HIGH (ref 70–99)
Potassium: 4.7 mmol/L (ref 3.5–5.1)
SODIUM: 137 mmol/L (ref 135–145)

## 2014-11-01 LAB — URINE CULTURE: Colony Count: 75000

## 2014-11-01 LAB — CBC
HEMATOCRIT: 42.6 % (ref 36.0–46.0)
HEMOGLOBIN: 12.6 g/dL (ref 12.0–15.0)
MCH: 25.8 pg — AB (ref 26.0–34.0)
MCHC: 29.6 g/dL — AB (ref 30.0–36.0)
MCV: 87.1 fL (ref 78.0–100.0)
Platelets: 264 10*3/uL (ref 150–400)
RBC: 4.89 MIL/uL (ref 3.87–5.11)
RDW: 16 % — ABNORMAL HIGH (ref 11.5–15.5)
WBC: 15.1 10*3/uL — ABNORMAL HIGH (ref 4.0–10.5)

## 2014-11-01 LAB — TROPONIN I: Troponin I: 0.04 ng/mL — ABNORMAL HIGH (ref <0.031)

## 2014-11-01 MED ORDER — HYDROMORPHONE HCL 1 MG/ML IJ SOLN
1.5000 mg | INTRAMUSCULAR | Status: DC | PRN
  Administered 2014-11-01 – 2014-11-02 (×7): 2 mg via INTRAVENOUS
  Filled 2014-11-01 (×7): qty 2

## 2014-11-01 MED ORDER — OXYCODONE-ACETAMINOPHEN 5-325 MG PO TABS
2.0000 | ORAL_TABLET | Freq: Four times a day (QID) | ORAL | Status: DC | PRN
  Administered 2014-11-01 – 2014-11-03 (×4): 2 via ORAL
  Filled 2014-11-01 (×4): qty 2

## 2014-11-01 MED ORDER — PREDNISONE 50 MG PO TABS
60.0000 mg | ORAL_TABLET | Freq: Every day | ORAL | Status: DC
  Administered 2014-11-02 – 2014-11-04 (×3): 60 mg via ORAL
  Filled 2014-11-01 (×4): qty 1

## 2014-11-01 MED ORDER — PREDNISONE 20 MG PO TABS
20.0000 mg | ORAL_TABLET | Freq: Once | ORAL | Status: AC
  Administered 2014-11-01: 20 mg via ORAL
  Filled 2014-11-01: qty 1

## 2014-11-01 NOTE — Progress Notes (Signed)
Subjective: Remained afebrile overnight. Tachycardia has improved, HR now in 80s. Patient reports she is still having a headache and significant joint pains in her wrists, fingers, and knees. She notes her dyspnea has improved but she is still coughing occasionally.   Objective: Vital signs in last 24 hours: Filed Vitals:   11/01/14 0322 11/01/14 0622 11/01/14 0800 11/01/14 0841  BP:  121/45 118/69   Pulse:  93    Temp:  98.2 F (36.8 C) 98.6 F (37 C)   TempSrc:  Oral Oral   Resp:  13    Height:      Weight:      SpO2: 97% 97%  96%   Weight change: -11 lb 14.3 oz (-5.394 kg)  Intake/Output Summary (Last 24 hours) at 11/01/14 0926 Last data filed at 11/01/14 0218  Gross per 24 hour  Intake   1313 ml  Output   1625 ml  Net   -312 ml   Physical Exam General: young obese woman resting in bed, NAD HEENT: [place]/AT, EOMI, mucus membranes moist CV: RRR, no m/g/r Pulm: bilateral wheezing heard anteriorly, breaths non-labored on 3 L oxygen via [place] Abd: BS+, obese, soft, non-tender  Ext: wrists are warm bilaterally. Tenderness to palpation of bilateral wrist, finger, and knee joints. No significant erythema or swelling present.  Neuro: alert and oriented x 3  Lab Results: Basic Metabolic Panel:  Recent Labs Lab 10/31/14 0624 11/01/14 0317  NA 135 137  K 3.4* 4.7  CL 99* 99*  CO2 27 31  GLUCOSE 94 137*  BUN 7 5*  CREATININE 0.53 0.45  CALCIUM 8.4* 8.6*   CBC:  Recent Labs Lab 10/31/14 0624 11/01/14 0317  WBC 17.8* 15.1*  NEUTROABS 14.3*  --   HGB 14.0 12.6  HCT 44.7 42.6  MCV 84.5 87.1  PLT 255 264   CBG:  Recent Labs Lab 10/26/14 0739  GLUCAP 96   Urinalysis:  Recent Labs Lab 10/31/14 0815  COLORURINE YELLOW  LABSPEC 1.019  PHURINE 5.5  GLUCOSEU NEGATIVE  HGBUR NEGATIVE  BILIRUBINUR NEGATIVE  KETONESUR NEGATIVE  PROTEINUR NEGATIVE  UROBILINOGEN 0.2  NITRITE NEGATIVE  LEUKOCYTESUR NEGATIVE   Micro Results: Blood cx 5/7>> NGTD Influenza  panel 5/7>> negative   Studies/Results: Dg Chest 2 View  10/31/2014   CLINICAL DATA:  Cough, fever, shortness of breath and wheezing for several months.  EXAM: CHEST  2 VIEW  COMPARISON:  Earlier today.  FINDINGS: Poor inspiration. Normal sized heart. Diffuse peribronchial thickening and prominence of the interstitial markings without significant change. Mild left lower lobe airspace opacity. Unremarkable bones.  IMPRESSION: 1. Mild left lower lobe pneumonia or patchy atelectasis. 2. Stable bronchitic changes and chronic interstitial lung disease.   Electronically Signed   By: Beckie Salts M.D.   On: 10/31/2014 09:53   Dg Chest Port 1 View  10/31/2014   CLINICAL DATA:  29 year old female with a history of productive cough  EXAM: PORTABLE CHEST - 1 VIEW  COMPARISON:  Multiple prior, most recent plain film 10/23/2014. Prior CT 07/29/2014  FINDINGS: Low lung volumes accentuates the interstitium.  Increasing interstitial opacities of the left lung.  Apical lordotic positioning.  Cardiomediastinal silhouette unchanged.  No large pleural effusion or pneumothorax.  No displaced fracture.  IMPRESSION: Low lung volumes, with increasing opacities on the left concerning for developing infection given the patient history.  Signed,  Yvone Neu. Loreta Ave, DO  [facility]  [facility]   Electronically Signed  By: Gilmer Mor D.O.   On: 10/31/2014 07:23   Medications: I have reviewed the patient's current medications.  Assessment/Plan:  ?HCAP with SIRS: CXR with possible new LLL infiltrate. Patient has remained afebrile and tachycardia resolved. Her dyspnea has improved, but she does have significant wheezing and a productive cough. Influenza negative. Blood cx are NGTD. Will continue antibiotics as HCAP cannot be ruled out.  - Continue Vanc and Zosyn, transition to PO Levaquin tomorrow if doing well  - Continue Prednisone 40 mg daily - Duonebs Q4H - Albuterol nebs Q2H  PRN - Dulera inhaler BID - Tylenol PRN fever   SLE/RA Flare?: Patient still with significant joint pains in her wrists, fingers, and knees. No erythema or swelling. She is also complaining of a headache. Possibly she is having a lupus/RA flare? She was sent home on Prednisone taper 1 week ago but may not have been a long enough taper. Will focus on pain control.  - Continue Prednisone 40 mg daily - Continue home Imuran 250 mg daily - Continue home Plaquenil 200 mg BID - Check dsDNA - Check EKG and troponin with possible acute flare  - Change Dilaudid 1.5-2 mg Q3H PRN pain - Change Percocet to 10 mg Q6H PRN - Tylenol PRN fever  Asthma: On an albuterol and dulera inhalers at home.  - Albuterol nebs Q2H PRN and Duonebs Q4H - Continue Dulera inhaler   Interstitial Lung Disease: Likely related to her connective tissue disease and aggravated by obesity. She saw Dr. Sherene Sires in Feb 2016 and he recommended maintaining prednisone at 10 mg daily.  - Continue Prednisone 40 mg daily   Hx PE on Xarelto: PE in May 2015. Patient is on Xarelto 20 mg daily. - Continue home Xarelto   Hypokalemia: K 3.4 on admission, now 4.7 today.   - bmet in AM  Thyroid Nodule: Patient noted to have a 2 cm nodule on her left thyroid gland on CT Angio in Feb 2016. Prior to this, the nodule was noted on [place] at [facility] in Oct 2015 and CT scans as far back as 2010. She has not had appropriate follow up and would benefit from fine needle aspiration of the nodule to rule out malignancy. - Set up outpatient FNA  Microscopic Hematuria: Patient noted to have persistent RBCs on her UA for past several months. UA today is negative for hemoglobin, microscopic evaluation not performed since urine normal.  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 Recommended to have outpatient treatment, but missed her PCP appointment. Denies any bleeding currently.  -  Reschedule PCP appt  - Continue to monitor   Diet: Carb modified  VTE PPx: Xarelto Dispo: Disposition is deferred at this time, awaiting improvement of current medical problems.  Anticipated discharge in approximately 2 day(s).   The patient does have a current PCP (Dessa Phi, MD) and does need an [facility] hospital follow-up appointment after discharge.  The patient does not have transportation limitations that hinder transportation to clinic appointments.  .Services Needed at time of discharge: Y = Yes, Blank = No PT:   OT:   RN:   Equipment:   Other:     LOS: 1 day   Su Hoff, MD 11/01/2014, 9:26 AM

## 2014-11-02 LAB — BASIC METABOLIC PANEL
Anion gap: 8 (ref 5–15)
BUN: 8 mg/dL (ref 6–20)
CALCIUM: 8.6 mg/dL — AB (ref 8.9–10.3)
CHLORIDE: 99 mmol/L — AB (ref 101–111)
CO2: 31 mmol/L (ref 22–32)
CREATININE: 0.53 mg/dL (ref 0.44–1.00)
GFR calc Af Amer: >60 mL/min (ref >60)
GFR calc non Af Amer: >60 mL/min (ref >60)
GLUCOSE: 113 mg/dL — AB (ref 70–99)
POTASSIUM: 4.5 mmol/L (ref 3.5–5.1)
Sodium: 138 mmol/L (ref 135–145)

## 2014-11-02 LAB — LEGIONELLA ANTIGEN, URINE

## 2014-11-02 LAB — ANTI-DNA ANTIBODY, DOUBLE-STRANDED: ds DNA Ab: 1 IU/mL (ref 0–9)

## 2014-11-02 MED ORDER — LEVOFLOXACIN 750 MG PO TABS
750.0000 mg | ORAL_TABLET | Freq: Every day | ORAL | Status: DC
  Administered 2014-11-02 – 2014-11-04 (×3): 750 mg via ORAL
  Filled 2014-11-02 (×3): qty 1

## 2014-11-02 MED ORDER — IPRATROPIUM-ALBUTEROL 0.5-2.5 (3) MG/3ML IN SOLN
3.0000 mL | Freq: Four times a day (QID) | RESPIRATORY_TRACT | Status: DC
  Administered 2014-11-03 – 2014-11-04 (×5): 3 mL via RESPIRATORY_TRACT
  Filled 2014-11-02 (×7): qty 3

## 2014-11-02 MED ORDER — HYDROMORPHONE HCL 1 MG/ML IJ SOLN
1.5000 mg | INTRAMUSCULAR | Status: DC | PRN
  Administered 2014-11-02 – 2014-11-03 (×3): 2 mg via INTRAVENOUS
  Filled 2014-11-02 (×3): qty 2

## 2014-11-02 MED ORDER — KETOROLAC TROMETHAMINE 30 MG/ML IJ SOLN
30.0000 mg | Freq: Four times a day (QID) | INTRAMUSCULAR | Status: DC | PRN
  Administered 2014-11-02 – 2014-11-03 (×3): 30 mg via INTRAVENOUS
  Filled 2014-11-02 (×4): qty 1

## 2014-11-02 NOTE — Care Management Note (Addendum)
Case Management Note  Patient Details  Name: Donna Hall MRN: [id number] Date of Birth: [birth year]  Subjective/Objective:                    Action/Plan: Stacey at [facility] for [facility] 1 [phone] returned call . For transportation assistance patient will have to call [facility] / [employer] [phone] . Misty Stanley will mail patient orange card application and call her post discharge to arrange appointment .     Left a voice mail with Lillie Columbia at [facility] for [facility] 1 [phone] , awaiting call back .   Can assist patient with MATCH letter on day of discharge .   Expected Discharge Date:  11/03/14               Expected Discharge Plan:  Home/Self Care  In-House Referral:  Clinical Social Work  Discharge planning Services  CM Consult  Post Acute Care Choice:    Choice offered to:     DME Arranged:    DME Agency:     HH Arranged:    HH Agency:     Status of Service:  In process, will continue to follow  Medicare Important Message Given:    Date Medicare IM Given:    Medicare IM give by:    Date Additional Medicare IM Given:    Additional Medicare Important Message give by:     If discussed at Long Length of Stay Meetings, dates discussed:    Additional Comments:  Kingsley Plan, RN 11/02/2014, 2:16 PM

## 2014-11-02 NOTE — Progress Notes (Signed)
Pre Peak Flow 120 Post Peak Flow 140 Good patient effort.

## 2014-11-02 NOTE — Progress Notes (Signed)
Subjective:    Currently, the patient reports continued pain all over her body that is worst in her head and left hand.  The pain is focused across her forehead, and she is unable to close her left hand.  She is asking for more pain medication.  She reports that her breathing is improved since being in the hospital. She denies any chest pain currently.  She was on oxygen at home a year ago, but she has not needed it recently.  Interval Events: -Afebrile overnight with stable vital signs. -Unable to be weaned off of supplemental oxygen.    Objective:    Vital Signs:   Temp:  [97.5 F (36.4 C)-98.6 F (37 C)] 97.9 F (36.6 C) (05/09 0822) Pulse Rate:  [85-110] 94 (05/09 0822) Resp:  [13-26] 22 (05/09 0822) BP: (105-132)/(58-70) 105/59 mmHg (05/09 0822) SpO2:  [91 %-97 %] 95 % (05/09 0822) Last BM Date: 11/01/14  24-hour weight change: Weight change:   Intake/Output:   Intake/Output Summary (Last 24 hours) at 11/02/14 0823 Last data filed at 11/02/14 0700  Gross per 24 hour  Intake    270 ml  Output   1800 ml  Net  -1530 ml      Physical Exam: General: Obese. Well-developed, well-nourished, alert, appropriate and cooperative throughout examination.  Tears up during conversation.  Lungs:  Bilateral wheezing, normal respiratory effort.  Heart: RRR. S1 and S2 normal without gallop, murmur, or rubs.  Abdomen:  BS normoactive. Soft, Nondistended, non-tender.  No masses or organomegaly.  Extremities: Tenderness to palpation of arms and hands bilaterally, no edema.     Labs:  Basic Metabolic Panel:  Recent Labs Lab 10/31/14 0624 11/01/14 0317 11/02/14 0310  NA 135 137 138  K 3.4* 4.7 4.5  CL 99* 99* 99*  CO2 27 31 31   GLUCOSE 94 137* 113*  BUN 7 5* 8  CREATININE 0.53 0.45 0.53  CALCIUM 8.4* 8.6* 8.6*   CBC:  Recent Labs Lab 10/31/14 0624 11/01/14 0317  WBC 17.8* 15.1*  NEUTROABS 14.3*  --   HGB 14.0 12.6  HCT 44.7 42.6  MCV 84.5 87.1  PLT 255 264     Cardiac Enzymes:  Recent Labs Lab 11/01/14 1020  TROPONINI 0.04*   Microbiology: Results for orders placed or performed during the hospital encounter of 10/31/14  Culture, blood (routine x 2)     Status: None (Preliminary result)   Collection Time: 10/31/14  5:52 AM  Result Value Ref Range Status   Specimen Description BLOOD RIGHT ARM  Final   Special Requests BOTTLES DRAWN AEROBIC ONLY 10CC  Final   Culture   Final           BLOOD CULTURE RECEIVED NO GROWTH TO DATE CULTURE WILL BE HELD FOR 5 DAYS BEFORE ISSUING A FINAL NEGATIVE REPORT Performed at 12/31/14    Report Status PENDING  Incomplete  Culture, blood (routine x 2)     Status: None (Preliminary result)   Collection Time: 10/31/14  6:04 AM  Result Value Ref Range Status   Specimen Description BLOOD LEFT FOREARM  Final   Special Requests BOTTLES DRAWN AEROBIC ONLY 9CC  Final   Culture   Final           BLOOD CULTURE RECEIVED NO GROWTH TO DATE CULTURE WILL BE HELD FOR 5 DAYS BEFORE ISSUING A FINAL NEGATIVE REPORT Performed at 12/31/14    Report Status PENDING  Incomplete  Urine culture  Status: None   Collection Time: 10/31/14  8:15 AM  Result Value Ref Range Status   Specimen Description URINE, RANDOM  Final   Special Requests NONE  Final   Colony Count   Final    75,000 COLONIES/ML Performed at [facility]    Culture   Final    Multiple bacterial morphotypes present, none predominant. Suggest appropriate recollection if clinically indicated. Performed at [facility]    Report Status 11/01/2014 FINAL  Final  MRSA PCR Screening     Status: None   Collection Time: 10/31/14 12:23 PM  Result Value Ref Range Status   MRSA by PCR NEGATIVE NEGATIVE Final    Comment:        The GeneXpert MRSA Assay (FDA approved for NASAL specimens only), is one component of a comprehensive MRSA colonization surveillance program. It is not intended to diagnose MRSA infection nor to  guide or monitor treatment for MRSA infections.    Imaging: Dg Chest 2 View  10/31/2014   CLINICAL DATA:  Cough, fever, shortness of breath and wheezing for several months.  EXAM: CHEST  2 VIEW  COMPARISON:  Earlier today.  FINDINGS: Poor inspiration. Normal sized heart. Diffuse peribronchial thickening and prominence of the interstitial markings without significant change. Mild left lower lobe airspace opacity. Unremarkable bones.  IMPRESSION: 1. Mild left lower lobe pneumonia or patchy atelectasis. 2. Stable bronchitic changes and chronic interstitial lung disease.   Electronically Signed   By: Beckie Salts M.D.   On: 10/31/2014 09:53       Medications:    Infusions:    Scheduled Medications: . azaTHIOprine  250 mg Oral Daily  . hydroxychloroquine  200 mg Oral BID  . ipratropium-albuterol  3 mL Nebulization Q4H  . mometasone-formoterol  1 puff Inhalation BID  . nystatin  5 mL Oral QID  . piperacillin-tazobactam (ZOSYN)  IV  3.375 g Intravenous 3 times per day  . predniSONE  60 mg Oral Q breakfast  . rivaroxaban  20 mg Oral Q supper  . sodium chloride  3 mL Intravenous Q12H  . vancomycin  1,000 mg Intravenous Q8H    PRN Medications: acetaminophen, albuterol, HYDROmorphone (DILAUDID) injection, hydrOXYzine, oxyCODONE-acetaminophen   Assessment/ Plan:    Active Problems:   INTERSTITIAL LUNG DISEASE   Long term current use of systemic steroids   Obesity, Class III, BMI 40-49.9 (morbid obesity)   Systemic lupus erythematosus   Headache   Acute dyspnea   Moderate obstructive sleep apnea   Total body pain   Rheumatoid arthritis flare   HCAP (healthcare-associated pneumonia)   Polyarthritis   SOB (shortness of breath)  #HCAP Reason is improved, the patient continues to require supplemental oxygen. This is likely in large part due to her underlying asthma and interstitial lung disease. Her troponin is slightly above the upper cutoff for normal. However, she denies any chest  pain, and her EKG is unremarkable for any evidence of ischemia. -No further workup for ACS. -Switch from vancomycin/Zosyn to oral Levaquin, day 3 of 7. -Treat asthma as below. -Stop Tylenol as she is getting acetaminophen in her Percocet.  #?SLE/RA flare Compliance with her medications appears to be a major issue. Diagnosis remains unclear with anti-dsDNA negative in the past and no evidence of RA on previous x-rays.  Possibility of fibromyalgia has been raised in the past, and she was started on Cymbalta, but she says that it did not help. She continues to be in significant pain asking for more  Dilaudid, but she is receiving a large amount of opioids and will need to transition to PO to go home. Since her pain may be caused by inflammation, we will add on Toradol and attempt to move away from Dilaudid. She will need to ambulate for her pain to improve.  She will need to follow up with rheumatology as an outpatient. -Start Toradol 30 mg every 6 hours as needed. -Continue Percocet 5-325, 2 tabs every 6 hours as needed. -Reduce Dilaudid 1.5-2 mg to every 4 hours as needed. -Discussed with nurse and asked to give oral pain medications and Toradol first prior to Dilaudid. -Follow-up anti-double-stranded DNA antibody. -Continue prednisone 40 mg daily. -Continue home Imuran 250 mg daily. -Continue home plaque on 200 mg twice a day. -Consult PT and OT. -Outpatient rheumatology follow up. -Consult SW and care management will attempt to set up with [facility] to help with transportation.  #Asthma/interstitial lung disease She continues to have diffuse wheezing on exam, this is likely contributing to her hypoxia. She follows with Dr. Sherene Sires who has concerns about compliance. -Monitor peak expiratory flow. -Continue DuoNeb's every 4 hours. -Continue albuterol nebulizers every 2 hours as needed. -Continue home Dulera. -Prednisone as above.  #History of PE -Continue home Xarelto 20 g  daily.  #Hypokalemia Resolved. -Continue to monitor.  #Thyroid nodule -Needs outpatient FNA.   DVT PPX - Xarelto.  CODE STATUS - Full.  CONSULTS PLACED - None.  DISPO - Disposition is deferred at this time, awaiting improvement of pneumonia and pain.   Anticipated discharge in approximately 1-3 day(s).   The patient does have a current PCP ([facility], Ellison Carwin, MD) and does not need an [facility] hospital follow-up appointment after discharge.    Is the [facility] hospital follow-up appointment a one-time only appointment? not applicable.  Does the patient have transportation limitations that hinder transportation to clinic appointments? yes   SERVICE NEEDED AT DISCHARGE - TO BE DETERMINED DURING HOSPITAL COURSE         Y = Yes, Blank = No PT:   OT:   RN:   Equipment:   Other:      Length of Stay: 2 day(s)   Signed: Donavan Foil, MD  PGY-1, Internal Medicine Resident Pager: [phone] (7AM-5PM) 11/02/2014, 8:23 AM

## 2014-11-02 NOTE — Progress Notes (Signed)
Report called to nurse on 6N. Will transfer as soon as possible.

## 2014-11-02 NOTE — Progress Notes (Signed)
  Date: 11/02/2014  Patient name: Donna Hall  Medical record number: [id number]  Date of birth: 24-Jan-1986   This patient has been seen and the plan of care was discussed with the house staff. Please see their note for complete details. I concur with their findings with the following additions/corrections: Ms [name] cont to be perplexing. 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 She does have + ANA, RF, CCP, anti SSA (can be seen in inflamm myopathies). I am not clear as to her exact rheum dx but rheum has been tx her with azathioprine, hydroxychloroquine, prednisone and had been on TNF alpha in past. We are cont those (increased prednisone dose but not seeming to control pain - again confirming that pain is not entirely from rheum disease). She has not been able to travel to the [facility] clinic for the close F/U that she needs. Her pain is the main issue this hospitalization and the last. I am hesitant to get palliative care involved for pain mgmt as the regimen will then be left to her PCP to Rx, who may not agree with the regimen. I agree with Dr Glenard Haring to limit opioids, start NSAID, and get PT.   What she really needs is a health care navigator to improve her compliance. Notes indicate that she is illiterate in [place]. We have contacted care mgmt to refer pt to [facility].  Burns Spain, MD 11/02/2014, 3:33 PM

## 2014-11-02 NOTE — Discharge Instructions (Addendum)
-  Es Absolutamente crtico que usted asiste a su cita con el mdico el viernes . Yo slo le he proporcionado suficiente medicamento para el dolor para llegar a esta cita. -Es muy importante que usted recoja su inhalador Dulera para prevenir futuros ataques de asma. -Asegrese De que complete todas sus antibiotcs. Va a acabar con ellos en 5.13.16. -Su dolor es probablemente provocada por una condicin llamada fibromialgia . He incluido algunas informaciones sobre esta condicin a continuacin. Ser fundamental que aumente su actividad y perder peso para reducir Chief Technology Officer. -Asegrese de revisar la lista de medicamentos adjunto. Recoge todas las recetas y tomar los medicamentos segn las indicaciones. -[facility] (1 [phone]) pondr en contacto con usted con una cita.  Ellos pueden ayudarle a tratar mejor a sus problemas de salud y cuidado de la salud de Teacher, adult education. -Para la llamada asistencia de transporte: [employer] / [employer] [phone]).

## 2014-11-03 LAB — CBC WITH DIFFERENTIAL/PLATELET
BASOS PCT: 0 % (ref 0–1)
Basophils Absolute: 0 10*3/uL (ref 0.0–0.1)
EOS PCT: 1 % (ref 0–5)
Eosinophils Absolute: 0.1 10*3/uL (ref 0.0–0.7)
HCT: 42.1 % (ref 36.0–46.0)
Hemoglobin: 12.2 g/dL (ref 12.0–15.0)
LYMPHS ABS: 2.1 10*3/uL (ref 0.7–4.0)
Lymphocytes Relative: 24 % (ref 12–46)
MCH: 25.6 pg — ABNORMAL LOW (ref 26.0–34.0)
MCHC: 29 g/dL — ABNORMAL LOW (ref 30.0–36.0)
MCV: 88.3 fL (ref 78.0–100.0)
MONO ABS: 0.8 10*3/uL (ref 0.1–1.0)
MONOS PCT: 9 % (ref 3–12)
Neutro Abs: 5.8 10*3/uL (ref 1.7–7.7)
Neutrophils Relative %: 66 % (ref 43–77)
PLATELETS: 330 10*3/uL (ref 150–400)
RBC: 4.77 MIL/uL (ref 3.87–5.11)
RDW: 16.6 % — ABNORMAL HIGH (ref 11.5–15.5)
WBC: 8.8 10*3/uL (ref 4.0–10.5)

## 2014-11-03 LAB — EXPECTORATED SPUTUM ASSESSMENT W GRAM STAIN, RFLX TO RESP C

## 2014-11-03 LAB — EXPECTORATED SPUTUM ASSESSMENT W REFEX TO RESP CULTURE

## 2014-11-03 MED ORDER — OXYCODONE-ACETAMINOPHEN 5-325 MG PO TABS
2.0000 | ORAL_TABLET | ORAL | Status: DC | PRN
  Administered 2014-11-03 – 2014-11-04 (×6): 2 via ORAL
  Filled 2014-11-03 (×6): qty 2

## 2014-11-03 MED ORDER — DM-GUAIFENESIN ER 30-600 MG PO TB12
1.0000 | ORAL_TABLET | Freq: Two times a day (BID) | ORAL | Status: DC | PRN
  Administered 2014-11-03: 1 via ORAL
  Filled 2014-11-03 (×3): qty 1

## 2014-11-03 MED ORDER — GABAPENTIN 100 MG PO CAPS
100.0000 mg | ORAL_CAPSULE | Freq: Three times a day (TID) | ORAL | Status: DC
  Administered 2014-11-03: 100 mg via ORAL
  Filled 2014-11-03 (×3): qty 1

## 2014-11-03 MED ORDER — NAPROXEN 250 MG PO TABS
500.0000 mg | ORAL_TABLET | Freq: Two times a day (BID) | ORAL | Status: DC
  Administered 2014-11-03 – 2014-11-04 (×4): 500 mg via ORAL
  Filled 2014-11-03 (×4): qty 2

## 2014-11-03 NOTE — Evaluation (Addendum)
Occupational Therapy Evaluation Patient Details Name: Donna Hall MRN: [id number] DOB: 1985-07-16 Today's Date: 11/03/2014    History of Present Illness Pt is a 29yo woman with HCAP. PMHx of SLE, rheumatoid arthritis, asthma, PE on xarelto, interstitial lung disease, and depression who presents to the ED with fever and dyspnea. Patient was hospitalized from 4/29-5/2 for an acute RA flare.    Clinical Impression   Pt admitted with the above diagnoses and presents with below problem list. Pt will benefit from continued acute OT to address the below listed deficits and maximize independence with BADLs prior to d/c home. PTA pt was independent most of the time but reports needing some assistance when her pain level was high. Pt is currently at min guard to min A level for ADLs. Pain a limiting factor this session. OT to continue to follow acutely.     Follow Up Recommendations  No OT follow up;Supervision - Intermittent;Other (comment) (OOB/mobility)    Equipment Recommendations  3 in 1 bedside comode; shower seat    Recommendations for Other Services       Precautions / Restrictions Restrictions Weight Bearing Restrictions: No      Mobility Bed Mobility Overal bed mobility: Needs Assistance Bed Mobility: Supine to Sit     Supine to sit: Min assist     General bed mobility comments: Pt needing assist to advance trunk due to decreased use of BUEs with pain a limiting factor this session.  Transfers Overall transfer level: Needs assistance   Transfers: Sit to/from Stand Sit to Stand: Min guard;From elevated surface         General transfer comment: min guard from EOB    Balance Overall balance assessment: Needs assistance         Standing balance support: No upper extremity supported;During functional activity Standing balance-Leahy Scale: Fair Standing balance comment: stood without assist and ambulated 4-5 steps with no LOB, wide BOS                             ADL Overall ADL's : Needs assistance/impaired Eating/Feeding: Set up;Sitting   Grooming: Set up;Sitting   Upper Body Bathing: Minimal assitance;Sitting   Lower Body Bathing: Minimal assistance;Sit to/from stand;With adaptive equipment   Upper Body Dressing : Minimal assistance;Sitting   Lower Body Dressing: Minimal assistance;With adaptive equipment;Sit to/from stand   Toilet Transfer: Min guard;Ambulation;BSC Toilet Transfer Details (indicate cue type and reason): ambulated 4-5 steps this session Toileting- Clothing Manipulation and Hygiene: Min guard;With adaptive equipment;Sit to/from stand   Tub/ Shower Transfer: Min guard;Ambulation;3 in 1   Functional mobility during ADLs: Min guard General ADL Comments: Pt completed supine>EOB with min A to power up trunk. Pt ambulated 4-5 steps only this session at min guard level. Pt reports ambulating to the bathroom earlier today and "almost fell" due to urgency/incontinence. Educated on energy conservation and breathing techniques.      Vision     Perception     Praxis      Pertinent Vitals/Pain Pain Assessment: 0-10 Pain Score: 10-Worst pain ever Pain Location: "all over" Pain Intervention(s): Limited activity within patient's tolerance;Monitored during session;Premedicated before session;Repositioned;Utilized relaxation techniques     Hand Dominance     Extremity/Trunk Assessment Upper Extremity Assessment Upper Extremity Assessment:  (difficult to assess due to pain, limited UE use during sessi)   Lower Extremity Assessment Lower Extremity Assessment: Defer to PT evaluation       Communication Communication  Communication: Prefers language other than [place];Interpreter utilized;Other (comment) (Spanish, understands and speaks some basic [place])   Cognition Arousal/Alertness: Awake/alert Behavior During Therapy: WFL for tasks assessed/performed Overall Cognitive Status: Within Functional  Limits for tasks assessed                     General Comments       Exercises       Shoulder Instructions      Home Living Family/patient expects to be discharged to:: Private residence Living Arrangements: Spouse/significant other;Children Available Help at Discharge: Family;Other (Comment) (spouse works, sister-in-law helps out) Type of Home: Apartment Home Access: Stairs to enter [employer] of Steps: 2 Entrance Stairs-Rails: None Home Layout: One level     Bathroom Shower/Tub: Tub/shower unit         Home Equipment: None          Prior Functioning/Environment Level of Independence: Independent;Needs assistance  Gait / Transfers Assistance Needed: pt reports independent with ambulation ADL's / Homemaking Assistance Needed: pt reports independent most of the time but when her pain is high her spouse helps some with bathing/dressing        OT Diagnosis: Generalized weakness;Acute pain   OT Problem List: Decreased strength;Decreased range of motion;Decreased activity tolerance;Impaired balance (sitting and/or standing);Decreased coordination;Decreased knowledge of use of DME or AE;Decreased knowledge of precautions;Impaired UE functional use;Pain   OT Treatment/Interventions: Self-care/ADL training;Therapeutic exercise;Energy conservation;DME and/or AE instruction;Therapeutic activities;Patient/family education;Balance training    OT Goals(Current goals can be found in the care plan section) Acute Rehab OT Goals Patient Stated Goal: not stated OT Goal Formulation: With patient Time For Goal Achievement: 11/10/14 Potential to Achieve Goals: Good ADL Goals Pt Will Perform Upper Body Bathing: Independently;sitting Pt Will Perform Lower Body Bathing: sit to/from stand;Independently;with adaptive equipment Pt Will Perform Upper Body Dressing: Independently;sitting Pt Will Perform Lower Body Dressing: Independently;sit to/from stand Pt Will  Transfer to Toilet: Independently;ambulating (3n1 over toilet) Pt Will Perform Tub/Shower Transfer: Tub transfer;3 in 1;ambulating  OT Frequency: Min 2X/week   Barriers to D/C:            Co-evaluation              End of Session Equipment Utilized During Treatment: Oxygen Nurse Communication: Mobility status;Other (comment) (dizziness after transfer, vitals)  Activity Tolerance: Patient limited by pain;Patient tolerated treatment well Patient left: in chair;with call bell/phone within reach   Time: 1213-1245 OT Time Calculation (min): 32 min Charges:  OT General Charges $OT Visit: 1 Procedure OT Evaluation $Initial OT Evaluation Tier I: 1 Procedure OT Treatments $Self Care/Home Management : 8-22 mins G-Codes:    Pilar Grammes 19-Nov-2014, 1:28 PM

## 2014-11-03 NOTE — Progress Notes (Signed)
Pt c/o 10/10 pain all over body.  Not due for percocet for 15 more min.  MD paged to see if I can give medication early. Sherald Barge

## 2014-11-03 NOTE — Progress Notes (Signed)
Subjective:    Patient continues to have diffuse pain, today most her head and legs. She reports that her breathing is significantly improved along with her cough. She is reluctant to stop IV pain medications.  Interval Events: -Vital signs stable overnight.    Objective:    Vital Signs:   Temp:  [98.3 F (36.8 C)-98.8 F (37.1 C)] 98.8 F (37.1 C) (05/10 1242) Pulse Rate:  [81-97] 84 (05/10 1242) Resp:  [18-20] 18 (05/10 1242) BP: (104-119)/(58-65) 109/58 mmHg (05/10 1242) SpO2:  [93 %-98 %] 95 % (05/10 1242) Weight:  [310 lb 14.4 oz (141.023 kg)] 310 lb 14.4 oz (141.023 kg) (05/10 1248) Last BM Date: 11/01/14  24-hour weight change: Weight change:   Intake/Output:   Intake/Output Summary (Last 24 hours) at 11/03/14 1255 Last data filed at 11/03/14 0927  Gross per 24 hour  Intake    820 ml  Output      0 ml  Net    820 ml      Physical Exam: General: Obese. Well-developed, well-nourished, alert, appropriate and cooperative throughout examination.  Lungs:  Bilateral wheezing, normal respiratory effort.  Heart: RRR. S1 and S2 normal without gallop, murmur, or rubs.  Abdomen:  BS normoactive. Soft, Nondistended, non-tender.  No masses or organomegaly.  Extremities: Tenderness to palpation of extremities bilaterally, no edema.     Labs:  Basic Metabolic Panel:  Recent Labs Lab 10/31/14 0624 11/01/14 0317 11/02/14 0310  NA 135 137 138  K 3.4* 4.7 4.5  CL 99* 99* 99*  CO2 27 31 31   GLUCOSE 94 137* 113*  BUN 7 5* 8  CREATININE 0.53 0.45 0.53  CALCIUM 8.4* 8.6* 8.6*   CBC:  Recent Labs Lab 10/31/14 0624 11/01/14 0317 11/03/14 0705  WBC 17.8* 15.1* 8.8  NEUTROABS 14.3*  --  5.8  HGB 14.0 12.6 12.2  HCT 44.7 42.6 42.1  MCV 84.5 87.1 88.3  PLT 255 264 330    Cardiac Enzymes:  Recent Labs Lab 11/01/14 1020  TROPONINI 0.04*   Microbiology: Results for orders placed or performed during the hospital encounter of 10/31/14  Culture, blood  (routine x 2)     Status: None (Preliminary result)   Collection Time: 10/31/14  5:52 AM  Result Value Ref Range Status   Specimen Description BLOOD RIGHT ARM  Final   Special Requests BOTTLES DRAWN AEROBIC ONLY 10CC  Final   Culture   Final           BLOOD CULTURE RECEIVED NO GROWTH TO DATE CULTURE WILL BE HELD FOR 5 DAYS BEFORE ISSUING A FINAL NEGATIVE REPORT Performed at 12/31/14    Report Status PENDING  Incomplete  Culture, blood (routine x 2)     Status: None (Preliminary result)   Collection Time: 10/31/14  6:04 AM  Result Value Ref Range Status   Specimen Description BLOOD LEFT FOREARM  Final   Special Requests BOTTLES DRAWN AEROBIC ONLY 9CC  Final   Culture   Final           BLOOD CULTURE RECEIVED NO GROWTH TO DATE CULTURE WILL BE HELD FOR 5 DAYS BEFORE ISSUING A FINAL NEGATIVE REPORT Performed at 12/31/14    Report Status PENDING  Incomplete  Urine culture     Status: None   Collection Time: 10/31/14  8:15 AM  Result Value Ref Range Status   Specimen Description URINE, RANDOM  Final   Special Requests NONE  Final   Colony  Count   Final    75,000 COLONIES/ML Performed at [facility]    Culture   Final    Multiple bacterial morphotypes present, none predominant. Suggest appropriate recollection if clinically indicated. Performed at [facility]    Report Status 11/01/2014 FINAL  Final  MRSA PCR Screening     Status: None   Collection Time: 10/31/14 12:23 PM  Result Value Ref Range Status   MRSA by PCR NEGATIVE NEGATIVE Final    Comment:        The GeneXpert MRSA Assay (FDA approved for NASAL specimens only), is one component of a comprehensive MRSA colonization surveillance program. It is not intended to diagnose MRSA infection nor to guide or monitor treatment for MRSA infections.   Culture, expectorated sputum-assessment     Status: None   Collection Time: 11/02/14  7:00 PM  Result Value Ref Range Status   Specimen  Description SPUTUM  Final   Special Requests Immunocompromised  Final   Sputum evaluation   Final    THIS SPECIMEN IS ACCEPTABLE. RESPIRATORY CULTURE REPORT TO FOLLOW.   Report Status 11/03/2014 FINAL  Final  Culture, respiratory (NON-Expectorated)     Status: None (Preliminary result)   Collection Time: 11/02/14  7:00 PM  Result Value Ref Range Status   Specimen Description SPUTUM  Final   Special Requests NONE  Final   Gram Stain   Final    FEW WBC PRESENT,BOTH PMN AND MONONUCLEAR RARE SQUAMOUS EPITHELIAL CELLS PRESENT RARE GRAM NEGATIVE RODS RARE GRAM POSITIVE COCCI IN PAIRS IN CHAINS    Culture PENDING  Incomplete   Report Status PENDING  Incomplete   Imaging: No results found.     Medications:    Infusions:    Scheduled Medications: . azaTHIOprine  250 mg Oral Daily  . gabapentin  100 mg Oral TID  . hydroxychloroquine  200 mg Oral BID  . ipratropium-albuterol  3 mL Nebulization QID  . levofloxacin  750 mg Oral Daily  . mometasone-formoterol  1 puff Inhalation BID  . nystatin  5 mL Oral QID  . predniSONE  60 mg Oral Q breakfast  . rivaroxaban  20 mg Oral Q supper  . sodium chloride  3 mL Intravenous Q12H    PRN Medications: albuterol, hydrOXYzine, ketorolac, oxyCODONE-acetaminophen   Assessment/ Plan:    Active Problems:   INTERSTITIAL LUNG DISEASE   Long term current use of systemic steroids   Obesity, Class III, BMI 40-49.9 (morbid obesity)   Systemic lupus erythematosus   Headache   Acute dyspnea   Moderate obstructive sleep apnea   Total body pain   Rheumatoid arthritis flare   HCAP (healthcare-associated pneumonia)   Polyarthritis   SOB (shortness of breath)  #HCAP Breathing and cough improved, continues to require oxygen and will likely need this at discharge. White blood cell count down to 8.8 today. -Continue oral Levaquin, day 4 of 7. -Mucinex DM twice a day when necessary. -Wean oxygen as tolerated, keep O2 sats greater than  92%. -Treat asthma as below.  #?SLE/RA flare versus fibromyalgia Her pain does not seem to be responding to steroids, suggesting this is not related to inflammation. Per recent notes from rheumatology at [facility], it appears that most of her pain is related to fibromyalgia.  Anti-double-stranded DNA negative. -Stop Dilaudid. -Switch from Toradol to naproxen 500 mg twice a day. -Increase Percocet 5-325, 2 tabs to every 4 hours as needed. -Start gabapentin 100 mg 3 times a day. -Continue prednisone  60 mg daily. -Continue home Imuran 250 mg daily. -Continue home Plaquenil 200 mg twice a day. -We'll have PCP arrange outpatient follow-up due to concern for no-show. -Encouraged ambulation and working with PT. -Patient will have to call [facility] transportation/mobility service. -[employer] will mail patient and ordered her application. -Care management to assist with [facility] letter on day of discharge.  #Asthma/interstitial lung disease Continued wheezing on exam but breathing has improved. -Monitor peak expiratory flow. -Continue DuoNeb's every 4 hours. -Continue albuterol nebulizers every 2 hours as needed. -Continue home Dulera. -Prednisone as above.  #History of PE -Continue home Xarelto 20 g daily.  #Thyroid nodule -Needs outpatient FNA.   DVT PPX - Xarelto.  CODE STATUS - Full.  CONSULTS PLACED - None.  DISPO - likely discharged tomorrow.   The patient does have a current PCP ([facility] Pickup, Ellison Carwin, MD) and does not need an [facility] hospital follow-up appointment after discharge.    Is the [facility] hospital follow-up appointment a one-time only appointment? not applicable.  Does the patient have transportation limitations that hinder transportation to clinic appointments? yes   SERVICE NEEDED AT DISCHARGE - TO BE DETERMINED DURING HOSPITAL COURSE         Y = Yes, Blank = No PT:   OT:   RN:   Equipment:   Other:      Length of Stay: 3 day(s)   Signed: Donavan Foil,  MD  PGY-1, Internal Medicine Resident Pager: [phone] (7AM-5PM) 11/03/2014, 12:55 PM

## 2014-11-03 NOTE — Evaluation (Signed)
Physical Therapy Evaluation Patient Details Name: Donna Hall MRN: [id number] DOB: 17-Aug-1985 Today's Date: 11/03/2014   History of Present Illness  Pt is a 29yo woman with HCAP. PMHx of SLE, rheumatoid arthritis, asthma, PE on xarelto, interstitial lung disease, and depression who presents to the ED with fever and dyspnea. Patient was hospitalized from 4/29-5/2 for an acute RA flare.   Clinical Impression  Patient demonstrates deficits in functional mobility as indicated below. Will benefit from continued skilled PT to address deficits and maximize function. Will see as indicated and progress as tolerated.     Follow Up Recommendations Supervision/Assistance - 24 hour (pending progress)    Equipment Recommendations  Rolling walker with 5" wheels    Recommendations for Other Services       Precautions / Restrictions Precautions Precautions: Fall Restrictions Weight Bearing Restrictions: No      Mobility  Bed Mobility Overal bed mobility: Needs Assistance Bed Mobility: Supine to Sit;Sit to Supine     Supine to sit: Min assist Sit to supine: Supervision   General bed mobility comments: Min assist to pull trunk to upright  Transfers Overall transfer level: Needs assistance Equipment used: 1 person hand held assist Transfers: Sit to/from Stand Sit to Stand: Min assist;From elevated surface         General transfer comment: min assist for stability secondary to pain in bilateral LEs  Ambulation/Gait Ambulation/Gait assistance: Min assist Ambulation Distance (Feet): 25 Feet Assistive device: 1 person hand held assist Gait Pattern/deviations: Step-to pattern;Decreased stride length;Wide base of support;Trunk flexed;Staggering right;Staggering left Gait velocity: decreased Gait velocity interpretation: Below normal speed for age/gender General Gait Details: very unsteady with ambulation, multiple incidence of LOB requiring patient to reach out for counter and  bed. Unable to grip RW at this time secondary to pain in UEs  Stairs            Wheelchair Mobility    Modified Rankin (Stroke Patients Only)       Balance Overall balance assessment: Needs assistance         Standing balance support: No upper extremity supported Standing balance-Leahy Scale: Fair Standing balance comment: could not handle challenge                             Pertinent Vitals/Pain Pain Assessment: Faces Pain Score: 10-Worst pain ever Faces Pain Scale: Hurts whole lot Pain Location: Right lower leg, left upper leg, bilateral elbows Pain Descriptors / Indicators: Grimacing;Discomfort;Constant Pain Intervention(s): Limited activity within patient's tolerance;Monitored during session;Premedicated before session;Repositioned    Home Living Family/patient expects to be discharged to:: Private residence Living Arrangements: Spouse/significant other;Children Available Help at Discharge: Family;Other (Comment) (spouse works, sister-in-law helps out) Type of Home: Apartment Home Access: Stairs to enter Entrance Stairs-Rails: None Entrance Stairs-Number of Steps: 2 Home Layout: One level Home Equipment: None      Prior Function Level of Independence: Independent;Needs assistance   Gait / Transfers Assistance Needed: pt reports independent with ambulation  ADL's / Homemaking Assistance Needed: pt reports independent most of the time but when her pain is high her spouse helps some with bathing/dressing        Hand Dominance        Extremity/Trunk Assessment   Upper Extremity Assessment:  (difficult to assess due to pain, limited UE use during sessi)           Lower Extremity Assessment: Generalized weakness;RLE deficits/detail;LLE deficits/detail  Cervical / Trunk Assessment:  (increased body habitus)  Communication   Communication: Prefers language other than [place];Interpreter utilized;Other (comment) (Spanish,  understands and speaks some basic [place])  Cognition Arousal/Alertness: Awake/alert Behavior During Therapy: WFL for tasks assessed/performed Overall Cognitive Status: Difficult to assess                      General Comments General comments (skin integrity, edema, etc.): Vitals assessed, patient with increased pain and increased WOB / DOE throughout session, ambulated on 4 liters with saturations 94%, returned to 2 liters at rest.    Exercises        Assessment/Plan    PT Assessment Patient needs continued PT services  PT Diagnosis Difficulty walking;Abnormality of gait;Acute pain   PT Problem List Decreased strength;Decreased activity tolerance;Decreased balance;Decreased mobility;Decreased coordination;Decreased range of motion;Pain;Obesity  PT Treatment Interventions DME instruction;Gait training;Stair training;Functional mobility training;Therapeutic activities;Therapeutic exercise;Balance training;Patient/family education   PT Goals (Current goals can be found in the Care Plan section) Acute Rehab PT Goals Patient Stated Goal: not stated PT Goal Formulation: With patient Time For Goal Achievement: 11/17/14 Potential to Achieve Goals: Good    Frequency Min 3X/week   Barriers to discharge        Co-evaluation               End of Session Equipment Utilized During Treatment: Oxygen Activity Tolerance: Patient limited by pain;Patient limited by fatigue Patient left: in bed;with call bell/phone within reach;with bed alarm set Nurse Communication: Mobility status         Time: 2778-2423 PT Time Calculation (min) (ACUTE ONLY): 18 min   Charges:   PT Evaluation $Initial PT Evaluation Tier I: 1 Procedure     PT G CodesFabio Asa 2014/11/15, 4:39 PM Charlotte Crumb, PT DPT  [phone]

## 2014-11-03 NOTE — Discharge Summary (Addendum)
Name: Donna Hall MRN: [id number] DOB: 05/03/1986 29 y.o. PCP: Dessa Phi, MD  Date of Admission: 10/31/2014  5:22 AM Date of Discharge: 11/04/2014 Attending Physician: Burns Spain, MD  Discharge Diagnosis: Principal Problem:   Chronic pain syndrome Active Problems:   INTERSTITIAL LUNG DISEASE   Long term current use of systemic steroids   Obesity, Class III, BMI 40-49.9 (morbid obesity)   Systemic lupus erythematosus   Headache   Acute dyspnea   Moderate obstructive sleep apnea   Rheumatoid arthritis flare   HCAP (healthcare-associated pneumonia)   Polyarthritis   SOB (shortness of breath)  Discharge Medications:   Medication List    TAKE these medications        albuterol 108 (90 BASE) MCG/ACT inhaler  Commonly known as:  PROVENTIL HFA;VENTOLIN HFA  Inhale 2 puffs into the lungs every 4 (four) hours as needed for wheezing or shortness of breath.     amitriptyline 10 MG tablet  Commonly known as:  ELAVIL  Take 1 tablet (10 mg total) by mouth at bedtime.     azaTHIOprine 50 MG tablet  Commonly known as:  IMURAN  Take 5 tablets (250 mg total) by mouth daily.     dextromethorphan-guaiFENesin 30-600 MG per 12 hr tablet  Commonly known as:  MUCINEX DM  Take 1 tablet by mouth 2 (two) times daily as needed for cough.     diphenhydrAMINE 2 % cream  Commonly known as:  BENADRYL  Apply topically 3 (three) times daily as needed for itching.     hydroxychloroquine 200 MG tablet  Commonly known as:  PLAQUENIL  Take 1 tablet (200 mg total) by mouth 2 (two) times daily.     hydrOXYzine 10 MG tablet  Commonly known as:  ATARAX/VISTARIL  Take 1 tablet (10 mg total) by mouth every 8 (eight) hours as needed for itching.     levofloxacin 750 MG tablet  Commonly known as:  LEVAQUIN  Take 1 tablet (750 mg total) by mouth daily.     mometasone-formoterol 100-5 MCG/ACT Aero  Commonly known as:  DULERA  Take 2 puffs first thing in am and then another 2  puffs about 12 hours later.     naproxen 500 MG tablet  Commonly known as:  NAPROSYN  Take 1 tablet (500 mg total) by mouth 2 (two) times daily with a meal.     nystatin 100000 UNIT/ML suspension  Commonly known as:  MYCOSTATIN  Take 5 mLs (500,000 Units total) by mouth 4 (four) times daily.     oxyCODONE-acetaminophen 10-325 MG per tablet  Commonly known as:  PERCOCET  Take 1 tablet by mouth every 4 (four) hours as needed for pain.     predniSONE 10 MG tablet  Commonly known as:  DELTASONE  Take 1-4 tablets (10-40 mg total) by mouth daily with breakfast. 40 mg daily for one week, then 20 mg daily for one week, then 10 mg daily.     rivaroxaban 20 MG Tabs tablet  Commonly known as:  XARELTO  Take 1 tablet (20 mg total) by mouth daily with supper.        Disposition and follow-up:   Donna Hall was discharged from [facility] in Stable condition.  At the hospital follow up visit please address:  1.  Arrange follow up with rheumatology, ensure medication compliance and completion of antibiotics, consider increasing amitriptyline and adjusting pain regimen, FNA of thyroid nodule, treatment of anal condyloma, patient has  been set up with [facility].  2.  Labs / imaging needed at time of follow-up: None.  3.  Pending labs/ test needing follow-up: Blood cultures, sputum cultures, no growth to date.  Follow-up Appointments: Follow-up Information    Follow up with Lora Paula, MD On 11/06/2014.   Specialty:  Family Medicine   Why:  12 pm   Contact information:   [address] [place] [address] [phone]       Follow up with [facility].   Why:  home health nurse and PT    Contact information:   [address] [place] [address] [phone]       Discharge Instructions: Discharge Instructions    Call MD for:  difficulty breathing, headache or visual disturbances    Complete by:  As directed      Call  MD for:  extreme fatigue    Complete by:  As directed      Call MD for:  hives    Complete by:  As directed      Call MD for:  persistant nausea and vomiting    Complete by:  As directed      Call MD for:  redness, tenderness, or signs of infection (pain, swelling, redness, odor or green/yellow discharge around incision site)    Complete by:  As directed      Call MD for:  temperature >100.4    Complete by:  As directed      Diet - low sodium heart healthy    Complete by:  As directed      Increase activity slowly    Complete by:  As directed           -Es Absolutamente crtico que usted asiste a su cita con el mdico el viernes . Yo slo le he proporcionado suficiente medicamento para el dolor para llegar a esta cita. -Es muy importante que usted recoja su inhalador Dulera para prevenir futuros ataques de asma. -Asegrese De que complete todas sus antibiotcs. Va a acabar con ellos en 5.13.16. -Su dolor es probablemente provocada por una condicin llamada fibromialgia . He incluido algunas informaciones sobre esta condicin a continuacin. Ser fundamental que aumente su actividad y perder peso para reducir Chief Technology Officer. -Asegrese de revisar la lista de medicamentos adjunto. Recoge todas las recetas y tomar los medicamentos segn las indicaciones. -[facility] (1 [phone]) pondr en contacto con usted con una cita.  Ellos pueden ayudarle a tratar mejor a sus problemas de salud y cuidado de la salud de Teacher, adult education. -Para la llamada asistencia de transporte: [employer] [phone]).  Consultations: None.  Procedures Performed:  Dg Chest 2 View  10/31/2014   CLINICAL DATA:  Cough, fever, shortness of breath and wheezing for several months.  EXAM: CHEST  2 VIEW  COMPARISON:  Earlier today.  FINDINGS: Poor inspiration. Normal sized heart. Diffuse peribronchial thickening and prominence of the interstitial markings without significant  change. Mild left lower lobe airspace opacity. Unremarkable bones.  IMPRESSION: 1. Mild left lower lobe pneumonia or patchy atelectasis. 2. Stable bronchitic changes and chronic interstitial lung disease.   Electronically Signed   By: Beckie Salts M.D.   On: 10/31/2014 09:53   Dg Chest 2 View  10/23/2014   CLINICAL DATA:  Generalized body aches for 3 days. Shortness of breath  EXAM: CHEST  2 VIEW  COMPARISON:  07/28/2014 CT. Plain films 09/11/2014  and multiple 2015 films, dating back to 08/14/2013.  FINDINGS: Low lung volumes. Patchy lung opacities in the left lung, stable to prior study and studies dating back to early 2015 compatible with chronic lung disease. No acute airspace opacities or effusions. No acute bony abnormality. Heart is normal size. Mediastinal contours are within normal limits.  IMPRESSION: Chronic lung changes. Hypoventilation. No acute cardiopulmonary disease.   Electronically Signed   By: Charlett Nose M.D.   On: 10/23/2014 09:38   Dg Chest Port 1 View  10/31/2014   CLINICAL DATA:  29 year old female with a history of productive cough  EXAM: PORTABLE CHEST - 1 VIEW  COMPARISON:  Multiple prior, most recent plain film 10/23/2014. Prior CT 07/29/2014  FINDINGS: Low lung volumes accentuates the interstitium.  Increasing interstitial opacities of the left lung.  Apical lordotic positioning.  Cardiomediastinal silhouette unchanged.  No large pleural effusion or pneumothorax.  No displaced fracture.  IMPRESSION: Low lung volumes, with increasing opacities on the left concerning for developing infection given the patient history.  Signed,  Yvone Neu. Loreta Ave, DO  [facility]  [facility]   Electronically Signed   By: Gilmer Mor D.O.   On: 10/31/2014 07:23   Admission HPI:  Donna Hall is a 28yo woman with PMHx of SLE, rheumatoid arthritis, asthma, PE on xarelto, interstitial lung disease, and depression who presents to the ED with fever and  dyspnea. Patient is Spanish-speaking and history was obtained through a Radio producer. Patient was hospitalized from 4/29-5/2 for an acute RA flare. She improved after restarting her home Plaquenil 200 mg BID and Imuran 250 mg daily, and her Prednisone was increased to 60 mg daily. She was discharged on a Prednisone taper starting at 40 mg and decreasing over the next 5 days to her baseline of 10 mg daily. An appointment with her rheumatologist was scheduled on 5/4 but the patient missed this appointment due to transportation issues. She also missed her appointment with her PCP yesterday due to being unaware of the appointment.   Patient states she came to the ED because of fevers at home and increased dyspnea. She reports not feeling well in general. She describes dyspnea at rest and on exertion. She reports a productive cough of yellow sputum. She notes headaches and joint pains in her hands, wrists, and knees. She states she did complete the Prednisone taper and has been taking 10 mg daily. She denies any chest pain, abdominal pain, nausea, vomiting, and dysuria.   In the ED, she was noted to be tachycardic in the 110s-120s, fever of 102.8, and RR 25-27. A portable CXR was concerning for possible left lower lobe infiltrates. She was started on Vanc and Zosyn empirically.  Hospital Course by problem list: Principal Problem:   Chronic pain syndrome Active Problems:   INTERSTITIAL LUNG DISEASE   Long term current use of systemic steroids   Obesity, Class III, BMI 40-49.9 (morbid obesity)   Systemic lupus erythematosus   Headache   Acute dyspnea   Moderate obstructive sleep apnea   Rheumatoid arthritis flare   HCAP (healthcare-associated pneumonia)   Polyarthritis   SOB (shortness of breath)   #Healthcare associated pneumonia The patient had recently been hospitalized with an acute RA flare, and she returned with a fever and productive cough. She was noted to be hypoxic, and chest x-ray was  concerning for a left lower lobe pneumonia. As result, she was started on vancomycin and Zosyn. Blood cultures, sputum cultures, strep  and Legionella antigens, and influenza panel were negative. Her shortness of breath and cough improved over the next couple of days, and she was transitioned to oral Levaquin for a total of 7 days. She'll complete her course of antibiotics on 11/06/2014.   #Asthma/interstitial lung disease The patient has a history of asthma and interstitial lung disease, and she follows with Dr. Sherene Sires of pulmonology. She has previously been on home oxygen in the past, although she says she currently does not use it. She is maintained on prednisone 10 mg daily at baseline. She was also noted to have diffuse wheezing on exam, and an asthma flare was thought to be contributing to some of her shortness of breath. Her home dose of prednisone was increased to 60 mg daily, and she was given duo nebs and albuterol nebulizers with improvement of her wheezing. She remained hypoxic on room air, so home oxygen was arranged at discharge.  #Fibromyalgia versus RA/SLE The patient has a history of multiple autoimmune disorders. 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 She also had an elevated CCP antibody and rheumatoid factor in 2013. However, she has had multiple negative dsDNA antibodies, and hand x-rays have been negative for classical findings of rheumatoid arthritis the past. She follows with [facility] rheumatology who noted earlier this year that most of her pain is most likely due to fibromyalgia, and she was started on Cymbalta in the past although she reports that this did not help with her pain. The patient was recently hospitalized with a presumed RA flare, and she was discharged home on a prednisone taper. She missed her rheumatology appointment due to transportation issues. On presentation, she again complained of diffuse  nonspecific pain that was migratory in nature, occasionally affecting her head and upper and lower extremities. She was again started on her home Imuran and hydroxychloroquine, and her prednisone dose was increased to 60 mg daily. Anti-dsDNA antibody was again negative, and her pain persisted despite steroids along with oral and IV opioids. As result, she was started on gabapentin 100 mg 3 times a day to treat presumed fibromyalgia and transitioned to naproxen 500 mg twice a day. She was unable to tolerate the gabapentin due to nausea, so she was switched to amitriptyline 10 mg daily at bedtime.  Physical therapy was consulted, and the patient was encouraged to ambulate.  She was educated on the importance of losing weight and exercising to reduce her pain and improve her energy. A short course of Percocet was provided at discharge to get the patient to her follow-up appointment with her PCP, and she was given a prolonged prednisone taper (40 mg for 1 week, then 20 mg 1 week, then resume home 10 mg daily). Care management was consulted during the hospitalization, and she was set up with [employer] and given resources for transportation to medical appointments.  At follow-up, please help to arrange follow-up with rheumatology and address her pain regimen.  #History of pulmonary embolism The patient was maintained on her home Xarelto 20 mg daily during the hospitalization.  #Thyroid nodule The patient has a 2 cm thyroid nodule in her left thyroid gland that will require FNA as an outpatient.  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  She may benefit from treatment as an outpatient.  Discharge Vitals:   BP 111/65 mmHg  Pulse 74  Temp(Src) 98.3 F (36.8 C) (Oral)  Resp  17  Ht 5\' 1"  (1.549 m)  Wt 310 lb 14.4 oz (141.023 kg)  BMI 58.77 kg/m2  SpO2 97%  LMP 10/06/2014  Discharge Labs:  No results found for this or any previous visit (from the past 24  hour(s)).  Signed: 12/06/2014, MD 11/04/2014, 1:59 PM    Services Ordered on Discharge: Home health RN, PT. Equipment Ordered on Discharge: 3 in 1, shower stool, rolling walker, oxygen.

## 2014-11-04 DIAGNOSIS — G894 Chronic pain syndrome: Secondary | ICD-10-CM

## 2014-11-04 MED ORDER — MOMETASONE FURO-FORMOTEROL FUM 100-5 MCG/ACT IN AERO: INHALATION_SPRAY | RESPIRATORY_TRACT | Status: DC

## 2014-11-04 MED ORDER — AMITRIPTYLINE HCL 10 MG PO TABS: 10.0000 mg | ORAL_TABLET | Freq: Every day | ORAL | Status: DC

## 2014-11-04 MED ORDER — OXYCODONE-ACETAMINOPHEN 10-325 MG PO TABS: 1.0000 | ORAL_TABLET | ORAL | Status: DC | PRN

## 2014-11-04 MED ORDER — NAPROXEN 500 MG PO TABS: 500.0000 mg | ORAL_TABLET | Freq: Two times a day (BID) | ORAL | Status: DC

## 2014-11-04 MED ORDER — DM-GUAIFENESIN ER 30-600 MG PO TB12: 1.0000 | ORAL_TABLET | Freq: Two times a day (BID) | ORAL | Status: DC | PRN

## 2014-11-04 MED ORDER — PREDNISONE 10 MG PO TABS: 10.0000 mg | ORAL_TABLET | Freq: Every day | ORAL | Status: DC

## 2014-11-04 MED ORDER — LEVOFLOXACIN 750 MG PO TABS: 750.0000 mg | ORAL_TABLET | Freq: Every day | ORAL | Status: AC

## 2014-11-04 NOTE — Care Management Note (Addendum)
Case Management Note  Patient Details  Name: Donna Hall MRN: [id number] Date of Birth: 02-19-1986  Subjective/Objective:                    Action/Plan:  Use Interpreter phone line .  Explained MATCH letter to patient , prescriptions were sent to [facility] by MD . Patient aware .  Explained home oxygen , tank will be delivered to hospital room today before discharge .   Gave patient telephone number for Transportation / Mobility Service and [facility] will be calling her to arrange appointment to apply for orange card .   Confirmed face sheet information with patient.     Expected Discharge Date:  11/03/14               Expected Discharge Plan:  Home w Home Health Services  In-House Referral:  Clinical Social Work, Museum/gallery exhibitions officer  CM Consult  Post Acute Care Choice:  Durable Medical Equipment Choice offered to:  Patient  DME Arranged:  3-N-1, Shower stool, Walker rolling, Oxygen DME Agency:  [facility]  HH Arranged:  RN, PT [facility] Agency:  [facility]  Status of Service:  Completed, signed off  Medicare Important Message Given:    Date Medicare IM Given:    Medicare IM give by:    Date Additional Medicare IM Given:    Additional Medicare Important Message give by:     If discussed at Long Length of Stay Meetings, dates discussed:    Additional Comments:  Kingsley Plan, RN 11/04/2014, 2:13 PM

## 2014-11-04 NOTE — Progress Notes (Signed)
SATURATION QUALIFICATIONS: (This note is used to comply with regulatory documentation for home oxygen)  Patient Saturations on Room Air at Rest = 94%  Patient Saturations on Room Air while Ambulating = 85%  Patient Saturations on 2 Liters of oxygen while Ambulating = 93%  Please briefly explain why patient needs home oxygen:  Pt sats drop to mid 80's on exertion without oxygen supplementation.  With 2L oxygen applied, pt's sats bounced back up to 93%. Donna Hall

## 2014-11-04 NOTE — Progress Notes (Signed)
Subjective:    The patient reports continued resolution of her shortness of breath and cough. She feels slightly better than when she came in with decreased wheezing. She says that her pain is stable from yesterday, but is still giving her difficulty. She says that she did not tolerate the gabapentin because it made her nauseous.  Interval Events: -Vital signs stable overnight.    Objective:    Vital Signs:   Temp:  [98.3 F (36.8 C)-98.8 F (37.1 C)] 98.3 F (36.8 C) (05/11 0501) Pulse Rate:  [68-84] 74 (05/11 0501) Resp:  [17-18] 17 (05/11 0501) BP: (109-111)/(58-65) 111/65 mmHg (05/11 0501) SpO2:  [95 %-99 %] 99 % (05/11 0501) Weight:  [310 lb 14.4 oz (141.023 kg)] 310 lb 14.4 oz (141.023 kg) (05/10 1248) Last BM Date: 11/01/14  24-hour weight change: Weight change:   Intake/Output:   Intake/Output Summary (Last 24 hours) at 11/04/14 0836 Last data filed at 11/04/14 0600  Gross per 24 hour  Intake    480 ml  Output      0 ml  Net    480 ml      Physical Exam: General: Obese. Well-developed, well-nourished, alert, appropriate and cooperative throughout examination.  Lungs:  Clear to auscultation bilaterally, no wheezing, normal respiratory effort.  Heart: RRR. S1 and S2 normal without gallop, murmur, or rubs.  Abdomen:  BS normoactive. Soft, Nondistended, non-tender.  No masses or organomegaly.  Extremities: Tenderness to palpation of extremities bilaterally, no edema.     Labs:  Basic Metabolic Panel:  Recent Labs Lab 10/31/14 0624 11/01/14 0317 11/02/14 0310  NA 135 137 138  K 3.4* 4.7 4.5  CL 99* 99* 99*  CO2 27 31 31   GLUCOSE 94 137* 113*  BUN 7 5* 8  CREATININE 0.53 0.45 0.53  CALCIUM 8.4* 8.6* 8.6*   CBC:  Recent Labs Lab 10/31/14 0624 11/01/14 0317 11/03/14 0705  WBC 17.8* 15.1* 8.8  NEUTROABS 14.3*  --  5.8  HGB 14.0 12.6 12.2  HCT 44.7 42.6 42.1  MCV 84.5 87.1 88.3  PLT 255 264 330    Cardiac Enzymes:  Recent Labs Lab  11/01/14 1020  TROPONINI 0.04*   Microbiology: Results for orders placed or performed during the hospital encounter of 10/31/14  Culture, blood (routine x 2)     Status: None (Preliminary result)   Collection Time: 10/31/14  5:52 AM  Result Value Ref Range Status   Specimen Description BLOOD RIGHT ARM  Final   Special Requests BOTTLES DRAWN AEROBIC ONLY 10CC  Final   Culture   Final           BLOOD CULTURE RECEIVED NO GROWTH TO DATE CULTURE WILL BE HELD FOR 5 DAYS BEFORE ISSUING A FINAL NEGATIVE REPORT Performed at 12/31/14    Report Status PENDING  Incomplete  Culture, blood (routine x 2)     Status: None (Preliminary result)   Collection Time: 10/31/14  6:04 AM  Result Value Ref Range Status   Specimen Description BLOOD LEFT FOREARM  Final   Special Requests BOTTLES DRAWN AEROBIC ONLY 9CC  Final   Culture   Final           BLOOD CULTURE RECEIVED NO GROWTH TO DATE CULTURE WILL BE HELD FOR 5 DAYS BEFORE ISSUING A FINAL NEGATIVE REPORT Performed at 12/31/14    Report Status PENDING  Incomplete  Urine culture     Status: None   Collection Time: 10/31/14  8:15  AM  Result Value Ref Range Status   Specimen Description URINE, RANDOM  Final   Special Requests NONE  Final   Colony Count   Final    75,000 COLONIES/ML Performed at [facility]    Culture   Final    Multiple bacterial morphotypes present, none predominant. Suggest appropriate recollection if clinically indicated. Performed at [facility]    Report Status 11/01/2014 FINAL  Final  MRSA PCR Screening     Status: None   Collection Time: 10/31/14 12:23 PM  Result Value Ref Range Status   MRSA by PCR NEGATIVE NEGATIVE Final    Comment:        The GeneXpert MRSA Assay (FDA approved for NASAL specimens only), is one component of a comprehensive MRSA colonization surveillance program. It is not intended to diagnose MRSA infection nor to guide or monitor treatment for MRSA  infections.   Culture, expectorated sputum-assessment     Status: None   Collection Time: 11/02/14  7:00 PM  Result Value Ref Range Status   Specimen Description SPUTUM  Final   Special Requests Immunocompromised  Final   Sputum evaluation   Final    THIS SPECIMEN IS ACCEPTABLE. RESPIRATORY CULTURE REPORT TO FOLLOW.   Report Status 11/03/2014 FINAL  Final  Culture, respiratory (NON-Expectorated)     Status: None (Preliminary result)   Collection Time: 11/02/14  7:00 PM  Result Value Ref Range Status   Specimen Description SPUTUM  Final   Special Requests NONE  Final   Gram Stain   Final    FEW WBC PRESENT,BOTH PMN AND MONONUCLEAR RARE SQUAMOUS EPITHELIAL CELLS PRESENT RARE GRAM NEGATIVE RODS RARE GRAM POSITIVE COCCI IN PAIRS IN CHAINS    Culture   Final    NORMAL OROPHARYNGEAL FLORA Performed at [facility]    Report Status PENDING  Incomplete   Imaging: No results found.     Medications:    Infusions:    Scheduled Medications: . amitriptyline  10 mg Oral QHS  . azaTHIOprine  250 mg Oral Daily  . hydroxychloroquine  200 mg Oral BID  . ipratropium-albuterol  3 mL Nebulization QID  . levofloxacin  750 mg Oral Daily  . mometasone-formoterol  1 puff Inhalation BID  . naproxen  500 mg Oral BID WC  . nystatin  5 mL Oral QID  . predniSONE  60 mg Oral Q breakfast  . rivaroxaban  20 mg Oral Q supper  . sodium chloride  3 mL Intravenous Q12H    PRN Medications: albuterol, dextromethorphan-guaiFENesin, hydrOXYzine, oxyCODONE-acetaminophen   Assessment/ Plan:    Principal Problem:   Chronic pain syndrome Active Problems:   INTERSTITIAL LUNG DISEASE   Long term current use of systemic steroids   Obesity, Class III, BMI 40-49.9 (morbid obesity)   Systemic lupus erythematosus   Headache   Acute dyspnea   Moderate obstructive sleep apnea   Rheumatoid arthritis flare   HCAP (healthcare-associated pneumonia)   Polyarthritis   SOB (shortness of  breath)  #HCAP Breathing significantly improved today, she remains on 3 L of oxygen, but her saturations have improved. -Continue oral Levaquin, day 5 of 7. -Mucinex DM twice a day when necessary. -Wean oxygen as tolerated, keep O2 sats greater than 92%. -Determine if patient will need home oxygen. -Treat asthma as below.  #Fibromyalgia versus SLE/RA flare Pain stable from yesterday now on oral pain medications. Did not tolerate gabapentin, and she has not responded well to Cymbalta in the past. As  result, I will try amitriptyline at bedtime. I will give a short course of pain medications at discharge, but she'll need to follow up with her PCP for further management. -Continue naproxen 500 mg twice a day. -Continue Percocet 5-325, 2 tabs to every 4 hours as needed. -Stop gabapentin. -Start amitriptyline 10 mg daily at bedtime. -Continue prednisone 60 mg daily, will prescribed prolonged taper at discharge. -Continue home Imuran 250 mg daily. -Continue home Plaquenil 200 mg twice a day. -Encouraged increased exercise and weight loss. -Ordered a rolling walker, shower stool, and bedside commode per PT and OT recommendations. -Patient will have to call [facility] transportation/mobility service for transportation assistance. -[facility] will contact patient with assistance. -MATCH letter is in chart.  #Asthma/interstitial lung disease Wheezing has resolved. Peak expiratory flow increased to 140 last night, unclear baseline. -Continue to monitor peak expiratory flow. -Continue DuoNeb's every 4 hours. -Continue albuterol nebulizers every 2 hours as needed. -Continue home Dulera. -Prednisone as above.  #History of PE -Continue home Xarelto 20 g daily.  #Thyroid nodule -Needs outpatient FNA.   DVT PPX - Xarelto.  CODE STATUS - Full.  CONSULTS PLACED - None.  DISPO - likely discharged tomorrow.   The patient does have a current PCP ([facility] Pick[facility], Ellison Carwin, MD) and does not need an  [facility] hospital follow-up appointment after discharge.    Is the [facility] hospital follow-up appointment a one-time only appointment? not applicable.  Does the patient have transportation limitations that hinder transportation to clinic appointments? yes   SERVICE NEEDED AT DISCHARGE - TO BE DETERMINED DURING HOSPITAL COURSE         Y = Yes, Blank = No PT:   OT:   RN:   Equipment: 3 in 1, rolling walker, shower seat  Other:      Length of Stay: 4 day(s)   Signed: Donavan Foil, MD  PGY-1, Internal Medicine Resident Pager: [phone] (7AM-5PM) 11/04/2014, 8:36 AM

## 2014-11-05 LAB — CULTURE, RESPIRATORY: Culture: NORMAL

## 2014-11-06 ENCOUNTER — Ambulatory Visit: Payer: Self-pay | Attending: Family Medicine | Admitting: Family Medicine

## 2014-11-06 ENCOUNTER — Encounter: Payer: Self-pay | Admitting: Family Medicine

## 2014-11-06 VITALS — BP 101/68 | HR 105 | Temp 99.0°F | Resp 18 | Ht 62.5 in | Wt 299.0 lb

## 2014-11-06 DIAGNOSIS — M069 Rheumatoid arthritis, unspecified: Secondary | ICD-10-CM | POA: Insufficient documentation

## 2014-11-06 DIAGNOSIS — J189 Pneumonia, unspecified organism: Secondary | ICD-10-CM | POA: Insufficient documentation

## 2014-11-06 DIAGNOSIS — M329 Systemic lupus erythematosus, unspecified: Secondary | ICD-10-CM | POA: Insufficient documentation

## 2014-11-06 LAB — CULTURE, BLOOD (ROUTINE X 2)
CULTURE: NO GROWTH
Culture: NO GROWTH

## 2014-11-06 NOTE — Patient Instructions (Addendum)
  1. Lupus, RA: You have rheumatology follow up with  Dr. Vladimir Faster 2 For 12th July 2016 at 4 pm. You must keep f/u and make it to your appointments  2. HCAP: Finish levaquin- 750 mg tomorrow is last dose  Prednisone taper per calendar (written calendar provided for patient) (40 mg yesterday, today, until 11/08/14, then 30 mg until 11/12/14, then 20 mg until 11/16/14, then back to baseline dose of 10 mg daily starting 11/17/14)  F/u with me in 4 weeks  F/u with Rheumatology in 2 months    Dr. Armen Pickup

## 2014-11-06 NOTE — Progress Notes (Signed)
   Subjective:    Patient ID: Donna Hall, female    DOB: 10-08-1985, 29 y.o.   MRN: [id number] CC; HFU for  CP, SOB, HCAP  Spanish interpreter present  HPI 29 yo F with multiple rheumatological disease, illiterate, spanish speaker, lack of transportation   1. HFU: for CP, SOB, HCAP. Patient with mild LL PNA vs atelectasis. She has not yet finished levaquin. She is taking prednisone but not as advised following D/C from hospital.   2. Rheumatological diseases: lupus, RA, interstitial lung disease, psoriasis. Taking prednisone, plaquenil, imuran. She has all of her medications with her today. However, is it difficult to tell per packages if she is taking the medications appropriately. She has missed multiple appt for rheumatology f/u due to lack of transportation or hospitalization.   Soc Hx: non smoker   Review of Systems  Constitutional: Negative for fever and chills.  Respiratory: Positive for cough and shortness of breath. Negative for chest tightness.   Cardiovascular: Negative for chest pain, palpitations and leg swelling.       Objective:   Physical Exam BP 101/68 mmHg  Pulse 105  Temp(Src) 99 F (37.2 C) (Oral)  Resp 18  Ht 5' 2.5" (1.588 m)  Wt 299 lb (135.626 kg)  BMI 53.78 kg/m2  SpO2 96%  LMP 10/06/2014 General appearance: alert, cooperative, no distress and morbidly obese Lungs: clear to auscultation bilaterally Heart: regular rate and rhythm, S1, S2 normal, no murmur, click, rub or gallop Extremities: extremities normal, atraumatic, no cyanosis or edema   Patient escorted to pharmacy to pick up the following: prednisone, Levaquin, imuran, hydroxyzine  Patient took Levaquin 750 mg in office x 1 also prednisone 10 mg x 1 (for total daily dose of 40 mg)     Assessment & Plan:

## 2014-11-06 NOTE — Assessment & Plan Note (Addendum)
Lupus, RA: You have rheumatology follow up with  Dr. Vladimir Faster 2 For 12th July 2016 at 4 pm. You must keep f/u and make it to your appointments Case management and CSW consulted to arrange transportation and address barriers.

## 2014-11-06 NOTE — Progress Notes (Signed)
HFU Pneumonia Stated SOB

## 2014-11-06 NOTE — Progress Notes (Signed)
ASSESSMENT: Pt experiencing stressors related to lack of transportation and language barriers; she needs to F/U with upcoming health appointments. Pt would benefit from access and referral to appropriate community resources. Stage of Change: contemplative  PLAN: 1. F/U with behavioral health consultant in as needed  2. Psychiatric Medications: none. 3. Behavioral recommendation(s):   -Continue to advocate for herself in speaking up about her needs  SUBJECTIVE: Pt. referred by Dr Armen Pickup for determining psychosocial needs:  Pt. here for referral regarding psychosocial needs.  Pt. reports the following symptoms/concerns: Pt states that she has received oxygen, delivered to her home by [facility]. She says that she has been out of food stamps for about two months; she reapplied a month ago, but was told by DSS food stamp worker to wait a full 40 days before inquiring about her case. Pt says she has missed appointments because she does not have transportation, nor is the bus stop close enough for her to take the [place] bus.  Duration of problem: unknown Severity: moderate  OBJECTIVE: Orientation & Cognition: Oriented x3. Thought processes normal and appropriate to situation. Mood: appropriate, some tears Affect: appropriate Appearance: appropriate Risk of harm to self or others: no risk of harm to self or others Substance use: none Psychiatric medication use: Unchanged from prior contact. Assessments administered: none  Diagnosis: Problem related to unspecified psychosocial circumstances CPT Code: Z65.9 -------------------------------------------- Other(s) present in the room: 5yo son  Time spent with patient in exam room: 25 minutes

## 2014-11-06 NOTE — Assessment & Plan Note (Signed)
HCAP: Finish levaquin- 750 mg tomorrow is last dose  Prednisone taper per calendar (written calendar provided for patient) (40 mg yesterday, today, until 11/08/14, then 30 mg until 11/12/14, then 20 mg until 11/16/14, then back to baseline dose of 10 mg daily starting 11/17/14)

## 2014-11-06 NOTE — Assessment & Plan Note (Signed)
Lupus, RA: You have rheumatology follow up with  Dr. O'Rourke/Fellow 2 For 12th July 2016 at 4 pm. You must keep f/u and make it to your appointments Case management and CSW consulted to arrange transportation and address barriers.   

## 2014-11-18 ENCOUNTER — Telehealth: Payer: Self-pay | Admitting: *Deleted

## 2014-11-18 NOTE — Telephone Encounter (Signed)
-----   Message from Josalyn Funches, MD sent at 11/05/2014  8:23 AM EDT ----- Negative respiratory culture 

## 2014-11-18 NOTE — Telephone Encounter (Signed)
Spoke with patient via [employer], [place] [address] Patient offered walk-in appt tomorrow 11/19/14 for increased wheezing and cough as relayed to [place] by Ezekiel Slocumb, Nurse with [facility] [phone])

## 2014-11-18 NOTE — Telephone Encounter (Signed)
Donna Hall, nurse from [facility] left message on nurse line (11/17/14 at 1413) stating this patient has increased wheezing and cough. States vital signs are stable and patient is afebrile. Patient has appt for nurse visit for CPAP on 12/07/14

## 2014-11-18 NOTE — Telephone Encounter (Signed)
Unable to contact Pt No voice mail set up

## 2014-11-18 NOTE — Telephone Encounter (Signed)
-----   Message from Dessa Phi, MD sent at 11/05/2014  8:23 AM EDT ----- Negative respiratory culture

## 2014-11-19 ENCOUNTER — Emergency Department (HOSPITAL_COMMUNITY): Payer: Self-pay

## 2014-11-19 ENCOUNTER — Encounter (HOSPITAL_COMMUNITY): Payer: Self-pay | Admitting: General Practice

## 2014-11-19 ENCOUNTER — Emergency Department (HOSPITAL_COMMUNITY)
Admission: EM | Admit: 2014-11-19 | Discharge: 2014-11-19 | Disposition: A | Discharge status: Home / Self Care | Payer: Self-pay | Attending: Emergency Medicine | Admitting: Emergency Medicine

## 2014-11-19 ENCOUNTER — Ambulatory Visit: Payer: Self-pay | Attending: Family Medicine | Admitting: Family Medicine

## 2014-11-19 VITALS — BP 112/78 | HR 112 | Wt 303.4 lb

## 2014-11-19 DIAGNOSIS — Z8632 Personal history of gestational diabetes: Secondary | ICD-10-CM | POA: Insufficient documentation

## 2014-11-19 DIAGNOSIS — R0602 Shortness of breath: Secondary | ICD-10-CM

## 2014-11-19 DIAGNOSIS — I10 Essential (primary) hypertension: Secondary | ICD-10-CM | POA: Insufficient documentation

## 2014-11-19 DIAGNOSIS — Z7901 Long term (current) use of anticoagulants: Secondary | ICD-10-CM | POA: Insufficient documentation

## 2014-11-19 DIAGNOSIS — Z872 Personal history of diseases of the skin and subcutaneous tissue: Secondary | ICD-10-CM | POA: Insufficient documentation

## 2014-11-19 DIAGNOSIS — Z7952 Long term (current) use of systemic steroids: Secondary | ICD-10-CM | POA: Insufficient documentation

## 2014-11-19 DIAGNOSIS — Z8701 Personal history of pneumonia (recurrent): Secondary | ICD-10-CM | POA: Insufficient documentation

## 2014-11-19 DIAGNOSIS — Z86711 Personal history of pulmonary embolism: Secondary | ICD-10-CM | POA: Insufficient documentation

## 2014-11-19 DIAGNOSIS — Z791 Long term (current) use of non-steroidal anti-inflammatories (NSAID): Secondary | ICD-10-CM | POA: Insufficient documentation

## 2014-11-19 DIAGNOSIS — M069 Rheumatoid arthritis, unspecified: Secondary | ICD-10-CM | POA: Insufficient documentation

## 2014-11-19 DIAGNOSIS — J45901 Unspecified asthma with (acute) exacerbation: Secondary | ICD-10-CM | POA: Insufficient documentation

## 2014-11-19 DIAGNOSIS — G894 Chronic pain syndrome: Secondary | ICD-10-CM | POA: Insufficient documentation

## 2014-11-19 DIAGNOSIS — Z9981 Dependence on supplemental oxygen: Secondary | ICD-10-CM | POA: Insufficient documentation

## 2014-11-19 DIAGNOSIS — G4730 Sleep apnea, unspecified: Secondary | ICD-10-CM | POA: Insufficient documentation

## 2014-11-19 LAB — I-STAT TROPONIN, ED: TROPONIN I, POC: 0 ng/mL (ref 0.00–0.08)

## 2014-11-19 LAB — CBC
HCT: 44.2 % (ref 36.0–46.0)
HEMOGLOBIN: 12.8 g/dL (ref 12.0–15.0)
MCH: 25.6 pg — ABNORMAL LOW (ref 26.0–34.0)
MCHC: 29 g/dL — ABNORMAL LOW (ref 30.0–36.0)
MCV: 88.4 fL (ref 78.0–100.0)
Platelets: 208 10*3/uL (ref 150–400)
RBC: 5 MIL/uL (ref 3.87–5.11)
RDW: 17.7 % — ABNORMAL HIGH (ref 11.5–15.5)
WBC: 6.3 10*3/uL (ref 4.0–10.5)

## 2014-11-19 LAB — BASIC METABOLIC PANEL
ANION GAP: 9 (ref 5–15)
BUN: 8 mg/dL (ref 6–20)
CALCIUM: 8.6 mg/dL — AB (ref 8.9–10.3)
CO2: 30 mmol/L (ref 22–32)
CREATININE: 0.46 mg/dL (ref 0.44–1.00)
Chloride: 104 mmol/L (ref 101–111)
Glucose, Bld: 100 mg/dL — ABNORMAL HIGH (ref 65–99)
POTASSIUM: 4.3 mmol/L (ref 3.5–5.1)
SODIUM: 143 mmol/L (ref 135–145)

## 2014-11-19 LAB — BRAIN NATRIURETIC PEPTIDE: B Natriuretic Peptide: 8.7 pg/mL (ref 0.0–100.0)

## 2014-11-19 MED ORDER — IPRATROPIUM BROMIDE 0.02 % IN SOLN
0.5000 mg | Freq: Once | RESPIRATORY_TRACT | Status: AC
  Administered 2014-11-19: 0.5 mg via RESPIRATORY_TRACT
  Filled 2014-11-19: qty 2.5

## 2014-11-19 MED ORDER — LEVOFLOXACIN 500 MG PO TABS: 500.0000 mg | ORAL_TABLET | Freq: Every day | ORAL | Status: DC

## 2014-11-19 MED ORDER — OXYCODONE-ACETAMINOPHEN 10-325 MG PO TABS: 1.0000 | ORAL_TABLET | ORAL | Status: DC | PRN

## 2014-11-19 MED ORDER — IPRATROPIUM-ALBUTEROL 0.5-2.5 (3) MG/3ML IN SOLN
3.0000 mL | Freq: Once | RESPIRATORY_TRACT | Status: AC
  Administered 2014-11-19: 3 mL via RESPIRATORY_TRACT
  Filled 2014-11-19: qty 3

## 2014-11-19 MED ORDER — LEVOFLOXACIN IN D5W 750 MG/150ML IV SOLN
750.0000 mg | Freq: Once | INTRAVENOUS | Status: AC
  Administered 2014-11-19: 750 mg via INTRAVENOUS
  Filled 2014-11-19: qty 150

## 2014-11-19 MED ORDER — ALBUTEROL SULFATE (2.5 MG/3ML) 0.083% IN NEBU: 5.0000 mg | INHALATION_SOLUTION | Freq: Once | RESPIRATORY_TRACT | Status: DC

## 2014-11-19 MED ORDER — MORPHINE SULFATE 4 MG/ML IJ SOLN
6.0000 mg | Freq: Once | INTRAMUSCULAR | Status: AC
  Administered 2014-11-19: 6 mg via INTRAVENOUS
  Filled 2014-11-19: qty 2

## 2014-11-19 MED ORDER — IOHEXOL 350 MG/ML SOLN
100.0000 mL | Freq: Once | INTRAVENOUS | Status: AC | PRN
Start: 2014-11-19 — End: 2014-11-19
  Administered 2014-11-19: 100 mL via INTRAVENOUS

## 2014-11-19 NOTE — ED Notes (Signed)
[facility] Staff at facility noticed that patient was SOB. EMS arrived and got an initial SPO2 of 90% with bilateral wheezing. Pt received 1 duo neb treatment from EMS. Per facility at [facility] pt is non compliant with medications. Pt has a history of PE, and lupus. Pt is non-english speaking.

## 2014-11-19 NOTE — ED Notes (Signed)
Pt waiting for respiratory to teach her how to use the CPAP machine and waiting for her husband to come to take her home.

## 2014-11-19 NOTE — Progress Notes (Signed)
This RT called to Pt room to educate pt on home CPAP. Pt from the clinic with machine delivered by social worker. No order where received for home settings or for the CPAP machine. This RT did not feel comfortable sending this non english speaking pt home with instructions. Informed social worker to have pt return in the AM to the clinic for instructions and settings.

## 2014-11-19 NOTE — Care Management (Addendum)
ED CM contacted by Day shift CM concerning picking up CPAP from [facility] due to patient being in ED. Delivered CPAP to patient she stated that someone told her that someone will provide a demonstration and instruct patient on machine. Day Shift ED CSW note read someone from RT will assist. After speaking with RT I was informed that she is not allowed to set up CPAP for home use, she suggested that patient go pack to clinic for instructions. Patient was instructed to contact [facility] clinic for the assistance with CPAP. Patient verbalized understanding, and will contact [facility] in the am. No further ED CM needs identified.

## 2014-11-19 NOTE — ED Provider Notes (Signed)
CSN: [id number]     Arrival date & time 11/19/14  1244 History   First MD Initiated Contact with Patient 11/19/14 1339     Chief Complaint  Patient presents with  . Shortness of Breath     (Consider location/radiation/quality/duration/timing/severity/associated sxs/prior Treatment) HPI   29 year old morbidly obese Spanish speaking female with history of lupus, PE currently on Xarelto, RA, interstitial lung disease on home O2, asthma presenting for evaluation of SOB.  Hx obtained through [facility] phone interpreter.  History was difficult to obtain as patient is somnolent but arousable. Patient reports for the past 3 days she has had persistent productive cough, wheezing, myalgias, and "bone pain". Symptom is getting progressively worse. She endorse shortness of breath both with resting and with ambulation. She admits she is taking her blood thinner medication as prescribed. Patient denies nausea vomiting diarrhea abdominal pain or back pain or dysuria. She is on home O2.  Past Medical History  Diagnosis Date  . Psoriasis 2010    as a child  . Chronic pain disorder   . Obesity   . Chronic steroid use 2010  . Lupus 2000  . Lupus (systemic lupus erythematosus) 2010  . Rheumatoid arthritis(714.0) 2010  . Hypertension 2010  . Pneumonia "several times"  . Interstitial lung disease     /notes 05/15/2014  . On home oxygen therapy     "2L; 24/7" (05/15/2014)  . Pulmonary embolism     hx/notes 05/15/2014  . History of blood transfusion     "because white cells ate the red cells"  . Headache     "just when I have the pain from aching bones and psorasis and/or lupus symptoms" (05/15/2104)  . 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 Asthma   . Gestational diabetes 2006  . Depression   . Sleep apnea    Past Surgical History  Procedure Laterality Date  . Thigh / knee soft tissue biopsy Right 2011    thigh  . Cesarean section   2010   Family History  Problem Relation Age of Onset  . Diabetes Mother   . Hypertension Mother   . Cancer Neg Hx   . Early death Neg Hx   . Heart disease Neg Hx    History  Substance Use Topics  . Smoking status: Never Smoker   . Smokeless tobacco: Never Used  . Alcohol Use: No   OB History    No data available     Review of Systems  All other systems reviewed and are negative.     Allergies  Review of patient's allergies indicates no known allergies.  Home Medications   Prior to Admission medications   Medication Sig Start Date End Date Taking? Authorizing Provider  albuterol (PROVENTIL HFA;VENTOLIN HFA) 108 (90 BASE) MCG/ACT inhaler Inhale 2 puffs into the lungs every 4 (four) hours as needed for wheezing or shortness of breath. 08/05/14   Nyoka Cowden, MD  amitriptyline (ELAVIL) 10 MG tablet Take 1 tablet (10 mg total) by mouth at bedtime. 11/04/14   Donavan Foil, MD  azaTHIOprine (IMURAN) 50 MG tablet Take 5 tablets (250 mg total) by mouth daily. 10/26/14   Donavan Foil, MD  dextromethorphan-guaiFENesin [facility] DM) 30-600 MG per 12 hr tablet Take 1 tablet by mouth 2 (two) times daily as needed for cough. 11/04/14   Donavan Foil, MD  diphenhydrAMINE (BENADRYL) 2 % cream Apply topically 3 (  three) times daily as needed for itching. 09/18/14   Dessa Phi, MD  hydroxychloroquine (PLAQUENIL) 200 MG tablet Take 1 tablet (200 mg total) by mouth 2 (two) times daily. 10/26/14   Donavan Foil, MD  hydrOXYzine (ATARAX/VISTARIL) 10 MG tablet Take 1 tablet (10 mg total) by mouth every 8 (eight) hours as needed for itching. 09/18/14   Josalyn Funches, MD  mometasone-formoterol (DULERA) 100-5 MCG/ACT AERO Take 2 puffs first thing in am and then another 2 puffs about 12 hours later. 11/04/14   Donavan Foil, MD  naproxen (NAPROSYN) 500 MG tablet Take 1 tablet (500 mg total) by mouth 2 (two) times daily with a meal. 11/04/14   Donavan Foil, MD  oxyCODONE-acetaminophen  (PERCOCET) 10-325 MG per tablet Take 1 tablet by mouth every 4 (four) hours as needed for pain. 11/04/14   Donavan Foil, MD  predniSONE (DELTASONE) 10 MG tablet Take 1-4 tablets (10-40 mg total) by mouth daily with breakfast. 40 mg daily for one week, then 20 mg daily for one week, then 10 mg daily. 11/04/14   Donavan Foil, MD  rivaroxaban (XARELTO) 20 MG TABS tablet Take 1 tablet (20 mg total) by mouth daily with supper. 04/02/14   Josalyn Funches, MD   BP 118/81 mmHg  Pulse 93  Temp(Src) 97.6 F (36.4 C) (Oral)  Resp 31  SpO2 96% Physical Exam  Constitutional: She appears well-developed and well-nourished. No distress.  Mobility obese female, on supplement O2, somnolence but easily arousable  HENT:  Head: Atraumatic.  Right Ear: External ear normal.  Left Ear: External ear normal.  Mouth/Throat: Oropharynx is clear and moist.  Eyes: Conjunctivae are normal.  Neck: Neck supple. No JVD present. No tracheal deviation present.  Cardiovascular: Normal rate and regular rhythm.   Pulmonary/Chest:  Tachypnea with inspiratory/expiratory wheezes throughout and decreased lung sounds to lower lungs.  Abdominal: Soft. There is no tenderness.  Musculoskeletal: She exhibits no edema.  Neurological: She is alert.  Skin: No rash noted.  Psychiatric: She has a normal mood and affect.  Nursing note and vitals reviewed.   ED Course  Procedures (including critical care time)  Patient with history of PE currently on Xarelto.  She is here with active wheezing and symptoms concerning for pneumonia. The chest x-ray shows persistent patchy atelectasis or infiltrate in the left base with worsening perihilar interstitial prominence and bronchitic changes. Plan to treat for community acquired pneumonia however, given your history of blood clot, she will need a chest CT angiogram to rule out acute PE.  Care discussed with Dr. Karma Ganja.   4:25 PM Chest CT without evidence of PE.  Does  Have evidence of  atelectasis or aveolitis.  Since pt has had recent infection, will d/c with levaquin and a short course of pain medication.  Pt to f/u with PCP for further care.  Able to ambulate with O2.  Labs Review Labs Reviewed  CBC - Abnormal; Notable for the following:    MCH 25.6 (*)    MCHC 29.0 (*)    RDW 17.7 (*)    All other components within normal limits  BASIC METABOLIC PANEL - Abnormal; Notable for the following:    Glucose, Bld 100 (*)    Calcium 8.6 (*)    All other components within normal limits  BRAIN NATRIURETIC PEPTIDE  I-STAT TROPOININ, ED    Imaging Review Ct Angio Chest Pe W/cm &/or Wo Cm  11/19/2014   CLINICAL DATA:  Evaluate shortness of  breath, productive cough for 5 days.  EXAM: CT ANGIOGRAPHY CHEST WITH CONTRAST  TECHNIQUE: Multidetector CT imaging of the chest was performed using the standard protocol during bolus administration of intravenous contrast. Multiplanar CT image reconstructions and MIPs were obtained to evaluate the vascular anatomy.  CONTRAST:  OMNIPAQUE IOHEXOL 350 MG/ML SOLN  COMPARISON:  07/29/2014  FINDINGS: CT CHEST FINDINGS  Mediastinum/Nodes: No mediastinal, hilar, or axillary adenopathy. Heart is normal size. Aorta is normal caliber. No filling defects in the pulmonary arteries to suggest pulmonary emboli.  Lungs/Pleura: Very low lung volumes. Elevation of the right hemidiaphragm. Areas of scratch head ground-glass opacities throughout the lungs could reflect areas of atelectasis or alveolitis. No pleural effusions.  Chest wall: Chest wall soft tissues are unremarkable.  Upper abdomen: No acute findings in the visualized upper abdomen.  Musculoskeletal: No acute bony abnormality or focal bone lesion.  Review of the MIP images confirms the above findings.  IMPRESSION: Low lung volumes with elevation of the right hemidiaphragm. Ground-glass opacities throughout the lungs could reflect atelectasis or areas of alveolitis.  No evidence of pulmonary embolus.    Electronically Signed   By: Charlett Nose M.D.   On: 11/19/2014 16:14   Dg Chest Port 1 View  11/19/2014   CLINICAL DATA:  Asthma, pneumonia  EXAM: PORTABLE CHEST - 1 VIEW  COMPARISON:  10/31/2014  FINDINGS: Cardiomegaly. Chronic elevation of the right hemidiaphragm. There is worsening perihilar interstitial prominence and bronchitic changes. Persistent patchy atelectasis or infiltrate in left base.  IMPRESSION: Chronic elevation of the right hemidiaphragm. There is worsening perihilar interstitial prominence and bronchitic changes. Persistent patchy atelectasis or infiltrate in left base.   Electronically Signed   By: Natasha Mead M.D.   On: 11/19/2014 13:36     EKG Interpretation   Date/Time:  Thursday Nov 19 2014 13:08:34 EDT Ventricular Rate:  97 PR Interval:  127 QRS Duration: 88 QT Interval:  376 QTC Calculation: 478 R Axis:   67 Text Interpretation:  Sinus rhythm Minimal ST depression, inferior leads  Borderline prolonged QT interval No significant change since last tracing  Confirmed by [facility]  MD, MARTHA [phone]) on 11/19/2014 3:05:41 PM      MDM   Final diagnoses:  Shortness of breath    BP 128/78 mmHg  Pulse 99  Temp(Src) 97.6 F (36.4 C) (Oral)  Resp 19  SpO2 95%  LMP 11/19/2014 (Exact Date)  I have reviewed nursing notes and vital signs. I personally reviewed the imaging tests through PACS system  I reviewed available ER/hospitalization records thought the EMR     Fayrene Helper, PA-C 11/19/14 1626  Jerelyn Scott, MD 11/20/14 [phone]

## 2014-11-19 NOTE — Patient Instructions (Addendum)
Need to go to ED for treatment. Oxygen level is very low.

## 2014-11-19 NOTE — Discharge Instructions (Signed)
Some infection noted in your chest.  Take antibiotic as prescribed and follow up with your doctor for further care.  Falta de aire (Shortness of Breath) La falta de aire significa que tiene dificultad para respirar. Tambin puede significar que tiene un problema mdico. Debe solicitar atencin mdica de inmediato si siente que Company secretary. CAUSAS   Insuficiente oxgeno en el aire, como en las grandes alturas o un ambiente lleno de humo.  Ciertas enfermedades pulmonares, infecciones o problemas.  Enfermedades o afecciones cardacas, como angina de pecho o insuficiencia cardaca.  Nivel bajo de glbulos rojos (anemia).  Condicin fsica deficiente, que puede provocar falta de aire al hacer ejercicio.  Lesiones o entumecimiento en el pecho o en la espalda.  Tener sobrepeso.  Fumar.  Ansiedad, que puede hacer que sienta que no tiene aire suficiente. DIAGNSTICO  A menudo, los problemas mdicos graves se encuentran durante el examen fsico. Para determinar porqu sufre de falta de aire podrn realizarle diferentes pruebas. Entre ellas:  Radiografa de trax.  Pruebas funcionales respiratorias.  Anlisis de Espy.  Un electrocardiograma (ECG).  Un electrocardiograma ambulatorio. Un ECG ambulatorio registra los patrones de los latidos cardacos durante 24horas.  Prueba de ejercicio.  Un ecocardiograma transtorcico (ETT). Durante Management consultant, se usan ondas sonoras para evaluar el flujo de la sangre a travs del corazn.  Un ecocardiograma transesofgico (ETE).  Diagnsticos por imgenes. Puede ser que el mdico no encuentre una causa para su falta de aire despus del examen. En este caso, es importante que concurra a un examen de control, segn las indicaciones del mdico.  TRATAMIENTO  El tratamiento de la falta de aire depende de la causa de sus sntomas y Advertising account planner. INSTRUCCIONES PARA EL CUIDADO EN EL HOGAR   No fumar. Fumar es una causa frecuente de  la falta de aire. Si fuma, solicite ayuda para dejar de hacerlo.  Evite estar cerca de sustancias qumicas o de aquellas cosas que puedan interferir en la respiracin, como vapores de pinturas y polvo.  Descanse todo lo que sea necesario. Retome sus actividades habituales gradualmente.  Si le indicaron medicamentos, tmelos como se los han prescrito durante todo el tiempo indicado. Estos incluyen el oxgeno y los medicamentos inhalados.  Cumpla con todas las visitas de control, segn le indique su mdico. SOLICITE ATENCIN MDICA SI:   Su afeccin no mejora en el tiempo esperado.  Le cuesta hacer las actividades cotidianas, aun si ha descansado lo suficiente.  Aparece algn sntoma nuevo. SOLICITE ATENCIN MDICA DE INMEDIATO SI:   La falta de aire empeora.  Se siente aturdido, se desmaya o tiene tos que no controla con medicamentos.  Elimina sangre al toser.  Siente dolor al respirar.  Tiene dolor de pecho o en los brazos, hombros o abdomen.  Tiene fiebre.  No logra subir escaleras o realizar ejercicio del modo en que lo haca habitualmente. ASEGRESE DE QUE:  Comprende estas instrucciones.  Controlar su afeccin.  Recibir ayuda de inmediato si no mejora o si empeora. Document Released: 03/22/2005 Document Revised: 06/17/2013 [facility] Patient Information 2015 [place], [place]. This information is not intended to replace advice given to you by your health care provider. Make sure you discuss any questions you have with your health care provider.

## 2014-11-19 NOTE — Progress Notes (Signed)
LCSW called by [employer] [facility] SW regarding patient's CPAP machine. CM Burna Mortimer will pick up machine and RN working with patient will call respiratory to show patient how to use machine.  No other needs at this time.  Deretha Emory, MSW Clinical Social Work: Emergency Room [phone]

## 2014-11-19 NOTE — Progress Notes (Signed)
Subjective:     Patient ID: Donna Hall, female   DOB: 1986-03-20, 29 y.o.   MRN: [id number]  HPI   Patient presents today with an exacerbation of wheezing and coughing, which has been going on for several day. She has a history of Cough variant asthma vs. UACS. She also has a history of PE and recent pneumonia. She has home health and is ordered O2 for home use.  She drives herself here today without her O2 and must be helped into building. She bring 2 small children with her.   Review of Systems   Negative for fever, chills. Positive for cough and fever.     Objective:   Physical Exam   Diffuse wheezes and rhonci, with sl labored breathing and cough. Pulse OX 87%     Assessment:    Asthma Exacerbation   Plan:     Transfer to ED by EMS   Henrietta Hoover, FNP-BC

## 2014-11-19 NOTE — ED Notes (Signed)
Pt from home for eval of sob and cough with production x5 days, pt states she sometimes wears oxygen as needed however has been having to wear it more and also reports having to sit up more to sleep. Pt denies any hx of CHF, nad noted. Pt denies any cp.

## 2014-11-19 NOTE — ED Notes (Signed)
Pt noted to be 89-90% on room air, pt given 2L nasal cannula and increased to 96-97%

## 2014-11-20 ENCOUNTER — Encounter: Payer: Self-pay | Admitting: *Deleted

## 2014-11-20 NOTE — Progress Notes (Signed)
Patient ID: Donna Hall, female   DOB: 11/22/1985, 29 y.o.   MRN: [id number]   Patient walked into the clinic with her new CPAP machine.  [employer] id# [id number] used.  Instructed patient on how to assemble the machine, plug it in and place it over her head in position.  Patient was able to demonstrate how to put it on.  Patient was instructed on how machine will help her to breathe more effectively every time she sleeps.  She was told that she should wear the machine every time that she sleeps.  She was also shown how to clean the machine once a week.  Patient verbalized understanding.

## 2014-12-07 ENCOUNTER — Ambulatory Visit: Payer: MEDICAID

## 2014-12-25 ENCOUNTER — Telehealth: Payer: Self-pay | Admitting: Family Medicine

## 2014-12-25 DIAGNOSIS — IMO0002 Reserved for concepts with insufficient information to code with codable children: Secondary | ICD-10-CM

## 2014-12-25 DIAGNOSIS — M329 Systemic lupus erythematosus, unspecified: Secondary | ICD-10-CM

## 2014-12-25 MED ORDER — PREDNISONE 10 MG PO TABS: 10.0000 mg | ORAL_TABLET | Freq: Every day | ORAL | Status: DC

## 2014-12-25 NOTE — Telephone Encounter (Signed)
Pt is requesting a refill for:  naproxen (NAPROSYN) 500 MG tablet predniSONE (DELTASONE) 10 MG tablet

## 2014-12-25 NOTE — Telephone Encounter (Signed)
Prednisone refilled. Long term naproxen not recommended for patient since she takes xarelto

## 2014-12-29 NOTE — Telephone Encounter (Signed)
Pt aware.

## 2014-12-30 ENCOUNTER — Other Ambulatory Visit: Payer: Self-pay | Admitting: Family Medicine

## 2014-12-30 ENCOUNTER — Telehealth: Payer: Self-pay | Admitting: Family Medicine

## 2014-12-30 DIAGNOSIS — G894 Chronic pain syndrome: Secondary | ICD-10-CM

## 2014-12-30 NOTE — Telephone Encounter (Signed)
Patient came into facility to request a med refill for Tramadol, please f/u with pt.

## 2014-12-31 MED ORDER — ACETAMINOPHEN-CODEINE #3 300-30 MG PO TABS: 1.0000 | ORAL_TABLET | Freq: Three times a day (TID) | ORAL | Status: DC | PRN

## 2014-12-31 NOTE — Telephone Encounter (Signed)
Please inform patient  Tramadol was associated with itching in the past  Plan  Tylenol #3 for pain control instead, Rx ordered

## 2014-12-31 NOTE — Telephone Encounter (Signed)
Pt aware Rx at front office  

## 2015-01-07 ENCOUNTER — Other Ambulatory Visit: Payer: Self-pay

## 2015-01-07 ENCOUNTER — Ambulatory Visit: Payer: Medicaid Other | Admitting: Family Medicine

## 2015-01-28 ENCOUNTER — Emergency Department (HOSPITAL_COMMUNITY)
Admission: EM | Admit: 2015-01-28 | Discharge: 2015-01-28 | Disposition: A | Discharge status: Home / Self Care | Payer: Medicaid Other | Attending: Emergency Medicine | Admitting: Emergency Medicine

## 2015-01-28 ENCOUNTER — Emergency Department (HOSPITAL_COMMUNITY): Payer: Medicaid Other

## 2015-01-28 DIAGNOSIS — Z872 Personal history of diseases of the skin and subcutaneous tissue: Secondary | ICD-10-CM | POA: Insufficient documentation

## 2015-01-28 DIAGNOSIS — Z792 Long term (current) use of antibiotics: Secondary | ICD-10-CM | POA: Insufficient documentation

## 2015-01-28 DIAGNOSIS — Z7901 Long term (current) use of anticoagulants: Secondary | ICD-10-CM | POA: Insufficient documentation

## 2015-01-28 DIAGNOSIS — Z86711 Personal history of pulmonary embolism: Secondary | ICD-10-CM | POA: Insufficient documentation

## 2015-01-28 DIAGNOSIS — F329 Major depressive disorder, single episode, unspecified: Secondary | ICD-10-CM | POA: Insufficient documentation

## 2015-01-28 DIAGNOSIS — Z79899 Other long term (current) drug therapy: Secondary | ICD-10-CM | POA: Insufficient documentation

## 2015-01-28 DIAGNOSIS — Z8739 Personal history of other diseases of the musculoskeletal system and connective tissue: Secondary | ICD-10-CM | POA: Insufficient documentation

## 2015-01-28 DIAGNOSIS — R000 Tachycardia, unspecified: Secondary | ICD-10-CM | POA: Insufficient documentation

## 2015-01-28 DIAGNOSIS — Z8701 Personal history of pneumonia (recurrent): Secondary | ICD-10-CM | POA: Insufficient documentation

## 2015-01-28 DIAGNOSIS — A630 Anogenital (venereal) warts: Secondary | ICD-10-CM

## 2015-01-28 DIAGNOSIS — Z9981 Dependence on supplemental oxygen: Secondary | ICD-10-CM | POA: Insufficient documentation

## 2015-01-28 DIAGNOSIS — G894 Chronic pain syndrome: Secondary | ICD-10-CM | POA: Insufficient documentation

## 2015-01-28 DIAGNOSIS — K625 Hemorrhage of anus and rectum: Secondary | ICD-10-CM | POA: Insufficient documentation

## 2015-01-28 DIAGNOSIS — J45909 Unspecified asthma, uncomplicated: Secondary | ICD-10-CM | POA: Insufficient documentation

## 2015-01-28 DIAGNOSIS — I10 Essential (primary) hypertension: Secondary | ICD-10-CM | POA: Insufficient documentation

## 2015-01-28 LAB — I-STAT CHEM 8, ED
BUN: 7 mg/dL (ref 6–20)
CALCIUM ION: 1.18 mmol/L (ref 1.12–1.23)
CHLORIDE: 100 mmol/L — AB (ref 101–111)
CREATININE: 0.7 mg/dL (ref 0.44–1.00)
GLUCOSE: 101 mg/dL — AB (ref 65–99)
HEMATOCRIT: 45 % (ref 36.0–46.0)
Hemoglobin: 15.3 g/dL — ABNORMAL HIGH (ref 12.0–15.0)
Potassium: 3.8 mmol/L (ref 3.5–5.1)
SODIUM: 142 mmol/L (ref 135–145)
TCO2: 27 mmol/L (ref 0–100)

## 2015-01-28 LAB — POC OCCULT BLOOD, ED: Fecal Occult Bld: POSITIVE — AB

## 2015-01-28 MED ORDER — OXYCODONE-ACETAMINOPHEN 5-325 MG PO TABS
1.0000 | ORAL_TABLET | Freq: Once | ORAL | Status: AC
  Administered 2015-01-28: 1 via ORAL
  Filled 2015-01-28: qty 1

## 2015-01-28 MED ORDER — HYDROCORTISONE ACETATE 25 MG RE SUPP: 25.0000 mg | Freq: Two times a day (BID) | RECTAL | Status: DC

## 2015-01-28 NOTE — Discharge Instructions (Signed)
Please follow up with your doctor for close management of your genital wart, and lupus flair.  Use anusol suppository to help decrease rectal discomfort  Verrugas genitales (Genital Warts)  Las verrugas genitales son Neomia Dear enfermedad de transmisin sexual. Pueden aparecer como pequeos bultos en los tejidos de la zona genital.  CAUSAS Las verrugas genitales ests ocasionadas por un virus denominado VPH (virus del papiloma humano) Es la ms frecuente entre las enfermedades de transmisin sexual e infeccin de los rganos sexuales. Esta infeccin se disemina por Art gallery manager sexuales sin proteccin con una persona infectada. Puede transmitirse practicando sexo vaginal, anal u oral. Muchas personas no saben que estn infectadas. Pueden padecer la infeccin durante aos, y manifestarse pocos o ningn sntoma (problema) Tambin pueden transmitir la infeccin a sus parejas sin saberlo. SNTOMAS  Picazn e irritacin de la zona genital.  Verrugas que sangran.  Relaciones sexuales dolorosas. DIAGNSTICO [employer] se reconocen a simple vista en la vagina, en la vulva, el perineo, el ano y el recto. Algunas pruebas pueden diagnosticar las verrugas genitales:  Papanicolau.  Examen de Lauris Poag de tejido biopsia.  Colposcopa Se trata de un instrumento que aumenta el tamao de lo que se observa al examinar la vagina y el cuello uterino. Al aplicar ciertas soluciones, las clulas de VPH cambian de color. TRATAMIENTO Las verrugas pueden eliminarse del siguiente modo:   Aplicando sustancias qumicas como cantaridina o podofilina.  Nitrgeno lquido congelante (crioterapia).  Inmunoterapia con inyecciones de cndida o tricofiton.  Tratamiento con lser.  Quemarlas a Annette Stable de una sonda con electricidad (electrocauterizacin).  Inyecciones de interfern.  Ciruga. PREVENCIN La vacuna contra el VPH puede ayudar a prevenir la infeccin por VPH que causa verrugas genitales y cncer del  cuello uterino. Se recomienda administrar la vacuna a personas de entre 9 y 26 aos de edad. La vacuna puede no funcionar correctamente o no hacer efecto en absoluto si usted ya tiene VPH. Estas vacunas no deben administrarse a mujeres embarazadas. INSTRUCCIONES PARA EL CUIDADO DOMICILIARIO  Es importante seguir las indicaciones del profesional que la asiste. Las verrugas no desaparecern sin TEFL teacher. Generalmente es necesario repetir el tratamiento para eliminarlas. En general, an despus de que las pruebas fsicas de las verrugas hayan desaparecido, el tejido normal subyacente sigue infectado.  No intente tratar las verrugas genitales con el mismo medicamento que se [place] para las verrugas de las manos. Este tipo de medicamento es muy fuerte y puede quemar la piel de la zona genital, ocasionando ms daos.  Infrmele a sus parejas sexuales con las que [place] mantenido relaciones antes del tratamiento, que usted sufre de Civil Service fast streamer. Podran tambin estar infectados y Network engineer.  Evite el contacto sexual mientras IT sales professional.  No frote o rasque las verrugas. Esta es una infeccin viral y puede diseminarse a otras partes del organismo (auto inoculacin).  Las mujeres que sufren de verrugas genitales debern hacer un control para Engineer, manufacturing systems cervical (Papanicolau) al menos una vez al ao. Este tipo de cncer es de crecimiento lento y puede curarse si se detecta en una etapa temprana. Las posibilidades de Higher education careers adviser en las mujeres que han sufrido HPV.  Informe a su obstetra en caso de embarazo. Este virus puede transmitirse al tracto respiratorio del beb. Convrselo con el profesional que lo asiste.  Siempre use condones en sus relaciones sexuales. Luego del tratamiento, use preservativos para Immunologist.  Para la picazn, puede usar medicamentos de venta libre; consulte con su mdico  antes de utilizarlos. SOLICITE  ATENCIN MDICA SI:  La piel de la zona tratada se vuelve roja, se hincha o duele.  Tiene fiebre.  Siente un Engineer, maintenance (IT).  Siente pequeos bultos (como granos) alrededor de la zona genital.  Licensed conveyancer o hemorragias durante las relaciones sexuales. EST SEGURO QUE:   Comprende las instrucciones para el alta mdica.  Controlar su enfermedad.  Solicitar atencin mdica de inmediato segn las indicaciones. Document Released: 03/22/2005 Document Revised: 09/04/2011 [facility] Patient Information 2015 [place], [place]. This information is not intended to replace advice given to you by your health care provider. Make sure you discuss any questions you have with your health care provider.

## 2015-01-28 NOTE — ED Notes (Signed)
Pt presents with 1 week h/o cough and shortness of breath.  Pt reports upper back pain; reports cough is dry;  Pt also reports 2 month h/o rectal bleeding and feeling of a "welt" that is getting bigger.  Pt denies any abdominal pain or nausea or dysuria.

## 2015-01-28 NOTE — ED Provider Notes (Signed)
CSN: [id number]     Arrival date & time 01/28/15  1449 History   First MD Initiated Contact with Patient 01/28/15 1735     No chief complaint on file.    (Consider location/radiation/quality/duration/timing/severity/associated sxs/prior Treatment) The history is provided by the patient. The history is limited by a language barrier. A language interpreter was used.     29 year old morbidly obese female with history of lupus, prior PE currently on Xarelto, asthma, interstitial lung disease on home O2 who presents for evaluation of rectal pain and rectal bleeding. Patient is Hispanic speaking. History obtained through [facility] phone interpreter. Patient reports for the past 2 months she has had persistent rectal discomfort with bleeding. Initially she only notices blood when she was but now but is dripping into the toilet bowl. Report mild rectal itchiness. She has been using her psoriasis ointment on her rectum without improvement. Furthermore she also complaining of joint pains to both knees for the past week. Pain is sharp, achy, persistent, similar to her lupus flare. She endorse one week of cough that is nonproductive accompanied with shortness of breath. She is on home oxygenation. She denies having fever, chills, productive cough, worsening SOB, hemoptysis, abdominal pain, dysuria. Patient states her primary concern is her rectal pain. She denies lightheadedness or dizziness.    Past Medical History  Diagnosis Date  . Psoriasis 2010    as a child  . Chronic pain disorder   . Obesity   . Chronic steroid use 2010  . Lupus 2000  . Lupus (systemic lupus erythematosus) 2010  . Rheumatoid arthritis(714.0) 2010  . Hypertension 2010  . Pneumonia "several times"  . Interstitial lung disease     /notes 05/15/2014  . On home oxygen therapy     "2L; 24/7" (05/15/2014)  . Pulmonary embolism     hx/notes 05/15/2014  . History of blood transfusion     "because white cells ate the red cells"  .  Headache     "just when I have the pain from aching bones and psorasis and/or lupus symptoms" (05/15/2104)  . 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 Asthma   . Gestational diabetes 2006  . Depression   . Sleep apnea    Past Surgical History  Procedure Laterality Date  . Thigh / knee soft tissue biopsy Right 2011    thigh  . Cesarean section  2010   Family History  Problem Relation Age of Onset  . Diabetes Mother   . Hypertension Mother   . Cancer Neg Hx   . Early death Neg Hx   . Heart disease Neg Hx    History  Substance Use Topics  . Smoking status: Never Smoker   . Smokeless tobacco: Never Used  . Alcohol Use: No   OB History    No data available     Review of Systems  All other systems reviewed and are negative.     Allergies  Review of patient's allergies indicates no known allergies.  Home Medications   Prior to Admission medications   Medication Sig Start Date End Date Taking? Authorizing Provider  acetaminophen-codeine (TYLENOL #3) 300-30 MG per tablet Take 1 tablet by mouth every 8 (eight) hours as needed for moderate pain. 12/31/14   Josalyn Funches, MD  albuterol (PROVENTIL HFA;VENTOLIN HFA) 108 (90 BASE) MCG/ACT inhaler Inhale 2 puffs into the lungs every 4 (four) hours as needed for wheezing or  shortness of breath. 08/05/14   Nyoka Cowden, MD  amitriptyline (ELAVIL) 10 MG tablet Take 1 tablet (10 mg total) by mouth at bedtime. 11/04/14   Donavan Foil, MD  azaTHIOprine (IMURAN) 50 MG tablet Take 5 tablets (250 mg total) by mouth daily. 10/26/14   Donavan Foil, MD  diphenhydrAMINE (BENADRYL) 2 % cream Apply topically 3 (three) times daily as needed for itching. 09/18/14   Dessa Phi, MD  hydroxychloroquine (PLAQUENIL) 200 MG tablet Take 1 tablet (200 mg total) by mouth 2 (two) times daily. 10/26/14   Donavan Foil, MD  hydrOXYzine (ATARAX/VISTARIL) 10 MG tablet Take 1 tablet (10 mg  total) by mouth every 8 (eight) hours as needed for itching. 09/18/14   Josalyn Funches, MD  levofloxacin (LEVAQUIN) 500 MG tablet Take 1 tablet (500 mg total) by mouth daily. 11/19/14   Fayrene Helper, PA-C  mometasone-formoterol (DULERA) 100-5 MCG/ACT AERO Take 2 puffs first thing in am and then another 2 puffs about 12 hours later. 11/04/14   Donavan Foil, MD  oxyCODONE-acetaminophen (PERCOCET) 10-325 MG per tablet Take 1 tablet by mouth every 4 (four) hours as needed for pain. 11/19/14   Fayrene Helper, PA-C  predniSONE (DELTASONE) 10 MG tablet Take 1 tablet (10 mg total) by mouth daily with breakfast. 40 mg daily for one week, then 20 mg daily for one week, then 10 mg daily. 12/25/14   Dessa Phi, MD  rivaroxaban (XARELTO) 20 MG TABS tablet Take 1 tablet (20 mg total) by mouth daily with supper. 04/02/14   Josalyn Funches, MD   BP 117/56 mmHg  Pulse 114  Temp(Src) 99.4 F (37.4 C) (Oral)  Resp 20  SpO2 93%  LMP 01/18/2015 (Approximate) Physical Exam  Constitutional: She appears well-developed and well-nourished. No distress.  Morbidly obese Hispanic female, nontoxic.  HENT:  Head: Atraumatic.  Eyes: Conjunctivae are normal.  Neck: Neck supple. No JVD present.  Cardiovascular:  Tachycardia without murmurs rubs or gallops  Pulmonary/Chest:  Mildly tachypneic and tachycardic with decreased lung sounds throughout but no rales, or wheezes heard.  Abdominal: Soft. There is no tenderness.  Genitourinary:  Chaperone present during exam. Patient has significant evidence of condyloma acuminata with surrounding bloody discharge. Rectal exam is limited due to discomfort.  Musculoskeletal: She exhibits tenderness (tenderness to bilateral knee on palpation with normal knee flexion extension and no deformity. Normal skin changes, no obvious effusion or rash.).  Neurological: She is alert.  Skin: No rash noted.  Psychiatric: She has a normal mood and affect.  Nursing note and vitals reviewed.   ED  Course  Procedures (including critical care time)  Patient is here with multiple complaints. Her primary concern is rectal pain and rectal bleeding currently on Xarelto. She has significant evidence of condyloma acuminata involving her rectal region. She does have blood-tinged discharge, coming from the irritation of the condyloma. This will need to be addressed by her primary care provider. Plan to check her hemoglobin to rule out anemia  Patient has bilateral knee pain secondary to lupus flare. No sign of infection concerning for septic joint or gout. Pain medication given.  Her shortness of breath is at baseline according to patient. Chest x-ray today showing no evidence to suggest pneumonia. She does not have fever or productive cough to support pneumonia.  Care discussed with Dr. Clarene Duke.  8:16 PM Patient acknowledged that she has noticed her rectal skin changes (condyloma acuminata) for approximately 3 weeks. She does admits having anal sexual intercourse  approximately a year ago with her husband. She denies any history of HIV or AIDS. I discussed the findings with patient and recommend patient to follow-up with PCP for further management of her condition. I discussed some of the treatment such as physical destruction, immunologic therapy and surgical therapy as treatment option. These options can be best discussed with her primary care provider. No acute emergent medical condition identified today. No evidence of anemia. No worsening shortness of breath or signs of lung infection. All questions answered to patient's satisfaction.   Labs Review Labs Reviewed - No data to display  Imaging Review Dg Chest 2 View  01/28/2015   CLINICAL DATA:  Productive cough for 4 days  EXAM: CHEST  2 VIEW  COMPARISON:  11/19/2014  FINDINGS: Hypoventilation which is chronic. There is chronic diffuse interstitial coarsening in this patient with history of interstitial lung disease. There is a focal opacity in the  left upper lobe which is chronic based on previous radiography and CT. No definitive pneumonia, but limited by hypoventilation and patient size. No edema, effusion, or air leak. No cardiomegaly for technique.  IMPRESSION: 1. Interstitial lung disease with chronic hypoventilation. 2. When accounting for chronic scar at the left apex, no definitive pneumonia. 3. Note that due to chronic lung disease, early bronchopneumonia could easily be obscured.   Electronically Signed   By: Marnee Spring M.D.   On: 01/28/2015 15:35     EKG Interpretation None      MDM   Final diagnoses:  Rectal bleed  Condyloma acuminatum of perianal region    BP 108/61 mmHg  Pulse 86  Temp(Src) 99.1 F (37.3 C) (Oral)  Resp 17  SpO2 95%  LMP 01/18/2015 (Approximate)  I have reviewed nursing notes and vital signs. I personally viewed the imaging tests through PACS system and agrees with radiologist's intepretation I reviewed available ER/hospitalization records through the EMR     Fayrene Helper, PA-C 01/28/15 2050  Laurence Spates, MD 01/29/15 [phone]

## 2015-01-28 NOTE — ED Notes (Signed)
Pt stable, ambulatory, states understanding of discharge instructions 

## 2015-02-05 ENCOUNTER — Ambulatory Visit: Payer: Self-pay | Attending: Family Medicine | Admitting: Physician Assistant

## 2015-02-05 ENCOUNTER — Encounter: Payer: Self-pay | Admitting: Physician Assistant

## 2015-02-05 VITALS — BP 107/76 | HR 112 | Temp 98.8°F | Resp 16 | Ht 62.75 in | Wt 299.6 lb

## 2015-02-05 DIAGNOSIS — Z7952 Long term (current) use of systemic steroids: Secondary | ICD-10-CM | POA: Insufficient documentation

## 2015-02-05 DIAGNOSIS — Z79899 Other long term (current) drug therapy: Secondary | ICD-10-CM | POA: Insufficient documentation

## 2015-02-05 DIAGNOSIS — Z86711 Personal history of pulmonary embolism: Secondary | ICD-10-CM | POA: Insufficient documentation

## 2015-02-05 DIAGNOSIS — A630 Anogenital (venereal) warts: Secondary | ICD-10-CM | POA: Insufficient documentation

## 2015-02-05 DIAGNOSIS — Z7901 Long term (current) use of anticoagulants: Secondary | ICD-10-CM | POA: Insufficient documentation

## 2015-02-05 DIAGNOSIS — L409 Psoriasis, unspecified: Secondary | ICD-10-CM

## 2015-02-05 DIAGNOSIS — M329 Systemic lupus erythematosus, unspecified: Secondary | ICD-10-CM

## 2015-02-05 MED ORDER — AZATHIOPRINE 50 MG PO TABS: 250.0000 mg | ORAL_TABLET | Freq: Every day | ORAL | Status: DC

## 2015-02-05 MED ORDER — HYDROXYZINE HCL 10 MG PO TABS
10.0000 mg | ORAL_TABLET | Freq: Three times a day (TID) | ORAL | Status: DC | PRN
Start: 2015-02-05 — End: 2015-09-09

## 2015-02-05 MED ORDER — IMIQUIMOD 5 % EX CREA: TOPICAL_CREAM | CUTANEOUS | Status: DC

## 2015-02-05 NOTE — Progress Notes (Signed)
Patient here for ED follow up for rectal bleeding.   Since being out of the hospital, patient reports "it still hurts and burn a lot especially when sitting." Patient reports it is bleeding a lot. Patient reports when using a pad it smells really bad.   Rectal pain rated at a 9, described as burning.  Pain has been present for about 4 months now. Pain is constant.   Right knee pain rated at a 10, describbed as something pricking.   Benadryl cream doesn't help.   Patient has only taken prednisone and tylenol #3 today.

## 2015-02-05 NOTE — Progress Notes (Addendum)
Chief Complaint: "I was in the ED last week"  Subjective: This is a 29 year old Hispanic speaking female who is presenting to our clinic at the emergency department visit on 01/29/2015. At that time she presented with rectal bleeding and rectal pain. Her pelvic exam showed evidence of condyloma acuminata. She was not treated at that point. She was told to come here to get a referral for gynecology examination and treatment. She's had extensive amount of pain since then. She still having some active bleeding. Her bleeding is complicated by the fact that she has a history of pulmonary emboli and is on chronic Xarelto therapy.  Her hemoglobin on the emergency department visit was 15.3.  She is also requesting a refill on a couple of her medications. Specifically she is requesting Atarax and Azathroprine. She is asking for Tylenol No. 3 as well but this is already pharmacy waiting for her.  ROS:  GEN: denies fever or chills, denies change in weight Skin: denies lesions or rashes ABD: denies abd pain, N or V    Objective:  Filed Vitals:   02/05/15 1040 02/05/15 1043  BP:  107/76  Pulse:  112  Temp:  98.8 F (37.1 C)  TempSrc:  Oral  Resp:  16  Height: 5' 2.75" (1.594 m)   Weight: 299 lb 9.6 oz (135.898 kg)   SpO2:  91%    Physical Exam:  General: in no acute distress. HEENT: no pallor, no icterus, moist oral mucosa, no JVD, no lymphadenopathy Heart: Normal  s1 &s2  Tachy and regular, without murmurs, rubs, gallops. Abdomen: obese, Soft, nontender, nondistended, positive bowel sounds. No pelvic exam-done last week   Pertinent Lab Results:none   Medications: Prior to Admission medications   Medication Sig Start Date End Date Taking? Authorizing Provider  albuterol (PROVENTIL HFA;VENTOLIN HFA) 108 (90 BASE) MCG/ACT inhaler Inhale 2 puffs into the lungs every 4 (four) hours as needed for wheezing or shortness of breath. 08/05/14  Yes Nyoka Cowden, MD  azaTHIOprine (IMURAN) 50  MG tablet Take 5 tablets (250 mg total) by mouth daily. 02/05/15  Yes Tiffany Netta Cedars, PA-C  diphenhydrAMINE (BENADRYL) 2 % cream Apply topically 3 (three) times daily as needed for itching. 09/18/14  Yes Dessa Phi, MD  hydroxychloroquine (PLAQUENIL) 200 MG tablet Take 1 tablet (200 mg total) by mouth 2 (two) times daily. 10/26/14  Yes Adrian Blackwater Moding, MD  hydrOXYzine (ATARAX/VISTARIL) 10 MG tablet Take 1 tablet (10 mg total) by mouth every 8 (eight) hours as needed for itching. 02/05/15  Yes Tiffany Netta Cedars, PA-C  predniSONE (DELTASONE) 10 MG tablet Take 1 tablet (10 mg total) by mouth daily with breakfast. 40 mg daily for one week, then 20 mg daily for one week, then 10 mg daily. 12/25/14  Yes Dessa Phi, MD  rivaroxaban (XARELTO) 20 MG TABS tablet Take 1 tablet (20 mg total) by mouth daily with supper. 04/02/14  Yes Josalyn Funches, MD  acetaminophen-codeine (TYLENOL #3) 300-30 MG per tablet Take 1 tablet by mouth every 8 (eight) hours as needed for moderate pain. 12/31/14   Josalyn Funches, MD  amitriptyline (ELAVIL) 10 MG tablet Take 1 tablet (10 mg total) by mouth at bedtime. 11/04/14   Donavan Foil, MD  hydrocortisone (ANUSOL-HC) 25 MG suppository Place 1 suppository (25 mg total) rectally 2 (two) times daily. For 7 days Patient not taking: Reported on 02/05/2015 01/28/15   Fayrene Helper, PA-C  imiquimod Mathis Dad) 5 % cream Apply topically 3 (three) times a week.  Apply to affected area 3 times per week 02/05/15   Vivianne Master, PA-C  levofloxacin (LEVAQUIN) 500 MG tablet Take 1 tablet (500 mg total) by mouth daily. Patient not taking: Reported on 02/05/2015 11/19/14   Fayrene Helper, PA-C  mometasone-formoterol (DULERA) 100-5 MCG/ACT AERO Take 2 puffs first thing in am and then another 2 puffs about 12 hours later. 11/04/14   Donavan Foil, MD  oxyCODONE-acetaminophen (PERCOCET) 10-325 MG per tablet Take 1 tablet by mouth every 4 (four) hours as needed for pain. 11/19/14   Fayrene Helper, PA-C     Assessment: 1. Condyloma acuminata 2. History of pulmonary emboli on chronic Xarelto   Plan: Aldara ointment GYN referral for cryosurgery or surgical removal Refilled meds as above Continue Xarelto (H&H stable) for now. Has been on for > 1 yr, can likely DC soon but will defer to PCP. Dr. Armen Pickup in 2 weeks  Follow up:2 weeks  The patient was given clear instructions to go to ER or return to medical center if symptoms don't improve, worsen or new problems develop. The patient verbalized understanding. The patient was told to call to get lab results if they haven't heard anything in the next week.   This note has been created with Education officer, environmental. Any transcriptional errors are unintentional.   Scot Jun, PA-C 02/05/2015, 11:25 AM

## 2015-02-10 ENCOUNTER — Ambulatory Visit: Payer: Self-pay | Attending: Family Medicine

## 2015-02-15 ENCOUNTER — Ambulatory Visit: Payer: Medicaid Other | Admitting: Family Medicine

## 2015-02-18 ENCOUNTER — Other Ambulatory Visit: Payer: Self-pay | Admitting: Physician Assistant

## 2015-02-18 DIAGNOSIS — A630 Anogenital (venereal) warts: Secondary | ICD-10-CM

## 2015-02-22 ENCOUNTER — Encounter: Payer: Self-pay | Admitting: General Surgery

## 2015-03-02 ENCOUNTER — Ambulatory Visit (INDEPENDENT_AMBULATORY_CARE_PROVIDER_SITE_OTHER): Payer: Self-pay | Admitting: General Surgery

## 2015-03-02 ENCOUNTER — Encounter: Payer: Self-pay | Admitting: General Surgery

## 2015-03-02 VITALS — BP 137/79 | HR 112 | Temp 98.3°F | Ht 65.0 in | Wt 297.0 lb

## 2015-03-02 DIAGNOSIS — A630 Anogenital (venereal) warts: Secondary | ICD-10-CM

## 2015-03-02 MED ORDER — PODOFILOX 0.5 % EX GEL: Freq: Two times a day (BID) | CUTANEOUS | Status: DC

## 2015-03-02 NOTE — Progress Notes (Signed)
Surgical Consultation  03/02/2015  Donna Hall is an 29 y.o. female with rectal pain.   Chief Complaint  Patient presents with  . Mass    around anus     HPI: 29 year old female presents to clinic for evaluation of a painful, bleeding, protruding mass from her rectum. She does not speak [place] and history is obtained via Nurse, learning disability. Patient reports that over the last several weeks she has had increasing pain to her rectum that then started to bleed. She's been seen previously and given treatment for hemorrhoids. This treatment did not improve and fact that she thinks worsened her complaints. She's never had anything like this prior and is worried that this could continue to get worse. She denies any fevers, chills, nausea, vomiting, chest pain, shortness of breath, diarrhea, constipation. She is otherwise in her usual state of health.  Past Medical History  Diagnosis Date  . Psoriasis 2010    as a child  . Chronic pain disorder   . Obesity   . Chronic steroid use 2010  . Lupus 2000  . Lupus (systemic lupus erythematosus) 2010  . Rheumatoid arthritis(714.0) 2010  . Hypertension 2010  . Pneumonia "several times"  . Interstitial lung disease     /notes 05/15/2014  . On home oxygen therapy     "2L; 24/7" (05/15/2014)  . Pulmonary embolism     hx/notes 05/15/2014  . History of blood transfusion     "because white cells ate the red cells"  . Headache     "just when I have the pain from aching bones and psorasis and/or lupus symptoms" (05/15/2104)  . 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 Asthma   . Gestational diabetes 2006  . Depression   . Sleep apnea     Past Surgical History  Procedure Laterality Date  . Thigh / knee soft tissue biopsy Right 2011    thigh  . Cesarean section  2010    Family History  Problem Relation Age of Onset  . Diabetes Mother   . Hypertension Mother   . Cancer Neg Hx    . Early death Neg Hx   . Heart disease Neg Hx     Social History:  reports that she has never smoked. She has never used smokeless tobacco. She reports that she does not drink alcohol or use illicit drugs.  Allergies: No Known Allergies  Medications reviewed.     ROS A multisystem review of systems was completed. All pertinent positives and negatives were within the history of present illness remainder were negative.    BP 137/79 mmHg  Pulse 112  Temp(Src) 98.3 F (36.8 C) (Oral)  Ht 5\' 5"  (1.651 m)  Wt 134.718 kg (297 lb)  BMI 49.42 kg/m2  LMP 02/23/2015  Physical Exam Gen.: No acute distress Chest: Clear to patient regular rhythm Abdomen: Soft, nontender, nondistended however obese. Rectum. Large multiloculated condyloma extending from the dentate line. No palpable internal hernias on exam no visible sites of bleeding.   No results found for this or any previous visit (from the past 48 hour(s)). No results found.  Assessment/Plan: 1. Condyloma acuminatum of perianal region 29 year old female with impressive condyloma acuminata. Upon review of her chart and discussion with patient she has never been treated for condyloma. Had discussion with patient via translator about the possible medical management of condyloma. We'll provide her with this prescription for Condylox. Also discussed that  given the extent of her disease she may require surgical excision however this to be addressed at her next visit and determined by whether or not she responds to the topical treatment. Return to clinic in 4 weeks or sooner should her bleeding worsens. - podofilox (CONDYLOX) 0.5 % gel; Apply topically 2 (two) times daily.  Dispense: 3.5 g; Refill: 3   Mila Merry, MD [facility] General Surgeon [facility]

## 2015-03-02 NOTE — Patient Instructions (Signed)
Take prescribed medication as directed and return in 1 month for a in office follow up to check for resolution of anal growth.

## 2015-03-03 ENCOUNTER — Encounter: Payer: Self-pay | Admitting: *Deleted

## 2015-03-29 ENCOUNTER — Telehealth: Payer: Self-pay

## 2015-03-29 NOTE — Telephone Encounter (Signed)
Called patient and had to leave her a voicemail for her to return my call.  Patient needs to be rescheduled from April 05, 2015 to April 07, 2015.

## 2015-03-29 NOTE — Telephone Encounter (Signed)
Called patient and wasn't able to leave her a voicemail due to being full. This is the second call I made to try to reschedule patient's appointment.   I will cancel the interpreters service for 04/05/2015.

## 2015-03-31 ENCOUNTER — Telehealth: Payer: Self-pay

## 2015-03-31 NOTE — Telephone Encounter (Signed)
Called patient to let her know that her appointment has been changed to April 07, 2015 @ 2:00PM. Patient understood and asked me to mail her the appointment information to [address] [place], [place] [address].

## 2015-04-01 ENCOUNTER — Inpatient Hospital Stay (HOSPITAL_COMMUNITY)
Admission: EM | Admit: 2015-04-01 | Discharge: 2015-04-03 | DRG: 202 | Disposition: A | Discharge status: Home / Self Care | Payer: Medicaid Other | Attending: Internal Medicine | Admitting: Internal Medicine

## 2015-04-01 ENCOUNTER — Encounter (HOSPITAL_COMMUNITY): Payer: Self-pay | Admitting: Emergency Medicine

## 2015-04-01 ENCOUNTER — Emergency Department (HOSPITAL_COMMUNITY): Payer: Medicaid Other

## 2015-04-01 DIAGNOSIS — M130 Polyarthritis, unspecified: Secondary | ICD-10-CM | POA: Diagnosis present

## 2015-04-01 DIAGNOSIS — Z79899 Other long term (current) drug therapy: Secondary | ICD-10-CM

## 2015-04-01 DIAGNOSIS — J9601 Acute respiratory failure with hypoxia: Secondary | ICD-10-CM | POA: Diagnosis present

## 2015-04-01 DIAGNOSIS — Z9981 Dependence on supplemental oxygen: Secondary | ICD-10-CM | POA: Diagnosis not present

## 2015-04-01 DIAGNOSIS — J45901 Unspecified asthma with (acute) exacerbation: Secondary | ICD-10-CM | POA: Diagnosis present

## 2015-04-01 DIAGNOSIS — Z7901 Long term (current) use of anticoagulants: Secondary | ICD-10-CM | POA: Diagnosis not present

## 2015-04-01 DIAGNOSIS — I10 Essential (primary) hypertension: Secondary | ICD-10-CM | POA: Diagnosis present

## 2015-04-01 DIAGNOSIS — G8929 Other chronic pain: Secondary | ICD-10-CM | POA: Diagnosis present

## 2015-04-01 DIAGNOSIS — M351 Other overlap syndromes: Secondary | ICD-10-CM | POA: Diagnosis present

## 2015-04-01 DIAGNOSIS — A630 Anogenital (venereal) warts: Secondary | ICD-10-CM | POA: Diagnosis present

## 2015-04-01 DIAGNOSIS — J849 Interstitial pulmonary disease, unspecified: Secondary | ICD-10-CM | POA: Diagnosis present

## 2015-04-01 DIAGNOSIS — E669 Obesity, unspecified: Secondary | ICD-10-CM | POA: Diagnosis present

## 2015-04-01 DIAGNOSIS — M329 Systemic lupus erythematosus, unspecified: Secondary | ICD-10-CM | POA: Diagnosis present

## 2015-04-01 DIAGNOSIS — Z86718 Personal history of other venous thrombosis and embolism: Secondary | ICD-10-CM

## 2015-04-01 DIAGNOSIS — F329 Major depressive disorder, single episode, unspecified: Secondary | ICD-10-CM | POA: Diagnosis present

## 2015-04-01 DIAGNOSIS — J8410 Pulmonary fibrosis, unspecified: Secondary | ICD-10-CM | POA: Diagnosis present

## 2015-04-01 DIAGNOSIS — M069 Rheumatoid arthritis, unspecified: Secondary | ICD-10-CM | POA: Diagnosis present

## 2015-04-01 DIAGNOSIS — J45991 Cough variant asthma: Secondary | ICD-10-CM

## 2015-04-01 DIAGNOSIS — Z6841 Body Mass Index (BMI) 40.0 and over, adult: Secondary | ICD-10-CM | POA: Diagnosis not present

## 2015-04-01 DIAGNOSIS — Z86711 Personal history of pulmonary embolism: Secondary | ICD-10-CM | POA: Diagnosis present

## 2015-04-01 DIAGNOSIS — G4730 Sleep apnea, unspecified: Secondary | ICD-10-CM | POA: Diagnosis present

## 2015-04-01 DIAGNOSIS — R05 Cough: Secondary | ICD-10-CM | POA: Diagnosis present

## 2015-04-01 DIAGNOSIS — R109 Unspecified abdominal pain: Secondary | ICD-10-CM

## 2015-04-01 DIAGNOSIS — IMO0002 Reserved for concepts with insufficient information to code with codable children: Secondary | ICD-10-CM

## 2015-04-01 LAB — CBC WITH DIFFERENTIAL/PLATELET
Basophils Absolute: 0 10*3/uL (ref 0.0–0.1)
Basophils Relative: 0 %
Eosinophils Absolute: 0 10*3/uL (ref 0.0–0.7)
Eosinophils Relative: 1 %
HCT: 39.5 % (ref 36.0–46.0)
Hemoglobin: 11.2 g/dL — ABNORMAL LOW (ref 12.0–15.0)
Lymphocytes Relative: 29 %
Lymphs Abs: 2 10*3/uL (ref 0.7–4.0)
MCH: 24.9 pg — ABNORMAL LOW (ref 26.0–34.0)
MCHC: 28.4 g/dL — ABNORMAL LOW (ref 30.0–36.0)
MCV: 87.8 fL (ref 78.0–100.0)
Monocytes Absolute: 0.3 10*3/uL (ref 0.1–1.0)
Monocytes Relative: 5 %
Neutro Abs: 4.4 10*3/uL (ref 1.7–7.7)
Neutrophils Relative %: 65 %
Platelets: 328 10*3/uL (ref 150–400)
RBC: 4.5 MIL/uL (ref 3.87–5.11)
RDW: 15.9 % — ABNORMAL HIGH (ref 11.5–15.5)
WBC: 6.8 10*3/uL (ref 4.0–10.5)

## 2015-04-01 LAB — BASIC METABOLIC PANEL
Anion gap: 10 (ref 5–15)
BUN: 9 mg/dL (ref 6–20)
CO2: 27 mmol/L (ref 22–32)
Calcium: 8.3 mg/dL — ABNORMAL LOW (ref 8.9–10.3)
Chloride: 102 mmol/L (ref 101–111)
Creatinine, Ser: 0.59 mg/dL (ref 0.44–1.00)
GFR calc Af Amer: >60 mL/min (ref >60)
GFR calc non Af Amer: >60 mL/min (ref >60)
Glucose, Bld: 119 mg/dL — ABNORMAL HIGH (ref 65–99)
Potassium: 3.7 mmol/L (ref 3.5–5.1)
Sodium: 139 mmol/L (ref 135–145)

## 2015-04-01 MED ORDER — IBUPROFEN 400 MG PO TABS
600.0000 mg | ORAL_TABLET | Freq: Once | ORAL | Status: AC
  Administered 2015-04-01: 600 mg via ORAL
  Filled 2015-04-01 (×2): qty 1

## 2015-04-01 MED ORDER — ALBUTEROL SULFATE (2.5 MG/3ML) 0.083% IN NEBU
INHALATION_SOLUTION | RESPIRATORY_TRACT | Status: AC
  Filled 2015-04-01: qty 3

## 2015-04-01 MED ORDER — PREDNISONE 50 MG PO TABS
50.0000 mg | ORAL_TABLET | Freq: Every day | ORAL | Status: DC
  Administered 2015-04-02 – 2015-04-03 (×2): 50 mg via ORAL
  Filled 2015-04-01 (×2): qty 1

## 2015-04-01 MED ORDER — SODIUM CHLORIDE 0.9 % IJ SOLN
3.0000 mL | Freq: Two times a day (BID) | INTRAMUSCULAR | Status: DC
  Administered 2015-04-02 (×3): 3 mL via INTRAVENOUS

## 2015-04-01 MED ORDER — IPRATROPIUM-ALBUTEROL 0.5-2.5 (3) MG/3ML IN SOLN: 3.0000 mL | Freq: Once | RESPIRATORY_TRACT | Status: DC

## 2015-04-01 MED ORDER — ALBUTEROL SULFATE (2.5 MG/3ML) 0.083% IN NEBU
5.0000 mg | INHALATION_SOLUTION | Freq: Once | RESPIRATORY_TRACT | Status: AC
  Administered 2015-04-01: 5 mg via RESPIRATORY_TRACT

## 2015-04-01 MED ORDER — AZATHIOPRINE 50 MG PO TABS
250.0000 mg | ORAL_TABLET | Freq: Every day | ORAL | Status: DC
  Administered 2015-04-02 – 2015-04-03 (×2): 250 mg via ORAL
  Filled 2015-04-01 (×2): qty 5

## 2015-04-01 MED ORDER — OXYCODONE-ACETAMINOPHEN 5-325 MG PO TABS
1.0000 | ORAL_TABLET | Freq: Four times a day (QID) | ORAL | Status: DC | PRN
  Administered 2015-04-02 – 2015-04-03 (×5): 2 via ORAL
  Filled 2015-04-01 (×5): qty 2

## 2015-04-01 MED ORDER — HYDROXYZINE HCL 10 MG PO TABS: 10.0000 mg | ORAL_TABLET | Freq: Three times a day (TID) | ORAL | Status: DC | PRN

## 2015-04-01 MED ORDER — ALBUTEROL (5 MG/ML) CONTINUOUS INHALATION SOLN
15.0000 mg/h | INHALATION_SOLUTION | RESPIRATORY_TRACT | Status: DC
  Administered 2015-04-01: 15 mg/h via RESPIRATORY_TRACT
  Filled 2015-04-01: qty 20

## 2015-04-01 MED ORDER — PREDNISONE 20 MG PO TABS
60.0000 mg | ORAL_TABLET | Freq: Once | ORAL | Status: AC
  Administered 2015-04-01: 60 mg via ORAL
  Filled 2015-04-01: qty 3

## 2015-04-01 MED ORDER — MOMETASONE FURO-FORMOTEROL FUM 100-5 MCG/ACT IN AERO
2.0000 | INHALATION_SPRAY | Freq: Two times a day (BID) | RESPIRATORY_TRACT | Status: DC
  Administered 2015-04-02 (×2): 2 via RESPIRATORY_TRACT
  Filled 2015-04-01 (×2): qty 8.8

## 2015-04-01 MED ORDER — RIVAROXABAN 20 MG PO TABS
20.0000 mg | ORAL_TABLET | Freq: Every day | ORAL | Status: DC
  Administered 2015-04-02: 20 mg via ORAL
  Filled 2015-04-01: qty 1

## 2015-04-01 MED ORDER — HYDROXYCHLOROQUINE SULFATE 200 MG PO TABS
200.0000 mg | ORAL_TABLET | Freq: Two times a day (BID) | ORAL | Status: DC
Start: 2015-04-01 — End: 2015-04-03
  Administered 2015-04-02 – 2015-04-03 (×4): 200 mg via ORAL
  Filled 2015-04-01 (×5): qty 1

## 2015-04-01 MED ORDER — ALBUTEROL SULFATE HFA 108 (90 BASE) MCG/ACT IN AERS: 2.0000 | INHALATION_SPRAY | RESPIRATORY_TRACT | Status: DC | PRN

## 2015-04-01 MED ORDER — OXYCODONE-ACETAMINOPHEN 5-325 MG PO TABS
2.0000 | ORAL_TABLET | Freq: Once | ORAL | Status: AC
  Administered 2015-04-01: 2 via ORAL
  Filled 2015-04-01: qty 2

## 2015-04-01 NOTE — H&P (Signed)
[facility] History and Physical  Donna Hall GGE:[id number] DOB: September 23, 1985 DOA: 04/01/2015  Referring physician: EDP PCP: Lora Paula, MD   Chief Complaint: Respiratory distress   HPI: Donna Hall is a 29 y.o. 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  Patient presents to the ED today with c/o non-productive cough, wheezing, and body aches for past 1 week.  No fever.  Appears that she hasnt been taking home meds as prescribed according to med rec, specifically as related to her respiratory status not taking inhaled steroids, prednisone, or albuterol according to the med rec.  Review of Systems: Systems reviewed.  As above, otherwise negative  Past Medical History  Diagnosis Date  . Psoriasis 2010    as a child  . Chronic pain disorder   . Obesity   . Chronic steroid use 2010  . Lupus (HCC) 2000  . Lupus (systemic lupus erythematosus) (HCC) 2010  . Rheumatoid arthritis(714.0) 2010  . Hypertension 2010  . Pneumonia "several times"  . Interstitial lung disease (HCC)     [name] 05/15/2014  . On home oxygen therapy     "2L; 24/7" (05/15/2014)  . Pulmonary embolism (HCC)     hx/notes 05/15/2014  . History of blood transfusion     "because white cells ate the red cells"  . Headache     "just when I have the pain from aching bones and psorasis and/or lupus symptoms" (05/15/2104)  . 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 Asthma   . Gestational diabetes 2006  . Depression   . Sleep apnea    Past Surgical History  Procedure Laterality Date  . Thigh / knee soft tissue biopsy Right 2011    thigh  . Cesarean section  2010   Social History:  reports that she has never smoked. She has never used smokeless tobacco. She reports that she does not drink alcohol or use illicit drugs.  No Known Allergies  Family History  Problem Relation  Age of Onset  . Diabetes Mother   . Hypertension Mother   . Cancer Neg Hx   . Early death Neg Hx   . Heart disease Neg Hx      Prior to Admission medications   Medication Sig Start Date End Date Taking? Authorizing Provider  azaTHIOprine (IMURAN) 50 MG tablet Take 5 tablets (250 mg total) by mouth daily. 02/05/15  Yes Tiffany Netta Cedars, PA-C  hydroxychloroquine (PLAQUENIL) 200 MG tablet Take 1 tablet (200 mg total) by mouth 2 (two) times daily. 10/26/14  Yes Adrian Blackwater Moding, MD  hydrOXYzine (ATARAX/VISTARIL) 10 MG tablet Take 1 tablet (10 mg total) by mouth every 8 (eight) hours as needed for itching. 02/05/15  Yes Tiffany Netta Cedars, PA-C  rivaroxaban (XARELTO) 20 MG TABS tablet Take 1 tablet (20 mg total) by mouth daily with supper. 04/02/14  Yes Josalyn Funches, MD  albuterol (PROVENTIL HFA;VENTOLIN HFA) 108 (90 BASE) MCG/ACT inhaler Inhale 2 puffs into the lungs every 4 (four) hours as needed for wheezing or shortness of breath. Patient not taking: Reported on 04/01/2015 08/05/14   Nyoka Cowden, MD  imiquimod Mathis Dad) 5 % cream Apply topically 3 (three) times a week. Apply to affected area 3 times per week Patient not taking: Reported on 04/01/2015 02/05/15   Vivianne Master, PA-C  mometasone-formoterol (DULERA) 100-5 MCG/ACT AERO Take 2 puffs first  thing in am and then another 2 puffs about 12 hours later. Patient not taking: Reported on 04/01/2015 11/04/14   Adrian Blackwater Moding, MD  podofilox (CONDYLOX) 0.5 % gel Apply topically 2 (two) times daily. Patient not taking: Reported on 04/01/2015 03/02/15   Ricarda Frame, MD  predniSONE (DELTASONE) 10 MG tablet Take 1 tablet (10 mg total) by mouth daily with breakfast. 40 mg daily for one week, then 20 mg daily for one week, then 10 mg daily. Patient not taking: Reported on 04/01/2015 12/25/14   Dessa Phi, MD   Physical Exam: Filed Vitals:   04/01/15 2315  BP: 118/51  Pulse: 120  Temp:   Resp: 27    BP 118/51 mmHg  Pulse 120  Temp(Src) 98.4 F  (36.9 C) (Oral)  Resp 27  SpO2 95%  LMP 03/18/2015  General Appearance:    Alert, oriented, no distress, appears stated age  Head:    Normocephalic, atraumatic  Eyes:    PERRL, EOMI, sclera non-icteric        Nose:   Nares without drainage or epistaxis. Mucosa, turbinates normal  Throat:   Moist mucous membranes. Oropharynx without erythema or exudate.  Neck:   Supple. No carotid bruits.  No thyromegaly.  No lymphadenopathy.   Back:     No CVA tenderness, no spinal tenderness  Lungs:     Diffuse wheezes  Chest wall:    No tenderness to palpitation  Heart:    Regular rate and rhythm without murmurs, gallops, rubs  Abdomen:     Soft, non-tender, nondistended, normal bowel sounds, no organomegaly  Genitalia:    deferred  Rectal:    deferred  Extremities:   No clubbing, cyanosis or edema.  Pulses:   2+ and symmetric all extremities  Skin:   Skin color, texture, turgor normal, no rashes or lesions  Lymph nodes:   Cervical, supraclavicular, and axillary nodes normal  Neurologic:   CNII-XII intact. Normal strength, sensation and reflexes      throughout    Labs on Admission:  Basic Metabolic Panel:  Recent Labs Lab 04/01/15 2229  NA 139  K 3.7  CL 102  CO2 27  GLUCOSE 119*  BUN 9  CREATININE 0.59  CALCIUM 8.3*   Liver Function Tests: No results for input(s): AST, ALT, ALKPHOS, BILITOT, PROT, ALBUMIN in the last 168 hours. No results for input(s): LIPASE, AMYLASE in the last 168 hours. No results for input(s): AMMONIA in the last 168 hours. CBC:  Recent Labs Lab 04/01/15 2229  WBC 6.8  NEUTROABS 4.4  HGB 11.2*  HCT 39.5  MCV 87.8  PLT 328   Cardiac Enzymes: No results for input(s): CKTOTAL, CKMB, CKMBINDEX, TROPONINI in the last 168 hours.  BNP (last 3 results)  Recent Labs  05/15/14 1021  PROBNP 26.7   CBG: No results for input(s): GLUCAP in the last 168 hours.  Radiological Exams on Admission: Dg Chest 2 View  04/01/2015   CLINICAL DATA:   Nonproductive cough  EXAM: CHEST  2 VIEW  COMPARISON:  01/28/2015  FINDINGS: Lungs are under aerated. Stable interstitial lung disease. Upper normal heart size. No pneumothorax. No pleural effusion.  IMPRESSION: Stable low lung volumes with interstitial lung disease.   Electronically Signed   By: Jolaine Click M.D.   On: 04/01/2015 21:00    EKG: Independently reviewed.  Assessment/Plan Principal Problem:   Acute respiratory failure with hypoxia (HCC) Active Problems:   INTERSTITIAL LUNG DISEASE   Systemic lupus erythematosus (HCC)  Cough variant asthma vs UACS   Arthritis or polyarthritis, rheumatoid (HCC)   Connective tissue disease overlap syndrome (HCC)   1. Acute respiratory failure with hypoxia - 1. Resume home neb treatments that have been effective in past 2. Adult wheeze protocol 3. Prednisone ordered as this has been effective in the past in controlling patient's symptoms (see also Dr. Thurston Hole office note from Feb this year). 4. Tele monitor for tachycardia 2. 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    Code Status: Full Code  Family Communication: No family in room Disposition Plan: Admit to inpatient   Time spent: 70 min  Kaydie Petsch M. [facility] Pager [phone]  If 7AM-7PM, please contact the day team taking care of the patient [email or web address] Password [email or web address] 04/01/2015, 11:46 PM

## 2015-04-01 NOTE — ED Provider Notes (Signed)
CSN: [id number]     Arrival date & time 04/01/15  1842 History   First MD Initiated Contact with Patient 04/01/15 1953     Chief Complaint  Patient presents with  . Cough  . Wheezing     (Consider location/radiation/quality/duration/timing/severity/associated sxs/prior Treatment) HPI   29 y.o. 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 Patient presents to the ED today with c/o non-productive cough, wheezing, and body aches for past 1 week. No fever.Has not been compliant with medications.   Past Medical History  Diagnosis Date  . Psoriasis 2010    as a child  . Chronic pain disorder   . Obesity   . Chronic steroid use 2010  . Lupus (HCC) 2000  . Lupus (systemic lupus erythematosus) (HCC) 2010  . Rheumatoid arthritis(714.0) 2010  . Hypertension 2010  . Pneumonia "several times"  . Interstitial lung disease (HCC)     [name] 05/15/2014  . On home oxygen therapy     "2L; 24/7" (05/15/2014)  . Pulmonary embolism (HCC)     hx/notes 05/15/2014  . History of blood transfusion     "because white cells ate the red cells"  . Headache     "just when I have the pain from aching bones and psorasis and/or lupus symptoms" (05/15/2104)  . 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 Asthma   . Gestational diabetes 2006  . Depression   . Sleep apnea    Past Surgical History  Procedure Laterality Date  . Thigh / knee soft tissue biopsy Right 2011    thigh  . Cesarean section  2010   Family History  Problem Relation Age of Onset  . Diabetes Mother   . Hypertension Mother   . Cancer Neg Hx   . Early death Neg Hx   . Heart disease Neg Hx    Social History  Substance Use Topics  . Smoking status: Never Smoker   . Smokeless tobacco: Never Used  . Alcohol Use: No   OB History    No data available     Review of Systems  All systems reviewed and negative, other than  as noted in HPI.   Allergies  Review of patient's allergies indicates no known allergies.  Home Medications   Prior to Admission medications   Medication Sig Start Date End Date Taking? Authorizing Provider  azaTHIOprine (IMURAN) 50 MG tablet Take 5 tablets (250 mg total) by mouth daily. 02/05/15  Yes Tiffany Netta Cedars, PA-C  hydroxychloroquine (PLAQUENIL) 200 MG tablet Take 1 tablet (200 mg total) by mouth 2 (two) times daily. 10/26/14  Yes Adrian Blackwater Moding, MD  hydrOXYzine (ATARAX/VISTARIL) 10 MG tablet Take 1 tablet (10 mg total) by mouth every 8 (eight) hours as needed for itching. 02/05/15  Yes Tiffany Netta Cedars, PA-C  rivaroxaban (XARELTO) 20 MG TABS tablet Take 1 tablet (20 mg total) by mouth daily with supper. 04/02/14  Yes Josalyn Funches, MD  acetaminophen-codeine (TYLENOL #3) 300-30 MG per tablet Take 1 tablet by mouth every 8 (eight) hours as needed for moderate pain. Patient not taking: Reported on 04/01/2015 12/31/14   Dessa Phi, MD  albuterol (PROVENTIL HFA;VENTOLIN HFA) 108 (90 BASE) MCG/ACT inhaler Inhale 2 puffs into the lungs every 4 (four) hours as needed for wheezing or shortness of breath. Patient not taking: Reported on 04/01/2015 08/05/14   Nyoka Cowden, MD  amitriptyline (ELAVIL) 10 MG tablet Take 1 tablet (10 mg total) by mouth at bedtime. Patient not taking: Reported on 04/01/2015 11/04/14   Adrian Blackwater Moding, MD  diphenhydrAMINE (BENADRYL) 2 % cream Apply topically 3 (three) times daily as needed for itching. Patient not taking: Reported on 04/01/2015 09/18/14   Dessa Phi, MD  hydrocortisone (ANUSOL-HC) 25 MG suppository Place 1 suppository (25 mg total) rectally 2 (two) times daily. For 7 days Patient not taking: Reported on 04/01/2015 01/28/15   Fayrene Helper, PA-C  imiquimod Mathis Dad) 5 % cream Apply topically 3 (three) times a week. Apply to affected area 3 times per week Patient not taking: Reported on 04/01/2015 02/05/15   Vivianne Master, PA-C  levofloxacin (LEVAQUIN) 500  MG tablet Take 1 tablet (500 mg total) by mouth daily. Patient not taking: Reported on 04/01/2015 11/19/14   Fayrene Helper, PA-C  mometasone-formoterol (DULERA) 100-5 MCG/ACT AERO Take 2 puffs first thing in am and then another 2 puffs about 12 hours later. Patient not taking: Reported on 04/01/2015 11/04/14   Adrian Blackwater Moding, MD  oxyCODONE-acetaminophen (PERCOCET) 10-325 MG per tablet Take 1 tablet by mouth every 4 (four) hours as needed for pain. Patient not taking: Reported on 04/01/2015 11/19/14   Fayrene Helper, PA-C  podofilox (CONDYLOX) 0.5 % gel Apply topically 2 (two) times daily. Patient not taking: Reported on 04/01/2015 03/02/15   Ricarda Frame, MD  predniSONE (DELTASONE) 10 MG tablet Take 1 tablet (10 mg total) by mouth daily with breakfast. 40 mg daily for one week, then 20 mg daily for one week, then 10 mg daily. Patient not taking: Reported on 04/01/2015 12/25/14   Josalyn Funches, MD   BP 118/51 mmHg  Pulse 120  Temp(Src) 98.4 F (36.9 C) (Oral)  Resp 27  SpO2 95%  LMP 03/18/2015 Physical Exam  Constitutional: She appears well-developed and well-nourished. No distress.  HENT:  Head: Normocephalic and atraumatic.  Eyes: Conjunctivae are normal. Right eye exhibits no discharge. Left eye exhibits no discharge.  Neck: Neck supple.  Cardiovascular: Normal rate, regular rhythm and normal heart sounds.  Exam reveals no gallop and no friction rub.   No murmur heard. Pulmonary/Chest: No respiratory distress. She has wheezes.  Tachypnea. Bilateral expiratory wheezing. Prolonged expiratory phase.  Abdominal: Soft. She exhibits no distension. There is no tenderness.  Musculoskeletal: She exhibits no edema or tenderness.  Lower extremities symmetric as compared to each other. No calf tenderness. Negative Homan's. No palpable cords.   Neurological: She is alert.  Skin: Skin is warm and dry.  Psychiatric: She has a normal mood and affect. Her behavior is normal. Thought content normal.  Nursing  note and vitals reviewed.   ED Course  Procedures (including critical care time)  CRITICAL CARE Performed by: Raeford Razor Total critical care time: 35 minutes Critical care time was exclusive of separately billable procedures and treating other patients. Critical care was necessary to treat or prevent imminent or life-threatening deterioration. Critical care was time spent personally by me on the following activities: development of treatment plan with patient and/or surrogate as well as nursing, discussions with consultants, evaluation of patient's response to treatment, examination of patient, obtaining history from patient or surrogate, ordering and performing treatments and interventions, ordering and review of laboratory studies, ordering and review of radiographic studies, pulse oximetry and re-evaluation of patient's condition.  Labs Review Labs Reviewed  CBC WITH DIFFERENTIAL/PLATELET - Abnormal; Notable for the following:    Hemoglobin 11.2 (*)    MCH 24.9 (*)  MCHC 28.4 (*)    RDW 15.9 (*)    All other components within normal limits  BASIC METABOLIC PANEL - Abnormal; Notable for the following:    Glucose, Bld 119 (*)    Calcium 8.3 (*)    All other components within normal limits    Imaging Review Dg Chest 2 View  04/01/2015   CLINICAL DATA:  Nonproductive cough  EXAM: CHEST  2 VIEW  COMPARISON:  01/28/2015  FINDINGS: Lungs are under aerated. Stable interstitial lung disease. Upper normal heart size. No pneumothorax. No pleural effusion.  IMPRESSION: Stable low lung volumes with interstitial lung disease.   Electronically Signed   By: Jolaine Click M.D.   On: 04/01/2015 21:00   I have personally reviewed and evaluated these images and lab results as part of my medical decision-making.   EKG Interpretation None      MDM   Final diagnoses:  Asthma exacerbation    29 year old female with shortness of breath. We can exam. History of asthma. Clinically has been  exacerbation. She was treated with steroids and continuous albuterol treatment. She remains with increased work of breathing and I do not feel that she is appropriate for discharge at this time. She is afebrile. Chest x-ray does not show anything acute. We will discuss with medicine permission.    Raeford Razor, MD 04/08/15 1040

## 2015-04-01 NOTE — ED Notes (Signed)
C/o non-productive cough, generalized body aches, and wheezing x 1 week.  Denies fever.

## 2015-04-01 NOTE — ED Notes (Signed)
No answer in waiting area.

## 2015-04-02 ENCOUNTER — Encounter (HOSPITAL_COMMUNITY): Payer: Self-pay | Admitting: Radiology

## 2015-04-02 ENCOUNTER — Inpatient Hospital Stay (HOSPITAL_COMMUNITY): Payer: Medicaid Other

## 2015-04-02 DIAGNOSIS — J45901 Unspecified asthma with (acute) exacerbation: Secondary | ICD-10-CM | POA: Diagnosis present

## 2015-04-02 DIAGNOSIS — M358 Other specified systemic involvement of connective tissue: Secondary | ICD-10-CM

## 2015-04-02 DIAGNOSIS — J9601 Acute respiratory failure with hypoxia: Secondary | ICD-10-CM

## 2015-04-02 DIAGNOSIS — R1084 Generalized abdominal pain: Secondary | ICD-10-CM

## 2015-04-02 DIAGNOSIS — Z86711 Personal history of pulmonary embolism: Secondary | ICD-10-CM | POA: Diagnosis present

## 2015-04-02 LAB — HEPATIC FUNCTION PANEL
ALBUMIN: 3.3 g/dL — AB (ref 3.5–5.0)
ALT: 12 U/L — ABNORMAL LOW (ref 14–54)
AST: 41 U/L (ref 15–41)
Alkaline Phosphatase: 52 U/L (ref 38–126)
Bilirubin, Direct: 0.4 mg/dL (ref 0.1–0.5)
Indirect Bilirubin: 0.3 mg/dL (ref 0.3–0.9)
TOTAL PROTEIN: 6.6 g/dL (ref 6.5–8.1)
Total Bilirubin: 0.7 mg/dL (ref 0.3–1.2)

## 2015-04-02 LAB — URINALYSIS, ROUTINE W REFLEX MICROSCOPIC
Bilirubin Urine: NEGATIVE
Glucose, UA: NEGATIVE mg/dL
HGB URINE DIPSTICK: NEGATIVE
KETONES UR: NEGATIVE mg/dL
LEUKOCYTES UA: NEGATIVE
Nitrite: NEGATIVE
PROTEIN: NEGATIVE mg/dL
Specific Gravity, Urine: 1.017 (ref 1.005–1.030)
UROBILINOGEN UA: 1 mg/dL (ref 0.0–1.0)
pH: 7 (ref 5.0–8.0)

## 2015-04-02 LAB — BASIC METABOLIC PANEL
ANION GAP: 14 (ref 5–15)
BUN: 8 mg/dL (ref 6–20)
CHLORIDE: 98 mmol/L — AB (ref 101–111)
CO2: 26 mmol/L (ref 22–32)
Calcium: 8.2 mg/dL — ABNORMAL LOW (ref 8.9–10.3)
Creatinine, Ser: 0.65 mg/dL (ref 0.44–1.00)
GFR calc non Af Amer: >60 mL/min (ref >60)
Glucose, Bld: 178 mg/dL — ABNORMAL HIGH (ref 65–99)
POTASSIUM: 4 mmol/L (ref 3.5–5.1)
SODIUM: 138 mmol/L (ref 135–145)

## 2015-04-02 LAB — CBC
HCT: 38 % (ref 36.0–46.0)
HEMOGLOBIN: 10.9 g/dL — AB (ref 12.0–15.0)
MCH: 25.7 pg — AB (ref 26.0–34.0)
MCHC: 28.7 g/dL — ABNORMAL LOW (ref 30.0–36.0)
MCV: 89.6 fL (ref 78.0–100.0)
Platelets: 295 10*3/uL (ref 150–400)
RBC: 4.24 MIL/uL (ref 3.87–5.11)
RDW: 16.3 % — ABNORMAL HIGH (ref 11.5–15.5)
WBC: 7.9 10*3/uL (ref 4.0–10.5)

## 2015-04-02 LAB — LIPASE, BLOOD: LIPASE: 33 U/L (ref 22–51)

## 2015-04-02 LAB — PREGNANCY, URINE: PREG TEST UR: NEGATIVE

## 2015-04-02 MED ORDER — IOHEXOL 300 MG/ML  SOLN
25.0000 mL | INTRAMUSCULAR | Status: AC
  Administered 2015-04-02 (×2): 25 mL via ORAL

## 2015-04-02 MED ORDER — GLUCERNA SHAKE PO LIQD: 237.0000 mL | ORAL | Status: DC

## 2015-04-02 MED ORDER — HYDROCOD POLST-CPM POLST ER 10-8 MG/5ML PO SUER
5.0000 mL | Freq: Once | ORAL | Status: AC
  Administered 2015-04-02: 5 mL via ORAL
  Filled 2015-04-02: qty 5

## 2015-04-02 MED ORDER — ENSURE ENLIVE PO LIQD
237.0000 mL | Freq: Two times a day (BID) | ORAL | Status: DC
Start: 2015-04-02 — End: 2015-04-02

## 2015-04-02 MED ORDER — ALBUTEROL SULFATE (2.5 MG/3ML) 0.083% IN NEBU
3.0000 mL | INHALATION_SOLUTION | RESPIRATORY_TRACT | Status: DC | PRN
  Administered 2015-04-02: 3 mL via RESPIRATORY_TRACT
  Filled 2015-04-02: qty 3

## 2015-04-02 MED ORDER — IOHEXOL 300 MG/ML  SOLN
100.0000 mL | Freq: Once | INTRAMUSCULAR | Status: AC | PRN
  Administered 2015-04-02: 100 mL via INTRAVENOUS

## 2015-04-02 MED ORDER — BOOST / RESOURCE BREEZE PO LIQD
1.0000 | ORAL | Status: DC
  Administered 2015-04-03: 1 via ORAL

## 2015-04-02 MED ORDER — ONDANSETRON HCL 4 MG/2ML IJ SOLN: 4.0000 mg | Freq: Three times a day (TID) | INTRAMUSCULAR | Status: AC | PRN

## 2015-04-02 MED ORDER — ALBUTEROL SULFATE (2.5 MG/3ML) 0.083% IN NEBU: 2.5000 mg | INHALATION_SOLUTION | RESPIRATORY_TRACT | Status: DC | PRN

## 2015-04-02 NOTE — Progress Notes (Signed)
Initial Nutrition Assessment  DOCUMENTATION CODES:   Morbid obesity  INTERVENTION:  Provide Glucerna Shake once daily, provides 220 kcal and 10 grams of protein Provide Boost Breeze once daily, provides 250 kcal and 9 grams of protein   NUTRITION DIAGNOSIS:   Predicted suboptimal nutrient intake related to altered GI function as evidenced by per patient/family report, meal completion < 25%.   GOAL:   Patient will meet greater than or equal to 90% of their needs   MONITOR:   PO intake, Supplement acceptance, Weight trends, Labs  REASON FOR ASSESSMENT:   Malnutrition Screening Tool    ASSESSMENT:   29 y.o. 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 Patient presents to the ED today with c/o non-productive cough, wheezing, and body aches for past 1 week.  Pt states that her appetite is very good but, she has had abdominal pain and therefore has not been wanting to eat the past few days. She reports feeling better after having a bowel movement. She states that she did not eat any breakfast. She has lost a few pounds but, nothing significant. She is agreeable to receiving nutritional supplements to sip on until she is able to eat.   Labs: low hemoglobin, elevated glucose, low calcium  Diet Order:  Diet regular Room service appropriate?: Yes; Fluid consistency:: Thin  Skin:  Reviewed, no issues  Last BM:  10/6  Height:   Ht Readings from Last 1 Encounters:  04/02/15 5\' 5"  (1.651 m)    Weight:   Wt Readings from Last 1 Encounters:  04/02/15 294 lb 4.8 oz (133.494 kg)    Ideal Body Weight:  56.8 kg  BMI:  Body mass index is 48.97 kg/(m^2).  Estimated Nutritional Needs:   Kcal:  2000-2300  Protein:  80-90 grams  Fluid:  2.3 L/day  EDUCATION NEEDS:   No education needs identified at this time  06/02/15 RD, LDN Inpatient Clinical Dietitian Pager: [phone] After Hours Pager: [phone]

## 2015-04-02 NOTE — ED Notes (Signed)
Attempted report 

## 2015-04-02 NOTE — Progress Notes (Signed)
[facility] PROGRESS NOTE  Donna Hall VOJ:[id number] DOB: 15-Dec-1985 DOA: 04/01/2015  PCP: Lora Paula, MD  Brief HPI: 29 year old female of Hispanic origin with past medical history of mixed connective tissue disorder overlap (rheumatoid arthritis/lupus), history of stage I lung disease, asthma versus upper airway disease, presented with cough, wheezing, body aches. She was thought to have some element of asthma exacerbation and was hospitalized for further management. Subsequently, developed lower abdominal pain and dysuria. She also was recently diagnosed with condyloma acuminatum. Plan is for surgical intervention next week.  Past medical history:  Past Medical History  Diagnosis Date  . Psoriasis 2010    as a child  . Chronic pain disorder   . Obesity   . Chronic steroid use 2010  . Lupus (HCC) 2000  . Lupus (systemic lupus erythematosus) (HCC) 2010  . Rheumatoid arthritis(714.0) 2010  . Hypertension 2010  . Pneumonia "several times"  . Interstitial lung disease (HCC)     [name] 05/15/2014  . On home oxygen therapy     "2L; 24/7" (05/15/2014)  . Pulmonary embolism (HCC)     hx/notes 05/15/2014  . History of blood transfusion     "because white cells ate the red cells"  . Headache     "just when I have the pain from aching bones and psorasis and/or lupus symptoms" (05/15/2104)  . 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 Asthma   . Gestational diabetes 2006  . Depression   . Sleep apnea     Consultants: None  Procedures: None  Antibiotics: None  Subjective: Interpreter services were utilized for this conversation. Patient complains of pain with urination and pain in the abdomen. No nausea, vomiting or diarrhea. She appears to be in considerable discomfort. Breathing is improved. Cough is better. Patient denies any blood in the urine.  Objective: Vital Signs  Filed Vitals:   04/02/15 0120 04/02/15 0432 04/02/15 0943 04/02/15 1218  BP: 126/64 128/69 100/78   Pulse: 108 104 86   Temp: 98.5 F (36.9 C) 98.7 F (37.1 C) 98.4 F (36.9 C)   TempSrc: Oral Oral    Resp: 22 20    Height: 5\' 5"  (1.651 m)     Weight: 133.494 kg (294 lb 4.8 oz)     SpO2: 98% 100% 100% 100%    Intake/Output Summary (Last 24 hours) at 04/02/15 1259 Last data filed at 04/02/15 1000  Gross per 24 hour  Intake    105 ml  Output      0 ml  Net    105 ml   Filed Weights   04/02/15 0120  Weight: 133.494 kg (294 lb 4.8 oz)    General appearance: alert, cooperative, appears stated age, no distress and morbidly obese Resp: Scattered end expiratory wheezing bilaterally. Few crackles at the bases. Cardio: regular rate and rhythm, S1, S2 normal, no murmur, click, rub or gallop GI: Obese. Soft. Tender diffusely without any rebound, rigidity or guarding. No masses or organomegaly. Extremities: extremities normal, atraumatic, no cyanosis or edema Neurologic: Alert and oriented 3. No focal neurological deficits.  Lab Results:  Basic Metabolic Panel:  Recent Labs Lab 04/01/15 2229 04/02/15 0336  NA 139 138  K 3.7 4.0  CL 102 98*  CO2 27 26  GLUCOSE 119* 178*  BUN 9 8  CREATININE 0.59 0.65  CALCIUM 8.3* 8.2*   Liver Function Tests:  Recent Labs Lab 04/02/15  1030  AST 41  ALT 12*  ALKPHOS 52  BILITOT 0.7  PROT 6.6  ALBUMIN 3.3*    Recent Labs Lab 04/02/15 1030  LIPASE 33   CBC:  Recent Labs Lab 04/01/15 2229 04/02/15 0336  WBC 6.8 7.9  NEUTROABS 4.4  --   HGB 11.2* 10.9*  HCT 39.5 38.0  MCV 87.8 89.6  PLT 328 295   BNP (last 3 results)  Recent Labs  06/20/14 2116 09/11/14 1407 11/19/14 1349  BNP 20.6 12.0 8.7    No results found for this or any previous visit (from the past 240 hour(s)).    Studies/Results: Dg Chest 2 View  04/01/2015   CLINICAL DATA:  Nonproductive cough  EXAM: CHEST  2 VIEW  COMPARISON:  01/28/2015  FINDINGS: Lungs are  under aerated. Stable interstitial lung disease. Upper normal heart size. No pneumothorax. No pleural effusion.  IMPRESSION: Stable low lung volumes with interstitial lung disease.   Electronically Signed   By: Jolaine Click M.D.   On: 04/01/2015 21:00    Medications:  Scheduled: . azaTHIOprine  250 mg Oral Daily  . feeding supplement  1 Container Oral Q24H  . feeding supplement (GLUCERNA SHAKE)  237 mL Oral Q24H  . hydroxychloroquine  200 mg Oral BID  . mometasone-formoterol  2 puff Inhalation BID  . predniSONE  50 mg Oral Q breakfast  . rivaroxaban  20 mg Oral Q supper  . sodium chloride  3 mL Intravenous Q12H   Continuous: . albuterol Stopped (04/01/15 2245)   ESP:[id number], hydrOXYzine, ondansetron (ZOFRAN) IV, oxyCODONE-acetaminophen  Assessment/Plan:  Principal Problem:   Acute respiratory failure with hypoxia (HCC) Active Problems:   INTERSTITIAL LUNG DISEASE   Systemic lupus erythematosus (HCC)   Cough variant asthma vs UACS   Arthritis or polyarthritis, rheumatoid (HCC)   Connective tissue disease overlap syndrome (HCC)   Asthma exacerbation    Acute respiratory failure with hypoxia/asthma exacerbation Patient was noted to be wheezing profusely at the time of admission. This appears to have improved. Patient feels better. She was placed on steroids, which will be continued. Nebulizer treatments as needed. Oxygen as needed.  Abdominal pain with dysuria Apparently she did not have this symptom when she was hospitalized. We will check a UA. She is quite tender to palpation on physical examination. Proceed with CT scan of the abdomen, pelvis. Check LFTs and lipase.  History of mixed connective tissue disorder overlap syndrome/interstitial lung disease Stable. Continue her immunosuppressants.  History of venous thromboembolism Continue oral anticoagulation.  DVT Prophylaxis: On oral anticoagulation  Code Status: Full code  Family Communication: Discussed with the  patient  Disposition Plan: Await improvement     LOS: 1 day   [facility]  [facility] Pager [phone] 04/02/2015, 12:59 PM  If 7PM-7AM, please contact night-coverage at [email or web address], password [facility]

## 2015-04-02 NOTE — Hospital Discharge Follow-Up (Signed)
Transitional Care Clinic Care Coordination Note:  Admit date:  04/01/15 Discharge date: TBD Discharge Disposition: ? Home when stable Patient contact: [phone] (mobile) Emergency contact(s): [phone] (husband-Manuel)  This Case Manager reviewed patient's EMR and determined patient would benefit from post-discharge medical management and chronic care management services through the [facility]. 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 Patient admitted for acute respiratory failure with hypoxia. Patient has had 3 inpatient admissions and 3 ED visits in the last 6 months. This Case Manager met with patient to discuss the services and medical management that can be provided at the [facility]. Patient verbalized understanding and agreed to receive post-discharge care at the [facility]. [employer] telephonic interpreting used for [employer] [place] [employer] [phone]).  Patient scheduled for Transitional Care appointment on 04/09/15 at 1000 with Dr. Jarold Song.  Clinic information and appointment time provided to patient. Appointment information also placed on AVS.  Assessment:       Home Environment: Patient lives with her spouse, Audelia Hives, and children in a private residence       Support System: spouse       Level of functioning: independent        Home DME: Patient indicated she uses continuous O2 at 2L (oxygen supplier is [employer]).       Home care services: none       Transportation: private vehicle        Food/Nutrition: Patient indicated her family receives food stamps. She also indicated she has access to needed food.        Medications: Patient indicated she uses pharmacy at [facility] because she no longer has Medicaid. Thoroughly discussed available pharmacy resources at [facility]  and informed patient of pharmacy hours. Patient appreciative of information.        Identified Barriers: language (Spanish is preferred language). Patient uninsured. She indicated she had Medicaid in the past and is now uninsured. Informed patient that [place] has Designer, fashion/clothing. Patient aware she will need to schedule an appointment to meet with Financial Counselor after discharge.        PCP: Dr. Adrian [facility])              Arranged services:        Services communicated to Olga Coaster, RN CM

## 2015-04-02 NOTE — Plan of Care (Signed)
Problem: Phase I Progression Outcomes Goal: O2 sats > or equal 90% or at baseline Outcome: Completed/Met Date Met:  04/02/15 Good O2 Sats- on room air

## 2015-04-03 LAB — COMPREHENSIVE METABOLIC PANEL
ALT: 9 U/L — ABNORMAL LOW (ref 14–54)
ANION GAP: 6 (ref 5–15)
AST: 14 U/L — ABNORMAL LOW (ref 15–41)
Albumin: 3.1 g/dL — ABNORMAL LOW (ref 3.5–5.0)
Alkaline Phosphatase: 48 U/L (ref 38–126)
BILIRUBIN TOTAL: 0.6 mg/dL (ref 0.3–1.2)
BUN: 8 mg/dL (ref 6–20)
CO2: 33 mmol/L — ABNORMAL HIGH (ref 22–32)
Calcium: 8.4 mg/dL — ABNORMAL LOW (ref 8.9–10.3)
Chloride: 102 mmol/L (ref 101–111)
Creatinine, Ser: 0.43 mg/dL — ABNORMAL LOW (ref 0.44–1.00)
Glucose, Bld: 87 mg/dL (ref 65–99)
POTASSIUM: 4 mmol/L (ref 3.5–5.1)
Sodium: 141 mmol/L (ref 135–145)
TOTAL PROTEIN: 5.9 g/dL — AB (ref 6.5–8.1)

## 2015-04-03 LAB — CBC
HEMATOCRIT: 37.6 % (ref 36.0–46.0)
Hemoglobin: 10.7 g/dL — ABNORMAL LOW (ref 12.0–15.0)
MCH: 25.7 pg — ABNORMAL LOW (ref 26.0–34.0)
MCHC: 28.5 g/dL — AB (ref 30.0–36.0)
MCV: 90.2 fL (ref 78.0–100.0)
Platelets: 309 10*3/uL (ref 150–400)
RBC: 4.17 MIL/uL (ref 3.87–5.11)
RDW: 16.3 % — AB (ref 11.5–15.5)
WBC: 7.8 10*3/uL (ref 4.0–10.5)

## 2015-04-03 MED ORDER — PREDNISONE 20 MG PO TABS: ORAL_TABLET | ORAL | Status: DC

## 2015-04-03 MED ORDER — MOMETASONE FURO-FORMOTEROL FUM 100-5 MCG/ACT IN AERO: INHALATION_SPRAY | RESPIRATORY_TRACT | Status: DC

## 2015-04-03 MED ORDER — ALBUTEROL SULFATE HFA 108 (90 BASE) MCG/ACT IN AERS: 2.0000 | INHALATION_SPRAY | RESPIRATORY_TRACT | Status: DC | PRN

## 2015-04-03 MED ORDER — OXYCODONE-ACETAMINOPHEN 5-325 MG PO TABS: 1.0000 | ORAL_TABLET | Freq: Four times a day (QID) | ORAL | Status: DC | PRN

## 2015-04-03 NOTE — Discharge Instructions (Signed)
Bronchospasm, Adult A bronchospasm is when the tubes that carry air in and out of your lungs (airways) spasm or tighten. During a bronchospasm it is hard to breathe. This is because the airways get smaller. A bronchospasm can be triggered by:  Allergies. These may be to animals, pollen, food, or mold.  Infection. This is a common cause of bronchospasm.  Exercise.  Irritants. These include pollution, cigarette smoke, strong odors, aerosol sprays, and paint fumes.  Weather changes.  Stress.  Being emotional. HOME CARE   Always have a plan for getting help. Know when to call your doctor and local emergency services (911 in the [place] Know where you can get emergency care.  Only take medicines as told by your doctor.  If you were prescribed an inhaler or nebulizer machine, ask your doctor how to use it correctly. Always use a spacer with your inhaler if you were given one.  Stay calm during an attack. Try to relax and breathe more slowly.  Control your home environment:  Change your heating and air conditioning filter at least once a month.  Limit your use of fireplaces and wood stoves.  Do not  smoke. Do not  allow smoking in your home.  Avoid perfumes and fragrances.  Get rid of pests (such as roaches and mice) and their droppings.  Throw away plants if you see mold on them.  Keep your house clean and dust free.  Replace carpet with wood, tile, or vinyl flooring. Carpet can trap dander and dust.  Use allergy-proof pillows, mattress covers, and box spring covers.  Wash bed sheets and blankets every week in hot water. Dry them in a dryer.  Use blankets that are made of polyester or cotton.  Wash hands frequently. GET HELP IF:  You have muscle aches.  You have chest pain.  The thick spit you spit or cough up (sputum) changes from clear or white to yellow, green, gray, or bloody.  The thick spit you spit or cough up gets thicker.  There are problems that may be  related to the medicine you are given such as:  A rash.  Itching.  Swelling.  Trouble breathing. GET HELP RIGHT AWAY IF:  You feel you cannot breathe or catch your breath.  You cannot stop coughing.  Your treatment is not helping you breathe better.  You have very bad chest pain. MAKE SURE YOU:   Understand these instructions.  Will watch your condition.  Will get help right away if you are not doing well or get worse.   This information is not intended to replace advice given to you by your health care provider. Make sure you discuss any questions you have with your health care provider.   Document Released: 04/09/2009 Document Revised: 07/03/2014 Document Reviewed: 12/03/2012 Elsevier Interactive Patient Education 2016 Elsevier Inc.  

## 2015-04-03 NOTE — Discharge Summary (Signed)
[facility]  Physician Discharge Summary   Patient ID: Donna Hall MRN: [id number] DOB/AGE: 1985/11/03 29 y.o.  Admit date: 04/01/2015 Discharge date: 04/03/2015  PCP: Lora Paula, MD  DISCHARGE DIAGNOSES:  Principal Problem:   Acute respiratory failure with hypoxia (HCC) Active Problems:   INTERSTITIAL LUNG DISEASE   Systemic lupus erythematosus (HCC)   Cough variant asthma vs UACS   Chronic anticoagulation   Arthritis or polyarthritis, rheumatoid (HCC)   Connective tissue disease overlap syndrome (HCC)   Asthma exacerbation   History of pulmonary embolism   RECOMMENDATIONS FOR OUTPATIENT FOLLOW UP: 1. Patient has an upcoming procedure for her large condyloma acuminatum on Wednesday, October 13. 2. She has been on is to discontinue her oral anticoagulation 48 hours prior to her surgical intervention   DISCHARGE CONDITION: fair  Diet recommendation: Heart healthy  Filed Weights   04/02/15 0120 04/03/15 0524  Weight: 133.494 kg (294 lb 4.8 oz) 133.584 kg (294 lb 8 oz)    INITIAL HISTORY: 29 year old female of Hispanic origin with past medical history of mixed connective tissue disorder overlap (rheumatoid arthritis/lupus), history of stage I lung disease, asthma versus upper airway disease, presented with cough, wheezing, body aches. She was thought to have some element of asthma exacerbation and was hospitalized for further management. Subsequently, developed lower abdominal pain and dysuria. She also was recently diagnosed with condyloma acuminatum. Plan is for surgical intervention next week.   HOSPITAL COURSE:   Acute respiratory failure with hypoxia/asthma exacerbation Patient was noted to be wheezing profusely at the time of admission. With steroids. Patient has significantly improved. Does not have any wheezing this morning. Cough is better. Chest x-ray did not show any infiltrates. This was most likely asthma exacerbation. Apparently  uses oxygen at home, which can be continued. She will be discharged with the steroids taper.   Abdominal pain with dysuria Patient's symptoms have improved this morning. UA did not suggest infection. CT scan of the abdomen, pelvis did not show any acute findings. The lithiasis was noted, however, her LFTs are normal. Pain was not in the right upper quadrant. Lipase was normal as well. As mentioned, symptoms have resolved. She has tolerated her diet.  History of mixed connective tissue disorder overlap syndrome/interstitial lung disease Stable. Continue her immunosuppressants.  History of venous thromboembolism Continue oral anticoagulation.  Large Perineal condyloma acuminatum Plan is for surgical intervention. This coming Wednesday. She has been asked to stop taking her Xarelto 48 hours prior to surgery. Pain Medications prescribed.  Overall, stable. Ambulating without any difficulties. Feels much better. Okay for discharge home today.    PERTINENT LABS:  The results of significant diagnostics from this hospitalization (including imaging, microbiology, ancillary and laboratory) are listed below for reference.     Labs: Basic Metabolic Panel:  Recent Labs Lab 04/01/15 2229 04/02/15 0336 04/03/15 0413  NA 139 138 141  K 3.7 4.0 4.0  CL 102 98* 102  CO2 27 26 33*  GLUCOSE 119* 178* 87  BUN 9 8 8   CREATININE 0.59 0.65 0.43*  CALCIUM 8.3* 8.2* 8.4*   Liver Function Tests:  Recent Labs Lab 04/02/15 1030 04/03/15 0413  AST 41 14*  ALT 12* 9*  ALKPHOS 52 48  BILITOT 0.7 0.6  PROT 6.6 5.9*  ALBUMIN 3.3* 3.1*    Recent Labs Lab 04/02/15 1030  LIPASE 33   CBC:  Recent Labs Lab 04/01/15 2229 04/02/15 0336 04/03/15 0413  WBC 6.8 7.9 7.8  NEUTROABS 4.4  --   --  HGB 11.2* 10.9* 10.7*  HCT 39.5 38.0 37.6  MCV 87.8 89.6 90.2  PLT 328 295 309    IMAGING STUDIES Dg Chest 2 View  04/01/2015   CLINICAL DATA:  Nonproductive cough  EXAM: CHEST  2 VIEW   COMPARISON:  01/28/2015  FINDINGS: Lungs are under aerated. Stable interstitial lung disease. Upper normal heart size. No pneumothorax. No pleural effusion.  IMPRESSION: Stable low lung volumes with interstitial lung disease.   Electronically Signed   By: Jolaine Click M.D.   On: 04/01/2015 21:00   Ct Abdomen Pelvis W Contrast  04/02/2015   CLINICAL DATA:  Initial encounter for lower abdominal pain and nausea for 5 days.  EXAM: CT ABDOMEN AND PELVIS WITH CONTRAST  TECHNIQUE: Multidetector CT imaging of the abdomen and pelvis was performed using the standard protocol following bolus administration of intravenous contrast.  CONTRAST:  OMNIPAQUE IOHEXOL 300 MG/ML  SOLN  COMPARISON:  06/07/2014.  FINDINGS: Lower chest: Dependent atelectasis noted in the lower lungs bilaterally.  Hepatobiliary: No focal abnormality within the liver parenchyma. Multiple gallstones are evident, measuring in the 10-15 mm size range. No intrahepatic or extrahepatic biliary dilation.  Pancreas: No focal mass lesion. No dilatation of the main duct. No intraparenchymal cyst. No peripancreatic edema.  Spleen: No splenomegaly. No focal mass lesion.  Adrenals/Urinary Tract: No adrenal nodule or mass. No abnormality is seen in either kidney. No evidence for hydroureter. The urinary bladder appears normal for the degree of distention.  Stomach/Bowel: Stomach is nondistended. No gastric wall thickening. No evidence of outlet obstruction. Duodenum is normally positioned as is the ligament of Treitz. No small bowel wall thickening. No small bowel dilatation. The terminal ileum is normal. The appendix is normal. No gross colonic mass. No colonic wall thickening. No substantial diverticular change.  Vascular/Lymphatic: No abdominal aortic aneurysm There is no gastrohepatic or hepatoduodenal ligament lymphadenopathy. No intraperitoneal or retroperitoneal lymphadenopy. No pelvic sidewall lymphadenopathy.  Reproductive: The uterus has normal CT  imaging appearance. There is no adnexal mass.  Other: There is some trace free fluid in the pelvis, mainly in the region of the right adnexal space.  Musculoskeletal: Bone windows reveal no worrisome lytic or sclerotic osseous lesions.  IMPRESSION: 1. No acute findings in the abdomen or pelvis. 2. Cholelithiasis 3. There is a trace amount of free fluid in the adnexal regions, right greater than left. This finding can be physiologic in a premenopausal female.   Electronically Signed   By: Kennith Center M.D.   On: 04/02/2015 16:48    DISCHARGE EXAMINATION: Filed Vitals:   04/02/15 1218 04/02/15 2037 04/02/15 2122 04/03/15 0524  BP:  102/48  98/50  Pulse:  93  88  Temp:  98.4 F (36.9 C)  98.3 F (36.8 C)  TempSrc:  Oral  Oral  Resp:  20  20  Height:      Weight:    133.584 kg (294 lb 8 oz)  SpO2: 100% 98% 98% 98%   General appearance: alert, cooperative, appears stated age, no distress and morbidly obese Resp: Improved air entry bilaterally. Coarse breath sounds. No wheezing. Cardio: regular rate and rhythm, S1, S2 normal, no murmur, click, rub or gallop GI: soft, non-tender; bowel sounds normal; no masses,  no organomegaly Extremities: extremities normal, atraumatic, no cyanosis or edema  DISPOSITION: Home  Discharge Instructions    Call MD for:  difficulty breathing, headache or visual disturbances    Complete by:  As directed      Call  MD for:  extreme fatigue    Complete by:  As directed      Call MD for:  persistant dizziness or light-headedness    Complete by:  As directed      Call MD for:  persistant nausea and vomiting    Complete by:  As directed      Call MD for:  temperature >100.4    Complete by:  As directed      Diet - low sodium heart healthy    Complete by:  As directed      Discharge instructions    Complete by:  As directed   Please stop taking the blood thinner (Xarelto/Rivaroxaban) 2 days before your surgery. See your PCP in 1 week.  You were cared for by a  hospitalist during your hospital stay. If you have any questions about your discharge medications or the care you received while you were in the hospital after you are discharged, you can call the unit and asked to speak with the hospitalist on call if the hospitalist that took care of you is not available. Once you are discharged, your primary care physician will handle any further medical issues. Please note that NO REFILLS for any discharge medications will be authorized once you are discharged, as it is imperative that you return to your primary care physician (or establish a relationship with a primary care physician if you do not have one) for your aftercare needs so that they can reassess your need for medications and monitor your lab values. If you do not have a primary care physician, you can call [phone] for a physician referral.     Increase activity slowly    Complete by:  As directed            ALLERGIES: No Known Allergies   Current Discharge Medication List    START taking these medications   Details  oxyCODONE-acetaminophen (PERCOCET/ROXICET) 5-325 MG tablet Take 1-2 tablets by mouth every 6 (six) hours as needed for moderate pain or severe pain. Qty: 30 tablet, Refills: 0      CONTINUE these medications which have CHANGED   Details  albuterol (PROVENTIL HFA;VENTOLIN HFA) 108 (90 BASE) MCG/ACT inhaler Inhale 2 puffs into the lungs every 4 (four) hours as needed for wheezing or shortness of breath. Qty: 1 Inhaler, Refills: 3    mometasone-formoterol (DULERA) 100-5 MCG/ACT AERO Take 2 puffs first thing in am and then another 2 puffs about 12 hours later. Qty: 1 Inhaler, Refills: 2    predniSONE (DELTASONE) 20 MG tablet Take 2 tablets once daily for 5 days, then take 1 tablet once daily for 5 days, then STOP. Qty: 15 tablet, Refills: 0   Associated Diagnoses: Lupus (HCC)      CONTINUE these medications which have NOT CHANGED   Details  azaTHIOprine (IMURAN) 50 MG  tablet Take 5 tablets (250 mg total) by mouth daily. Qty: 150 tablet, Refills: 3   Associated Diagnoses: Lupus (HCC)    hydroxychloroquine (PLAQUENIL) 200 MG tablet Take 1 tablet (200 mg total) by mouth 2 (two) times daily. Qty: 60 tablet, Refills: 3   Associated Diagnoses: Lupus (HCC)    hydrOXYzine (ATARAX/VISTARIL) 10 MG tablet Take 1 tablet (10 mg total) by mouth every 8 (eight) hours as needed for itching. Qty: 90 tablet, Refills: 5   Associated Diagnoses: Psoriasis    rivaroxaban (XARELTO) 20 MG TABS tablet Take 1 tablet (20 mg total) by mouth daily with supper. Qty: 30 tablet,  Refills: 7   Associated Diagnoses: Pulmonary embolism (HCC)      STOP taking these medications     imiquimod (ALDARA) 5 % cream      podofilox (CONDYLOX) 0.5 % gel        Follow-up Information    Follow up with [facility]     On 04/09/2015.   Why:  Transitional Care Clinic appointment on 04/09/15 at 10:00 am with Dr. Venetia Night.   Contact information:   [address] [place] [address] [phone]      TOTAL DISCHARGE TIME: 35 mins  [facility]  [facility] Pager [phone]  04/03/2015, 1:36 PM

## 2015-04-03 NOTE — Care Management Note (Signed)
Case Management Note  Patient Details  Name: Donna Hall MRN: [id number] Date of Birth: 1985/08/29  Subjective/Objective:                  Respiratory distress  Action/Plan: CM used interpretive services  @[phone] and spoke with Donna Hall [phone]. Cm at the bedside and spoke to pt. Pt states that she goes to [facility] for follow up but will not be able to get her medications filled until Monday. CM provided MATCH letter and used interpretive services to explain MATCH program to the pt and provided with list of [facility] pharmacies along with letter. Pt states that she has enough support at home and has no further needs. Pt already gets home oxygen with [facility]. CM called and left VM for Tiffany with [facility]. CM asked about portable Oxygen tank and pt says that she is not sure if hers works. CM encouraged pt to call [facility] to advise of need for evaluation. CM called and spoke to [facility] with [facility] DME to request portable tank to the bedside and requested for them to do an eval for oxygen. No further needs communicated at this time.   Expected Discharge Date:  04/03/15               Expected Discharge Plan:  Home/Self Care  In-House Referral:     Discharge planning Services  CM Consult  Post Acute Care Choice:  Durable Medical Equipment Choice offered to:  Patient  DME Arranged:  Oxygen DME Agency:  [facility]  HH Arranged:    [facility] Agency:     Status of Service:  Completed, signed off  Medicare Important Message Given:    Date Medicare IM Given:    Medicare IM give by:    Date Additional Medicare IM Given:    Additional Medicare Important Message give by:     If discussed at Long Length of Stay Meetings, dates discussed:    Additional Comments:  [facility], RN 04/03/2015, 1:10 PM

## 2015-04-05 ENCOUNTER — Telehealth: Payer: Self-pay

## 2015-04-05 ENCOUNTER — Ambulatory Visit: Payer: Self-pay | Admitting: General Surgery

## 2015-04-05 NOTE — Telephone Encounter (Signed)
Transitional Care Clinic Post-discharge Follow-Up Phone Call:  Date of Discharge:04/03/15 Principal Discharge Diagnosis(es):  Acute respiratory failure, hypoxia Post-discharge Communication: Call placed to the patient # [phone] and the voice mailbox was full, unable to leave a message. Call then placed to the patient's designated emergency contact  Kelby Fam # [phone]. He stated that he was at work and he would have his wife call the office at # [phone]. He noted that he would give her the message when he got home today.  Call Completed: No                   Interpreter Needed: Yes               Language/Dialect: Spanish- Clara # [phone] with [employer] used for the call to the patient and then to her husband.

## 2015-04-06 ENCOUNTER — Telehealth: Payer: Self-pay

## 2015-04-06 NOTE — Telephone Encounter (Signed)
Transitional Care Clinic Post-discharge Follow-Up Phone Call:  Date of Discharge:04/03/2015 Principal Discharge Diagnosis(es): acute respiratory failure with hypoxia, mixed connective tissue disorder  - lupus/RA, h/o venous thromboembolism, condyloma acuminatum  Post-discharge Communication: Spoke to the patient Call Completed: Yes                    With Whom: Patient Interpreter Needed: Yes               Language/Dialect: Spanish  Interpreter - [place] # [phone] with [employer].      Please check all that apply:  X Patient is knowledgeable of his/her condition(s) and/or treatment. ? Patient is caring for self at home.  ? Patient is receiving assist at home from family and/or caregiver. Family and/or caregiver is knowledgeable of patient's condition(s) and/or treatment. ? Patient is receiving home health services. If so, name of agency.     Medication Reconciliation:  ? Medication list reviewed with patient.  X  Patient obtained all discharge medications. If not, why?  The patient stated that she has all of her medications and she is taking them as prescribed. . She reported that she was at church when this CM called her and she did not have the medications with her.  She said that she stopped taking the xarelto yesterday because she was instructed to stop it 48 hours before her surgical appointment which is scheduled for tomorrow - 04/07/15.  She was very pre-occupied with the date and time of the surgery and when asked a question via the interpreter would ask if her surgical appointment is tomorrow?  After review of her upcoming appointments scheduled in Epic, this CM informed her that she has an appointment scheduled for 04/07/15 @ 1400 with Dr Tonita Cong and it is documented in the Epic notes on 03/31/15 that she received a call from General surgery informing her that her appointment is 04/07/15 @ 1400.  She was appreciative of the information. She stated that she had no questions  about her medications. CM instructed her to bring all of her medications to her appointment at [facility] on 04/09/15 and she verbalized understanding.    Activities of Daily Living:  X Independent - has O2 from [facility]. She noted that she has a large oxygen machine at home and a small green tank to use when she goes out.  She said that she had a question about the small tank but was not able to explain her question to the interpreter. CM encouraged her to contact [facility] and provided her with the # [phone]. She repeated the number back in spanish and stated she would call the company. ? Needs assist (describe; ? home DME used) ? [facility] (describe, ? home DME used)   Community resources in place for patient:  ? None  X  Home Health/Home DME - [facility] for O2 ? Assisted Living ? Support Group          Patient Education:  She asked why she needed to come to the clinic and this CM explained the reason for medical follow up after her hospitalization. She said that she is feeling " a  lot better " since coming home from the hospital. She again was focused on her surgical appointment scheduled for tomorrow.         Questions/Concerns discussed:  She confirmed that she will be at her appointment at [facility] on 04/09/15 @ 1000 and said that she has transportation.  She stated that she had no other questions or concerns and noted that she was appreciative of the call to check on her.

## 2015-04-07 ENCOUNTER — Encounter: Payer: Self-pay | Admitting: *Deleted

## 2015-04-07 ENCOUNTER — Other Ambulatory Visit: Payer: Self-pay | Admitting: *Deleted

## 2015-04-07 ENCOUNTER — Ambulatory Visit (INDEPENDENT_AMBULATORY_CARE_PROVIDER_SITE_OTHER): Payer: Self-pay | Admitting: General Surgery

## 2015-04-07 ENCOUNTER — Telehealth: Payer: Self-pay | Admitting: General Surgery

## 2015-04-07 ENCOUNTER — Telehealth: Payer: Self-pay

## 2015-04-07 ENCOUNTER — Encounter: Payer: Self-pay | Admitting: General Surgery

## 2015-04-07 VITALS — BP 134/76 | HR 96 | Temp 98.4°F | Ht 65.0 in | Wt 294.0 lb

## 2015-04-07 DIAGNOSIS — A630 Anogenital (venereal) warts: Secondary | ICD-10-CM

## 2015-04-07 MED ORDER — HYDROCODONE-ACETAMINOPHEN 5-325 MG PO TABS: 1.0000 | ORAL_TABLET | Freq: Four times a day (QID) | ORAL | Status: DC | PRN

## 2015-04-07 MED ORDER — LIDOCAINE 5 % EX OINT: 1.0000 "application " | TOPICAL_OINTMENT | CUTANEOUS | Status: DC | PRN

## 2015-04-07 NOTE — Telephone Encounter (Signed)
Referral is complete--appointment date/time: 05/05/15 @ 10:00am with Dr Davina Poke colo-rectal-[address] [place] [address] [facility] [address]. Patient has been informed.

## 2015-04-07 NOTE — Addendum Note (Signed)
Addended by: Ricarda Frame T on: 04/07/2015 02:57 PM   Modules accepted: Orders

## 2015-04-07 NOTE — Patient Instructions (Signed)
You will be receiving a call from [facility] to set up an appointment for you to see a specialist. If you have any questions, please call our office at your earliest convenience.

## 2015-04-07 NOTE — Progress Notes (Signed)
Outpatient Surgical Follow Up  04/07/2015  Ioanna Colquhoun Chilel is an 29 y.o. female.   Chief Complaint  Patient presents with  . Follow-up    HPI: 29 year old female with multiple medical problems presents for follow-up of perianal condyloma. Patient reports that the areas Exie worsened since she was last seen. She does not recall how often or how much she used to Condylox that was previously prescribed. She reports that the condyloma now appears to have progressed into her vagina from her rectum. She also states she's had some leakage from her anus requiring chronic use of the pad. She was recently hospitalized for an unassociated respiratory problem. She denies any current fevers, chills, nausea, vomiting.  Past Medical History  Diagnosis Date  . Psoriasis 2010    as a child  . Chronic pain disorder   . Obesity   . Chronic steroid use 2010  . Lupus (HCC) 2000  . Lupus (systemic lupus erythematosus) (HCC) 2010  . Rheumatoid arthritis(714.0) 2010  . Hypertension 2010  . Pneumonia "several times"  . Interstitial lung disease (HCC)     [name] 05/15/2014  . On home oxygen therapy     "2L; 24/7" (05/15/2014)  . Pulmonary embolism (HCC)     hx/notes 05/15/2014  . History of blood transfusion     "because white cells ate the red cells"  . Headache     "just when I have the pain from aching bones and psorasis and/or lupus symptoms" (05/15/2104)  . 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 Asthma   . Gestational diabetes 2006  . Depression   . Sleep apnea     Past Surgical History  Procedure Laterality Date  . Thigh / knee soft tissue biopsy Right 2011    thigh  . Cesarean section  2010    Family History  Problem Relation Age of Onset  . Diabetes Mother   . Hypertension Mother   . Cancer Neg Hx   . Early death Neg Hx   . Heart disease Neg Hx     Social History:  reports that she has never smoked. She has  never used smokeless tobacco. She reports that she does not drink alcohol or use illicit drugs.  Allergies: No Known Allergies  Medications reviewed.    ROS A multipoint review of systems was completed. All pertinent positives negatives within the history of present illness.   BP 134/76 mmHg  Pulse 96  Temp(Src) 98.4 F (36.9 C) (Oral)  Ht 5\' 5"  (1.651 m)  Wt 133.358 kg (294 lb)  BMI 48.92 kg/m2  LMP 03/18/2015  Physical Exam  Gen.: No acute distress Chest: Clear to auscultation Heart: Regular rate and rhythm Abdomen: Soft, nontender, nondistended excellent rectum: Large fungating mass from the rectum wrapping into the vagina. This is consistent with condyloma acuminata. No active bleeding. Palpable tone on rectal exam but with mucous present on removal. Condyloma extends in all directions from the rectum.   No results found for this or any previous visit (from the past 48 hour(s)). No results found.  Assessment/Plan:  1. Condyloma acuminatum of perianal region 29 year old female with large condyloma. This has failed to have any response to Condylox. Due to the large nature of this as well as involvement of the vagina and associated incontinence we will refer to tertiary care center for further treatment. Discussed this with the patient. All conversation with the patient through an  interpreter provided at the time of visit.     Ricarda Frame  04/07/2015,negative

## 2015-04-07 NOTE — Telephone Encounter (Signed)
This Case Manager placed call to patient to follow-up because patient was questioning Robyne Peers, RN CM about her upcoming surgery appointment.  Updated patient that she has an appointment scheduled with [facility] today at 1400. Patient verbalized understanding and indicated she had already arrived for her appointment. Reminded patient of her initial [facility] appointment on 04/09/15 at 1000, and patient verbalized understanding. She confirmed she would be at appointment and had transportation to her upcoming appointment. No additional needs/concerns identified. [employer] telephonic interpreting used for language translation (Interpretor-Bianca [phone]).

## 2015-04-08 ENCOUNTER — Telehealth: Payer: Self-pay

## 2015-04-08 NOTE — Telephone Encounter (Signed)
Called patient to make an attempt to give her referral information and her cell phone is not available. It seems that it has been disconnected. I will try to call her again tomorrow. I will also mail her the referral information. Hopefully patient will be able to obtain this information.  I also called the cell phone number listed on her chart and it goes straight to voicemail. I left a message to let her call me back.

## 2015-04-08 NOTE — Telephone Encounter (Signed)
Called patient to give her appointment to [facility] and she told me that she was illiterate. She tried putting somebody on the phone but the person didn't want to talk to me. I then asked patient to call me back once she had somebody to write down the [facility] appointment fort her. Patient understood.  [facility] [address] [place], [place] [address] Dr. Allayne Gitelman  May 05, 2015 10:00 AM

## 2015-04-09 ENCOUNTER — Encounter: Payer: Self-pay | Admitting: Family Medicine

## 2015-04-09 ENCOUNTER — Ambulatory Visit: Payer: Self-pay | Attending: Family Medicine | Admitting: Family Medicine

## 2015-04-09 VITALS — HR 101 | Temp 98.2°F | Resp 18 | Ht 62.0 in | Wt 291.0 lb

## 2015-04-09 DIAGNOSIS — Z6841 Body Mass Index (BMI) 40.0 and over, adult: Secondary | ICD-10-CM | POA: Insufficient documentation

## 2015-04-09 DIAGNOSIS — R109 Unspecified abdominal pain: Secondary | ICD-10-CM | POA: Insufficient documentation

## 2015-04-09 DIAGNOSIS — J8410 Pulmonary fibrosis, unspecified: Secondary | ICD-10-CM

## 2015-04-09 DIAGNOSIS — E669 Obesity, unspecified: Secondary | ICD-10-CM | POA: Insufficient documentation

## 2015-04-09 DIAGNOSIS — Z79899 Other long term (current) drug therapy: Secondary | ICD-10-CM | POA: Insufficient documentation

## 2015-04-09 DIAGNOSIS — Z8249 Family history of ischemic heart disease and other diseases of the circulatory system: Secondary | ICD-10-CM | POA: Insufficient documentation

## 2015-04-09 DIAGNOSIS — I10 Essential (primary) hypertension: Secondary | ICD-10-CM | POA: Insufficient documentation

## 2015-04-09 DIAGNOSIS — M069 Rheumatoid arthritis, unspecified: Secondary | ICD-10-CM

## 2015-04-09 DIAGNOSIS — J45991 Cough variant asthma: Secondary | ICD-10-CM

## 2015-04-09 DIAGNOSIS — Z833 Family history of diabetes mellitus: Secondary | ICD-10-CM | POA: Insufficient documentation

## 2015-04-09 DIAGNOSIS — J45909 Unspecified asthma, uncomplicated: Secondary | ICD-10-CM | POA: Insufficient documentation

## 2015-04-09 DIAGNOSIS — M329 Systemic lupus erythematosus, unspecified: Secondary | ICD-10-CM

## 2015-04-09 DIAGNOSIS — Z86711 Personal history of pulmonary embolism: Secondary | ICD-10-CM

## 2015-04-09 DIAGNOSIS — A630 Anogenital (venereal) warts: Secondary | ICD-10-CM

## 2015-04-09 DIAGNOSIS — Z7902 Long term (current) use of antithrombotics/antiplatelets: Secondary | ICD-10-CM | POA: Insufficient documentation

## 2015-04-09 DIAGNOSIS — K8020 Calculus of gallbladder without cholecystitis without obstruction: Secondary | ICD-10-CM | POA: Insufficient documentation

## 2015-04-09 DIAGNOSIS — J849 Interstitial pulmonary disease, unspecified: Secondary | ICD-10-CM | POA: Insufficient documentation

## 2015-04-09 MED ORDER — CONDYLOX 0.5 % EX GEL: Freq: Two times a day (BID) | CUTANEOUS | Status: DC

## 2015-04-09 MED ORDER — TRAMADOL HCL 50 MG PO TABS: 50.0000 mg | ORAL_TABLET | Freq: Three times a day (TID) | ORAL | Status: DC | PRN

## 2015-04-09 NOTE — Progress Notes (Signed)
TRANSITIONAL CARE CLINIC  Date of telephone encounter: 04/06/15  Admit date: 04/01/[birth year] Discharge date: 04/03/15  PCP: Dr Armen Pickup  CC: Follow up from Hospitalization  HPI: Donna Hall is a 28 y.o. female with a history of pulmonary embolism, rheumatoid arthritis, connective tissue disorder, interstitial lung disease who comes in to establish care at the transitional care clinic.  She presented to [facility] ED with cough, wheezing, body aches. She was thought to have some element of asthma exacerbation and was hospitalized for further management. She was placed on steroids and chest x-ray was negative for infiltrate and this was thought to be secondary to asthma exacerbation.  Subsequently, developed lower abdominal pain and dysuria. UA did not suggest infection. CT scan of the abdomen, pelvis did not show any acute findings. Cholelithiasis was noted, however, her LFTs were normal. Pain was not in the right upper quadrant. Lipase was normal as well.  She was recently diagnosed with condyloma acuminatum and recommendation was to follow up outpatient for surgical intervention. Her condition improved and she was subsequently discharged on a prednisone taper.  Interval history: She was seen by general surgery a few days ago and was informed she would be referred to [facility] for definitive surgical management of her condyloma acuminatum. Today she complains of persisting myalgias and lower abdominal pain. She admits to headaches and occasional sinus pain and he is yet to pick up prescriptions which she had received at discharge including her prednisone. Denies nausea, vomiting, menorrhagia. Review of her chart indicates she was seen by [facility] in 07/2014 for interstitial lung disease but she has not been back to see them and is unaware that she should be followed by pulmonologist.       No Known Allergies Past Medical History  Diagnosis Date  . Psoriasis 2010    as  a child  . Chronic pain disorder   . Obesity   . Chronic steroid use 2010  . Lupus (HCC) 2000  . Lupus (systemic lupus erythematosus) (HCC) 2010  . Rheumatoid arthritis(714.0) 2010  . Hypertension 2010  . Pneumonia "several times"  . Interstitial lung disease (HCC)     [name] 05/15/2014  . On home oxygen therapy     "2L; 24/7" (05/15/2014)  . Pulmonary embolism (HCC)     hx/notes 05/15/2014  . History of blood transfusion     "because white cells ate the red cells"  . Headache     "just when I have the pain from aching bones and psorasis and/or lupus symptoms" (05/15/2104)  . 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 Asthma   . Gestational diabetes 2006  . Depression   . Sleep apnea    Current Outpatient Prescriptions on File Prior to Visit  Medication Sig Dispense Refill  . acetaminophen-codeine (TYLENOL #3) 300-30 MG tablet   2  . albuterol (PROVENTIL HFA;VENTOLIN HFA) 108 (90 BASE) MCG/ACT inhaler Inhale 2 puffs into the lungs every 4 (four) hours as needed for wheezing or shortness of breath. 1 Inhaler 3  . CONDYLOX 0.5 % gel   3  . hydroxychloroquine (PLAQUENIL) 200 MG tablet Take 1 tablet (200 mg total) by mouth 2 (two) times daily. 60 tablet 3  . hydrOXYzine (ATARAX/VISTARIL) 10 MG tablet Take 1 tablet (10 mg total) by mouth every 8 (eight) hours as needed for itching. 90 tablet 5  . imiquimod (ALDARA) 5 % cream  1  . lidocaine (XYLOCAINE) 5 % ointment Apply 1 application topically as needed. 35.44 g 0  . mometasone-formoterol (DULERA) 100-5 MCG/ACT AERO Take 2 puffs first thing in am and then another 2 puffs about 12 hours later. 1 Inhaler 2  . oxyCODONE-acetaminophen (PERCOCET/ROXICET) 5-325 MG tablet Take 1-2 tablets by mouth every 6 (six) hours as needed for moderate pain or severe pain. 30 tablet 0  . predniSONE (DELTASONE) 20 MG tablet Take 2 tablets once daily for 5 days, then take 1 tablet once daily for  5 days, then STOP. 15 tablet 0  . rivaroxaban (XARELTO) 20 MG TABS tablet Take 1 tablet (20 mg total) by mouth daily with supper. 30 tablet 7  . azaTHIOprine (IMURAN) 50 MG tablet Take 5 tablets (250 mg total) by mouth daily. (Patient not taking: Reported on 04/09/2015) 150 tablet 3  . HYDROcodone-acetaminophen (NORCO) 5-325 MG tablet Take 1 tablet by mouth every 6 (six) hours as needed for moderate pain. 30 tablet 0   No current facility-administered medications on file prior to visit.   Family History  Problem Relation Age of Onset  . Diabetes Mother   . Hypertension Mother   . Cancer Neg Hx   . Early death Neg Hx   . Heart disease Neg Hx    Social History   Social History  . Marital Status: Single    Spouse Name: N/A  . Number of Children: 3  . Years of Education: 1   Occupational History  . Unemployed     Social History Main Topics  . Smoking status: Never Smoker   . Smokeless tobacco: Never Used  . Alcohol Use: No  . Drug Use: No  . Sexual Activity: Not Currently    Birth Control/ Protection: Injection   Other Topics Concern  . Not on file   Social History Narrative   Patient lives with husband and 3 children (78 in [place], 56 and 4) in [place]. She migrated form [place] in 2005. She does not work. Has completed the first grade. Cannot read and write.     Review of Systems: Constitutional: Negative for fever, chills, diaphoresis, activity change, appetite change and fatigue, positive for myalgias HENT: Negative for ear pain, nosebleeds, congestion, facial swelling, rhinorrhea, neck pain, neck stiffness and ear discharge, positive for sinus pain Eyes: Negative for pain, discharge, redness, itching and visual disturbance. Respiratory: Negative for cough, choking, chest tightness, shortness of breath, wheezing and stridor.  Cardiovascular: Negative for chest pain, palpitations and leg swelling. Gastrointestinal: Negative for abdominal distention, positive for  abdominal pain Genitourinary: Negative for dysuria, urgency, frequency, hematuria, flank pain, decreased urine volume, difficulty urinating and dyspareunia.  Musculoskeletal: Negative for back pain, joint swelling, arthralgias and gait problem. Neurological: Negative for dizziness, tremors, seizures, syncope, facial asymmetry, speech difficulty, weakness, light-headedness, numbness and positive headaches.  Hematological: Negative for adenopathy. Does not bruise/bleed easily. Psychiatric/Behavioral: Negative for hallucinations, behavioral problems, confusion, dysphoric mood, decreased concentration and agitation.    Objective:   Filed Vitals:   04/09/15 1016  Pulse: 101  Temp: 98.2 F (36.8 C)  Resp: 18    Physical Exam: Constitutional: Patient appears well-developed and well-nourished. No distress. HENT: Normocephalic, atraumatic, External right and left ear normal. Oropharynx is clear and moist.  Eyes: Conjunctivae and EOM are normal. PERRLA, no scleral icterus. Neck: Normal ROM. Neck supple. No JVD. No tracheal deviation. No thyromegaly. CVS: RRR, S1/S2 +, no murmurs, no gallops, no carotid bruit.  Pulmonary: Effort and breath sounds normal,  no stridor, rhonchi, wheezes, rales.  Abdominal: Soft. BS +, mid low abdominal pain Musculoskeletal: Normal range of motion. No edema and no tenderness.  Lymphadenopathy: No lymphadenopathy noted, cervical, inguinal or axillary Neuro: Alert. Normal reflexes, muscle tone coordination. No cranial nerve deficit. Skin: Skin is warm and dry. No rash noted. Not diaphoretic. No erythema. No pallor. Psychiatric: Normal mood and affect. Behavior, judgment, thought content normal.  Lab Results  Component Value Date   WBC 7.8 04/03/2015   HGB 10.7* 04/03/2015   HCT 37.6 04/03/2015   MCV 90.2 04/03/2015   PLT 309 04/03/2015   Lab Results  Component Value Date   CREATININE 0.43* 04/03/2015   BUN 8 04/03/2015   NA 141 04/03/2015   K 4.0  04/03/2015   CL 102 04/03/2015   CO2 33* 04/03/2015    Lab Results  Component Value Date   HGBA1C 5.5 02/23/2014   Lipid Panel     Component Value Date/Time   CHOL 123 03/08/2012 0552   TRIG 71 03/08/2012 0552   HDL 45 03/08/2012 0552   CHOLHDL 2.7 03/08/2012 0552   VLDL 14 03/08/2012 0552   LDLCALC 64 03/08/2012 0552   CLINICAL DATA: Initial encounter for lower abdominal pain and nausea for 5 days.  EXAM: CT ABDOMEN AND PELVIS WITH CONTRAST  TECHNIQUE: Multidetector CT imaging of the abdomen and pelvis was performed using the standard protocol following bolus administration of intravenous contrast.  CONTRAST: OMNIPAQUE IOHEXOL 300 MG/ML SOLN  COMPARISON: 06/07/2014.  FINDINGS: Lower chest: Dependent atelectasis noted in the lower lungs bilaterally.  Hepatobiliary: No focal abnormality within the liver parenchyma. Multiple gallstones are evident, measuring in the 10-15 mm size range. No intrahepatic or extrahepatic biliary dilation.  Pancreas: No focal mass lesion. No dilatation of the main duct. No intraparenchymal cyst. No peripancreatic edema.  Spleen: No splenomegaly. No focal mass lesion.  Adrenals/Urinary Tract: No adrenal nodule or mass. No abnormality is seen in either kidney. No evidence for hydroureter. The urinary bladder appears normal for the degree of distention.  Stomach/Bowel: Stomach is nondistended. No gastric wall thickening. No evidence of outlet obstruction. Duodenum is normally positioned as is the ligament of Treitz. No small bowel wall thickening. No small bowel dilatation. The terminal ileum is normal. The appendix is normal. No gross colonic mass. No colonic wall thickening. No substantial diverticular change.  Vascular/Lymphatic: No abdominal aortic aneurysm There is no gastrohepatic or hepatoduodenal ligament lymphadenopathy. No intraperitoneal or retroperitoneal lymphadenopy. No pelvic  sidewall lymphadenopathy.  Reproductive: The uterus has normal CT imaging appearance. There is no adnexal mass.  Other: There is some trace free fluid in the pelvis, mainly in the region of the right adnexal space.  Musculoskeletal: Bone windows reveal no worrisome lytic or sclerotic osseous lesions.  IMPRESSION: 1. No acute findings in the abdomen or pelvis. 2. Cholelithiasis 3. There is a trace amount of free fluid in the adnexal regions, right greater than left. This finding can be physiologic in a premenopausal female.   Electronically Signed  By: Kennith Center M.D.  On: 04/02/2015 16:48    Assessment and plan:  Abdominal pain: Mostly in suprapubic area Abdominal CT positive for trace fluid and adnexa which could be physiologic in the premenopausal female as per CT reading; presence of cholelithiasis (patient denies right upper quadrant pain)  Rheumatoid arthritis/connective tissue disorder She was previously followed at [facility] but has not been there in a while. Continue Plaquenil and azathioprine Advised to make appointment with rheumatologist.  Interstitial  lung disease: Continue prednisone.  Condyloma Acuminatum: Seen by general surgery and the plan is to refer her to [facility] for surgical procedure.  History of pulmonary embolism: Continue Xarelto  Cough variant asthma: Advised to pickup prednisone from pharmacy.  This note has been created with Education officer, environmental. Any transcriptional errors are unintentional.         Jaclyn Shaggy, MD. [facility] [phone] 04/09/2015, 11:21 AM

## 2015-04-09 NOTE — Progress Notes (Signed)
[id number] Donna Hall Patient here for HFU. BP-81/46... RECHECK BP-  Patient complains of SOB: O2- 88-94%  Patient complains of pain in bones, lower back pain, scaled at a 9, described as aching, throbbing pain. Medication gives relief. Pain has been present for about a month.  Patient would like refill on Condylox, Prednisone, Azathoprine

## 2015-04-09 NOTE — Patient Instructions (Signed)
Asthma, Adult Asthma is a recurring condition in which the airways tighten and narrow. Asthma can make it difficult to breathe. It can cause coughing, wheezing, and shortness of breath. Asthma episodes, also called asthma attacks, range from minor to life-threatening. Asthma cannot be cured, but medicines and lifestyle changes can help control it. CAUSES Asthma is believed to be caused by inherited (genetic) and environmental factors, but its exact cause is unknown. Asthma may be triggered by allergens, lung infections, or irritants in the air. Asthma triggers are different for each person. Common triggers include:   Animal dander.  Dust mites.  Cockroaches.  Pollen from trees or grass.  Mold.  Smoke.  Air pollutants such as dust, household cleaners, hair sprays, aerosol sprays, paint fumes, strong chemicals, or strong odors.  Cold air, weather changes, and winds (which increase molds and pollens in the air).  Strong emotional expressions such as crying or laughing hard.  Stress.  Certain medicines (such as aspirin) or types of drugs (such as beta-blockers).  Sulfites in foods and drinks. Foods and drinks that may contain sulfites include dried fruit, potato chips, and sparkling grape juice.  Infections or inflammatory conditions such as the flu, a cold, or an inflammation of the nasal membranes (rhinitis).  Gastroesophageal reflux disease (GERD).  Exercise or strenuous activity. SYMPTOMS Symptoms may occur immediately after asthma is triggered or many hours later. Symptoms include:  Wheezing.  Excessive nighttime or early morning coughing.  Frequent or severe coughing with a common cold.  Chest tightness.  Shortness of breath. DIAGNOSIS  The diagnosis of asthma is made by a review of your medical history and a physical exam. Tests may also be performed. These may include:  Lung function studies. These tests show how much air you breathe in and out.  Allergy  tests.  Imaging tests such as X-rays. TREATMENT  Asthma cannot be cured, but it can usually be controlled. Treatment involves identifying and avoiding your asthma triggers. It also involves medicines. There are 2 classes of medicine used for asthma treatment:   Controller medicines. These prevent asthma symptoms from occurring. They are usually taken every day.  Reliever or rescue medicines. These quickly relieve asthma symptoms. They are used as needed and provide short-term relief. Your health care provider will help you create an asthma action plan. An asthma action plan is a written plan for managing and treating your asthma attacks. It includes a list of your asthma triggers and how they may be avoided. It also includes information on when medicines should be taken and when their dosage should be changed. An action plan may also involve the use of a device called a peak flow meter. A peak flow meter measures how well the lungs are working. It helps you monitor your condition. HOME CARE INSTRUCTIONS   Take medicines only as directed by your health care provider. Speak with your health care provider if you have questions about how or when to take the medicines.  Use a peak flow meter as directed by your health care provider. Record and keep track of readings.  Understand and use the action plan to help minimize or stop an asthma attack without needing to seek medical care.  Control your home environment in the following ways to help prevent asthma attacks:  Do not smoke. Avoid being exposed to secondhand smoke.  Change your heating and air conditioning filter regularly.  Limit your use of fireplaces and wood stoves.  Get rid of pests (such as roaches   and mice) and their droppings.  Throw away plants if you see mold on them.  Clean your floors and dust regularly. Use unscented cleaning products.  Try to have someone else vacuum for you regularly. Stay out of rooms while they are  being vacuumed and for a short while afterward. If you vacuum, use a dust mask from a hardware store, a double-layered or microfilter vacuum cleaner bag, or a vacuum cleaner with a HEPA filter.  Replace carpet with wood, tile, or vinyl flooring. Carpet can trap dander and dust.  Use allergy-proof pillows, mattress covers, and box spring covers.  Wash bed sheets and blankets every week in hot water and dry them in a dryer.  Use blankets that are made of polyester or cotton.  Clean bathrooms and kitchens with bleach. If possible, have someone repaint the walls in these rooms with mold-resistant paint. Keep out of the rooms that are being cleaned and painted.  Wash hands frequently. SEEK MEDICAL CARE IF:   You have wheezing, shortness of breath, or a cough even if taking medicine to prevent attacks.  The colored mucus you cough up (sputum) is thicker than usual.  Your sputum changes from clear or white to yellow, green, gray, or bloody.  You have any problems that may be related to the medicines you are taking (such as a rash, itching, swelling, or trouble breathing).  You are using a reliever medicine more than 2-3 times per week.  Your peak flow is still at 50-79% of your personal best after following your action plan for 1 hour.  You have a fever. SEEK IMMEDIATE MEDICAL CARE IF:   You seem to be getting worse and are unresponsive to treatment during an asthma attack.  You are short of breath even at rest.  You get short of breath when doing very little physical activity.  You have difficulty eating, drinking, or talking due to asthma symptoms.  You develop chest pain.  You develop a fast heartbeat.  You have a bluish color to your lips or fingernails.  You are light-headed, dizzy, or faint.  Your peak flow is less than 50% of your personal best.   This information is not intended to replace advice given to you by your health care provider. Make sure you discuss any  questions you have with your health care provider.   Document Released: 06/12/2005 Document Revised: 03/03/2015 Document Reviewed: 01/09/2013 Elsevier Interactive Patient Education 2016 [employer]  

## 2015-04-09 NOTE — Telephone Encounter (Signed)
Called patient again and didn't get a response. Since she doesn't answer her phone, I will send her the information by mail.

## 2015-04-16 ENCOUNTER — Telehealth: Payer: Self-pay

## 2015-04-16 NOTE — Telephone Encounter (Signed)
This Case Manager placed call to patient to check on status and to inform patient that Dr. Venetia Night would like patient to be scheduled for a Transitional Care Clinic follow-up appointment next week. Discussed available appointments; however, patient indicated she had an appointment in [facility] next week and was uncertain of appointment time. This Case Manager instructed patient to call clinic or Case Manager back to schedule appointment when she determines date of appointment in [place]. Patient verbalized understanding. No new problems/concerns identified.  Landscape architect used for Geneticist, molecular Archivist # [phone]).

## 2015-04-20 ENCOUNTER — Other Ambulatory Visit: Payer: Self-pay | Admitting: Family Medicine

## 2015-04-21 ENCOUNTER — Other Ambulatory Visit: Payer: Self-pay

## 2015-04-21 NOTE — Telephone Encounter (Signed)
She received Norco and Tylenol No. 3 from her general surgeon on 04/07/15 and again received tramadol from me 2 days after. She is not due for any refills at this time. I will see her for a follow-up at her next office visit.

## 2015-04-22 ENCOUNTER — Telehealth: Payer: Self-pay

## 2015-04-22 NOTE — Telephone Encounter (Signed)
This Case Manager placed call to patient to discuss need for Transitional Care follow-up appointment. Patient agreeable and appointment scheduled for 05/03/15 at 1430 with Dr. Venetia Night. No transportation barriers noted. Attempted to inquire about patient's status; however, patient in a hurry to get off phone so unable to do so. Call completed using [employer] (Interpretor-Linda [phone]) for language translation.

## 2015-04-29 ENCOUNTER — Telehealth: Payer: Self-pay

## 2015-04-29 NOTE — Telephone Encounter (Signed)
This Case Manager placed call to patient to check on status and to remind her of her upcoming appointment on 05/03/15 at 1430 with Dr. Venetia Night. Patient denied any problems.  Patient indicated she would be at appointment and would have transportation. [employer] used for interpretation (Intepreter-Christina # [id number]).

## 2015-05-03 ENCOUNTER — Ambulatory Visit: Payer: Medicaid Other | Attending: Family Medicine | Admitting: Family Medicine

## 2015-05-03 ENCOUNTER — Encounter: Payer: Self-pay | Admitting: Family Medicine

## 2015-05-03 VITALS — BP 124/79 | HR 105 | Temp 99.1°F | Resp 20 | Ht 63.0 in | Wt 290.0 lb

## 2015-05-03 DIAGNOSIS — Z86711 Personal history of pulmonary embolism: Secondary | ICD-10-CM | POA: Diagnosis not present

## 2015-05-03 DIAGNOSIS — IMO0002 Reserved for concepts with insufficient information to code with codable children: Secondary | ICD-10-CM

## 2015-05-03 DIAGNOSIS — Z7901 Long term (current) use of anticoagulants: Secondary | ICD-10-CM | POA: Insufficient documentation

## 2015-05-03 DIAGNOSIS — M069 Rheumatoid arthritis, unspecified: Secondary | ICD-10-CM

## 2015-05-03 DIAGNOSIS — A630 Anogenital (venereal) warts: Secondary | ICD-10-CM | POA: Insufficient documentation

## 2015-05-03 DIAGNOSIS — J45991 Cough variant asthma: Secondary | ICD-10-CM

## 2015-05-03 DIAGNOSIS — M329 Systemic lupus erythematosus, unspecified: Secondary | ICD-10-CM

## 2015-05-03 DIAGNOSIS — J8410 Pulmonary fibrosis, unspecified: Secondary | ICD-10-CM

## 2015-05-03 DIAGNOSIS — I2782 Chronic pulmonary embolism: Secondary | ICD-10-CM

## 2015-05-03 DIAGNOSIS — J0120 Acute ethmoidal sinusitis, unspecified: Secondary | ICD-10-CM

## 2015-05-03 MED ORDER — TRAMADOL HCL 50 MG PO TABS: 50.0000 mg | ORAL_TABLET | Freq: Three times a day (TID) | ORAL | Status: DC | PRN

## 2015-05-03 MED ORDER — METHYLPREDNISOLONE SODIUM SUCC 40 MG IJ SOLR
62.5000 mg | Freq: Once | INTRAMUSCULAR | Status: AC
  Administered 2015-05-03: 64 mg via INTRAMUSCULAR

## 2015-05-03 MED ORDER — IPRATROPIUM BROMIDE 0.02 % IN SOLN: 0.5000 mg | Freq: Once | RESPIRATORY_TRACT | Status: DC

## 2015-05-03 MED ORDER — PREDNISONE 10 MG PO TABS: 10.0000 mg | ORAL_TABLET | Freq: Every day | ORAL | Status: DC

## 2015-05-03 MED ORDER — AMOXICILLIN 500 MG PO CAPS: 500.0000 mg | ORAL_CAPSULE | Freq: Three times a day (TID) | ORAL | Status: DC

## 2015-05-03 MED ORDER — BENZONATATE 100 MG PO CAPS: 100.0000 mg | ORAL_CAPSULE | Freq: Three times a day (TID) | ORAL | Status: DC | PRN

## 2015-05-03 NOTE — Patient Instructions (Signed)

## 2015-05-03 NOTE — Progress Notes (Signed)
Pt's here for TCC f/up. Pt reports pain this morning. She felt dizzy and c/o throat pain starting around 2am. Pt states she's having pain in her stomach and entire body Pt rates pain 10/10.  Pt states she ran out of Prednosine.Pt requesting med refill of other meds.

## 2015-05-03 NOTE — Progress Notes (Signed)
CC: Follow-up visit  HPI: [place] is a 29 y.o. female with a history of pulmonary embolism, rheumatoid arthritis, lupus (previously followed by rheumatology at [facility] but was lost to follow-up), interstitial lung disease (last seen by [facility] in 07/2014), cough variant asthma, condyloma acuminatum.  She was seen in the transitional clinic 3 weeks ago for a follow-up from hospitalization for asthma exacerbation and completed a prednisone taper. Today she complains of throat pain, myalgias since early hours of the morning. She has had rhinorrhea, post nasal drip. No fever. Also coughing with production of yellowish sputum and has a headache. Denies a history of sick contacts. She has been compliant with, Proventil and Dulera and has had no relief of symptoms. Requests a refill of prednisone and wants something for pain and for cough.   No Known Allergies Past Medical History  Diagnosis Date  . Psoriasis 2010    as a child  . Chronic pain disorder   . Obesity   . Chronic steroid use 2010  . Lupus (HCC) 2000  . Lupus (systemic lupus erythematosus) (HCC) 2010  . Rheumatoid arthritis(714.0) 2010  . Hypertension 2010  . Pneumonia "several times"  . Interstitial lung disease (HCC)     [place] 05/15/2014  . On home oxygen therapy     "2L; 24/7" (05/15/2014)  . Pulmonary embolism (HCC)     hx/notes 05/15/2014  . History of blood transfusion     "because white cells ate the red cells"  . Headache     "just when I have the pain from aching bones and psorasis and/or lupus symptoms" (05/15/2104)  . 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 Asthma   . Gestational diabetes 2006  . Depression   . Sleep apnea    Current Outpatient Prescriptions on File Prior to Visit  Medication Sig Dispense Refill  . acetaminophen-codeine (TYLENOL #3) 300-30 MG tablet   2  . albuterol (PROVENTIL HFA;VENTOLIN HFA)  108 (90 BASE) MCG/ACT inhaler Inhale 2 puffs into the lungs every 4 (four) hours as needed for wheezing or shortness of breath. 1 Inhaler 3  . CONDYLOX 0.5 % gel Apply topically 2 (two) times daily. 3.5 g 1  . HYDROcodone-acetaminophen (NORCO) 5-325 MG tablet Take 1 tablet by mouth every 6 (six) hours as needed for moderate pain. 30 tablet 0  . hydroxychloroquine (PLAQUENIL) 200 MG tablet Take 1 tablet (200 mg total) by mouth 2 (two) times daily. 60 tablet 3  . hydrOXYzine (ATARAX/VISTARIL) 10 MG tablet Take 1 tablet (10 mg total) by mouth every 8 (eight) hours as needed for itching. 90 tablet 5  . imiquimod (ALDARA) 5 % cream   1  . lidocaine (XYLOCAINE) 5 % ointment Apply 1 application topically as needed. 35.44 g 0  . mometasone-formoterol (DULERA) 100-5 MCG/ACT AERO Take 2 puffs first thing in am and then another 2 puffs about 12 hours later. 1 Inhaler 2  . oxyCODONE-acetaminophen (PERCOCET/ROXICET) 5-325 MG tablet Take 1-2 tablets by mouth every 6 (six) hours as needed for moderate pain or severe pain. 30 tablet 0  . predniSONE (DELTASONE) 20 MG tablet Take 2 tablets once daily for 5 days, then take 1 tablet once daily for 5 days, then STOP. 15 tablet 0  . rivaroxaban (XARELTO) 20 MG TABS tablet Take 1 tablet (20 mg total) by mouth daily with supper. 30 tablet 7  . traMADol (ULTRAM) 50  MG tablet Take 1 tablet (50 mg total) by mouth every 8 (eight) hours as needed. 30 tablet 0  . azaTHIOprine (IMURAN) 50 MG tablet Take 5 tablets (250 mg total) by mouth daily. (Patient not taking: Reported on 04/09/2015) 150 tablet 3   No current facility-administered medications on file prior to visit.   Family History  Problem Relation Age of Onset  . Diabetes Mother   . Hypertension Mother   . Cancer Neg Hx   . Early death Neg Hx   . Heart disease Neg Hx    Social History   Social History  . Marital Status: Single    Spouse Name: N/A  . Number of Children: 3  . Years of Education: 1    Occupational History  . Unemployed     Social History Main Topics  . Smoking status: Never Smoker   . Smokeless tobacco: Never Used  . Alcohol Use: No  . Drug Use: No  . Sexual Activity: Not Currently    Birth Control/ Protection: Injection   Other Topics Concern  . Not on file   Social History Narrative   Patient lives with husband and 3 children (33 in [place], 27 and 4) in [place]. She migrated form [place] in 2005. She does not work. Has completed the first grade. Cannot read and write.     Review of Systems: Constitutional: Negative for fever, chills, diaphoresis, activity change, appetite change and positive for myalgias and fatigue. HENT: see hpi Eyes: Negative for pain, discharge, redness, itching and visual disturbance. Respiratory: see hpi Cardiovascular: Negative for chest pain, palpitations and leg swelling. Gastrointestinal: Negative for abdominal distention. Genitourinary: Negative for dysuria, urgency, frequency, hematuria, flank pain, decreased urine volume, difficulty urinating and dyspareunia.  Musculoskeletal: Positive for body aches Neurological: Positive for for dizziness, negative for tremors, seizures, syncope, facial asymmetry, speech difficulty, weakness, numbness and headaches.  Hematological: Negative for adenopathy. Does not bruise/bleed easily. Psychiatric/Behavioral: Negative for hallucinations, behavioral problems, confusion, dysphoric mood, decreased concentration and agitation.    Objective:   Filed Vitals:   05/03/15 1107  BP: 124/79  Pulse: 105  Temp: 99.1 F (37.3 C)  Resp: 20    Physical Exam: Constitutional: Patient appears obese with cushingoid facies, well-developed and well-nourished. No distress. HENT: Normocephalic, atraumatic, External right and left ear normal. Mouth is marginal and maxillary sinus tenderness bilaterally. Oropharynx is erythematous.  Eyes: Conjunctivae and EOM are normal. PERRLA, no scleral  icterus. Neck: Normal ROM. Neck supple. No JVD. No tracheal deviation. No thyromegaly. CVS: Tachycardic rate, regular rhythm, S1/S2 +, no murmurs, no gallops, no carotid bruit.  Pulmonary: Normal respiratory effort, inspiratory and expiratory wheezing present. Abdominal: Soft. BS +,  no distension, tenderness, rebound or guarding.  Musculoskeletal: Normal range of motion. No edema and no tenderness.  Lymphadenopathy: No lymphadenopathy noted, cervical, inguinal or axillary Neuro: Alert. Normal reflexes, muscle tone coordination. No cranial nerve deficit. Skin: Skin is warm and dry. No rash noted. Not diaphoretic. No erythema. No pallor. Psychiatric: Normal mood and affect. Behavior, judgment, thought content normal.  Lab Results  Component Value Date   WBC 7.8 04/03/2015   HGB 10.7* 04/03/2015   HCT 37.6 04/03/2015   MCV 90.2 04/03/2015   PLT 309 04/03/2015   Lab Results  Component Value Date   CREATININE 0.43* 04/03/2015   BUN 8 04/03/2015   NA 141 04/03/2015   K 4.0 04/03/2015   CL 102 04/03/2015   CO2 33* 04/03/2015    Lab Results  Component  Value Date   HGBA1C 5.5 02/23/2014   Lipid Panel     Component Value Date/Time   CHOL 123 03/08/2012 0552   TRIG 71 03/08/2012 0552   HDL 45 03/08/2012 0552   CHOLHDL 2.7 03/08/2012 0552   VLDL 14 03/08/2012 0552   LDLCALC 64 03/08/2012 0552       Assessment and plan:  Rheumatoid arthritis/connective tissue disorder She was previously followed at [facility] but has not been there in a while. Continue Plaquenil and azathioprine Advised to make appointment with rheumatologist.  Interstitial lung disease: She has run out of her prednisone. Her medication list does not indicate long-term use of prednisone but review of pulmonary note indicates she is supposed to be on 10 mg daily and so I have prescribed that for 30 days until she sees her PCP for follow-up visit. I am giving her IM Solu-Medrol at today's visit due to  wheezing on exam and a low oxygen saturation of 91% which is down from 94% at her last office visit. We will call [facility] to obtain an appointment for her as she has not been seen there since 07/2014.  Condyloma Acuminatum: Seen by general surgery and the plan is to refer her to [facility] for surgical procedure. She has an upcoming appointment next week.  History of pulmonary embolism: Continue Xarelto  Cough variant asthma: Advised to pickup prednisone from pharmacy. Atrovent nebulizer treatment administered; holding off on albuterol due to tachycardia.  This note has been created with Education officer, environmental. Any transcriptional errors are unintentional.        Jaclyn Shaggy, MD. [facility] [phone] 05/03/2015, 11:39 AM

## 2015-05-06 ENCOUNTER — Ambulatory Visit: Payer: Self-pay | Admitting: Internal Medicine

## 2015-05-18 ENCOUNTER — Encounter: Payer: Self-pay | Admitting: Family Medicine

## 2015-05-18 ENCOUNTER — Telehealth: Payer: Self-pay

## 2015-05-18 ENCOUNTER — Ambulatory Visit: Payer: Medicaid Other | Attending: Family Medicine | Admitting: Family Medicine

## 2015-05-18 VITALS — BP 107/70 | HR 102 | Temp 99.2°F | Resp 16 | Ht 63.0 in | Wt 287.0 lb

## 2015-05-18 DIAGNOSIS — Z79899 Other long term (current) drug therapy: Secondary | ICD-10-CM | POA: Diagnosis not present

## 2015-05-18 DIAGNOSIS — Z7951 Long term (current) use of inhaled steroids: Secondary | ICD-10-CM | POA: Insufficient documentation

## 2015-05-18 DIAGNOSIS — M329 Systemic lupus erythematosus, unspecified: Secondary | ICD-10-CM | POA: Insufficient documentation

## 2015-05-18 DIAGNOSIS — Z7901 Long term (current) use of anticoagulants: Secondary | ICD-10-CM | POA: Insufficient documentation

## 2015-05-18 DIAGNOSIS — G894 Chronic pain syndrome: Secondary | ICD-10-CM | POA: Diagnosis not present

## 2015-05-18 DIAGNOSIS — R05 Cough: Secondary | ICD-10-CM

## 2015-05-18 DIAGNOSIS — Z86711 Personal history of pulmonary embolism: Secondary | ICD-10-CM | POA: Diagnosis not present

## 2015-05-18 DIAGNOSIS — Z0000 Encounter for general adult medical examination without abnormal findings: Secondary | ICD-10-CM

## 2015-05-18 DIAGNOSIS — J329 Chronic sinusitis, unspecified: Secondary | ICD-10-CM | POA: Insufficient documentation

## 2015-05-18 DIAGNOSIS — Z7952 Long term (current) use of systemic steroids: Secondary | ICD-10-CM | POA: Diagnosis not present

## 2015-05-18 DIAGNOSIS — R059 Cough, unspecified: Secondary | ICD-10-CM | POA: Insufficient documentation

## 2015-05-18 DIAGNOSIS — J849 Interstitial pulmonary disease, unspecified: Secondary | ICD-10-CM | POA: Diagnosis not present

## 2015-05-18 MED ORDER — ACETAMINOPHEN-CODEINE #3 300-30 MG PO TABS: 1.0000 | ORAL_TABLET | Freq: Three times a day (TID) | ORAL | Status: DC | PRN

## 2015-05-18 NOTE — Patient Instructions (Signed)
[place] was seen today for cough.  Diagnoses and all orders for this visit:  Cough -     acetaminophen-codeine (TYLENOL #3) 300-30 MG tablet; Take 1 tablet by mouth every 8 (eight) hours as needed for moderate pain.  Chronic pain syndrome -     acetaminophen-codeine (TYLENOL #3) 300-30 MG tablet; Take 1 tablet by mouth every 8 (eight) hours as needed for moderate pain.   Finish amoxicillin for sinusitis Tylenol # 3 for sore throat and cough. No evidence of pneumonia or pharyngitis  F/u in 4 weeks for cough/sinusitis f/u  Dr. Armen Pickup

## 2015-05-18 NOTE — Telephone Encounter (Signed)
Call placed to the patient to check on her status and to discuss scheduling a follow up appointment with Dr Armen Pickup. Tammy Sours, Spanish Interpreter # [phone], with [facility] [employer] assisted with the call.  The patient stated that she is " ok" and then noted that she is not breathing too well when she gets agitated.  She said that she has a cough that is occasionally productive for " yellow phlegm."  She stated that she is taking all of her medications as ordered, including the inhaler and she continues to use her O2. She said that she does not have a fever and has a 'little " wheeze at times. She said that her shortness of breath is " not bad."  Discussed the need to seek medical attention in the ED and she denied the need to be seen in the ED. She was agreeable to seeing Dr Armen Pickup today and an appointment was scheduled for today, 05/18/15 @ 1445.  She noted that she has transportation to the appointment.  Instructed her to call 911/ seek medical care in the ED if she experiences any increased difficulty breathing and she verbalized understanding and again noted that she does not need to go to the ED and she will be at her appointment at the [facility] this afternoon.

## 2015-05-18 NOTE — Progress Notes (Signed)
F/U cough Still with productive cough Pain scale #9 joint pain No suicide thought in the past two weeks No tobacco user

## 2015-05-18 NOTE — Assessment & Plan Note (Signed)
Finish amoxicillin for sinusitis Tylenol # 3 for sore throat and cough. No evidence of pneumonia or pharyngitis  F/u in 4 weeks for cough/sinusitis f/u

## 2015-05-18 NOTE — Progress Notes (Signed)
Subjective:  Patient ID: [place], female    DOB: [birth year]  Age: 29 y.o. MRN: [id number] Spanish interpreter used  CC: Cough   HPI [place] presents for cough  1. Cough: 29 year female with interstitial lung disease and lupus. Hx of PE on chronic xarelto. Off xarelto for past two weeks in preparation for surgery.  On chronic steroid at baseline dose of 10 mg daily. Currently on 10 course of amoxicillin for sinusitis. She had a delay in starting treatment because the medication was not ready for pick up after last OV. She reports headache that has improved, cough productive of yellow sputum intermittently, and sore throat. No fever, trouble swallowing, chest pain or shortness of breath that is worse than her baseline. She still uses 2 L via [place] at night and prn during the day at home.   Social History  Substance Use Topics  . Smoking status: Never Smoker   . Smokeless tobacco: Never Used  . Alcohol Use: No    Outpatient Prescriptions Prior to Visit  Medication Sig Dispense Refill  . acetaminophen-codeine (TYLENOL #3) 300-30 MG tablet   2  . albuterol (PROVENTIL HFA;VENTOLIN HFA) 108 (90 BASE) MCG/ACT inhaler Inhale 2 puffs into the lungs every 4 (four) hours as needed for wheezing or shortness of breath. 1 Inhaler 3  . amoxicillin (AMOXIL) 500 MG capsule Take 1 capsule (500 mg total) by mouth 3 (three) times daily. 30 capsule 0  . azaTHIOprine (IMURAN) 50 MG tablet Take 5 tablets (250 mg total) by mouth daily. 150 tablet 3  . hydroxychloroquine (PLAQUENIL) 200 MG tablet Take 1 tablet (200 mg total) by mouth 2 (two) times daily. 60 tablet 3  . predniSONE (DELTASONE) 10 MG tablet Take 1 tablet (10 mg total) by mouth daily with breakfast. 30 tablet 0  . traMADol (ULTRAM) 50 MG tablet Take 1 tablet (50 mg total) by mouth every 8 (eight) hours as needed. 30 tablet 0  . benzonatate (TESSALON) 100 MG capsule Take 1 capsule (100 mg total) by mouth 3 (three) times daily  as needed for cough. (Patient not taking: Reported on 05/18/2015) 30 capsule 0  . CONDYLOX 0.5 % gel Apply topically 2 (two) times daily. (Patient not taking: Reported on 05/18/2015) 3.5 g 1  . HYDROcodone-acetaminophen (NORCO) 5-325 MG tablet Take 1 tablet by mouth every 6 (six) hours as needed for moderate pain. (Patient not taking: Reported on 05/18/2015) 30 tablet 0  . hydrOXYzine (ATARAX/VISTARIL) 10 MG tablet Take 1 tablet (10 mg total) by mouth every 8 (eight) hours as needed for itching. (Patient not taking: Reported on 05/18/2015) 90 tablet 5  . imiquimod (ALDARA) 5 % cream   1  . ipratropium (ATROVENT) 0.02 % nebulizer solution Take 2.5 mLs (0.5 mg total) by nebulization once. (Patient not taking: Reported on 05/18/2015) 2.5 mL 0  . lidocaine (XYLOCAINE) 5 % ointment Apply 1 application topically as needed. (Patient not taking: Reported on 05/18/2015) 35.44 g 0  . mometasone-formoterol (DULERA) 100-5 MCG/ACT AERO Take 2 puffs first thing in am and then another 2 puffs about 12 hours later. (Patient not taking: Reported on 05/18/2015) 1 Inhaler 2  . oxyCODONE-acetaminophen (PERCOCET/ROXICET) 5-325 MG tablet Take 1-2 tablets by mouth every 6 (six) hours as needed for moderate pain or severe pain. (Patient not taking: Reported on 05/18/2015) 30 tablet 0  . rivaroxaban (XARELTO) 20 MG TABS tablet Take 1 tablet (20 mg total) by mouth daily with supper. (Patient not taking: Reported  on 05/18/2015) 30 tablet 7   No facility-administered medications prior to visit.    ROS Review of Systems  Constitutional: Negative for fever and chills.  HENT: Positive for sore throat.   Eyes: Negative for visual disturbance.  Respiratory: Positive for cough. Negative for shortness of breath.   Cardiovascular: Negative for chest pain.  Gastrointestinal: Negative for abdominal pain and blood in stool.  Musculoskeletal: Negative for back pain and arthralgias.  Skin: Negative for rash.  Allergic/Immunologic:  Negative for immunocompromised state.  Neurological: Positive for headaches.  Hematological: Negative for adenopathy. Does not bruise/bleed easily.  Psychiatric/Behavioral: Negative for suicidal ideas and dysphoric mood.    Objective:  BP 107/70 mmHg  Pulse 102  Temp(Src) 99.2 F (37.3 C) (Oral)  Resp 16  Ht 5\' 3"  (1.6 m)  SpO2 94%  LMP 04/28/2015  SpO2 Readings from Last 3 Encounters:  05/18/15 94%  05/03/15 91%  04/09/15 94%    BP/Weight 05/18/2015 05/03/2015 04/09/2015  Systolic BP 107 124 -  Diastolic BP 70 79 -  Wt. (Lbs) 287 290 291  BMI 50.85 51.38 53.21   Physical Exam  Constitutional: She is oriented to person, place, and time. She appears well-developed and well-nourished. No distress.  Obese   HENT:  Head: Normocephalic and atraumatic.  Mouth/Throat: Oropharynx is clear and moist.  Cardiovascular: Normal rate, regular rhythm, normal heart sounds and intact distal pulses.   Pulmonary/Chest: Effort normal. No respiratory distress. She has wheezes. She has no rales. She exhibits no tenderness.  Musculoskeletal: She exhibits no edema.  Neurological: She is alert and oriented to person, place, and time.  Skin: Skin is warm and dry. No rash noted.  Psychiatric: She has a normal mood and affect.    Assessment & Plan:   Problem List Items Addressed This Visit    Chronic pain syndrome (Chronic)   Relevant Medications   acetaminophen-codeine (TYLENOL #3) 300-30 MG tablet   Cough - Primary    Finish amoxicillin for sinusitis Tylenol # 3 for sore throat and cough. No evidence of pneumonia or pharyngitis  F/u in 4 weeks for cough/sinusitis f/u       Relevant Medications   acetaminophen-codeine (TYLENOL #3) 300-30 MG tablet    Other Visit Diagnoses    Healthcare maintenance        Relevant Orders    Flu Vaccine QUAD 36+ mos IM (Completed)       No orders of the defined types were placed in this encounter.    Follow-up: No Follow-up on file.   04/11/2015 MD

## 2015-06-15 ENCOUNTER — Ambulatory Visit: Payer: Self-pay | Admitting: Family Medicine

## 2015-06-23 ENCOUNTER — Telehealth: Payer: Self-pay | Admitting: Family Medicine

## 2015-06-23 DIAGNOSIS — G894 Chronic pain syndrome: Secondary | ICD-10-CM

## 2015-06-23 DIAGNOSIS — M329 Systemic lupus erythematosus, unspecified: Secondary | ICD-10-CM

## 2015-06-23 DIAGNOSIS — G4733 Obstructive sleep apnea (adult) (pediatric): Secondary | ICD-10-CM

## 2015-06-23 NOTE — Telephone Encounter (Signed)
Pt. Is also requesting a refill on predniSONE (DELTASONE) 10 MG tablet. Please f/u

## 2015-06-23 NOTE — Telephone Encounter (Signed)
Pt. Came in requesting a med refill on Tramadol. Pt. Stated she is in pain since she had her surgery.  Pt. Also needs a refill on azaTHIOprine (IMURAN) 50 MG tablet.  Pt. Also needs a refill on another medication but does not remember the name of the medication. Please f/u with pt.

## 2015-06-24 MED ORDER — PREDNISONE 10 MG PO TABS: 10.0000 mg | ORAL_TABLET | Freq: Every day | ORAL | Status: DC

## 2015-06-24 MED ORDER — AZATHIOPRINE 50 MG PO TABS: 250.0000 mg | ORAL_TABLET | Freq: Every day | ORAL | Status: DC

## 2015-06-24 MED ORDER — TRAMADOL HCL 50 MG PO TABS: 50.0000 mg | ORAL_TABLET | Freq: Three times a day (TID) | ORAL | Status: DC | PRN

## 2015-06-24 NOTE — Telephone Encounter (Signed)
All meds refilled Tramadol ready for pick up    

## 2015-06-24 NOTE — Telephone Encounter (Signed)
LVM Rx at front office ready to be pickup 

## 2015-07-06 MED FILL — hydrOXYzine HCL 10 MG TABS: 10 | 30 days supply | Qty: 90 | Fill #1

## 2015-07-11 ENCOUNTER — Encounter (HOSPITAL_COMMUNITY): Payer: Self-pay | Admitting: Emergency Medicine

## 2015-07-11 DIAGNOSIS — Z7901 Long term (current) use of anticoagulants: Secondary | ICD-10-CM

## 2015-07-11 DIAGNOSIS — J45901 Unspecified asthma with (acute) exacerbation: Secondary | ICD-10-CM | POA: Diagnosis present

## 2015-07-11 DIAGNOSIS — J9611 Chronic respiratory failure with hypoxia: Secondary | ICD-10-CM | POA: Diagnosis present

## 2015-07-11 DIAGNOSIS — M069 Rheumatoid arthritis, unspecified: Secondary | ICD-10-CM | POA: Diagnosis present

## 2015-07-11 DIAGNOSIS — Z6841 Body Mass Index (BMI) 40.0 and over, adult: Secondary | ICD-10-CM

## 2015-07-11 DIAGNOSIS — Z8249 Family history of ischemic heart disease and other diseases of the circulatory system: Secondary | ICD-10-CM

## 2015-07-11 DIAGNOSIS — Z7952 Long term (current) use of systemic steroids: Secondary | ICD-10-CM

## 2015-07-11 DIAGNOSIS — Z9981 Dependence on supplemental oxygen: Secondary | ICD-10-CM

## 2015-07-11 DIAGNOSIS — E662 Morbid (severe) obesity with alveolar hypoventilation: Secondary | ICD-10-CM | POA: Diagnosis present

## 2015-07-11 DIAGNOSIS — J189 Pneumonia, unspecified organism: Principal | ICD-10-CM | POA: Diagnosis present

## 2015-07-11 DIAGNOSIS — G894 Chronic pain syndrome: Secondary | ICD-10-CM | POA: Diagnosis present

## 2015-07-11 DIAGNOSIS — M329 Systemic lupus erythematosus, unspecified: Secondary | ICD-10-CM | POA: Diagnosis present

## 2015-07-11 DIAGNOSIS — Z79899 Other long term (current) drug therapy: Secondary | ICD-10-CM

## 2015-07-11 DIAGNOSIS — Z86711 Personal history of pulmonary embolism: Secondary | ICD-10-CM

## 2015-07-11 DIAGNOSIS — I10 Essential (primary) hypertension: Secondary | ICD-10-CM | POA: Diagnosis present

## 2015-07-11 LAB — CBC
HEMATOCRIT: 39 % (ref 36.0–46.0)
HEMOGLOBIN: 11.6 g/dL — AB (ref 12.0–15.0)
MCH: 24.1 pg — AB (ref 26.0–34.0)
MCHC: 29.7 g/dL — AB (ref 30.0–36.0)
MCV: 81.1 fL (ref 78.0–100.0)
Platelets: 293 10*3/uL (ref 150–400)
RBC: 4.81 MIL/uL (ref 3.87–5.11)
RDW: 17.1 % — ABNORMAL HIGH (ref 11.5–15.5)
WBC: 6.9 10*3/uL (ref 4.0–10.5)

## 2015-07-11 LAB — I-STAT TROPONIN, ED: Troponin i, poc: 0 ng/mL (ref 0.00–0.08)

## 2015-07-11 MED ORDER — ALBUTEROL SULFATE (2.5 MG/3ML) 0.083% IN NEBU
INHALATION_SOLUTION | RESPIRATORY_TRACT | Status: AC
  Filled 2015-07-11: qty 3

## 2015-07-11 MED ORDER — ALBUTEROL SULFATE (2.5 MG/3ML) 0.083% IN NEBU
5.0000 mg | INHALATION_SOLUTION | Freq: Once | RESPIRATORY_TRACT | Status: AC
  Administered 2015-07-11: 5 mg via RESPIRATORY_TRACT

## 2015-07-11 NOTE — ED Notes (Signed)
C/o chest pressure, sob, nausea, and headache.  Audible wheezing on arrival.  States she used her inhaler with no relief.

## 2015-07-12 ENCOUNTER — Encounter (HOSPITAL_COMMUNITY): Payer: Self-pay | Admitting: Emergency Medicine

## 2015-07-12 ENCOUNTER — Inpatient Hospital Stay (HOSPITAL_COMMUNITY)
Admission: EM | Admit: 2015-07-12 | Discharge: 2015-07-14 | DRG: 194 | Disposition: A | Discharge status: Home / Self Care | Payer: Medicaid Other | Attending: Internal Medicine | Admitting: Internal Medicine

## 2015-07-12 ENCOUNTER — Emergency Department (HOSPITAL_COMMUNITY): Payer: Medicaid Other

## 2015-07-12 DIAGNOSIS — E66813 Obesity, class 3: Secondary | ICD-10-CM | POA: Diagnosis present

## 2015-07-12 DIAGNOSIS — I10 Essential (primary) hypertension: Secondary | ICD-10-CM | POA: Diagnosis present

## 2015-07-12 DIAGNOSIS — M329 Systemic lupus erythematosus, unspecified: Secondary | ICD-10-CM | POA: Diagnosis present

## 2015-07-12 DIAGNOSIS — J45901 Unspecified asthma with (acute) exacerbation: Secondary | ICD-10-CM | POA: Diagnosis present

## 2015-07-12 DIAGNOSIS — G894 Chronic pain syndrome: Secondary | ICD-10-CM | POA: Diagnosis present

## 2015-07-12 DIAGNOSIS — J9611 Chronic respiratory failure with hypoxia: Secondary | ICD-10-CM | POA: Diagnosis present

## 2015-07-12 DIAGNOSIS — Z9981 Dependence on supplemental oxygen: Secondary | ICD-10-CM | POA: Diagnosis not present

## 2015-07-12 DIAGNOSIS — Z86711 Personal history of pulmonary embolism: Secondary | ICD-10-CM | POA: Diagnosis not present

## 2015-07-12 DIAGNOSIS — J9600 Acute respiratory failure, unspecified whether with hypoxia or hypercapnia: Secondary | ICD-10-CM | POA: Diagnosis present

## 2015-07-12 DIAGNOSIS — Z79899 Other long term (current) drug therapy: Secondary | ICD-10-CM | POA: Diagnosis not present

## 2015-07-12 DIAGNOSIS — J8410 Pulmonary fibrosis, unspecified: Secondary | ICD-10-CM | POA: Diagnosis present

## 2015-07-12 DIAGNOSIS — R0789 Other chest pain: Secondary | ICD-10-CM | POA: Diagnosis present

## 2015-07-12 DIAGNOSIS — E662 Morbid (severe) obesity with alveolar hypoventilation: Secondary | ICD-10-CM | POA: Diagnosis present

## 2015-07-12 DIAGNOSIS — Z6841 Body Mass Index (BMI) 40.0 and over, adult: Secondary | ICD-10-CM | POA: Diagnosis not present

## 2015-07-12 DIAGNOSIS — Z7952 Long term (current) use of systemic steroids: Secondary | ICD-10-CM | POA: Diagnosis not present

## 2015-07-12 DIAGNOSIS — Z7901 Long term (current) use of anticoagulants: Secondary | ICD-10-CM | POA: Diagnosis not present

## 2015-07-12 DIAGNOSIS — M069 Rheumatoid arthritis, unspecified: Secondary | ICD-10-CM | POA: Diagnosis present

## 2015-07-12 DIAGNOSIS — J189 Pneumonia, unspecified organism: Secondary | ICD-10-CM | POA: Diagnosis present

## 2015-07-12 DIAGNOSIS — G4733 Obstructive sleep apnea (adult) (pediatric): Secondary | ICD-10-CM | POA: Diagnosis present

## 2015-07-12 DIAGNOSIS — J9601 Acute respiratory failure with hypoxia: Secondary | ICD-10-CM | POA: Diagnosis present

## 2015-07-12 DIAGNOSIS — R079 Chest pain, unspecified: Secondary | ICD-10-CM | POA: Diagnosis present

## 2015-07-12 DIAGNOSIS — Z8249 Family history of ischemic heart disease and other diseases of the circulatory system: Secondary | ICD-10-CM | POA: Diagnosis not present

## 2015-07-12 LAB — BASIC METABOLIC PANEL
ANION GAP: 9 (ref 5–15)
BUN: 8 mg/dL (ref 6–20)
CO2: 26 mmol/L (ref 22–32)
Calcium: 9.1 mg/dL (ref 8.9–10.3)
Chloride: 102 mmol/L (ref 101–111)
Creatinine, Ser: 0.53 mg/dL (ref 0.44–1.00)
GFR calc Af Amer: >60 mL/min (ref >60)
Glucose, Bld: 124 mg/dL — ABNORMAL HIGH (ref 65–99)
POTASSIUM: 4.2 mmol/L (ref 3.5–5.1)
SODIUM: 137 mmol/L (ref 135–145)

## 2015-07-12 LAB — POC URINE PREG, ED: Preg Test, Ur: NEGATIVE

## 2015-07-12 LAB — TROPONIN I: Troponin I: <0.03 ng/mL (ref <0.031)

## 2015-07-12 MED ORDER — METHYLPREDNISOLONE SODIUM SUCC 125 MG IJ SOLR
125.0000 mg | Freq: Once | INTRAMUSCULAR | Status: AC
  Administered 2015-07-12: 125 mg via INTRAVENOUS
  Filled 2015-07-12: qty 2

## 2015-07-12 MED ORDER — RIVAROXABAN 20 MG PO TABS: 20.0000 mg | ORAL_TABLET | Freq: Every day | ORAL | Status: DC

## 2015-07-12 MED ORDER — DEXTROSE 5 % IV SOLN
1.0000 g | INTRAVENOUS | Status: DC
  Administered 2015-07-12 – 2015-07-14 (×3): 1 g via INTRAVENOUS
  Filled 2015-07-12 (×3): qty 10

## 2015-07-12 MED ORDER — FENTANYL CITRATE (PF) 100 MCG/2ML IJ SOLN
100.0000 ug | Freq: Once | INTRAMUSCULAR | Status: AC
  Administered 2015-07-12: 100 ug via INTRAVENOUS
  Filled 2015-07-12: qty 2

## 2015-07-12 MED ORDER — TRAMADOL HCL 50 MG PO TABS: 50.0000 mg | ORAL_TABLET | Freq: Four times a day (QID) | ORAL | Status: DC | PRN

## 2015-07-12 MED ORDER — HYDROXYCHLOROQUINE SULFATE 200 MG PO TABS: 200.0000 mg | ORAL_TABLET | Freq: Two times a day (BID) | ORAL | Status: DC

## 2015-07-12 MED ORDER — TRAMADOL HCL 50 MG PO TABS
50.0000 mg | ORAL_TABLET | Freq: Four times a day (QID) | ORAL | Status: DC | PRN
  Administered 2015-07-12 (×2): 50 mg via ORAL
  Filled 2015-07-12 (×3): qty 1

## 2015-07-12 MED ORDER — SODIUM CHLORIDE 0.9 % IV SOLN
INTRAVENOUS | Status: AC
  Administered 2015-07-12: 10:00:00 via INTRAVENOUS

## 2015-07-12 MED ORDER — AZATHIOPRINE 50 MG PO TABS
250.0000 mg | ORAL_TABLET | Freq: Every day | ORAL | Status: DC
  Administered 2015-07-12 – 2015-07-14 (×3): 250 mg via ORAL
  Filled 2015-07-12 (×3): qty 5

## 2015-07-12 MED ORDER — AZITHROMYCIN 500 MG PO TABS
500.0000 mg | ORAL_TABLET | Freq: Every day | ORAL | Status: DC
  Administered 2015-07-13 – 2015-07-14 (×2): 500 mg via ORAL
  Filled 2015-07-12 (×2): qty 1

## 2015-07-12 MED ORDER — DEXTROSE 5 % IV SOLN
1.0000 g | Freq: Once | INTRAVENOUS | Status: AC
  Administered 2015-07-12: 1 g via INTRAVENOUS
  Filled 2015-07-12: qty 10

## 2015-07-12 MED ORDER — METHYLPREDNISOLONE SODIUM SUCC 125 MG IJ SOLR
60.0000 mg | Freq: Four times a day (QID) | INTRAMUSCULAR | Status: DC
  Administered 2015-07-12 – 2015-07-13 (×6): 60 mg via INTRAVENOUS
  Filled 2015-07-12 (×6): qty 2

## 2015-07-12 MED ORDER — KETOROLAC TROMETHAMINE 30 MG/ML IJ SOLN
30.0000 mg | Freq: Once | INTRAMUSCULAR | Status: AC
  Administered 2015-07-12: 30 mg via INTRAVENOUS
  Filled 2015-07-12: qty 1

## 2015-07-12 MED ORDER — HYDROXYZINE HCL 10 MG PO TABS: 10.0000 mg | ORAL_TABLET | Freq: Three times a day (TID) | ORAL | Status: DC | PRN

## 2015-07-12 MED ORDER — HYDROXYCHLOROQUINE SULFATE 200 MG PO TABS
200.0000 mg | ORAL_TABLET | Freq: Two times a day (BID) | ORAL | Status: DC
  Administered 2015-07-12 – 2015-07-14 (×5): 200 mg via ORAL
  Filled 2015-07-12 (×6): qty 1

## 2015-07-12 MED ORDER — DM-GUAIFENESIN ER 30-600 MG PO TB12: 1.0000 | ORAL_TABLET | Freq: Two times a day (BID) | ORAL | Status: DC | PRN

## 2015-07-12 MED ORDER — OXYCODONE HCL 5 MG PO TABS
5.0000 mg | ORAL_TABLET | Freq: Three times a day (TID) | ORAL | Status: DC | PRN
  Administered 2015-07-12: 5 mg via ORAL
  Filled 2015-07-12: qty 1

## 2015-07-12 MED ORDER — OXYCODONE HCL 5 MG PO TABS
5.0000 mg | ORAL_TABLET | Freq: Four times a day (QID) | ORAL | Status: DC | PRN
  Administered 2015-07-13 – 2015-07-14 (×3): 5 mg via ORAL
  Filled 2015-07-12 (×3): qty 1

## 2015-07-12 MED ORDER — AZITHROMYCIN 250 MG PO TABS
500.0000 mg | ORAL_TABLET | ORAL | Status: DC
  Filled 2015-07-12: qty 2

## 2015-07-12 MED ORDER — TRAMADOL HCL 50 MG PO TABS
100.0000 mg | ORAL_TABLET | Freq: Four times a day (QID) | ORAL | Status: DC | PRN
  Administered 2015-07-12: 50 mg via ORAL
  Administered 2015-07-13 – 2015-07-14 (×3): 100 mg via ORAL
  Filled 2015-07-12 (×4): qty 2

## 2015-07-12 MED ORDER — ALBUTEROL SULFATE (2.5 MG/3ML) 0.083% IN NEBU
5.0000 mg | INHALATION_SOLUTION | Freq: Once | RESPIRATORY_TRACT | Status: AC
  Administered 2015-07-12: 5 mg via RESPIRATORY_TRACT
  Filled 2015-07-12: qty 6

## 2015-07-12 MED ORDER — DEXTROSE 5 % IV SOLN
500.0000 mg | Freq: Once | INTRAVENOUS | Status: AC
  Administered 2015-07-12: 500 mg via INTRAVENOUS
  Filled 2015-07-12: qty 500

## 2015-07-12 MED ORDER — IPRATROPIUM-ALBUTEROL 0.5-2.5 (3) MG/3ML IN SOLN
3.0000 mL | Freq: Four times a day (QID) | RESPIRATORY_TRACT | Status: DC
  Administered 2015-07-13: 3 mL via RESPIRATORY_TRACT
  Filled 2015-07-12: qty 3

## 2015-07-12 MED ORDER — IPRATROPIUM-ALBUTEROL 0.5-2.5 (3) MG/3ML IN SOLN
3.0000 mL | RESPIRATORY_TRACT | Status: DC
  Administered 2015-07-12 (×3): 3 mL via RESPIRATORY_TRACT
  Filled 2015-07-12 (×3): qty 3

## 2015-07-12 NOTE — ED Provider Notes (Signed)
CSN: [id number]     Arrival date & time 07/11/15  2320 History  By signing my name below, I, Budd Palmer, attest that this documentation has been prepared under the direction and in the presence of Ismaeel Arvelo, MD. Electronically Signed: Budd Palmer, ED Scribe. 07/12/2015. 3:54 AM.      Chief Complaint  Patient presents with  . Chest Pain   Patient is a 30 y.o. female presenting with chest pain. The history is provided by the patient. The history is limited by a language barrier. No language interpreter was used.  Chest Pain Pain location:  L chest Pain quality: dull   Pain severity:  Moderate Onset quality:  Gradual Timing:  Constant Progression:  Worsening Chronicity:  New Context: at rest   Relieved by:  Nothing Worsened by:  Nothing tried Ineffective treatments: narcotics. Associated symptoms: cough, fever (subjective), headache and nausea   Associated symptoms: no palpitations   Associated symptoms comment:  Elbow pain arthralgias Fever:    Timing:  Intermittent   Temp source:  Subjective   Progression:  Partially resolved Risk factors: hypertension and obesity    HPI Comments: Donna Hall is a 30 y.o. female with a PMHx of PE, HTN, chronic pain disorder, and lupus who presents to the Emergency Department complaining of constant, left-sided, pressure-like CP onset a few days ago. She reports associated left shoulder pain, headache, cough, SOB, nausea, subjective fever, generalized myalgias, bilateral elbow pain, and wheezing. She notes a recent sick contact. Pt denies leg pain.   Past Medical History  Diagnosis Date  . Psoriasis 2010    as a child  . Chronic pain disorder   . Obesity   . Chronic steroid use 2010  . Lupus (HCC) 2000  . Lupus (systemic lupus erythematosus) (HCC) 2010  . Rheumatoid arthritis(714.0) 2010  . Hypertension 2010  . Pneumonia "several times"  . Interstitial lung disease (HCC)     [name] 05/15/2014  . On home oxygen therapy      "2L; 24/7" (05/15/2014)  . Pulmonary embolism (HCC)     hx/notes 05/15/2014  . History of blood transfusion     "because white cells ate the red cells"  . Headache     "just when I have the pain from aching bones and psorasis and/or lupus symptoms" (05/15/2104)  . 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 Asthma   . Gestational diabetes 2006  . Depression   . Sleep apnea    Past Surgical History  Procedure Laterality Date  . Thigh / knee soft tissue biopsy Right 2011    thigh  . Cesarean section  2010   Family History  Problem Relation Age of Onset  . Diabetes Mother   . Hypertension Mother   . Cancer Neg Hx   . Early death Neg Hx   . Heart disease Neg Hx    Social History  Substance Use Topics  . Smoking status: Never Smoker   . Smokeless tobacco: Never Used  . Alcohol Use: No   OB History    No data available     Review of Systems  Constitutional: Positive for fever (subjective).  Respiratory: Positive for cough and wheezing.   Cardiovascular: Positive for chest pain. Negative for palpitations and leg swelling.  Gastrointestinal: Positive for nausea.  Musculoskeletal: Positive for myalgias and arthralgias.  Neurological: Positive for headaches.  All other systems reviewed and are negative.  Allergies  Review of patient's allergies indicates no known allergies.  Home Medications   Prior to Admission medications   Medication Sig Start Date End Date Taking? Authorizing Provider  acetaminophen-codeine (TYLENOL #3) 300-30 MG tablet Take 1 tablet by mouth every 8 (eight) hours as needed for moderate pain. 05/18/15   Josalyn Funches, MD  albuterol (PROVENTIL HFA;VENTOLIN HFA) 108 (90 BASE) MCG/ACT inhaler Inhale 2 puffs into the lungs every 4 (four) hours as needed for wheezing or shortness of breath. 04/03/15   Osvaldo Shipper, MD  amoxicillin (AMOXIL) 500 MG capsule Take 1 capsule (500 mg total) by  mouth 3 (three) times daily. 05/03/15   Jaclyn Shaggy, MD  azaTHIOprine (IMURAN) 50 MG tablet Take 5 tablets (250 mg total) by mouth daily. 06/24/15   Josalyn Funches, MD  CONDYLOX 0.5 % gel Apply topically 2 (two) times daily. Patient not taking: Reported on 05/18/2015 04/09/15   Jaclyn Shaggy, MD  hydroxychloroquine (PLAQUENIL) 200 MG tablet Take 1 tablet (200 mg total) by mouth 2 (two) times daily. 10/26/14   Donavan Foil, MD  hydrOXYzine (ATARAX/VISTARIL) 10 MG tablet Take 1 tablet (10 mg total) by mouth every 8 (eight) hours as needed for itching. Patient not taking: Reported on 05/18/2015 02/05/15   Vivianne Master, PA-C  imiquimod [facility]) 5 % cream  03/09/15   Historical Provider, MD  ipratropium (ATROVENT) 0.02 % nebulizer solution Take 2.5 mLs (0.5 mg total) by nebulization once. Patient not taking: Reported on 05/18/2015 05/03/15   Jaclyn Shaggy, MD  lidocaine (XYLOCAINE) 5 % ointment Apply 1 application topically as needed. Patient not taking: Reported on 05/18/2015 04/07/15   Ricarda Frame, MD  mometasone-formoterol [facility]) 100-5 MCG/ACT AERO Take 2 puffs first thing in am and then another 2 puffs about 12 hours later. Patient not taking: Reported on 05/18/2015 04/03/15   Osvaldo Shipper, MD  predniSONE (DELTASONE) 10 MG tablet Take 1 tablet (10 mg total) by mouth daily with breakfast. 06/24/15   Dessa Phi, MD  rivaroxaban (XARELTO) 20 MG TABS tablet Take 1 tablet (20 mg total) by mouth daily with supper. Patient not taking: Reported on 05/18/2015 04/02/14   Dessa Phi, MD  traMADol (ULTRAM) 50 MG tablet Take 1 tablet (50 mg total) by mouth every 8 (eight) hours as needed. 06/24/15   Josalyn Funches, MD   BP 131/69 mmHg  Pulse 98  Temp(Src) 98.8 F (37.1 C) (Oral)  Resp 24  SpO2 95%  LMP 06/27/2015 Physical Exam  Constitutional: She is oriented to person, place, and time. She appears well-developed and well-nourished.  HENT:  Head: Normocephalic and atraumatic.   Mouth/Throat: Oropharynx is clear and moist.  Eyes: Conjunctivae are normal. Pupils are equal, round, and reactive to light. Right eye exhibits no discharge. Left eye exhibits no discharge.  Neck: Normal range of motion. Neck supple. No tracheal deviation present.  Cardiovascular: Normal rate, regular rhythm, normal heart sounds and intact distal pulses.  Exam reveals no gallop and no friction rub.   No murmur heard. Pulmonary/Chest: Effort normal. No stridor. No respiratory distress. She has wheezes. She has rales. She exhibits tenderness.  Reproducible muscular TTP  Abdominal: Soft. Bowel sounds are normal. There is no tenderness. There is no rebound and no guarding.  Musculoskeletal: Normal range of motion. She exhibits no edema or tenderness.  Neurological: She is alert and oriented to person, place, and time. She has normal reflexes. Coordination normal.  Skin: Skin is warm and dry. No rash noted. She is not diaphoretic. No  erythema.  Psychiatric: She has a normal mood and affect.  Nursing note and vitals reviewed.   ED Course  Procedures  DIAGNOSTIC STUDIES: Oxygen Saturation is 95% on RA, adequate by my interpretation.    COORDINATION OF CARE: 3:49 AM - Discussed plans to order a breathing treatment and call a translator. Pt advised of plan for treatment and pt agrees.  Labs Review Labs Reviewed  BASIC METABOLIC PANEL - Abnormal; Notable for the following:    Glucose, Bld 124 (*)    All other components within normal limits  CBC - Abnormal; Notable for the following:    Hemoglobin 11.6 (*)    MCH 24.1 (*)    MCHC 29.7 (*)    RDW 17.1 (*)    All other components within normal limits  I-STAT TROPOININ, ED    Imaging Review Dg Chest 2 View  07/12/2015  CLINICAL DATA:  30 year old female with chest pressure EXAM: CHEST  2 VIEW COMPARISON:  Radiograph dated 04/11/2015 FINDINGS: There is a patchy area of airspace opacity involving the left lower lobe most compatible with  developing pneumonia. Clinical correlation and follow-up recommended. No significant pleural effusion. No pneumothorax. Stable cardiac silhouette. The osseous structures appear unremarkable. IMPRESSION: Left lower lobe airspace opacity concerning for pneumonia. Electronically Signed   By: Elgie Collard M.D.   On: 07/12/2015 01:07   I have personally reviewed and evaluated these images and lab results as part of my medical decision-making.   EKG Interpretation   Date/Time:  Sunday July 11 2015 23:22:40 EST Ventricular Rate:  89 PR Interval:  138 QRS Duration: 86 QT Interval:  382 QTC Calculation: 464 R Axis:   64 Text Interpretation:  Normal sinus rhythm Confirmed by [facility]  MD,  Jaydian Santana ([id number]) on 07/12/2015 3:43:31 AM      MDM   Final diagnoses:  None    Medications  albuterol (PROVENTIL) (2.5 MG/3ML) 0.083% nebulizer solution (  Not Given 07/11/15 2347)  albuterol (PROVENTIL) (2.5 MG/3ML) 0.083% nebulizer solution (  Not Given 07/11/15 2347)  azithromycin (ZITHROMAX) 500 mg in dextrose 5 % 250 mL IVPB (500 mg Intravenous New Bag/Given 07/12/15 0523)  albuterol (PROVENTIL) (2.5 MG/3ML) 0.083% nebulizer solution 5 mg (5 mg Nebulization Given 07/11/15 2335)  albuterol (PROVENTIL) (2.5 MG/3ML) 0.083% nebulizer solution 5 mg (5 mg Nebulization Given 07/12/15 0424)  methylPREDNISolone sodium succinate (SOLU-MEDROL) 125 mg/2 mL injection 125 mg (125 mg Intravenous Given 07/12/15 0429)  ketorolac (TORADOL) 30 MG/ML injection 30 mg (30 mg Intravenous Given 07/12/15 0524)  cefTRIAXone (ROCEPHIN) 1 g in dextrose 5 % 50 mL IVPB (0 g Intravenous Stopped 07/12/15 0459)   Results for orders placed or performed during the hospital encounter of 07/12/15  Basic metabolic panel  Result Value Ref Range   Sodium 137 135 - 145 mmol/L   Potassium 4.2 3.5 - 5.1 mmol/L   Chloride 102 101 - 111 mmol/L   CO2 26 22 - 32 mmol/L   Glucose, Bld 124 (H) 65 - 99 mg/dL   BUN 8 6 - 20 mg/dL    Creatinine, Ser 3.66 0.44 - 1.00 mg/dL   Calcium 9.1 8.9 - 81.5 mg/dL   GFR calc non Af Amer >60 >60 mL/min   GFR calc Af Amer >60 >60 mL/min   Anion gap 9 5 - 15  CBC  Result Value Ref Range   WBC 6.9 4.0 - 10.5 K/uL   RBC 4.81 3.87 - 5.11 MIL/uL   Hemoglobin 11.6 (L) 12.0 - 15.0 g/dL  HCT 39.0 36.0 - 46.0 %   MCV 81.1 78.0 - 100.0 fL   MCH 24.1 (L) 26.0 - 34.0 pg   MCHC 29.7 (L) 30.0 - 36.0 g/dL   RDW 82.9 (H) 93.7 - 16.9 %   Platelets 293 150 - 400 K/uL  I-stat troponin, ED (not at [facility])  Result Value Ref Range   Troponin i, poc 0.00 0.00 - 0.08 ng/mL   Comment 3          POC Urine Pregnancy, ED (do NOT order at [facility])  Result Value Ref Range   Preg Test, Ur NEGATIVE NEGATIVE   Dg Chest 2 View  07/12/2015  CLINICAL DATA:  31 year old female with chest pressure EXAM: CHEST  2 VIEW COMPARISON:  Radiograph dated 04/11/2015 FINDINGS: There is a patchy area of airspace opacity involving the left lower lobe most compatible with developing pneumonia. Clinical correlation and follow-up recommended. No significant pleural effusion. No pneumothorax. Stable cardiac silhouette. The osseous structures appear unremarkable. IMPRESSION: Left lower lobe airspace opacity concerning for pneumonia. Electronically Signed   By: Elgie Collard M.D.   On: 07/12/2015 01:07    Will admit for CAP with hypoxia.    I personally performed the services described in this documentation, which was scribed in my presence. The recorded information has been reviewed and is accurate.     Cy Blamer, MD 07/12/15 [phone]

## 2015-07-12 NOTE — ED Notes (Signed)
Pt oob to br refuses bsc

## 2015-07-12 NOTE — H&P (Signed)
[facility] History and Physical  Donna Hall XMI:[id number] DOB: 05-20-1986 DOA: 07/12/2015  Referring physician: Nicanor Hall PCP: Donna Paula, MD   Chief Complaint: sob/chest pain  HPI: Donna Hall is a 30 y.o. female with a history of pulmonary embolism, rheumatoid arthritis, connective tissue disorder, interstitial lung disease, pression anxiety morbid obesity presents to emergency department with the chief complaint of shortness of breath and chest pain. Initial evaluation in the emergency department reveals acute respiratory failure with hypoxia chest x-ray concerning for pneumonia.  Since speaks very little [place]. She reports 2-3 day history of gradual worsening shortness of breath wheezing cough. Associated symptoms include pressure-like chest pain on the left side headache nausea subjective fever and wheezes. She denies abdominal pain diarrhea dysuria hematuria frequency or urgency. He denies lower extremity edema. Reports not having been able to refill most of her medications of late.  In the emergency department she is provided with azithromycin, Rocephin, Solu-Medrol and nebulizers.   Review of Systems:  And point review of systems complete and all are negative except as indicated in the history of present illness   Past Medical History  Diagnosis Date  . Psoriasis 2010    as a child  . Chronic pain disorder   . Obesity   . Chronic steroid use 2010  . Lupus (HCC) 2000  . Lupus (systemic lupus erythematosus) (HCC) 2010  . Rheumatoid arthritis(714.0) 2010  . Hypertension 2010  . Pneumonia "several times"  . Interstitial lung disease (HCC)     [name] 05/15/2014  . On home oxygen therapy     "2L; 24/7" (05/15/2014)  . Pulmonary embolism (HCC)     hx/notes 05/15/2014  . History of blood transfusion     "because white cells ate the red cells"  . Headache     "just when I have the pain from aching bones and psorasis and/or lupus symptoms"  (05/15/2104)  . 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 Asthma   . Gestational diabetes 2006  . Depression   . Sleep apnea    Past Surgical History  Procedure Laterality Date  . Thigh / knee soft tissue biopsy Right 2011    thigh  . Cesarean section  2010   Social History:  reports that she has never smoked. She has never used smokeless tobacco. She reports that she does not drink alcohol or use illicit drugs.  No Known Allergies  Family History  Problem Relation Age of Onset  . Diabetes Mother   . Hypertension Mother   . Cancer Neg Hx   . Early death Neg Hx   . Heart disease Neg Hx      Prior to Admission medications   Medication Sig Start Date End Date Taking? Authorizing Provider  albuterol (PROVENTIL HFA;VENTOLIN HFA) 108 (90 BASE) MCG/ACT inhaler Inhale 2 puffs into the lungs every 4 (four) hours as needed for wheezing or shortness of breath. 04/03/15  Yes Osvaldo Shipper, MD  azaTHIOprine (IMURAN) 50 MG tablet Take 5 tablets (250 mg total) by mouth daily. 06/24/15  Yes Josalyn Funches, MD  mometasone-formoterol (DULERA) 100-5 MCG/ACT AERO Take 2 puffs first thing in am and then another 2 puffs about 12 hours later. 04/03/15  Yes Osvaldo Shipper, MD  predniSONE (DELTASONE) 10 MG tablet Take 1 tablet (10 mg total) by mouth daily with breakfast. 06/24/15  Yes Josalyn Funches, MD  traMADol (ULTRAM) 50 MG tablet Take 1  tablet (50 mg total) by mouth every 8 (eight) hours as needed. 06/24/15  Yes Josalyn Funches, MD  acetaminophen-codeine (TYLENOL #3) 300-30 MG tablet Take 1 tablet by mouth every 8 (eight) hours as needed for moderate pain. Patient not taking: Reported on 07/12/2015 05/18/15   Donna Phi, MD  amoxicillin (AMOXIL) 500 MG capsule Take 1 capsule (500 mg total) by mouth 3 (three) times daily. Patient not taking: Reported on 07/12/2015 05/03/15   Donna Shaggy, MD  CONDYLOX 0.5 % gel Apply topically 2 (two)  times daily. Patient not taking: Reported on 05/18/2015 04/09/15   Donna Shaggy, MD  hydroxychloroquine (PLAQUENIL) 200 MG tablet Take 1 tablet (200 mg total) by mouth 2 (two) times daily. Patient not taking: Reported on 07/12/2015 10/26/14   Donna Blackwater Moding, MD  hydrOXYzine (ATARAX/VISTARIL) 10 MG tablet Take 1 tablet (10 mg total) by mouth every 8 (eight) hours as needed for itching. Patient not taking: Reported on 05/18/2015 02/05/15   Donna Master, PA-C  ipratropium (ATROVENT) 0.02 % nebulizer solution Take 2.5 mLs (0.5 mg total) by nebulization once. Patient not taking: Reported on 05/18/2015 05/03/15   Donna Shaggy, MD  lidocaine (XYLOCAINE) 5 % ointment Apply 1 application topically as needed. Patient not taking: Reported on 05/18/2015 04/07/15   Donna Frame, MD  rivaroxaban (XARELTO) 20 MG TABS tablet Take 1 tablet (20 mg total) by mouth daily with supper. Patient not taking: Reported on 05/18/2015 04/02/14   Donna Phi, MD   Physical Exam: Filed Vitals:   07/12/15 0535 07/12/15 0545 07/12/15 0630 07/12/15 0715  BP:  102/68 107/60 97/58  Pulse:  82 81 65  Temp:      TempSrc:      Resp:  20 18 20   SpO2: 95% 99% 99% 98%    Wt Readings from Last 3 Encounters:  05/18/15 130.182 kg (287 lb)  05/03/15 131.543 kg (290 lb)  04/09/15 131.997 kg (291 lb)    General:  Appears calm and comfortable, obese somewhat lethargic  Eyes: PERRL, normal lids, irises & conjunctiva ENT: grossly normal hearing, mucous membranes of her mouth are moist and pink  Neck: no LAD, masses or thyromegaly Cardiovascular: RRR, no m/r/g. No LE edema.  Respiratory:  Normal respiratory effort. sounds are quite diminished throughout do here diffuse crackling up to mid lobe faint end expiratory wheezing particularly anterior  Abdomen: soft, ntnd, obese soft no guarding or rebounding  Skin: no rash or induration seen on limited exam Musculoskeletal: grossly normal tone BUE/BLE Psychiatric: grossly normal  mood and affect, speech fluent and appropriate Neurologic: grossly non-focal. Follows commands moves all extremities           Labs on Admission:  Basic Metabolic Panel:  Recent Labs Lab 07/11/15 2338  NA 137  K 4.2  CL 102  CO2 26  GLUCOSE 124*  BUN 8  CREATININE 0.53  CALCIUM 9.1   Liver Function Tests: No results for input(s): AST, ALT, ALKPHOS, BILITOT, PROT, ALBUMIN in the last 168 hours. No results for input(s): LIPASE, AMYLASE in the last 168 hours. No results for input(s): AMMONIA in the last 168 hours. CBC:  Recent Labs Lab 07/11/15 2338  WBC 6.9  HGB 11.6*  HCT 39.0  MCV 81.1  PLT 293   Cardiac Enzymes: No results for input(s): CKTOTAL, CKMB, CKMBINDEX, TROPONINI in the last 168 hours.  BNP (last 3 results)  Recent Labs  09/11/14 1407 11/19/14 1349  BNP 12.0 8.7    ProBNP (last 3 results) No  results for input(s): PROBNP in the last 8760 hours.  CBG: No results for input(s): GLUCAP in the last 168 hours.  Radiological Exams on Admission: Dg Chest 2 View  07/12/2015  CLINICAL DATA:  30 year old female with chest pressure EXAM: CHEST  2 VIEW COMPARISON:  Radiograph dated 04/11/2015 FINDINGS: There is a patchy area of airspace opacity involving the left lower lobe most compatible with developing pneumonia. Clinical correlation and follow-up recommended. No significant pleural effusion. No pneumothorax. Stable cardiac silhouette. The osseous structures appear unremarkable. IMPRESSION: Left lower lobe airspace opacity concerning for pneumonia. Electronically Signed   By: Elgie Collard M.D.   On: 07/12/2015 01:07    EKG: Independently reviewed. normal sinus rhythm   Assessment/Plan Principal Problem:   Acute respiratory failure with hypoxia (HCC) Active Problems:   INTERSTITIAL LUNG DISEASE   Rheumatoid arthritis (HCC)   Obesity, Class III, BMI 40-49.9 (morbid obesity) (HCC)   Essential hypertension, benign   Moderate obstructive sleep apnea    Chest pain   Chronic pain syndrome   Disseminated lupus erythematosus (HCC)   History of pulmonary embolism   CAP (community acquired pneumonia)   1. Acute respiratory failure with hypoxia. Likely related to early pneumonia and mild asthma exacerbation in the setting of interstitial lung disease. Chest x-ray consistent with developing pneumonia in left lower lobe, and saturation level 88% on room air scattered wheezes and crackles on exam. -Admit to telemetry  -Azithromycin and Rocephin  -Scheduled nebulizers  -Solu-Medrol 60 mg every 6 hours  -Flutter valve  -Oxygen supplementation  -May need to go home on oxygen not clear if baseline status changing . Ambulate   #2. Chest pain. Resolved admission. Likely related to above Initial troponin negative. EKG is normal sinus rhythm  -Cycle troponins  -Repeat EKG in the a.m.  -Monitor on telemetry  -Nitroglycerin as needed   #3. CAP. Per chest x-ray see #1. Leukocytosis. She is afebrile nontoxic appearing -Strep pneumo urine antigen  -legionella urine antigen  -influenza panel -  Gentle IV fluids   #4. Asthma exacerbation in setting of interstitial lung disease. See #1 -Scheduled nebulizers  -Solu-Medrol as noted above  -flutter valve -will need close OP follow up  #5. Lupus/chronic pain/RA. Appears stable at baseline. Of note patient reports he unable to refill medication prescriptions -Continue home meds.  -Case management   #6. History of PE. Has not had Xarelto for many months -resume Zarelto   Code Status: full DVT Prophylaxis: Family Communication:  Disposition Plan: home when ready  Time spent: 45 minutes  [facility] Pager [phone]

## 2015-07-12 NOTE — Progress Notes (Signed)
UR completed 

## 2015-07-12 NOTE — ED Notes (Signed)
MD at bedside. 

## 2015-07-12 NOTE — ED Notes (Signed)
Patient has returned from using the bathroom; patient placed back on monitor, continuous pulse oximetry, blood pressure cuff and oxygen [place] (2L); visitor at bedside

## 2015-07-12 NOTE — Plan of Care (Signed)
30 year old female with known history of asthma, PE, rheumatoid arthritis and lupus presents with complaints of shortness of breath and wheezing. Chest x-ray shows infiltrates. Despite getting multiple doses of nebulizer patient is still wheezing and has been admitted for pneumonia and asthma exacerbation.  Donna Hall.

## 2015-07-12 NOTE — Hospital Discharge Follow-Up (Signed)
The patient has been followed at the [facility] in the past.   Will follow her hospitalization and discuss plans for follow up after discharge.

## 2015-07-12 NOTE — Care Management Note (Addendum)
Case Management Note  Patient Details  Name: Donna Hall MRN: [id number] Date of Birth: 14-Feb-1986  Subjective/Objective:      Pt admitted with ARF adn CAP              Action/Plan:  Pt is independent from home with husband and small children.  Pt is active with [facility] and [facility] pt gets medications from pharmacy, pt denied hardship or issue with getting prescribed medications from pharmacy.  Assessment facilitated by spanish speaking resource.  Pt is active with [facility] for home O2, CM contacted agency for portable O2 for transport home.  CM will continue to follow for disposition needs   Expected Discharge Date:                  Expected Discharge Plan:  Home/Self Care  In-House Referral:     Discharge planning Services  CM Consult  Post Acute Care Choice:    Choice offered to:     DME Arranged:    DME Agency:     HH Arranged:    HH Agency:     Status of Service:  In process, will continue to follow  Medicare Important Message Given:    Date Medicare IM Given:    Medicare IM give by:    Date Additional Medicare IM Given:    Additional Medicare Important Message give by:     If discussed at Long Length of Stay Meetings, dates discussed:    Additional Comments:  Cherylann Parr, RN 07/12/2015, 4:35 PM

## 2015-07-12 NOTE — Progress Notes (Signed)
[facility] ED AM CM attempted to speak with pt after being transferred by [facility] ED unit secretary, Neysa Bonito but pt states she does not speak english and no english visitor present  CM faxed over spanish translated resources for uninsured [facility] ED pt to be given to her prior to new room assignment ATTN [id number] RN

## 2015-07-12 NOTE — Progress Notes (Signed)
[facility] CM (via text) updated on pt admission order for acute respiratory failure and CM consult for medication needs

## 2015-07-12 NOTE — ED Notes (Signed)
Husband at bedsside

## 2015-07-13 ENCOUNTER — Telehealth: Payer: Self-pay

## 2015-07-13 DIAGNOSIS — J45901 Unspecified asthma with (acute) exacerbation: Secondary | ICD-10-CM

## 2015-07-13 LAB — BASIC METABOLIC PANEL
Anion gap: 6 (ref 5–15)
BUN: 8 mg/dL (ref 6–20)
CALCIUM: 8.9 mg/dL (ref 8.9–10.3)
CO2: 27 mmol/L (ref 22–32)
CREATININE: 0.41 mg/dL — AB (ref 0.44–1.00)
Chloride: 106 mmol/L (ref 101–111)
GFR calc non Af Amer: >60 mL/min (ref >60)
Glucose, Bld: 132 mg/dL — ABNORMAL HIGH (ref 65–99)
Potassium: 4.6 mmol/L (ref 3.5–5.1)
SODIUM: 139 mmol/L (ref 135–145)

## 2015-07-13 LAB — STREP PNEUMONIAE URINARY ANTIGEN: Strep Pneumo Urinary Antigen: NEGATIVE

## 2015-07-13 LAB — CBC
HCT: 37.6 % (ref 36.0–46.0)
Hemoglobin: 10.9 g/dL — ABNORMAL LOW (ref 12.0–15.0)
MCH: 23.8 pg — ABNORMAL LOW (ref 26.0–34.0)
MCHC: 29 g/dL — ABNORMAL LOW (ref 30.0–36.0)
MCV: 82.1 fL (ref 78.0–100.0)
PLATELETS: 324 10*3/uL (ref 150–400)
RBC: 4.58 MIL/uL (ref 3.87–5.11)
RDW: 16.8 % — ABNORMAL HIGH (ref 11.5–15.5)
WBC: 5.8 10*3/uL (ref 4.0–10.5)

## 2015-07-13 LAB — HIV ANTIBODY (ROUTINE TESTING W REFLEX): HIV SCREEN 4TH GENERATION: NONREACTIVE

## 2015-07-13 LAB — INFLUENZA PANEL BY PCR (TYPE A & B)
H1N1FLUPCR: NOT DETECTED
INFLAPCR: NEGATIVE
INFLBPCR: NEGATIVE

## 2015-07-13 MED ORDER — GUAIFENESIN ER 600 MG PO TB12
1200.0000 mg | ORAL_TABLET | Freq: Two times a day (BID) | ORAL | Status: DC
  Administered 2015-07-13 – 2015-07-14 (×3): 1200 mg via ORAL
  Filled 2015-07-13 (×3): qty 2

## 2015-07-13 MED ORDER — METOCLOPRAMIDE HCL 5 MG/ML IJ SOLN
10.0000 mg | Freq: Once | INTRAMUSCULAR | Status: AC
  Administered 2015-07-13: 10 mg via INTRAVENOUS
  Filled 2015-07-13: qty 2

## 2015-07-13 MED ORDER — MORPHINE SULFATE (PF) 2 MG/ML IV SOLN: 2.0000 mg | INTRAVENOUS | Status: DC | PRN

## 2015-07-13 MED ORDER — OXYCODONE HCL 5 MG PO TABS
10.0000 mg | ORAL_TABLET | Freq: Once | ORAL | Status: AC
  Administered 2015-07-13: 10 mg via ORAL
  Filled 2015-07-13: qty 2

## 2015-07-13 MED ORDER — ARFORMOTEROL TARTRATE 15 MCG/2ML IN NEBU
15.0000 ug | INHALATION_SOLUTION | Freq: Two times a day (BID) | RESPIRATORY_TRACT | Status: DC
  Administered 2015-07-13 – 2015-07-14 (×3): 15 ug via RESPIRATORY_TRACT
  Filled 2015-07-13 (×3): qty 2

## 2015-07-13 MED ORDER — LEVALBUTEROL HCL 0.63 MG/3ML IN NEBU: 0.6300 mg | INHALATION_SOLUTION | RESPIRATORY_TRACT | Status: DC | PRN

## 2015-07-13 MED ORDER — LEVALBUTEROL HCL 0.63 MG/3ML IN NEBU
0.6300 mg | INHALATION_SOLUTION | Freq: Four times a day (QID) | RESPIRATORY_TRACT | Status: DC
  Administered 2015-07-13 – 2015-07-14 (×4): 0.63 mg via RESPIRATORY_TRACT
  Filled 2015-07-13 (×4): qty 3

## 2015-07-13 MED ORDER — BUDESONIDE 0.25 MG/2ML IN SUSP
0.2500 mg | Freq: Two times a day (BID) | RESPIRATORY_TRACT | Status: DC
  Administered 2015-07-13 – 2015-07-14 (×3): 0.25 mg via RESPIRATORY_TRACT
  Filled 2015-07-13 (×3): qty 2

## 2015-07-13 MED ORDER — METHYLPREDNISOLONE SODIUM SUCC 125 MG IJ SOLR
60.0000 mg | Freq: Three times a day (TID) | INTRAMUSCULAR | Status: DC
  Administered 2015-07-13 – 2015-07-14 (×2): 60 mg via INTRAVENOUS
  Filled 2015-07-13 (×2): qty 2

## 2015-07-13 NOTE — Progress Notes (Signed)
CSW received call from [facility] and from community health RN concerning pt.  Pt had reported to them that her child who lives back in [place] needed to change guardianship from the pt sister to the pt mom and the pt needed to provide the legal authorities with a letter giving permission for the pt mom to take over caregiving for the patient.  Pt was wanting assistance in completing this.  CSW called in person interpreter services to set up meeting for tomorrow to assess what can be done- meeting set for 12pm  CSW will continue to follow  Merlyn Lot, [facility] Clinical Social Worker [phone]

## 2015-07-13 NOTE — Evaluation (Signed)
Physical Therapy Evaluation Patient Details Name: Donna Hall MRN: [id number] DOB: 11/12/1985 Today's Date: 07/13/2015   History of Present Illness  Aalaiyah Yassin Chilel is a 30 y.o. female with a Past Medical History of psoriasis, lupus, HTN, pnsumonia, PE/DVT, OSA who presents with ARF and CAP  Clinical Impression  Pt admitted with/for ARF abnd CAP.  Pt currently limited functionally due to the problems listed below.  (see problems list.)  Pt will benefit from PT to maximize function and safety to be able to get home safely with available assist of family.     Follow Up Recommendations No PT follow up    Equipment Recommendations       Recommendations for Other Services       Precautions / Restrictions Precautions Precautions: Fall      Mobility  Bed Mobility Overal bed mobility: Needs Assistance Bed Mobility: Rolling;Sidelying to Sit Rolling: Supervision Sidelying to sit: Supervision (use of rail)          Transfers Overall transfer level: Needs assistance   Transfers: Sit to/from Stand Sit to Stand: Supervision            Ambulation/Gait Ambulation/Gait assistance: Supervision Ambulation Distance (Feet): 200 Feet (up in bathroom independently) Assistive device: None     Gait velocity interpretation: Below normal speed for age/gender General Gait Details: slow and painful  Stairs            Wheelchair Mobility    Modified Rankin (Stroke Patients Only)       Balance Overall balance assessment: Needs assistance Sitting-balance support: No upper extremity supported Sitting balance-Leahy Scale: Good     Standing balance support: No upper extremity supported Standing balance-Leahy Scale: Fair                               Pertinent Vitals/Pain Pain Assessment: Faces Faces Pain Scale: Hurts worst Pain Location: head, feet, total body Pain Descriptors / Indicators: Aching;Discomfort;Grimacing;Pounding Pain  Intervention(s): Monitored during session;Patient requesting pain meds-RN notified    Home Living Family/patient expects to be discharged to:: Private residence Living Arrangements: Spouse/significant other;Children Available Help at Discharge: Family;Other (Comment) Type of Home: Apartment Home Access: Level entry     Home Layout: One level Home Equipment: Walker - 2 wheels      Prior Function Level of Independence: Independent               Hand Dominance        Extremity/Trunk Assessment   Upper Extremity Assessment: Generalized weakness           Lower Extremity Assessment: Generalized weakness         Communication   Communication: Prefers language other than English;Interpreter utilized;Other (comment)  Cognition Arousal/Alertness: Awake/alert Behavior During Therapy: WFL for tasks assessed/performed Overall Cognitive Status: Within Functional Limits for tasks assessed                      General Comments General comments (skin integrity, edema, etc.): sats on 2L during gait 89/90%,  EHR 100's.  At rest 95% on 2L     Exercises        Assessment/Plan    PT Assessment Patient needs continued PT services  PT Diagnosis Difficulty walking;Generalized weakness;Acute pain   PT Problem List Decreased strength;Decreased activity tolerance;Decreased mobility;Pain  PT Treatment Interventions Gait training;Stair training;Functional mobility training;Therapeutic exercise;Therapeutic activities;Patient/family education   PT Goals (Current goals can be  found in the Care Plan section) Acute Rehab PT Goals Patient Stated Goal: Feel better PT Goal Formulation: With patient Time For Goal Achievement: 07/27/15 Potential to Achieve Goals: Good    Frequency Min 3X/week   Barriers to discharge        Co-evaluation               End of Session   Activity Tolerance: Patient tolerated treatment well;Patient limited by pain Patient left: in  bed;with call bell/phone within reach Nurse Communication: Mobility status         Time: 5284-1324 PT Time Calculation (min) (ACUTE ONLY): 28 min   Charges:   PT Evaluation $PT Eval Moderate Complexity: 1 Procedure PT Treatments $Gait Training: 8-22 mins   PT G Codes:        Yajayra Feldt, Eliseo Gum 07/13/2015, 7:00 PM  07/13/2015  Roy Bing, PT [phone]  (pager)
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
## 2015-07-13 NOTE — Progress Notes (Signed)
Nutrition Consult/Brief Note  RD consulted for assessment of nutrition requirements/status.  Wt Readings from Last 15 Encounters:  07/13/15 287 lb (130.182 kg)  05/18/15 287 lb (130.182 kg)  05/03/15 290 lb (131.543 kg)  04/09/15 291 lb (131.997 kg)  04/07/15 294 lb (133.358 kg)  04/03/15 294 lb 8 oz (133.584 kg)  03/02/15 297 lb (134.718 kg)  02/05/15 299 lb 9.6 oz (135.898 kg)  11/19/14 303 lb 6.4 oz (137.621 kg)  11/06/14 299 lb (135.626 kg)  11/03/14 310 lb 14.4 oz (141.023 kg)  10/26/14 304 lb (137.893 kg)  09/27/14 305 lb (138.347 kg)  09/18/14 314 lb (142.429 kg)  09/14/14 315 lb 4.1 oz (143 kg)    Estimated Nutritional Needs: Kcal: 2100-2300 Protein: 105-115 gm Fluid: 2.1-2.3 L  Body mass index is 50.85 kg/(m^2). Patient meets criteria for Obesity Class III based on current BMI.   Current diet order is Heart Healthy, patient is consuming approximately 100% of meals at this time. Labs and medications reviewed.   No nutrition interventions warranted at this time. If nutrition issues arise, please re-consult RD.   Maureen Chatters, RD, LDN Pager #: [phone] After-Hours Pager #: [phone]

## 2015-07-13 NOTE — Progress Notes (Signed)
[facility] PROGRESS NOTE  Donna Hall Chilel IEP:[id number] DOB: [birth year] DOA: 07/12/2015 PCP: Lora Paula, MD  Assessment/Plan: #1 acute respiratory failure with hypoxia Likely multifactorial secondary to left lower lobe community-acquired pneumonia noted on chest x-ray and mild asthma exacerbation in the setting of interstitial lung disease. Patient with clinical improvement. Continue empiric antibiotics of Rocephin and azithromycin. Continue scheduled nebs. Add Mucinex. Add Pulmicort and Brovana. Decrease IV Solu-Medrol to 60 mg every 8 hours. Patient states she is on 2 L home O2 will monitor for now. Follow.  #2 left lower lobe community-acquired pneumonia Sputum Gram stain and culture pending. Urine Legionella antigen pending urine pneumococcus antigen pending. Continue empiric Rocephin and azithromycin. Continue nebulizers. Add Mucinex. Follow.  #3 chest pain Likely secondary to problem #2. Patient currently chest pain-free. Troponin negative. Patient with no ischemic changes noted on EKG. Follow for now.  #4 asthma exacerbation in the setting of interstitial lung disease Continue nebulizers, IV steroid taper, pulmonary toilet. Add Mucinex and Pulmicort and Brovana. Follow.  #5 lupus/rheumatoid arthritis/chronic pain Currently stable. Continue home regimen.  #6 history of PE Continue xarelto  Code Status: Full Family Communication: Updated patient. No family at bedside. Disposition Plan: Home when medically stable hopefully in the next 24-48 hours.   Consultants: None  Procedures:  Chest x-ray 07/12/2015  Antibiotics:  Azithromycin 07/12/2015  IV Rocephin 07/12/2015  HPI/Subjective: Patient denies any chest pain. Patient states shortness of breath better. Patient states wheezing has improved.  Objective: Filed Vitals:   07/12/15 2001 07/13/15 0358  BP: 115/50 118/63  Pulse: 116 102  Temp: 98.9 F (37.2 C) 98.4 F (36.9 C)  Resp: 20 22     Intake/Output Summary (Last 24 hours) at 07/13/15 1038 Last data filed at 07/13/15 0730  Gross per 24 hour  Intake    480 ml  Output   2350 ml  Net  -1870 ml   There were no vitals filed for this visit.  Exam:   General: Obese. NAD  Cardiovascular: RRR  Respiratory: Fine diffuse crackles. Minimal wheezing.  Abdomen: Soft, nontender, nondistended, positive bowel sounds  Musculoskeletal: No clubbing cyanosis or edema.  Data Reviewed: Basic Metabolic Panel:  Recent Labs Lab 07/11/15 2338 07/13/15 0346  NA 137 139  K 4.2 4.6  CL 102 106  CO2 26 27  GLUCOSE 124* 132*  BUN 8 8  CREATININE 0.53 0.41*  CALCIUM 9.1 8.9   Liver Function Tests: No results for input(s): AST, ALT, ALKPHOS, BILITOT, PROT, ALBUMIN in the last 168 hours. No results for input(s): LIPASE, AMYLASE in the last 168 hours. No results for input(s): AMMONIA in the last 168 hours. CBC:  Recent Labs Lab 07/11/15 2338 07/13/15 0346  WBC 6.9 5.8  HGB 11.6* 10.9*  HCT 39.0 37.6  MCV 81.1 82.1  PLT 293 324   Cardiac Enzymes:  Recent Labs Lab 07/12/15 0731  TROPONINI <0.03   BNP (last 3 results)  Recent Labs  09/11/14 1407 11/19/14 1349  BNP 12.0 8.7    ProBNP (last 3 results) No results for input(s): PROBNP in the last 8760 hours.  CBG: No results for input(s): GLUCAP in the last 168 hours.  No results found for this or any previous visit (from the past 240 hour(s)).   Studies: Dg Chest 2 View  07/12/2015  CLINICAL DATA:  30 year old female with chest pressure EXAM: CHEST  2 VIEW COMPARISON:  Radiograph dated 04/11/2015 FINDINGS: There is a patchy area of airspace opacity involving the left lower  lobe most compatible with developing pneumonia. Clinical correlation and follow-up recommended. No significant pleural effusion. No pneumothorax. Stable cardiac silhouette. The osseous structures appear unremarkable. IMPRESSION: Left lower lobe airspace opacity concerning for  pneumonia. Electronically Signed   By: Elgie Collard M.D.   On: 07/12/2015 01:07    Scheduled Meds: . arformoterol  15 mcg Nebulization BID  . azaTHIOprine  250 mg Oral Daily  . azithromycin  500 mg Oral Daily  . budesonide (PULMICORT) nebulizer solution  0.25 mg Nebulization BID  . cefTRIAXone (ROCEPHIN)  IV  1 g Intravenous Q24H  . guaiFENesin  1,200 mg Oral BID  . hydroxychloroquine  200 mg Oral BID  . levalbuterol  0.63 mg Nebulization Q6H  . methylPREDNISolone (SOLU-MEDROL) injection  60 mg Intravenous Q6H   Continuous Infusions:   Principal Problem:   Acute respiratory failure with hypoxia (HCC) Active Problems:   INTERSTITIAL LUNG DISEASE   Rheumatoid arthritis (HCC)   Obesity, Class III, BMI 40-49.9 (morbid obesity) (HCC)   Essential hypertension, benign   Moderate obstructive sleep apnea   Chest pain   Chronic pain syndrome   Disseminated lupus erythematosus (HCC)   History of pulmonary embolism   CAP (community acquired pneumonia)   Acute respiratory failure (HCC)    Time spent: 40 mins    [facility] MD [facility] Pager [phone]. If 7PM-7AM, please contact night-coverage at [email or web address], password [facility] 07/13/2015, 10:38 AM  LOS: 1 day

## 2015-07-14 DIAGNOSIS — J9601 Acute respiratory failure with hypoxia: Secondary | ICD-10-CM

## 2015-07-14 LAB — BASIC METABOLIC PANEL
Anion gap: 5 (ref 5–15)
BUN: 16 mg/dL (ref 6–20)
CHLORIDE: 108 mmol/L (ref 101–111)
CO2: 26 mmol/L (ref 22–32)
Calcium: 8.6 mg/dL — ABNORMAL LOW (ref 8.9–10.3)
Creatinine, Ser: 0.47 mg/dL (ref 0.44–1.00)
GFR calc Af Amer: >60 mL/min (ref >60)
GFR calc non Af Amer: >60 mL/min (ref >60)
GLUCOSE: 142 mg/dL — AB (ref 65–99)
POTASSIUM: 4.4 mmol/L (ref 3.5–5.1)
Sodium: 139 mmol/L (ref 135–145)

## 2015-07-14 LAB — CBC
HEMATOCRIT: 35.9 % — AB (ref 36.0–46.0)
Hemoglobin: 10.5 g/dL — ABNORMAL LOW (ref 12.0–15.0)
MCH: 24.3 pg — AB (ref 26.0–34.0)
MCHC: 29.2 g/dL — ABNORMAL LOW (ref 30.0–36.0)
MCV: 83.1 fL (ref 78.0–100.0)
Platelets: 310 10*3/uL (ref 150–400)
RBC: 4.32 MIL/uL (ref 3.87–5.11)
RDW: 16.9 % — ABNORMAL HIGH (ref 11.5–15.5)
WBC: 6.5 10*3/uL (ref 4.0–10.5)

## 2015-07-14 LAB — LEGIONELLA ANTIGEN, URINE

## 2015-07-14 MED ORDER — GUAIFENESIN ER 600 MG PO TB12: 1200.0000 mg | ORAL_TABLET | Freq: Two times a day (BID) | ORAL | Status: DC

## 2015-07-14 MED ORDER — AZITHROMYCIN 500 MG PO TABS: 500.0000 mg | ORAL_TABLET | Freq: Every day | ORAL | Status: DC

## 2015-07-14 MED ORDER — CEFDINIR 300 MG PO CAPS: 300.0000 mg | ORAL_CAPSULE | Freq: Two times a day (BID) | ORAL | Status: DC

## 2015-07-14 MED ORDER — PREDNISONE 10 MG PO TABS: ORAL_TABLET | ORAL | Status: DC

## 2015-07-14 MED FILL — CEFDINIR 300 MG CAPSULE: 300 | 7 days supply | Qty: 14 | Fill #0

## 2015-07-14 MED FILL — ?AZITHROMYCIN 500 MG TABLET: 500 MG | 7 days supply | Qty: 7 | Fill #0

## 2015-07-14 MED FILL — ?PREDNISONE 10 MG TABLET: 10 | 35 days supply | Qty: 120 | Fill #0

## 2015-07-14 NOTE — Progress Notes (Signed)
Physical Therapy Treatment Patient Details Name: Kassity Woodson MRN: [id number] DOB: 05/13/1986 Today's Date: 07/14/2015    History of Present Illness Savi Lastinger Chilel is a 30 y.o. female with a Past Medical History of psoriasis, lupus, HTN, pnsumonia, PE/DVT, OSA who presents with ARF and CAP    PT Comments    Pt is likely approaching baseline functioning with a "normal" amount of pain for her.  She has a good affect today.  Expect pt to d/c today.   Follow Up Recommendations  No PT follow up     Equipment Recommendations  None recommended by PT    Recommendations for Other Services       Precautions / Restrictions      Mobility  Bed Mobility Overal bed mobility: Modified Independent   Rolling: Supervision            Transfers Overall transfer level: Modified independent                  Ambulation/Gait Ambulation/Gait assistance: Supervision Ambulation Distance (Feet): 400 Feet Assistive device: None Gait Pattern/deviations: WFL(Within Functional Limits)   Gait velocity interpretation: at or above normal speed for age/gender General Gait Details: gait more normalized and less painful looking.   Stairs Stairs: Yes Stairs assistance: Modified independent (Device/Increase time) Stair Management: One rail Right;Alternating pattern;Forwards Number of Stairs: 3 General stair comments: safe with rail  Wheelchair Mobility    Modified Rankin (Stroke Patients Only)       Balance     Sitting balance-Leahy Scale: Good       Standing balance-Leahy Scale: Good                      Cognition Arousal/Alertness: Awake/alert Behavior During Therapy: WFL for tasks assessed/performed Overall Cognitive Status: Within Functional Limits for tasks assessed                      Exercises      General Comments        Pertinent Vitals/Pain Pain Assessment: Faces Faces Pain Scale: Hurts a little bit Pain Location:  joints Pain Descriptors / Indicators: Discomfort Pain Intervention(s): Monitored during session    Home Living                      Prior Function            PT Goals (current goals can now be found in the care plan section) Acute Rehab PT Goals Patient Stated Goal: Feel better PT Goal Formulation: With patient Time For Goal Achievement: 07/27/15 Potential to Achieve Goals: Good Progress towards PT goals: Progressing toward goals    Frequency  Min 3X/week    PT Plan Current plan remains appropriate    Co-evaluation             End of Session   Activity Tolerance: Patient tolerated treatment well;Patient limited by pain Patient left: in chair;with call bell/phone within reach     Time: 1116-1130 PT Time Calculation (min) (ACUTE ONLY): 14 min  Charges:  $Gait Training: 8-22 mins                    G Codes:      Marybelle Giraldo, Eliseo Gum 07/14/2015, 11:42 AM 07/14/2015  [place] Bing, PT [phone]  (pager)

## 2015-07-14 NOTE — Progress Notes (Signed)
Interpreter Wyvonnia Dusky for [place] CSW and [employer]

## 2015-07-14 NOTE — Progress Notes (Signed)
Pt ambulated 50 feet with 2L of O2 and 50 feet without 2L of O2 while talking on her cell phone. Pt HR remained below 105. Pt denies SOB or dizziness. Pt O2 sat with oxygen remained above 98%. Without oxygen pt oxygen saturation dropped to 92%. Pt tolerated ambulation well.   Leonidas Romberg, RN

## 2015-07-14 NOTE — Care Management Note (Addendum)
Case Management Note  Patient Details  Name: Ravina Milner MRN: [id number] Date of Birth: 1985/09/28  Subjective/Objective:      Pt admitted with ARF adn CAP              Action/Plan:  Pt is independent from home with husband and small children.  Pt is active with [facility] and [facility] pt gets medications from pharmacy, pt denied hardship or issue with getting prescribed medications from pharmacy.  Assessment facilitated by spanish speaking resource.  Pt is active with [facility] for home O2, CM contacted agency for portable O2 for transport home.  CM will continue to follow for disposition needs   Expected Discharge Date:                  Expected Discharge Plan:  Home/Self Care  In-House Referral:     Discharge planning Services  CM Consult  Post Acute Care Choice:    Choice offered to:  Patient  DME Arranged:  3-N-1, Oxygen DME Agency:  [facility]  HH Arranged:    HH Agency:     Status of Service:  Complete, will sign off  Medicare Important Message Given:    Date Medicare IM Given:    Medicare IM give by:    Date Additional Medicare IM Given:    Additional Medicare Important Message give by:     If discussed at Long Length of Stay Meetings, dates discussed:    Additional Comments: CM offered choice, [facility] chosen for 3:1, agency contacted and referral accepted, services will provided per charity  .  CM requested that [facility] provide portable oxygen tank for pt to transport home.   Cherylann Parr, RN 07/14/2015, 9:59 AM

## 2015-07-14 NOTE — Progress Notes (Signed)
Pt requested to be wheeled to the 2nd floor 'employee' entrance on the east side of the hospital. Wheeled pt to requested entrance. 2nd RN brought along 3 in 1. Upon arriving at the entrance pt informed pt that her husband would be there in 20 minutes and just to let her wait. Informed pt that we could not leave her by herself. Upon further conversation discovered husband had gone to pick the kids up at school and left the car with the keys. Pt was insistent upon driving herself home. Pt had no narcotics in her system and was completely alert and oriented. Pt informed staff that she regularly drives with her oxygen by putting it in the passenger seat. Pt continued to insist that she drive herself home. Staff safely secured pt in her vehicle with her oxygen and 3 in 1. Staff viewed pt leaving the parking lot safely.   Leonidas Romberg, RN

## 2015-07-14 NOTE — Discharge Summary (Signed)
Physician Discharge Summary  Donna Hall MRN: [id number] DOB/AGE: [birth year] 31 y.o.  PCP: Minerva Ends, MD   Admit date: 07/12/2015 Discharge date: 07/14/2015  Discharge Diagnoses:     Principal Problem:   Acute respiratory failure with hypoxia ([place]) Active Problems:   INTERSTITIAL LUNG DISEASE   Rheumatoid arthritis (HCC)   Obesity, Class III, BMI 40-49.9 (morbid obesity) ([place])   Essential hypertension, benign   Moderate obstructive sleep apnea   Chest pain   Chronic pain syndrome   Disseminated lupus erythematosus ([place])   History of pulmonary embolism   CAP (community acquired pneumonia)   Acute respiratory failure ([place])   Asthma attack    Follow-up recommendations Follow-up with PCP in 3-5 days , including all  additional recommended appointments as below Follow-up CBC, CMP in 3-5 days       Medication List    STOP taking these medications        amoxicillin 500 MG capsule  Commonly known as:  AMOXIL      TAKE these medications        acetaminophen-codeine 300-30 MG tablet  Commonly known as:  TYLENOL #3  Take 1 tablet by mouth every 8 (eight) hours as needed for moderate pain.     albuterol 108 (90 Base) MCG/ACT inhaler  Commonly known as:  PROVENTIL HFA;VENTOLIN HFA  Inhale 2 puffs into the lungs every 4 (four) hours as needed for wheezing or shortness of breath.     azaTHIOprine 50 MG tablet  Commonly known as:  IMURAN  Take 5 tablets (250 mg total) by mouth daily.     azithromycin 500 MG tablet  Commonly known as:  ZITHROMAX  Take 1 tablet (500 mg total) by mouth daily.     cefdinir 300 MG capsule  Commonly known as:  OMNICEF  Take 1 capsule (300 mg total) by mouth 2 (two) times daily.     CONDYLOX 0.5 % gel  Generic drug:  podofilox  Apply topically 2 (two) times daily.     guaiFENesin 600 MG 12 hr tablet  Commonly known as:  MUCINEX  Take 2 tablets (1,200 mg total) by mouth 2 (two) times daily.     hydroxychloroquine  200 MG tablet  Commonly known as:  PLAQUENIL  Take 1 tablet (200 mg total) by mouth 2 (two) times daily.     hydrOXYzine 10 MG tablet  Commonly known as:  ATARAX/VISTARIL  Take 1 tablet (10 mg total) by mouth every 8 (eight) hours as needed for itching.     ipratropium 0.02 % nebulizer solution  Commonly known as:  ATROVENT  Take 2.5 mLs (0.5 mg total) by nebulization once.     lidocaine 5 % ointment  Commonly known as:  XYLOCAINE  Apply 1 application topically as needed.     mometasone-formoterol 100-5 MCG/ACT Aero  Commonly known as:  DULERA  Take 2 puffs first thing in am and then another 2 puffs about 12 hours later.     predniSONE 10 MG tablet  Commonly known as:  DELTASONE  6 tabs, 5 days, 5 tabs, 5 days,4tabs , 5 days, 3 tabs, 5 days, 2 tabs, 5 days 1 tab , 5 days, 1/2 tab  5 days     rivaroxaban 20 MG Tabs tablet  Commonly known as:  XARELTO  Take 1 tablet (20 mg total) by mouth daily with supper.     traMADol 50 MG tablet  Commonly known as:  ULTRAM  Take 1 tablet (50  mg total) by mouth every 8 (eight) hours as needed.         Discharge Condition: Stable  Discharge Instructions       Discharge Instructions    Diet - low sodium heart healthy    Complete by:  As directed      Increase activity slowly    Complete by:  As directed            No Known Allergies    Disposition: 01-Home or Self Care   Consults: None    Significant Diagnostic Studies:  Dg Chest 2 View  07/12/2015  CLINICAL DATA:  30 year old female with chest pressure EXAM: CHEST  2 VIEW COMPARISON:  Radiograph dated 04/11/2015 FINDINGS: There is a patchy area of airspace opacity involving the left lower lobe most compatible with developing pneumonia. Clinical correlation and follow-up recommended. No significant pleural effusion. No pneumothorax. Stable cardiac silhouette. The osseous structures appear unremarkable. IMPRESSION: Left lower lobe airspace opacity concerning for  pneumonia. Electronically Signed   By: Anner Crete M.D.   On: 07/12/2015 01:07        Filed Weights   07/13/15 1312  Weight: 130.182 kg (287 lb)     Microbiology: Recent Results (from the past 240 hour(s))  Culture, blood (routine x 2) Call MD if unable to obtain prior to antibiotics being given     Status: None (Preliminary result)   Collection Time: 07/12/15  9:00 AM  Result Value Ref Range Status   Specimen Description BLOOD RIGHT ANTECUBITAL  Final   Special Requests BOTTLES DRAWN AEROBIC AND ANAEROBIC 5CC  Final   Culture NO GROWTH 2 DAYS  Final   Report Status PENDING  Incomplete  Culture, blood (routine x 2) Call MD if unable to obtain prior to antibiotics being given     Status: None (Preliminary result)   Collection Time: 07/12/15  9:04 AM  Result Value Ref Range Status   Specimen Description BLOOD RIGHT HAND  Final   Special Requests BOTTLES DRAWN AEROBIC AND ANAEROBIC 5CC  Final   Culture NO GROWTH 2 DAYS  Final   Report Status PENDING  Incomplete       Blood Culture    Component Value Date/Time   SDES BLOOD RIGHT HAND 07/12/2015 0904   SPECREQUEST BOTTLES DRAWN AEROBIC AND ANAEROBIC 5CC 07/12/2015 0904   CULT NO GROWTH 2 DAYS 07/12/2015 0904   REPTSTATUS PENDING 07/12/2015 9528      Labs: Results for orders placed or performed during the hospital encounter of 07/12/15 (from the past 48 hour(s))  HIV antibody     Status: None   Collection Time: 07/12/15 11:37 AM  Result Value Ref Range   HIV Screen 4th Generation wRfx Non Reactive Non Reactive    Comment: (NOTE) Performed At: [facility] [place], [place] [id number] Lindon Romp MD UV:[id number]   Basic metabolic panel     Status: Abnormal   Collection Time: 07/13/15  3:46 AM  Result Value Ref Range   Sodium 139 135 - 145 mmol/L   Potassium 4.6 3.5 - 5.1 mmol/L   Chloride 106 101 - 111 mmol/L   CO2 27 22 - 32 mmol/L   Glucose, Bld 132 (H) 65 - 99 mg/dL   BUN 8 6  - 20 mg/dL   Creatinine, Ser 0.41 (L) 0.44 - 1.00 mg/dL   Calcium 8.9 8.9 - 10.3 mg/dL   GFR calc non Af Amer >60 >60 mL/min   GFR calc Af  Amer >60 >60 mL/min    Comment: (NOTE) The eGFR has been calculated using the CKD EPI equation. This calculation has not been validated in all clinical situations. eGFR's persistently <60 mL/min signify possible Chronic Kidney Disease.    Anion gap 6 5 - 15  CBC     Status: Abnormal   Collection Time: 07/13/15  3:46 AM  Result Value Ref Range   WBC 5.8 4.0 - 10.5 K/uL   RBC 4.58 3.87 - 5.11 MIL/uL   Hemoglobin 10.9 (L) 12.0 - 15.0 g/dL   HCT 37.6 36.0 - 46.0 %   MCV 82.1 78.0 - 100.0 fL   MCH 23.8 (L) 26.0 - 34.0 pg   MCHC 29.0 (L) 30.0 - 36.0 g/dL   RDW 16.8 (H) 11.5 - 15.5 %   Platelets 324 150 - 400 K/uL  Influenza panel by PCR (type A & B, H1N1)     Status: None   Collection Time: 07/13/15  3:54 AM  Result Value Ref Range   Influenza A By PCR NEGATIVE NEGATIVE   Influenza B By PCR NEGATIVE NEGATIVE   H1N1 flu by pcr NOT DETECTED NOT DETECTED    Comment:        The Xpert Flu assay (FDA approved for nasal aspirates or washes and nasopharyngeal swab specimens), is intended as an aid in the diagnosis of influenza and should not be used as a sole basis for treatment.   Strep pneumoniae urinary antigen     Status: None   Collection Time: 07/13/15  3:58 AM  Result Value Ref Range   Strep Pneumo Urinary Antigen NEGATIVE NEGATIVE    Comment:        Infection due to S. pneumoniae cannot be absolutely ruled out since the antigen present may be below the detection limit of the test.   CBC     Status: Abnormal   Collection Time: 07/14/15  4:12 AM  Result Value Ref Range   WBC 6.5 4.0 - 10.5 K/uL   RBC 4.32 3.87 - 5.11 MIL/uL   Hemoglobin 10.5 (L) 12.0 - 15.0 g/dL   HCT 35.9 (L) 36.0 - 46.0 %   MCV 83.1 78.0 - 100.0 fL   MCH 24.3 (L) 26.0 - 34.0 pg   MCHC 29.2 (L) 30.0 - 36.0 g/dL   RDW 16.9 (H) 11.5 - 15.5 %   Platelets 310 150 -  400 K/uL  Basic metabolic panel     Status: Abnormal   Collection Time: 07/14/15  4:12 AM  Result Value Ref Range   Sodium 139 135 - 145 mmol/L   Potassium 4.4 3.5 - 5.1 mmol/L   Chloride 108 101 - 111 mmol/L   CO2 26 22 - 32 mmol/L   Glucose, Bld 142 (H) 65 - 99 mg/dL   BUN 16 6 - 20 mg/dL   Creatinine, Ser 0.47 0.44 - 1.00 mg/dL   Calcium 8.6 (L) 8.9 - 10.3 mg/dL   GFR calc non Af Amer >60 >60 mL/min   GFR calc Af Amer >60 >60 mL/min    Comment: (NOTE) The eGFR has been calculated using the CKD EPI equation. This calculation has not been validated in all clinical situations. eGFR's persistently <60 mL/min signify possible Chronic Kidney Disease.    Anion gap 5 5 - 15     Lipid Panel     Component Value Date/Time   CHOL 123 03/08/2012 0552   TRIG 71 03/08/2012 0552   HDL 45 03/08/2012 0552   CHOLHDL  2.7 03/08/2012 0552   VLDL 14 03/08/2012 0552   LDLCALC 64 03/08/2012 0552     Lab Results  Component Value Date   HGBA1C 5.5 02/23/2014   HGBA1C 5.5 06/15/2013   HGBA1C 5.8* 03/07/2012     Lab Results  Component Value Date   MICROALBUR 2.47* 03/07/2012   LDLCALC 64 03/08/2012   CREATININE 0.47 07/14/2015     HPI : Donna Hall is a 30 y.o. female with a history of pulmonary embolism, rheumatoid arthritis, lupus connective tissue disorder, interstitial lung disease, pression anxiety morbid obesity presents to emergency department with the chief complaint of shortness of breath and chest pain. Initial evaluation in the emergency department reveals acute respiratory failure with hypoxia chest x-ray concerning for pneumonia.  Since speaks very little [place]. She reports 2-3 day history of gradual worsening shortness of breath wheezing cough. Associated symptoms include pressure-like chest pain on the left side headache nausea subjective fever and wheezes. She denies abdominal pain diarrhea dysuria hematuria frequency or urgency. He denies lower extremity  edema. Reports not having been able to refill most of her medications of late.  In the emergency department she is provided with azithromycin, Rocephin, Solu-Medrol and nebulizers.  HOSPITAL COURSE:  #1 acute respiratory failure with hypoxia Likely multifactorial secondary to left lower lobe community-acquired pneumonia noted on chest x-ray and mild asthma exacerbation in the setting of interstitial lung disease. Patient with clinical improvement. Started on empiric antibiotics of Rocephin and azithromycin. Continue Omnicef and azithromycin for another 7 days. Continue prednisone taper. Continue scheduled nebs. Continue Mucinex. Add Pulmicort and Brovana during this hospitalization. Received IV Solu-Medrol to 60 mg every 8 hours. Switched to prednisone taper, Patient states she is on 2 L home O2 will monitor for now. Continue home oxygen   #2 left lower lobe community-acquired pneumonia Sputum Gram stain and culture pending. Urine Legionella antigen pending urine pneumococcus antigen negative. Continue antibiotics as above,. Continue nebulizers. Add Mucinex. Follow.  #3 chest pain Likely secondary to problem #2. Patient currently chest pain-free. Troponin negative. Patient with no ischemic changes noted on EKG. Follow for now.  #4 asthma exacerbation in the setting of interstitial lung disease Treated with nebulizers, IV steroid taper, pulmonary toilet. Also received Mucinex and Pulmicort and Brovana. Follow.  #5 lupus/rheumatoid arthritis/chronic pain Currently stable. Continue home regimen.  #6 history of PE Continue xarelto   Discharge Exam: *   Blood pressure 92/37, pulse 54, temperature 98.2 F (36.8 C), temperature source Oral, resp. rate 18, weight 130.182 kg (287 lb), last menstrual period 06/27/2015, SpO2 98 %.   General: Obese. NAD  Cardiovascular: RRR  Respiratory: Fine diffuse crackles. Minimal wheezing.  Abdomen: Soft, nontender, nondistended, positive bowel  sounds  Musculoskeletal: No clubbing cyanosis or edema.    Follow-up Information    Follow up with [facility]. Go on 07/19/2015.   Specialty:  Internal Medicine   Why:  at 2:30 pm at the [facility] with Dr Jarold Song.    Contact information:   [address] [id number] [place] [phone]      Follow up with Minerva Ends, MD. Schedule an appointment as soon as possible for a visit in 3 days.   Specialty:  Family Medicine   Contact information:   [address] [place] [address] [phone]       Signed: Reyne Dumas 07/14/2015, 10:59 AM        Time spent >45 mins

## 2015-07-14 NOTE — Progress Notes (Signed)
Discussed discharge with the patient with interpretor in the room. Discussed follow up appointments, medications, and informed patient that prescriptions were at [facility]. Gave pt information for [employer] [facility] [place] to help point her to some potential resources.   Leonidas Romberg, RN

## 2015-07-15 ENCOUNTER — Telehealth: Payer: Self-pay

## 2015-07-15 NOTE — Telephone Encounter (Signed)
[facility] Post-discharge Follow-Up Phone Call:  Date of Discharge: 07/14/15 Principal Discharge Diagnosis(es): Acute respiratory failure with hypoxia Call Completed: Yes                    With Whom: Patient Interpreter Needed: Yes, used Interpreter [phone] and [phone] to complete call                Language/Dialect: Spanish     Please check all that apply:  X  Patient is knowledgeable of his/her condition(s) and/or treatment. X  Patient is caring for self at home.  ? Patient is receiving assist at home from family and/or caregiver. Family and/or caregiver is knowledgeable of patient's condition(s) and/or treatment. ? Patient is receiving home health services. If so, name of agency.     Medication Reconciliation:  X  Patient obtained all discharge medications-YES. Patient's discharge medications reviewed, and patient indicated she obtained all discharge medications. Patient indicated she needed refill of Plaquenil and indicated she had been out of medication for a month. Spoke with Dr. Venetia Night about Plaquenil, and she indicated medication would have to be refilled by patient's Rheumatologist, Dr. Glenard Haring.  Discussed with patient who verbalized understanding.    Activities of Daily Living:  X  Independent-Patient independent with daily activities. Patient on home oxygen. Oxygen supplier is [facility].  Patient also verified she was given portable oxygen at discharge as well as a bedside commode. ? Needs assist ? [facility]    Community resources in place for patient:  ? None  X  Home Health/Home DME-[facility] for oxygen ? Assisted Living ? Support Group               Questions/Concerns discussed: Inquired about patient's status; she indicated she was "doing well" and did not currently have any questions or health concerns. Patient reminded of her [facility] appointment on 07/19/15 at 1430 with Dr. Venetia Night. She verbalized understanding and  indicated she had transportation to her appointment.

## 2015-07-17 LAB — CULTURE, BLOOD (ROUTINE X 2)
CULTURE: NO GROWTH
Culture: NO GROWTH

## 2015-07-19 ENCOUNTER — Inpatient Hospital Stay: Payer: Self-pay | Admitting: Family Medicine

## 2015-07-19 NOTE — Telephone Encounter (Signed)
This call was started in error.  Please disregard.

## 2015-07-21 ENCOUNTER — Telehealth: Payer: Self-pay

## 2015-07-21 NOTE — Telephone Encounter (Signed)
Call placed to the patient with the assistance of Shannan Harper Interpreter # [phone] with [employer]. The patient stated that she missed her appointment on 07/19/15 because of a "couple of issues."  She stated that she had "pain in her bone" and she did not have a ride.  She said that she still needs her lupus medication and she knows that she needs to see her rheumatologist. Informed her to call the clinic if she needs a ride to an appointment and she stated that she would. She said that she is using her portable O2 when she goes out and it is " helping." her breathing,   She was agreeable to scheduling another appointment at the [facility] and an appointment was scheduled for 07/27/15 @ 1100.  She said that she appreciated the appointment and reported no other problems/questions.

## 2015-07-26 ENCOUNTER — Telehealth: Payer: Self-pay

## 2015-07-26 NOTE — Telephone Encounter (Signed)
Call placed to the patient to check on her status and to remind her of her appointment at the [facility] tomorrow, 07/27/15 @1100 .  Spanish interpreter # [phone] with [facility] [employer] assisted with the call.  The patient stated that she is doing " okay."  She noted that she had been using her portable oxygen when she leave the house until it ran out.  She did not know how to re-order it.  Instructed her that this CM would contact [facility]) to inquire about oxygen refills.  The patient noted that she has transportation to her appointment. Instructed her to contact the clinic if she is experiencing any difficulty with transportation tomorrow and she stated that she would.  Instructed her to bring all of her medications with her to her appointment tomorrow and she stated that she would. She reported no other problems/questions..  Call then placed to [facility] # [phone] and spoke to [phone].  Inquired about the status of O2 refills for the patient. She stated that the O2 is delivered on clients on assigned days. The patient needs to call the day prior to the delivery date to request refills and let them know how many tanks she needs. She stated that she would contact the patient and explain the process to her.

## 2015-07-27 ENCOUNTER — Encounter: Payer: Self-pay | Admitting: Clinical

## 2015-07-27 ENCOUNTER — Encounter: Payer: Self-pay | Admitting: Family Medicine

## 2015-07-27 ENCOUNTER — Ambulatory Visit: Payer: Self-pay | Attending: Family Medicine | Admitting: Family Medicine

## 2015-07-27 VITALS — BP 125/76 | HR 101 | Temp 98.7°F | Resp 16 | Ht 63.0 in | Wt 247.0 lb

## 2015-07-27 DIAGNOSIS — J45991 Cough variant asthma: Secondary | ICD-10-CM | POA: Insufficient documentation

## 2015-07-27 DIAGNOSIS — J189 Pneumonia, unspecified organism: Secondary | ICD-10-CM | POA: Insufficient documentation

## 2015-07-27 DIAGNOSIS — Z7952 Long term (current) use of systemic steroids: Secondary | ICD-10-CM | POA: Insufficient documentation

## 2015-07-27 DIAGNOSIS — Z7901 Long term (current) use of anticoagulants: Secondary | ICD-10-CM | POA: Insufficient documentation

## 2015-07-27 DIAGNOSIS — Z131 Encounter for screening for diabetes mellitus: Secondary | ICD-10-CM

## 2015-07-27 DIAGNOSIS — Z79899 Other long term (current) drug therapy: Secondary | ICD-10-CM | POA: Insufficient documentation

## 2015-07-27 DIAGNOSIS — Z86711 Personal history of pulmonary embolism: Secondary | ICD-10-CM | POA: Insufficient documentation

## 2015-07-27 DIAGNOSIS — J849 Interstitial pulmonary disease, unspecified: Secondary | ICD-10-CM | POA: Insufficient documentation

## 2015-07-27 DIAGNOSIS — M069 Rheumatoid arthritis, unspecified: Secondary | ICD-10-CM | POA: Insufficient documentation

## 2015-07-27 DIAGNOSIS — J8410 Pulmonary fibrosis, unspecified: Secondary | ICD-10-CM

## 2015-07-27 DIAGNOSIS — M329 Systemic lupus erythematosus, unspecified: Secondary | ICD-10-CM | POA: Insufficient documentation

## 2015-07-27 DIAGNOSIS — G894 Chronic pain syndrome: Secondary | ICD-10-CM | POA: Insufficient documentation

## 2015-07-27 DIAGNOSIS — J9601 Acute respiratory failure with hypoxia: Secondary | ICD-10-CM | POA: Insufficient documentation

## 2015-07-27 LAB — POCT GLYCOSYLATED HEMOGLOBIN (HGB A1C): Hemoglobin A1C: 5.6

## 2015-07-27 MED ORDER — TRAMADOL HCL 50 MG PO TABS: 50.0000 mg | ORAL_TABLET | Freq: Three times a day (TID) | ORAL | Status: DC | PRN

## 2015-07-27 MED FILL — traMADol HCL 50 MG TABS: 50 | 20 days supply | Qty: 60 | Fill #0

## 2015-07-27 NOTE — Patient Instructions (Signed)
Neumonía extrahospitalaria en los adultos °(Community-Acquired Pneumonia, Adult) °La neumonía es una infección en los pulmones. Hay diferentes tipos de neumonía. Uno de ellos puede desarrollarse mientras una persona está en el hospital. Un tipo diferente, llamado neumonía extrahospitalaria, evoluciona en las personas que no están o no han estado recientemente en el hospital u otro centro de salud.  °CAUSAS °La neumonía puede ser bacteriana, viral o micótica. Con frecuencia, la causa de la neumonía extrahospitalaria es la bacteria Streptococcus pneumoniae. Estas bacterias suelen transmitirse de una persona a otra al inhalar las gotitas que un individuo infectado libera al toser o estornudar. °FACTORES DE RIESGO °Es más probable que la enfermedad se manifieste en: °· Las personas que padecen enfermedades crónicas, como enfermedad pulmonar obstructiva crónica (EPOC), asma, insuficiencia cardíaca congestiva, fibroso quística, diabetes o enfermedad renal. °· Las personas que tienen el VIH en etapa inicial o avanzada. °· Las personas que tienen anemia drepanocítica. °· Las personas a las que se les extrajo el bazo (esplenectomía). °· Las personas cuya higiene dental es deficiente. °· Las personas que sufren enfermedades que aumentan el riesgo de inspirar (aspirar) las secreciones de la propia boca y la nariz.   °· Las personas cuyo sistema inmunitario está debilitado (inmunodeprimido). °· Los fumadores. °· Las personas que viajan a regiones donde es frecuente la existencia de los gérmenes que causan la neumonía. °· Las personas que tienen contacto con hábitat de animales o con animales que son portadores de los gérmenes que causan neumonía, entre ellos, pájaros, murciélagos, conejos, gatos y animales de granja. °SÍNTOMAS °Los síntomas de esta enfermedad incluyen lo siguiente: °· Tos seca. °· Tos con expectoración (productiva). °· Fiebre. °· Sudoración. °· Dolor de pecho, especialmente al respirar profundamente o al  toser. °· Respiración rápida o dificultad para respirar. °· Falta de aire. °· Escalofríos. °· Fatiga. °· Dolores musculares. °DIAGNÓSTICO °El médico le hará una historia clínica y un examen físico. También pueden hacerle otros estudios, por ejemplo: °· Estudios de diagnóstico por imágenes, entre ellos, radiografías. °· Análisis para controlar el nivel de oxígeno y de otros gases en la sangre. °· Otros análisis de sangre, de la mucosidad (esputo), el líquido que rodea los pulmones (líquido pleural) y la orina. °Si la neumonía es grave, se pueden hacer otros estudios para identificar la causa específica de la enfermedad. °TRATAMIENTO °El tipo de tratamiento que se administra depende de muchos factores, por ejemplo, la causa de la neumonía, los medicamentos que toma y otras enfermedades que padezca. En el caso de la mayoría de los adultos, el tratamiento de la neumonía y la recuperación pueden hacerse en la casa. En algunos casos, el tratamiento debe administrarse en un hospital. El tratamiento puede incluir lo siguiente: °· Antibióticos, si la neumonía fue causada por bacterias. °· Antivirales, si la neumonía fue causada por un virus. °· Medicamentos que se administran por vía oral o por vía intravenosa (IV). °· Oxígeno. °· Terapia respiratoria. °Aunque es poco frecuente, el tratamiento de la neumonía grave puede incluir lo siguiente: °· Asistencia respiratoria mecánica. Este tratamiento se realiza si no respira bien por sí solo y no puede mantener un nivel de oxígeno adecuado. °· Toracocentesis. Este procedimiento se realiza para extraer el líquido que rodea uno o ambos pulmones, a fin de mejorar la respiración. °INSTRUCCIONES PARA EL CUIDADO EN EL HOGAR °· Tome los medicamentos de venta libre y los recetados solamente como se lo haya indicado el médico. °· Tome los medicamentos para la tos solamente si no puede dormir bien. Debe entender que   los medicamentos para la tos pueden minar la capacidad natural del  organismo de eliminar la mucosidad de los pulmones. °· Si le recetaron un antibiótico, tómelo como se lo haya indicado el médico. No deje de tomar los antibióticos aunque comience a sentirse mejor. °· De noche, duerma semisentado. Intente dormir en un sillón reclinable o póngase algunas almohadas debajo de la cabeza. °· No consuma productos que contengan tabaco, incluidos cigarrillos, tabaco de mascar y cigarrillos electrónicos. Si necesita ayuda para dejar de fumar, consulte al médico. °· Beba suficiente agua para mantener la orina clara o de color amarillo pálido. Esto ayudará a fluidificar las secreciones mucosas de los pulmones. °PREVENCIÓN °Existen formas de reducir el riesgo de tener neumonía extrahospitalaria. Considere la posibilidad de aplicarse la vacuna antineumocócica reúne estos requisitos: °· Es mayor de 65 años. °· Es mayor de 19 años y está recibiendo tratamiento oncológico, tiene enfermedad pulmonar crónica u otros trastornos que le afectan el sistema inmunitario. Pregúntele al médico si esto es válido para su caso. °Hay diferentes tipos de vacunas antineumocócicas y cronogramas para su aplicación. Pregúntele al médico cuál es la opción de vacunación más adecuada para usted. °También puede evitar contraer neumonía extrahospitalaria si toma las siguientes medidas: °· Se aplica la vacuna antigripal todos los años. Pregúntele al médico cuál es el tipo de vacuna antigripal más adecuado para usted. °· Visita al dentista periódicamente. °· Se lava las manos con frecuencia. Utilice un desinfectante para manos si no dispone de agua y jabón. °SOLICITE ATENCIÓN MÉDICA SI: °· Tiene fiebre. °· No duerme bien porque no es posible controlar la tos con medicamentos para la tos. °SOLICITE ATENCIÓN MÉDICA DE INMEDIATO SI: °· Experimenta un empeoramiento en la falta de aire. °· El dolor de pecho es cada vez más intenso. °· La enfermedad empeora, especialmente si usted es un adulto mayor o su sistema inmunitario está  debilitado. °· Tose y escupe sangre. °  °Esta información no tiene como fin reemplazar el consejo del médico. Asegúrese de hacerle al médico cualquier pregunta que tenga. °  °Document Released: 03/22/2005 Document Revised: 03/03/2015 °Elsevier Interactive Patient Education ©2016 Elsevier Inc. ° ° °

## 2015-07-27 NOTE — Progress Notes (Signed)
Pt says she needs to apply for Medicaid, but does not know where to go to apply, she does not know the address, nor what to do when she goes. Pt given address, phone number, and bus number to get to [employer], and verbal instructions about what to do when she arrives at the building. Patient verbalized understanding, through a Spanish-speaking phone interpreter service.

## 2015-07-27 NOTE — Progress Notes (Signed)
TRANSITIONAL CARE CLINIC  Date of telephone encounter: 07/15/15  Admit date: 07/12/15 Discharge date: 07/14/15  PCP: Dr Armen Pickup Subjective:  Patient ID: Donna Hall, female    DOB: Jul 14, 1985  Age: 30 y.o. MRN: [id number]  CC: Follow-up   HPI Allysia Sibbett is a 30 year old female with a history of pulmonary embolism, rheumatoid arthritis, lupus (previously followed by rheumatology at [facility] but was lost to follow-up), interstitial lung disease (last seen by [facility] in 07/2014), cough variant asthma who was recently hospitalized for acute respiratory failure with hypoxia secondary to community-acquired pneumonia at [facility].  She had presented with worsening shortness of breath, was hypoxic and in acute respiratory failure; CXR was concerning for pneumonia. Urine Legionella was negative, H1 N1 negative, influenza negative. She was on oxygen supplementation, received IV Rocephin and azithromycin, nebulizer treatment as well as IV Solu-Medrol with resulting improvement in symptoms. She was subsequently discharged on a prednisone taper, azithromycin and Omnicef.  She has completed her course of Omnicef and has no respiratory complaints today but complains of generalized body aches from her chronic pain and is requesting a refill of tramadol. She also has not been taking Plaquenil for her lupus for a while despite my having several discussions with her at previous visits about her needing to follow-up with her rheumatologist at [facility].  Outpatient Prescriptions Prior to Visit  Medication Sig Dispense Refill  . albuterol (PROVENTIL HFA;VENTOLIN HFA) 108 (90 BASE) MCG/ACT inhaler Inhale 2 puffs into the lungs every 4 (four) hours as needed for wheezing or shortness of breath. 1 Inhaler 3  . azaTHIOprine (IMURAN) 50 MG tablet Take 5 tablets (250 mg total) by mouth daily. 150 tablet 3  . cefdinir (OMNICEF) 300 MG capsule Take 1 capsule (300 mg total) by  mouth 2 (two) times daily. 14 capsule 0  . hydrOXYzine (ATARAX/VISTARIL) 10 MG tablet Take 1 tablet (10 mg total) by mouth every 8 (eight) hours as needed for itching. 90 tablet 5  . mometasone-formoterol (DULERA) 100-5 MCG/ACT AERO Take 2 puffs first thing in am and then another 2 puffs about 12 hours later. 1 Inhaler 2  . predniSONE (DELTASONE) 10 MG tablet 6 tabs, 5 days, 5 tabs, 5 days,4tabs , 5 days, 3 tabs, 5 days, 2 tabs, 5 days 1 tab , 5 days, 1/2 tab  5 days 120 tablet 0  . traMADol (ULTRAM) 50 MG tablet Take 1 tablet (50 mg total) by mouth every 8 (eight) hours as needed. 60 tablet 2  . CONDYLOX 0.5 % gel Apply topically 2 (two) times daily. (Patient not taking: Reported on 05/18/2015) 3.5 g 1  . guaiFENesin (MUCINEX) 600 MG 12 hr tablet Take 2 tablets (1,200 mg total) by mouth 2 (two) times daily. (Patient not taking: Reported on 07/27/2015) 30 tablet 0  . hydroxychloroquine (PLAQUENIL) 200 MG tablet Take 1 tablet (200 mg total) by mouth 2 (two) times daily. (Patient not taking: Reported on 07/12/2015) 60 tablet 3  . ipratropium (ATROVENT) 0.02 % nebulizer solution Take 2.5 mLs (0.5 mg total) by nebulization once. (Patient not taking: Reported on 05/18/2015) 2.5 mL 0  . lidocaine (XYLOCAINE) 5 % ointment Apply 1 application topically as needed. (Patient not taking: Reported on 05/18/2015) 35.44 g 0  . rivaroxaban (XARELTO) 20 MG TABS tablet Take 1 tablet (20 mg total) by mouth daily with supper. (Patient not taking: Reported on 07/27/2015) 30 tablet 7  . acetaminophen-codeine (TYLENOL #3) 300-30 MG tablet Take 1 tablet  by mouth every 8 (eight) hours as needed for moderate pain. (Patient not taking: Reported on 07/12/2015) 60 tablet 0  . azithromycin (ZITHROMAX) 500 MG tablet Take 1 tablet (500 mg total) by mouth daily. (Patient not taking: Reported on 07/27/2015) 7 tablet 0   No facility-administered medications prior to visit.    ROS Review of Systems General: negative for fever, weight  loss, appetite change Eyes: no visual symptoms. ENT: no ear symptoms, no sinus tenderness, no nasal congestion or sore throat. Neck: no pain  Respiratory: no wheezing, shortness of breath on moderate exertion Cardiovascular: no chest pain, no dyspnea on exertion, no pedal edema, no orthopnea. Gastrointestinal: no abdominal pain, no diarrhea, no constipation Genito-Urinary: no urinary frequency, no dysuria, no polyuria. Hematologic: no bruising Endocrine: no cold or heat intolerance Neurological: no headaches, no seizures, no tremors Musculoskeletal: Arthralgias Skin: no pruritus, no rash. Psychological: no depression, no anxiety,   Objective:  BP 125/76 mmHg  Pulse 101  Temp(Src) 98.7 F (37.1 C) (Oral)  Resp 16  Ht 5\' 3"  (1.6 m)  Wt 247 lb (112.038 kg)  BMI 43.76 kg/m2  SpO2 93%  LMP 06/27/2015  BP/Weight 07/27/2015 07/14/2015 07/13/2015  Systolic BP 125 92 -  Diastolic BP 76 37 -  Wt. (Lbs) 247 - 287  BMI 43.76 - -      Physical Exam  Constitutional: She is oriented to person, place, and time. She appears well-developed and well-nourished. No distress.  Obese, Cushingoid facies  HENT:  Head: Normocephalic.  Right Ear: External ear normal.  Left Ear: External ear normal.  Nose: Nose normal.  Mouth/Throat: Oropharynx is clear and moist.  Eyes: Conjunctivae and EOM are normal. Pupils are equal, round, and reactive to light.  Neck: Normal range of motion. No JVD present.  Cardiovascular: Regular rhythm, normal heart sounds and intact distal pulses.  Tachycardia present.  Exam reveals no gallop.   No murmur heard. Pulmonary/Chest: Effort normal and breath sounds normal. No respiratory distress. She has no wheezes. She has no rales. She exhibits no tenderness.  Abdominal: Soft. Bowel sounds are normal. She exhibits no distension and no mass. There is no tenderness.  Musculoskeletal: Normal range of motion. She exhibits no edema or tenderness.  Neurological: She is alert  and oriented to person, place, and time. She has normal reflexes.  Skin: Skin is warm and dry. She is not diaphoretic.  Psychiatric: She has a normal mood and affect.     Assessment & Plan:   1. Screening for diabetes mellitus A1c of 5.6 - POCT A1C  2. Chronic pain syndrome - traMADol (ULTRAM) 50 MG tablet; Take 1 tablet (50 mg total) by mouth every 8 (eight) hours as needed.  Dispense: 60 tablet; Refill: 2  3. INTERSTITIAL LUNG DISEASE Oxygen is 93% on room air She is supposed to be on oxygen at rest and had been previously informed to call the DME company to refill her oxygen Remains on chronic steroids. Referred to pulmonary in house for optimization of management.  4. Rheumatoid arthritis involving multiple sites, unspecified rheumatoid factor presence (HCC) Currently no acute flares  5. Systemic lupus erythematosus (HCC) She has been lost to follow-up with her rheumatologist at [facility] and has been encouraged to call to set up an appointment. I have had this conversation with her at previous office visits regarding the importance of seeing a rheumatologist but she does not seem to comprehend this and always denies that this was ever discussed with her.  6. History of pulmonary embolism She has been Xarelto since discharge due to misunderstanding of discharge instructions. I have advised her to resume Xarelto as she most likely will remain on any anticoagulation for life  7. CAP (community acquired pneumonia) Resolved   Meds ordered this encounter  Medications  . traMADol (ULTRAM) 50 MG tablet    Sig: Take 1 tablet (50 mg total) by mouth every 8 (eight) hours as needed.    Dispense:  60 tablet    Refill:  2    Follow-up: Return in about 2 weeks (around 08/10/2015) for TCC-follow-up of pneumonia; place on Dr. Lynelle Doctor schedule- interstitial lung disease.   Jaclyn Shaggy MD

## 2015-07-27 NOTE — Progress Notes (Signed)
Patient's here for f/up. Patient states having constant pain all over body since last office visit.  Patient requesting medication refills.

## 2015-07-28 ENCOUNTER — Telehealth: Payer: Self-pay

## 2015-07-28 NOTE — Telephone Encounter (Signed)
This Case Manager placed call to [facility] to check on status of patient's portable oxygen refills since patient came to appointment on 07/27/15 without her portable oxygen.  Patient informed Robyne Peers, RN CM on 07/26/15 her portable oxygen tanks were empty. Spoke with Marchelle Folks with [facility] who indicated that since patient receiving oxygen through charity she will have to go to [facility] retail store to switch out her portable oxygen tanks when they are empty or running low.  Marchelle Folks indicated [facility] retail store located at: [address], [place], [place]. Hours: Monday-Friday 0900-1700.  This Case Manager placed call to patient to provide update.  Interpreter used through [employer] (Interpreter-Elizabeth [phone]). Informed patient she will have to go to [facility] retail store to switch out her empty portable oxygen tanks for full tanks.  Address given to patient. Patient verbalized understanding and indicated she would go to the store to switch out her portable oxygen tanks. In addition, reminded patient of importance of going to Department of Social Services to apply for Medicaid as instructed by Hulda Marin, SW at [facility]. Patient verbalized understanding and indicated she planned to go to Department of Social Services on Monday 08/02/15. No additional needs/concerns identified.

## 2015-08-03 ENCOUNTER — Telehealth: Payer: Self-pay

## 2015-08-03 NOTE — Telephone Encounter (Signed)
Attempted to contact the patient to inquire if she has been to the DSS to apply for medicaid.  Call placed with the assistance of Daneen Schick interpreter # [phone] with [employer].  There was no answer and a voice mail message was left informing the patient that the CM would call back at a later time.

## 2015-08-04 ENCOUNTER — Telehealth: Payer: Self-pay

## 2015-08-04 NOTE — Telephone Encounter (Signed)
If  the patient comes to the [facility] to inquire about the address for [facility], please provide her with this address:  [address] [place], [place] [address] [phone]

## 2015-08-04 NOTE — Telephone Encounter (Signed)
Call placed to the patient with the assistance of Albin Felling - Spanish Interpreter # [phone] with [employer]. Inquired if the patient was able to go to DSS to complete her medicaid application. She said that she went to the DSS office on Monday, 08/02/15 and has provided all of the information that they need except for a letter from her husband's boss stating his income.  She said that the boss is in [place] and will need to do the letter when he returns to the area.   She noted that she has not had the portable O2 tanks refilled and she is not able to write an address at this time as she does not read and write.  She said that she could come to the [facility] office to get the address and someone will be able to read it for her.   No other problems/questions reported.

## 2015-08-04 NOTE — Telephone Encounter (Signed)
This documentation was opened in error.

## 2015-08-09 ENCOUNTER — Telehealth: Payer: Self-pay

## 2015-08-09 NOTE — Telephone Encounter (Signed)
Attempted to contact the patientt to confirm her appointment at the [facility] for tomorrow, 08/10/15 @ 1100. Call placed to # [phone] (M) and the number was not accepting calls at this time. Call then placed to # [phone] (H) and a HIPAA compliant voice mail message was left requesting the patient return the call to # [phone]. Vernona Rieger, Spanish Interpreter # [phone] from [facility] assisted with the call.

## 2015-08-09 NOTE — Telephone Encounter (Signed)
Attempted to contact the patient to confirm her appointment at the [facility] tomorrow , 08/10/15 @ 1100 and to inquire if she has refilled her O2 tanks at [facility]. Call placed to [phone] (M) and the number was not accepting calls at this time. Call then placed to # [phone] (H) and a voice mail message was left noting that the CM would call back at a later time. Johnna Acosta, Spanish Interpreter # [phone] with [employer] assisted with the calls.

## 2015-08-10 ENCOUNTER — Ambulatory Visit: Payer: Self-pay | Admitting: Family Medicine

## 2015-08-12 ENCOUNTER — Telehealth: Payer: Self-pay

## 2015-08-12 NOTE — Telephone Encounter (Signed)
This Case Manager used [employer] (Spanish Interpreter [phone]) to place call to patient. Attempted to call patient at #[phone] to discuss rescheduling follow-up appointment since patient missed appointment on 08/10/15 at 1000 with Dr. Venetia Night. Also wanted to determine if patient switched out her portable oxygen tanks at [facility] retail store. Unable to reach patient at above number as per Interpreter number not accepting calls at this time.

## 2015-08-19 MED FILL — azaTHIOprine 50 MG TABS: 50 | 30 days supply | Qty: 150 | Fill #1

## 2015-08-19 MED FILL — traMADol HCL 50 MG TABS: 50 | 20 days supply | Qty: 60 | Fill #1

## 2015-08-19 NOTE — Progress Notes (Signed)
Patient came into clinic to reschedule appointment since she missed appointment on 08/10/15. Clinic scheduler rescheduled appointment for 08/26/15 at 1000 with Dr. Venetia Night. Patient also given address to [facility] and informed that empty portable tanks will have to be switched out for full tanks. Translator used.

## 2015-08-22 IMAGING — CR DG CHEST 2V
2 series · 2 of 2 positions shown · non-contrast
Comparison: 02/19/2002

CLINICAL DATA: Chest pain and cough.

EXAM:
CHEST - 2 VIEW

[w chest pa]
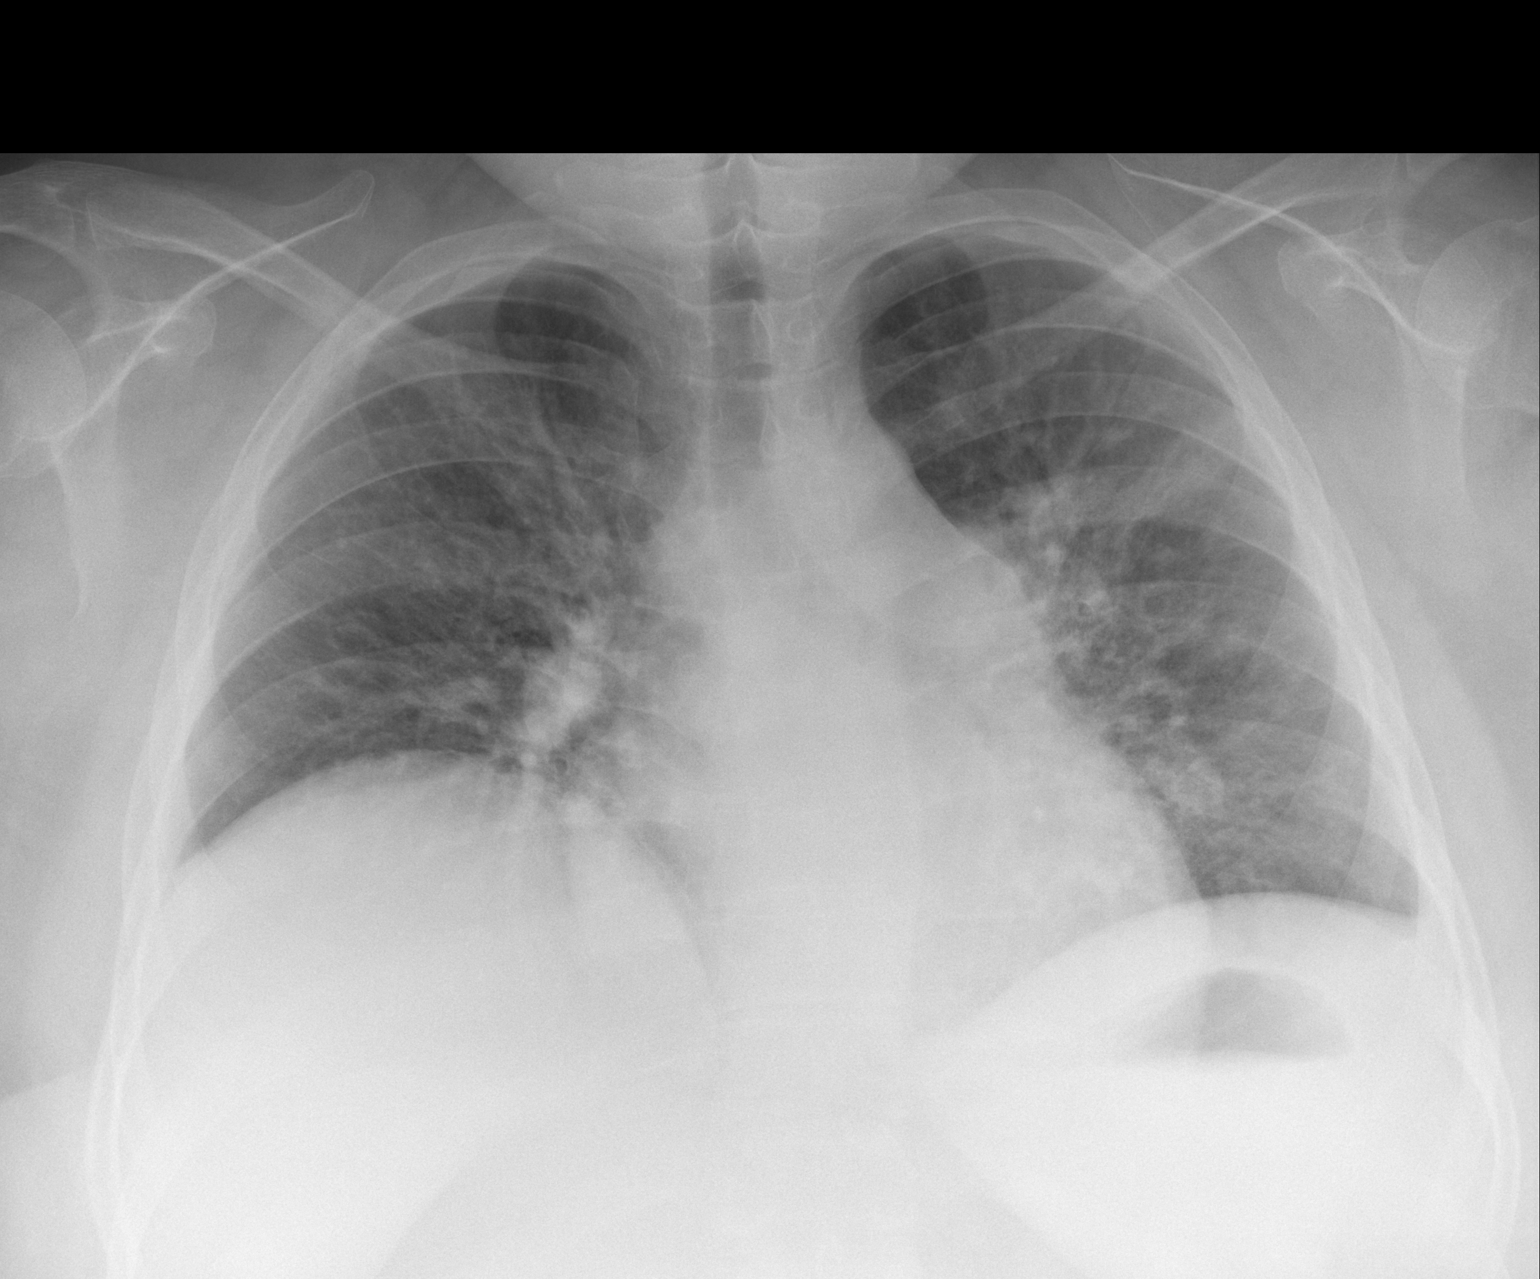

[w chest lat]
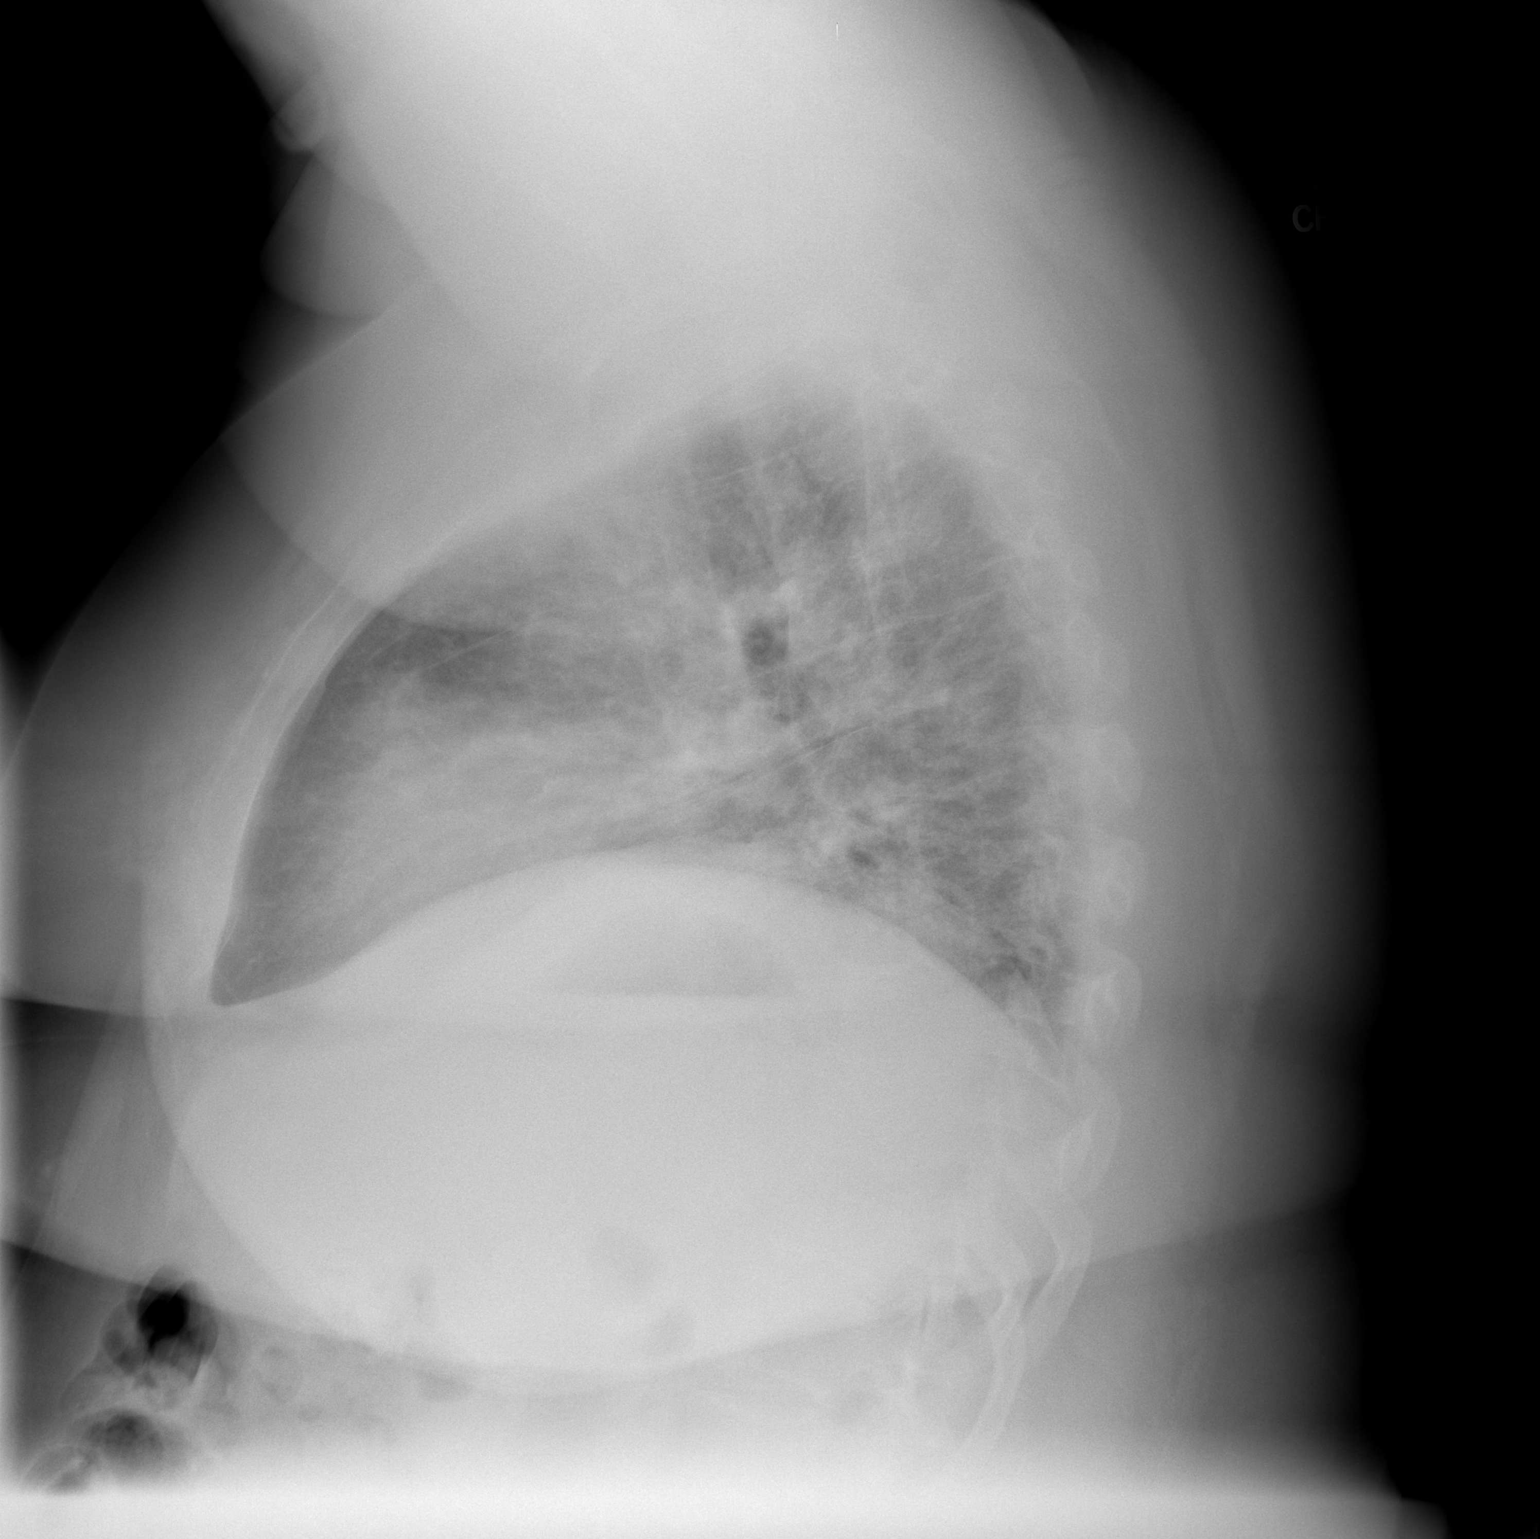

[2 of 2 positions shown; findings below may reference images not displayed]

FINDINGS: Lung volumes are very low with chronic elevation of the right
hemidiaphragm. There is atelectasis versus infiltrate in the left
upper lung. No overt edema or pleural fluid identified. The heart
size and mediastinal contours are normal. The bony thorax is
unremarkable.
IMPRESSION: Left upper lobe atelectasis versus infiltrate.

## 2015-08-25 ENCOUNTER — Telehealth: Payer: Self-pay

## 2015-08-25 NOTE — Telephone Encounter (Signed)
This Case Manager used [employer] (Spanish Interpreter-Alfredo [phone]) to place call to patient, to remind her of upcoming appointment on 08/26/15 at 1000. Unable to reach patient at [phone] as interpreter indicated phone number would ring a couple of times and then call was dropped. Attempted to reach patient x 2.

## 2015-08-26 ENCOUNTER — Encounter: Payer: Self-pay | Admitting: Family Medicine

## 2015-08-26 ENCOUNTER — Other Ambulatory Visit: Payer: Self-pay | Admitting: Family Medicine

## 2015-08-26 ENCOUNTER — Ambulatory Visit: Payer: Self-pay | Attending: Family Medicine | Admitting: Family Medicine

## 2015-08-26 VITALS — BP 116/82 | HR 99 | Temp 98.4°F | Resp 16 | Ht 63.0 in | Wt 278.8 lb

## 2015-08-26 DIAGNOSIS — G4730 Sleep apnea, unspecified: Secondary | ICD-10-CM | POA: Insufficient documentation

## 2015-08-26 DIAGNOSIS — M329 Systemic lupus erythematosus, unspecified: Secondary | ICD-10-CM | POA: Insufficient documentation

## 2015-08-26 DIAGNOSIS — J8410 Pulmonary fibrosis, unspecified: Secondary | ICD-10-CM

## 2015-08-26 DIAGNOSIS — G894 Chronic pain syndrome: Secondary | ICD-10-CM | POA: Insufficient documentation

## 2015-08-26 DIAGNOSIS — F329 Major depressive disorder, single episode, unspecified: Secondary | ICD-10-CM | POA: Insufficient documentation

## 2015-08-26 DIAGNOSIS — I10 Essential (primary) hypertension: Secondary | ICD-10-CM | POA: Insufficient documentation

## 2015-08-26 DIAGNOSIS — J45909 Unspecified asthma, uncomplicated: Secondary | ICD-10-CM | POA: Insufficient documentation

## 2015-08-26 DIAGNOSIS — J849 Interstitial pulmonary disease, unspecified: Secondary | ICD-10-CM | POA: Insufficient documentation

## 2015-08-26 DIAGNOSIS — E669 Obesity, unspecified: Secondary | ICD-10-CM | POA: Insufficient documentation

## 2015-08-26 DIAGNOSIS — M25531 Pain in right wrist: Secondary | ICD-10-CM | POA: Insufficient documentation

## 2015-08-26 DIAGNOSIS — M25562 Pain in left knee: Secondary | ICD-10-CM | POA: Insufficient documentation

## 2015-08-26 DIAGNOSIS — Z86711 Personal history of pulmonary embolism: Secondary | ICD-10-CM | POA: Insufficient documentation

## 2015-08-26 DIAGNOSIS — G8929 Other chronic pain: Secondary | ICD-10-CM | POA: Insufficient documentation

## 2015-08-26 DIAGNOSIS — M069 Rheumatoid arthritis, unspecified: Secondary | ICD-10-CM | POA: Insufficient documentation

## 2015-08-26 DIAGNOSIS — Z79899 Other long term (current) drug therapy: Secondary | ICD-10-CM | POA: Insufficient documentation

## 2015-08-26 DIAGNOSIS — M25532 Pain in left wrist: Secondary | ICD-10-CM | POA: Insufficient documentation

## 2015-08-26 DIAGNOSIS — M25511 Pain in right shoulder: Secondary | ICD-10-CM | POA: Insufficient documentation

## 2015-08-26 DIAGNOSIS — Z7901 Long term (current) use of anticoagulants: Secondary | ICD-10-CM | POA: Insufficient documentation

## 2015-08-26 DIAGNOSIS — Z9981 Dependence on supplemental oxygen: Secondary | ICD-10-CM | POA: Insufficient documentation

## 2015-08-26 DIAGNOSIS — M25561 Pain in right knee: Secondary | ICD-10-CM | POA: Insufficient documentation

## 2015-08-26 DIAGNOSIS — Z9889 Other specified postprocedural states: Secondary | ICD-10-CM | POA: Insufficient documentation

## 2015-08-26 DIAGNOSIS — I2782 Chronic pulmonary embolism: Secondary | ICD-10-CM

## 2015-08-26 MED ORDER — RIVAROXABAN 20 MG PO TABS: 20.0000 mg | ORAL_TABLET | Freq: Every day | ORAL | Status: DC

## 2015-08-26 MED FILL — **XARELTO 20 MG TABLET: 20 MG | 30 days supply | Qty: 30 | Fill #0

## 2015-08-26 NOTE — Progress Notes (Signed)
Patient report generalized pain in knee, shoulder and arm SHe states she cannot read She brought meds she is taking She does not have her O2 with her

## 2015-08-26 NOTE — Progress Notes (Signed)
Subjective:    Patient ID: Donna Hall, female    DOB: 04-03-1986, 30 y.o.   MRN: [id number]  HPI  30 year old female with a history of pulmonary embolism, rheumatoid arthritis, lupus (previously followed by rheumatology at [facility] but was lost to follow-up), interstitial lung disease (last seen by [facility] in 07/2014), cough variant asthma with multiple hospitalizations for acute respiratory failure with hypoxia who comes into the clinic for a follow-up visit.  She has been noncompliant with her follow-up visits and her rheumatologist at [facility] as well as her pulmonologist. Review of her medications indicate she is not taking Xarelto despite the fact that I had discussed this with her at her last visit and emphasized the fact that she would remain on anticoagulation for life but she repeats today exactly what she said last week which was that she was taken off Xarelto discharge and review of her discharge summary indicates otherwise.  She complains of pain in both shoulders, both wrists and both knees with some limited range of motion; she does have chronic pain and takes tramadol for this. She is also out of her portable oxygen and case managers have gotten in touch with her and provided the address to [facility] which is across the street from the clinic and she was to take her empty oxygen tanks over there to switch them out.  Past Medical History  Diagnosis Date  . Psoriasis 2010    as a child  . Chronic pain disorder   . Obesity   . Chronic steroid use 2010  . Lupus (HCC) 2000  . Lupus (systemic lupus erythematosus) (HCC) 2010  . Rheumatoid arthritis(714.0) 2010  . Hypertension 2010  . Pneumonia "several times"  . Interstitial lung disease (HCC)     [name] 05/15/2014  . On home oxygen therapy     "2L; 24/7" (05/15/2014)  . Pulmonary embolism (HCC)     hx/notes 05/15/2014  . History of blood transfusion     "because white  cells ate the red cells"  . Headache     "just when I have the pain from aching bones and psorasis and/or lupus symptoms" (05/15/2104)  . 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 Asthma   . Gestational diabetes 2006  . Depression   . Sleep apnea     Past Surgical History  Procedure Laterality Date  . Thigh / knee soft tissue biopsy Right 2011    thigh  . Cesarean section  2010    Current Outpatient Prescriptions on File Prior to Visit  Medication Sig Dispense Refill  . azaTHIOprine (IMURAN) 50 MG tablet Take 5 tablets (250 mg total) by mouth daily. 150 tablet 3  . mometasone-formoterol (DULERA) 100-5 MCG/ACT AERO Take 2 puffs first thing in am and then another 2 puffs about 12 hours later. 1 Inhaler 2  . predniSONE (DELTASONE) 10 MG tablet 6 tabs, 5 days, 5 tabs, 5 days,4tabs , 5 days, 3 tabs, 5 days, 2 tabs, 5 days 1 tab , 5 days, 1/2 tab  5 days 120 tablet 0  . traMADol (ULTRAM) 50 MG tablet Take 1 tablet (50 mg total) by mouth every 8 (eight) hours as needed. 60 tablet 2  . albuterol (PROVENTIL HFA;VENTOLIN HFA) 108 (90 BASE) MCG/ACT inhaler Inhale 2 puffs into the lungs every 4 (four) hours as needed for wheezing or shortness of breath. (  Patient not taking: Reported on 08/26/2015) 1 Inhaler 3  . cefdinir (OMNICEF) 300 MG capsule Take 1 capsule (300 mg total) by mouth 2 (two) times daily. (Patient not taking: Reported on 08/26/2015) 14 capsule 0  . CONDYLOX 0.5 % gel Apply topically 2 (two) times daily. (Patient not taking: Reported on 05/18/2015) 3.5 g 1  . guaiFENesin (MUCINEX) 600 MG 12 hr tablet Take 2 tablets (1,200 mg total) by mouth 2 (two) times daily. (Patient not taking: Reported on 07/27/2015) 30 tablet 0  . hydroxychloroquine (PLAQUENIL) 200 MG tablet Take 1 tablet (200 mg total) by mouth 2 (two) times daily. (Patient not taking: Reported on 07/12/2015) 60 tablet 3  . hydrOXYzine (ATARAX/VISTARIL) 10 MG tablet Take 1  tablet (10 mg total) by mouth every 8 (eight) hours as needed for itching. (Patient not taking: Reported on 08/26/2015) 90 tablet 5  . ipratropium (ATROVENT) 0.02 % nebulizer solution Take 2.5 mLs (0.5 mg total) by nebulization once. (Patient not taking: Reported on 05/18/2015) 2.5 mL 0  . lidocaine (XYLOCAINE) 5 % ointment Apply 1 application topically as needed. (Patient not taking: Reported on 05/18/2015) 35.44 g 0   No current facility-administered medications on file prior to visit.     Review of Systems  Constitutional: Negative for activity change, appetite change and fatigue.  HENT: Negative for congestion, sinus pressure and sore throat.   Eyes: Negative for visual disturbance.  Respiratory: Negative for cough, chest tightness, shortness of breath and wheezing.   Cardiovascular: Negative for chest pain and palpitations.  Gastrointestinal: Negative for abdominal pain, constipation and abdominal distention.  Endocrine: Negative for polydipsia.  Genitourinary: Negative for dysuria and frequency.  Musculoskeletal:       See history of present illness  Skin: Negative for rash.  Neurological: Negative for tremors, light-headedness and numbness.  Hematological: Does not bruise/bleed easily.  Psychiatric/Behavioral: Negative for behavioral problems and agitation.       Objective: Filed Vitals:   08/26/15 1025  BP: 116/82  Pulse: 99  Temp: 98.4 F (36.9 C)  Resp: 16  Height: 5\' 3"  (1.6 m)  Weight: 278 lb 12.8 oz (126.463 kg)  SpO2: 93%      Physical Exam  Constitutional: She is oriented to person, place, and time. She appears well-developed and well-nourished.  Cushingoid facies  Cardiovascular: Normal rate, normal heart sounds and intact distal pulses.   No murmur heard. Pulmonary/Chest: Effort normal and breath sounds normal. She has no wheezes. She has no rales. She exhibits no tenderness.  Abdominal: Soft. Bowel sounds are normal. She exhibits no distension and no mass.  There is no tenderness.  Musculoskeletal: She exhibits tenderness (tenderness on palpation and range of motion of both shoulder and knee joints; forward elevation of both shoulders limited to 120,.).  Neurological: She is alert and oriented to person, place, and time.          Assessment & Plan:  1. Chronic pain syndrome I have explained to her that she does have refills at the pharmacy and only needs to walk by the pharmacy to pick up her refills - traMADol (ULTRAM) 50 MG tablet; Take 1 tablet (50 mg total) by mouth every 8 (eight) hours as needed.  Dispense: 60 tablet; Refill: 2  2. INTERSTITIAL LUNG DISEASE Oxygen is 93% on room air She is supposed to be on oxygen at rest and was given the address to [facility] so she can go switch her empty oxygen tanks which she has with her for new  ones  Remains on chronic steroids. Referred to pulmonary in house for optimization of management.  3. Rheumatoid arthritis involving multiple sites, unspecified rheumatoid factor presence (HCC) Pain in multiple joints likely secondary to rheumatoid flare Emphasized the need for her to see her rheumatologist at [facility].   4. Systemic lupus erythematosus [facility]) She has been lost to follow-up with her rheumatologist at [facility] and has been encouraged to call to set up an appointment. I have had this conversation with her at previous office visits regarding the importance of seeing a rheumatologist but she does not seem to comprehend this and always denies that this was ever discussed with her.  5. History of pulmonary embolism She has been off Xarelto since discharge due to misunderstanding of discharge instructions. I  advised her to resume Xarelto as she most likely will remain on any anticoagulation for life and today she is still not taking Xarelto despite being advised of this at her last office visit.   She has very limited understanding of her medical  issues and instructions have to be repeated to her over and over again at several office visits despite the use of an interpreter   This note has been created with Education officer, environmental. Any transcriptional errors are unintentional.

## 2015-08-26 NOTE — Progress Notes (Signed)
Patient presented to clinic today for appointment with Dr. Venetia Night. Patient brought empty portable oxygen but was confused about where to take portable oxygen tanks per Lance Bosch, RN. Empty portable oxygen taken to [facility] retail store and switched for full tank. Patient called to provide update and to inform her to pick up portable oxygen from [facility]. No additional needs identified.

## 2015-08-31 ENCOUNTER — Telehealth: Payer: Self-pay | Admitting: Family Medicine

## 2015-08-31 MED FILL — traMADol HCL 50 MG TABS: 50 | 20 days supply | Qty: 60 | Fill #1

## 2015-08-31 MED FILL — predniSONE 10 MG TABS: 10 | 30 days supply | Qty: 30 | Fill #1

## 2015-08-31 NOTE — Telephone Encounter (Signed)
Pt. Came in today to see if PCP can prescribe her a Rx because she is coughing and having a hard trouble breathing. Please f/u.

## 2015-09-01 NOTE — Telephone Encounter (Signed)
LVM to return call   Pt need office visit, please schedule

## 2015-09-05 ENCOUNTER — Inpatient Hospital Stay (HOSPITAL_COMMUNITY)
Admission: EM | Admit: 2015-09-05 | Discharge: 2015-09-09 | DRG: 781 | Disposition: A | Discharge status: Home / Self Care | Payer: Medicaid Other | Attending: Internal Medicine | Admitting: Internal Medicine

## 2015-09-05 ENCOUNTER — Emergency Department (HOSPITAL_COMMUNITY): Payer: Medicaid Other

## 2015-09-05 ENCOUNTER — Encounter (HOSPITAL_COMMUNITY): Payer: Self-pay | Admitting: Family Medicine

## 2015-09-05 DIAGNOSIS — J189 Pneumonia, unspecified organism: Secondary | ICD-10-CM | POA: Diagnosis present

## 2015-09-05 DIAGNOSIS — J45901 Unspecified asthma with (acute) exacerbation: Secondary | ICD-10-CM

## 2015-09-05 DIAGNOSIS — F32A Depression, unspecified: Secondary | ICD-10-CM | POA: Diagnosis present

## 2015-09-05 DIAGNOSIS — O99011 Anemia complicating pregnancy, first trimester: Secondary | ICD-10-CM | POA: Diagnosis present

## 2015-09-05 DIAGNOSIS — I10 Essential (primary) hypertension: Secondary | ICD-10-CM | POA: Diagnosis present

## 2015-09-05 DIAGNOSIS — Z3201 Encounter for pregnancy test, result positive: Secondary | ICD-10-CM | POA: Diagnosis present

## 2015-09-05 DIAGNOSIS — Z6841 Body Mass Index (BMI) 40.0 and over, adult: Secondary | ICD-10-CM

## 2015-09-05 DIAGNOSIS — O161 Unspecified maternal hypertension, first trimester: Secondary | ICD-10-CM | POA: Diagnosis present

## 2015-09-05 DIAGNOSIS — Z3A09 9 weeks gestation of pregnancy: Secondary | ICD-10-CM | POA: Diagnosis not present

## 2015-09-05 DIAGNOSIS — O99351 Diseases of the nervous system complicating pregnancy, first trimester: Secondary | ICD-10-CM | POA: Diagnosis present

## 2015-09-05 DIAGNOSIS — Z9981 Dependence on supplemental oxygen: Secondary | ICD-10-CM | POA: Diagnosis not present

## 2015-09-05 DIAGNOSIS — Z7952 Long term (current) use of systemic steroids: Secondary | ICD-10-CM

## 2015-09-05 DIAGNOSIS — R0682 Tachypnea, not elsewhere classified: Secondary | ICD-10-CM | POA: Diagnosis present

## 2015-09-05 DIAGNOSIS — Z3491 Encounter for supervision of normal pregnancy, unspecified, first trimester: Secondary | ICD-10-CM

## 2015-09-05 DIAGNOSIS — O99511 Diseases of the respiratory system complicating pregnancy, first trimester: Secondary | ICD-10-CM | POA: Diagnosis present

## 2015-09-05 DIAGNOSIS — Z7951 Long term (current) use of inhaled steroids: Secondary | ICD-10-CM | POA: Diagnosis not present

## 2015-09-05 DIAGNOSIS — Z8632 Personal history of gestational diabetes: Secondary | ICD-10-CM

## 2015-09-05 DIAGNOSIS — Z79899 Other long term (current) drug therapy: Secondary | ICD-10-CM

## 2015-09-05 DIAGNOSIS — G894 Chronic pain syndrome: Secondary | ICD-10-CM | POA: Diagnosis present

## 2015-09-05 DIAGNOSIS — Z3490 Encounter for supervision of normal pregnancy, unspecified, unspecified trimester: Secondary | ICD-10-CM

## 2015-09-05 DIAGNOSIS — M329 Systemic lupus erythematosus, unspecified: Secondary | ICD-10-CM | POA: Diagnosis present

## 2015-09-05 DIAGNOSIS — Z86711 Personal history of pulmonary embolism: Secondary | ICD-10-CM | POA: Diagnosis not present

## 2015-09-05 DIAGNOSIS — M069 Rheumatoid arthritis, unspecified: Secondary | ICD-10-CM | POA: Diagnosis present

## 2015-09-05 DIAGNOSIS — O99211 Obesity complicating pregnancy, first trimester: Secondary | ICD-10-CM | POA: Diagnosis present

## 2015-09-05 DIAGNOSIS — R000 Tachycardia, unspecified: Secondary | ICD-10-CM | POA: Diagnosis present

## 2015-09-05 DIAGNOSIS — R059 Cough, unspecified: Secondary | ICD-10-CM

## 2015-09-05 DIAGNOSIS — R0902 Hypoxemia: Secondary | ICD-10-CM | POA: Diagnosis present

## 2015-09-05 DIAGNOSIS — F329 Major depressive disorder, single episode, unspecified: Secondary | ICD-10-CM | POA: Diagnosis present

## 2015-09-05 DIAGNOSIS — O99341 Other mental disorders complicating pregnancy, first trimester: Secondary | ICD-10-CM | POA: Diagnosis present

## 2015-09-05 DIAGNOSIS — G4733 Obstructive sleep apnea (adult) (pediatric): Secondary | ICD-10-CM | POA: Diagnosis present

## 2015-09-05 DIAGNOSIS — E66813 Obesity, class 3: Secondary | ICD-10-CM | POA: Diagnosis present

## 2015-09-05 DIAGNOSIS — Z09 Encounter for follow-up examination after completed treatment for conditions other than malignant neoplasm: Secondary | ICD-10-CM

## 2015-09-05 DIAGNOSIS — R0602 Shortness of breath: Secondary | ICD-10-CM | POA: Diagnosis present

## 2015-09-05 DIAGNOSIS — F419 Anxiety disorder, unspecified: Secondary | ICD-10-CM

## 2015-09-05 DIAGNOSIS — R05 Cough: Secondary | ICD-10-CM

## 2015-09-05 LAB — CBC WITH DIFFERENTIAL/PLATELET
BASOS PCT: 0 %
Basophils Absolute: 0 10*3/uL (ref 0.0–0.1)
EOS ABS: 0 10*3/uL (ref 0.0–0.7)
Eosinophils Relative: 0 %
HCT: 39.3 % (ref 36.0–46.0)
Hemoglobin: 11 g/dL — ABNORMAL LOW (ref 12.0–15.0)
LYMPHS ABS: 1.4 10*3/uL (ref 0.7–4.0)
Lymphocytes Relative: 21 %
MCH: 22.7 pg — AB (ref 26.0–34.0)
MCHC: 28 g/dL — AB (ref 30.0–36.0)
MCV: 81.2 fL (ref 78.0–100.0)
Monocytes Absolute: 0.4 10*3/uL (ref 0.1–1.0)
Monocytes Relative: 6 %
Neutro Abs: 4.8 10*3/uL (ref 1.7–7.7)
Neutrophils Relative %: 73 %
Platelets: 256 10*3/uL (ref 150–400)
RBC: 4.84 MIL/uL (ref 3.87–5.11)
RDW: 16.9 % — ABNORMAL HIGH (ref 11.5–15.5)
WBC: 6.7 10*3/uL (ref 4.0–10.5)

## 2015-09-05 LAB — BASIC METABOLIC PANEL
Anion gap: 13 (ref 5–15)
BUN: 13 mg/dL (ref 6–20)
CALCIUM: 8.7 mg/dL — AB (ref 8.9–10.3)
CO2: 25 mmol/L (ref 22–32)
CREATININE: 0.52 mg/dL (ref 0.44–1.00)
Chloride: 102 mmol/L (ref 101–111)
GFR calc Af Amer: >60 mL/min (ref >60)
Glucose, Bld: 128 mg/dL — ABNORMAL HIGH (ref 65–99)
Potassium: 4.4 mmol/L (ref 3.5–5.1)
SODIUM: 140 mmol/L (ref 135–145)

## 2015-09-05 LAB — INFLUENZA PANEL BY PCR (TYPE A & B)
H1N1 flu by pcr: NOT DETECTED
INFLAPCR: NEGATIVE
Influenza B By PCR: NEGATIVE

## 2015-09-05 LAB — PHOSPHORUS: Phosphorus: 3.7 mg/dL (ref 2.5–4.6)

## 2015-09-05 LAB — I-STAT BETA HCG BLOOD, ED (MC, WL, AP ONLY)

## 2015-09-05 LAB — MAGNESIUM: Magnesium: 1.8 mg/dL (ref 1.7–2.4)

## 2015-09-05 LAB — HCG, QUANTITATIVE, PREGNANCY: HCG, BETA CHAIN, QUANT, S: 64199 m[IU]/mL — AB (ref <5)

## 2015-09-05 MED ORDER — PREDNISONE 20 MG PO TABS
60.0000 mg | ORAL_TABLET | Freq: Once | ORAL | Status: AC
  Administered 2015-09-05: 60 mg via ORAL
  Filled 2015-09-05: qty 3

## 2015-09-05 MED ORDER — DEXTROSE 5 % IV SOLN
500.0000 mg | Freq: Once | INTRAVENOUS | Status: AC
  Administered 2015-09-05: 500 mg via INTRAVENOUS
  Filled 2015-09-05: qty 500

## 2015-09-05 MED ORDER — IPRATROPIUM-ALBUTEROL 0.5-2.5 (3) MG/3ML IN SOLN
3.0000 mL | Freq: Four times a day (QID) | RESPIRATORY_TRACT | Status: DC
  Administered 2015-09-05 – 2015-09-08 (×11): 3 mL via RESPIRATORY_TRACT
  Filled 2015-09-05 (×11): qty 3

## 2015-09-05 MED ORDER — ALBUTEROL SULFATE (2.5 MG/3ML) 0.083% IN NEBU
5.0000 mg | INHALATION_SOLUTION | Freq: Once | RESPIRATORY_TRACT | Status: AC
  Administered 2015-09-05: 5 mg via RESPIRATORY_TRACT

## 2015-09-05 MED ORDER — SODIUM CHLORIDE 0.9 % IV BOLUS (SEPSIS)
500.0000 mL | Freq: Once | INTRAVENOUS | Status: AC
  Administered 2015-09-05: 500 mL via INTRAVENOUS

## 2015-09-05 MED ORDER — MAGNESIUM SULFATE 2 GM/50ML IV SOLN
2.0000 g | Freq: Once | INTRAVENOUS | Status: AC
  Administered 2015-09-05: 2 g via INTRAVENOUS
  Filled 2015-09-05: qty 50

## 2015-09-05 MED ORDER — ALBUTEROL SULFATE (2.5 MG/3ML) 0.083% IN NEBU
5.0000 mg | INHALATION_SOLUTION | Freq: Once | RESPIRATORY_TRACT | Status: AC
  Administered 2015-09-05: 5 mg via RESPIRATORY_TRACT
  Filled 2015-09-05: qty 6

## 2015-09-05 MED ORDER — ENOXAPARIN SODIUM 150 MG/ML ~~LOC~~ SOLN
130.0000 mg | Freq: Two times a day (BID) | SUBCUTANEOUS | Status: DC
  Administered 2015-09-06 – 2015-09-09 (×7): 130 mg via SUBCUTANEOUS
  Filled 2015-09-05 (×5): qty 0.87
  Filled 2015-09-05: qty 1
  Filled 2015-09-05: qty 0.87
  Filled 2015-09-05: qty 1
  Filled 2015-09-05: qty 0.87

## 2015-09-05 MED ORDER — IPRATROPIUM BROMIDE 0.02 % IN SOLN
0.5000 mg | Freq: Once | RESPIRATORY_TRACT | Status: AC
  Administered 2015-09-05: 0.5 mg via RESPIRATORY_TRACT
  Filled 2015-09-05: qty 2.5

## 2015-09-05 MED ORDER — ACETAMINOPHEN 325 MG PO TABS
650.0000 mg | ORAL_TABLET | Freq: Once | ORAL | Status: AC
  Administered 2015-09-05: 650 mg via ORAL
  Filled 2015-09-05: qty 2

## 2015-09-05 MED ORDER — DEXTROSE 5 % IV SOLN
1.0000 g | Freq: Once | INTRAVENOUS | Status: AC
  Administered 2015-09-05: 1 g via INTRAVENOUS
  Filled 2015-09-05: qty 10

## 2015-09-05 MED ORDER — FENTANYL CITRATE (PF) 100 MCG/2ML IJ SOLN
50.0000 ug | Freq: Once | INTRAMUSCULAR | Status: AC
  Administered 2015-09-05: 50 ug via INTRAVENOUS
  Filled 2015-09-05: qty 2

## 2015-09-05 NOTE — ED Notes (Signed)
Patient transported to Ultrasound 

## 2015-09-05 NOTE — ED Provider Notes (Signed)
CSN: [id number]     Arrival date & time 09/05/15  1313 History   First MD Initiated Contact with Patient 09/05/15 1504     Chief Complaint  Patient presents with  . Cough  . Generalized Body Aches     (Consider location/radiation/quality/duration/timing/severity/associated sxs/prior Treatment) Patient is a 30 y.o. female presenting with cough. The history is provided by the patient.  Cough Cough characteristics:  Productive Sputum characteristics:  Yellow Severity:  Severe Onset quality:  Gradual Duration:  1 week Timing:  Constant Progression:  Worsening Chronicity:  Recurrent Smoker: no   Context: upper respiratory infection   Context comment:  Fever starting yesterday, wheezing for the last 3 days Relieved by:  Nothing Worsened by:  Activity Ineffective treatments:  Beta-agonist inhaler Associated symptoms: chills, fever, myalgias, shortness of breath, sinus congestion and wheezing     Past Medical History  Diagnosis Date  . Psoriasis 2010    as a child  . Chronic pain disorder   . Obesity   . Chronic steroid use 2010  . Lupus (HCC) 2000  . Lupus (systemic lupus erythematosus) (HCC) 2010  . Rheumatoid arthritis(714.0) 2010  . Hypertension 2010  . Pneumonia "several times"  . Interstitial lung disease (HCC)     [name] 05/15/2014  . On home oxygen therapy     "2L; 24/7" (05/15/2014)  . Pulmonary embolism (HCC)     hx/notes 05/15/2014  . History of blood transfusion     "because white cells ate the red cells"  . Headache     "just when I have the pain from aching bones and psorasis and/or lupus symptoms" (05/15/2104)  . 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 Asthma   . Gestational diabetes 2006  . Depression   . Sleep apnea    Past Surgical History  Procedure Laterality Date  . Thigh / knee soft tissue biopsy Right 2011    thigh  . Cesarean section  2010   Family History  Problem Relation  Age of Onset  . Diabetes Mother   . Hypertension Mother   . Cancer Neg Hx   . Early death Neg Hx   . Heart disease Neg Hx    Social History  Substance Use Topics  . Smoking status: Never Smoker   . Smokeless tobacco: Never Used  . Alcohol Use: No   OB History    No data available     Review of Systems  Constitutional: Positive for fever and chills.  Respiratory: Positive for cough, shortness of breath and wheezing.   Musculoskeletal: Positive for myalgias.  All other systems reviewed and are negative.     Allergies  Review of patient's allergies indicates no known allergies.  Home Medications   Prior to Admission medications   Medication Sig Start Date End Date Taking? Authorizing Provider  albuterol (PROVENTIL HFA;VENTOLIN HFA) 108 (90 BASE) MCG/ACT inhaler Inhale 2 puffs into the lungs every 4 (four) hours as needed for wheezing or shortness of breath. Patient not taking: Reported on 08/26/2015 04/03/15   Osvaldo Shipper, MD  azaTHIOprine (IMURAN) 50 MG tablet Take 5 tablets (250 mg total) by mouth daily. 06/24/15   Dessa Phi, MD  cefdinir (OMNICEF) 300 MG capsule Take 1 capsule (300 mg total) by mouth 2 (two) times daily. Patient not taking: Reported on 08/26/2015 07/14/15   Richarda Overlie, MD  CONDYLOX 0.5 % gel Apply topically 2 (two) times daily. Patient  not taking: Reported on 05/18/2015 04/09/15   Jaclyn Shaggy, MD  guaiFENesin (MUCINEX) 600 MG 12 hr tablet Take 2 tablets (1,200 mg total) by mouth 2 (two) times daily. Patient not taking: Reported on 07/27/2015 07/14/15   Richarda Overlie, MD  hydroxychloroquine (PLAQUENIL) 200 MG tablet Take 1 tablet (200 mg total) by mouth 2 (two) times daily. Patient not taking: Reported on 07/12/2015 10/26/14   Adrian Blackwater Moding, MD  hydrOXYzine (ATARAX/VISTARIL) 10 MG tablet Take 1 tablet (10 mg total) by mouth every 8 (eight) hours as needed for itching. Patient not taking: Reported on 08/26/2015 02/05/15   Vivianne Master, PA-C  ipratropium  (ATROVENT) 0.02 % nebulizer solution Take 2.5 mLs (0.5 mg total) by nebulization once. Patient not taking: Reported on 05/18/2015 05/03/15   Jaclyn Shaggy, MD  lidocaine (XYLOCAINE) 5 % ointment Apply 1 application topically as needed. Patient not taking: Reported on 05/18/2015 04/07/15   Ricarda Frame, MD  mometasone-formoterol [facility]) 100-5 MCG/ACT AERO Take 2 puffs first thing in am and then another 2 puffs about 12 hours later. 04/03/15   Osvaldo Shipper, MD  predniSONE (DELTASONE) 10 MG tablet 6 tabs, 5 days, 5 tabs, 5 days,4tabs , 5 days, 3 tabs, 5 days, 2 tabs, 5 days 1 tab , 5 days, 1/2 tab  5 days 07/14/15   Richarda Overlie, MD  rivaroxaban (XARELTO) 20 MG TABS tablet Take 1 tablet (20 mg total) by mouth daily with supper. 08/26/15   Jaclyn Shaggy, MD  traMADol (ULTRAM) 50 MG tablet Take 1 tablet (50 mg total) by mouth every 8 (eight) hours as needed. 07/27/15   Jaclyn Shaggy, MD   BP 108/61 mmHg  Pulse 92  Temp(Src) 98.3 F (36.8 C)  Resp 28  SpO2 98% Physical Exam  Constitutional: She is oriented to person, place, and time. She appears well-developed and well-nourished. No distress.  Morbidly obese  HENT:  Head: Normocephalic and atraumatic.  Mouth/Throat: Oropharynx is clear and moist.  Eyes: Conjunctivae and EOM are normal. Pupils are equal, round, and reactive to light.  Neck: Normal range of motion. Neck supple.  Cardiovascular: Normal rate, regular rhythm and intact distal pulses.   No murmur heard. Pulmonary/Chest: Effort normal. Tachypnea noted. No respiratory distress. She has decreased breath sounds. She has wheezes. She has no rales.  Abdominal: Soft. She exhibits no distension. There is no tenderness. There is no rebound and no guarding.  Musculoskeletal: Normal range of motion. She exhibits no edema or tenderness.  Neurological: She is alert and oriented to person, place, and time.  Skin: Skin is warm and dry. No rash noted. No erythema.  Psychiatric: She has a normal mood  and affect. Her behavior is normal.  Nursing note and vitals reviewed.   ED Course  Procedures (including critical care time) Labs Review Labs Reviewed  CBC WITH DIFFERENTIAL/PLATELET - Abnormal; Notable for the following:    Hemoglobin 11.0 (*)    MCH 22.7 (*)    MCHC 28.0 (*)    RDW 16.9 (*)    All other components within normal limits  BASIC METABOLIC PANEL - Abnormal; Notable for the following:    Glucose, Bld 128 (*)    Calcium 8.7 (*)    All other components within normal limits  I-STAT BETA HCG BLOOD, ED ([facility], [facility], [facility] ONLY) - Abnormal; Notable for the following:    I-stat hCG, quantitative >2000.0 (*)    All other components within normal limits  INFLUENZA PANEL BY PCR (TYPE A & B,  H1N1)  HCG, QUANTITATIVE, PREGNANCY    Imaging Review Dg Chest 2 View  09/05/2015  CLINICAL DATA:  Cough with body aches for 5 days EXAM: CHEST  2 VIEW COMPARISON:  07/12/2015.  04/01/2015 FINDINGS: Low volume film. The bilateral diffuse interstitial lung disease is again noted. There is patchy airspace opacity at the left base, stable to slightly progressed in the interval. The flame shaped opacity in the left upper lung persists without interval change. Cardiopericardial silhouette is at upper limits of normal for size. The visualized bony structures of the thorax are intact. IMPRESSION: No substantial change in the nearly 2 month interval since the prior study but left lung airspace disease and focal upper lobe opacity is new since 04/01/2015. CT chest may prove helpful to further evaluate. Electronically Signed   By: Kennith Center M.D.   On: 09/05/2015 15:10   US Ob Comp Less 14 Wks  09/05/2015  CLINICAL DATA:  Positive pregnancy test EXAM: OBSTETRIC <14 WK [place] AND TRANSVAGINAL OB US TECHNIQUE: Both transabdominal and transvaginal ultrasound examinations were performed for complete evaluation of the gestation as well as the maternal uterus, adnexal regions, and pelvic cul-de-sac. Transvaginal  technique was performed to assess early pregnancy. COMPARISON:  None. FINDINGS: Intrauterine gestational sac: Visualized/normal in shape. Yolk sac:  Not visualized Embryo:  Visualized Cardiac Activity: Visualized Heart Rate: 157  bpm MSD: 27.9  mm   Seven w   6  d CRL:  22.8  mm   9 w   0 d                  [place] EDC: 04/08/2016 Subchorionic hemorrhage: None visualized. A small amount of fluid in the cervical canal. Maternal uterus/adnexae: No adnexal masses.  No free fluid IMPRESSION: Nine week intrauterine pregnancy. Fetal heart rate 157 beats per minute. No acute maternal findings. Electronically Signed   By: Charlett Nose M.D.   On: 09/05/2015 18:13   US Ob Transvaginal  09/05/2015  CLINICAL DATA:  Positive pregnancy test EXAM: OBSTETRIC <14 WK [place] AND TRANSVAGINAL OB US TECHNIQUE: Both transabdominal and transvaginal ultrasound examinations were performed for complete evaluation of the gestation as well as the maternal uterus, adnexal regions, and pelvic cul-de-sac. Transvaginal technique was performed to assess early pregnancy. COMPARISON:  None. FINDINGS: Intrauterine gestational sac: Visualized/normal in shape. Yolk sac:  Not visualized Embryo:  Visualized Cardiac Activity: Visualized Heart Rate: 157  bpm MSD: 27.9  mm   Seven w   6  d CRL:  22.8  mm   9 w   0 d                  [place] EDC: 04/08/2016 Subchorionic hemorrhage: None visualized. A small amount of fluid in the cervical canal. Maternal uterus/adnexae: No adnexal masses.  No free fluid IMPRESSION: Nine week intrauterine pregnancy. Fetal heart rate 157 beats per minute. No acute maternal findings. Electronically Signed   By: Charlett Nose M.D.   On: 09/05/2015 18:13   I have personally reviewed and evaluated these images and lab results as part of my medical decision-making.   EKG Interpretation   Date/Time:  Sunday September 05 2015 16:21:06 EDT Ventricular Rate:  73 PR Interval:  115 QRS Duration: 92 QT Interval:  411 QTC Calculation: 453 R  Axis:   59 Text Interpretation:  Sinus rhythm Borderline short PR interval Baseline  wander in lead(s) V5 No significant change since last tracing Confirmed by  [facility]  MD, Linzy Darling ([id number])  on 09/05/2015 4:34:47 PM      MDM   Final diagnoses:  Asthma exacerbation  CAP (community acquired pneumonia)  First trimester pregnancy    Patient is a 30 year old female with a history of rheumatoid arthritis, lupus, morbid obesity, recurrent pneumonia, home oxygen therapy, PE on Xarelto intermittently, asthma who presents today with a one-week history of URI symptoms with 3 days of wheezing and fever starting yesterday. She's also complaining of diffuse body aches.  Patient is tachypnea here but vital signs are otherwise stable. She has diffuse wheezing and decreased breath sounds. Her x-ray today shows no substantial change in nearly 2 months with a focal upper lobe opacity. Concerned that patient has persistent infection versus other lung disease. CT was ordered to further evaluate. Also patient has a history of PE and is known to not be taking her Xarelto regularly although today she states she has been taking it every day. Patient will also be checked for the flu. She also may be having an asthma exacerbation and she was given albuterol, Atrovent and prednisone. She has been on prednisone for some time but she was given a burst. CBC, BMP, hCG and CT pending  4:50 PM Patient's hCG came back greater than 2000. CBC and BMP unchanged. Bedside ultrasound unable to visualize an IUP. Patient sent for formal transvaginal ultrasound. She does not remember when her last period was.  7:14 PM U/S showed 9 week pregnancy without complication.  Pt also has CAP.  Will treat with rocephin and azithro.  Pt desires to abort the baby however discussed treatment of her pulm issues at this time.  Will be admitted.  Gwyneth Sprout, MD 09/05/15 [phone]

## 2015-09-05 NOTE — H&P (Signed)
[facility] History and Physical  Donna Hall PYY:[id number] DOB: [birth year] DOA: 09/05/2015  Referring physician: Gwyneth Sprout, MD PCP: Lora Paula, MD   Chief Complaint: Shortness of breath.  HPI: Donna Hall is a 30 y.o. female with a past medical history of chronic pain syndrome, obesity, chronic steroid use, systemic lupus erythematosus, rheumatoid arthritis, hypertension, gestational diabetes, depression, obstructive sleep apnea, interstitial lung disease on home oxygen, pulmonary embolus seen, history of pneumonia in the past who comes to emergency department with complaints of fever, chills, fatigue, dyspnea, productive cough and wheezing for 3 days.  Per patient, a few days ago she started having generalized body aches, chills, non- productive cough and fatigue. She states that the following day symptoms were worse, cough became productive, she started having wheezing and increased dyspnea from baseline. No travel history, with possible sick contacts. She was admitted for 2 days back in January for community-acquired pneumonia.  When seen in the emergency department, the patient was initially tachycardic, tachypneic, hypoxic in the high 80s, but she was off her nasal cannula oxygen.  This improved improved significantly once supplemental oxygen was restored again. Workup in the emergency department reveals anemia, unchanged chest x-ray and positivehCG, Beta Chain, Quant. Influenza PCR was negative.   Review of Systems:  Constitutional: Positive fever, chills, fatigue.  No weight loss, night sweats. HEENT:  Positive headache, sinus and nasal congestion, occasional rhinorrhea, mild sore throat.  Cardio-vascular:  Positive dizziness, palpitations  No chest pain, Orthopnea, PND, swelling in lower extremities, anasarca, GI:  No heartburn, indigestion, abdominal pain, nausea, vomiting, diarrhea, change in bowel habits, loss of appetite  Resp:    Positive dyspnea, productive cough, wheezing. No hemoptysis. Skin:  no rash or lesions.  GU:  no dysuria, change in color of urine, no urgency or frequency. No flank pain.  Musculoskeletal:  Positive myalgias, arthralgias and decreased range of motion. Frequent back pain.  Psych:  No change in mood or affect. No depression or anxiety. No memory loss.   Past Medical History  Diagnosis Date  . Psoriasis 2010    as a child  . Chronic pain disorder   . Obesity   . Chronic steroid use 2010  . Lupus (HCC) 2000  . Lupus (systemic lupus erythematosus) (HCC) 2010  . Rheumatoid arthritis(714.0) 2010  . Hypertension 2010  . Pneumonia "several times"  . Interstitial lung disease (HCC)     [name] 05/15/2014  . On home oxygen therapy     "2L; 24/7" (05/15/2014)  . Pulmonary embolism (HCC)     hx/notes 05/15/2014  . History of blood transfusion     "because white cells ate the red cells"  . Headache     "just when I have the pain from aching bones and psorasis and/or lupus symptoms" (05/15/2104)  . 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 Asthma   . Gestational diabetes 2006  . Depression   . Sleep apnea    Past Surgical History  Procedure Laterality Date  . Thigh / knee soft tissue biopsy Right 2011    thigh  . Cesarean section  2010   Social History:  reports that she has never smoked. She has never used smokeless tobacco. She reports that she does not drink alcohol or use illicit drugs.  No Known Allergies  Family History  Problem Relation Age of Onset  . Diabetes Mother   . Hypertension Mother   .  Cancer Neg Hx   . Early death Neg Hx   . Heart disease Neg Hx     Prior to Admission medications   Medication Sig Start Date End Date Taking? Authorizing Provider  azaTHIOprine (IMURAN) 50 MG tablet Take 5 tablets (250 mg total) by mouth daily. 06/24/15  Yes Josalyn Funches, MD  mometasone-formoterol (DULERA)  100-5 MCG/ACT AERO Take 2 puffs first thing in am and then another 2 puffs about 12 hours later. Patient taking differently: Inhale 2 puffs into the lungs 2 (two) times daily. Take 2 puffs first thing in am and then another 2 puffs about 12 hours later. 04/03/15  Yes Osvaldo Shipper, MD  predniSONE (DELTASONE) 10 MG tablet 6 tabs, 5 days, 5 tabs, 5 days,4tabs , 5 days, 3 tabs, 5 days, 2 tabs, 5 days 1 tab , 5 days, 1/2 tab  5 days Patient taking differently: Take 5-60 mg by mouth daily with breakfast. 6 tabs, 5 days, 5 tabs, 5 days,4tabs , 5 days, 3 tabs, 5 days, 2 tabs, 5 days 1 tab , 5 days, 1/2 tab  5 days 07/14/15  Yes Richarda Overlie, MD  rivaroxaban (XARELTO) 20 MG TABS tablet Take 1 tablet (20 mg total) by mouth daily with supper. Patient taking differently: Take 20 mg by mouth every morning.  08/26/15  Yes Jaclyn Shaggy, MD  traMADol (ULTRAM) 50 MG tablet Take 1 tablet (50 mg total) by mouth every 8 (eight) hours as needed. 07/27/15  Yes Jaclyn Shaggy, MD  albuterol (PROVENTIL HFA;VENTOLIN HFA) 108 (90 BASE) MCG/ACT inhaler Inhale 2 puffs into the lungs every 4 (four) hours as needed for wheezing or shortness of breath. Patient not taking: Reported on 08/26/2015 04/03/15   Osvaldo Shipper, MD  cefdinir (OMNICEF) 300 MG capsule Take 1 capsule (300 mg total) by mouth 2 (two) times daily. Patient not taking: Reported on 08/26/2015 07/14/15   Richarda Overlie, MD  CONDYLOX 0.5 % gel Apply topically 2 (two) times daily. Patient not taking: Reported on 05/18/2015 04/09/15   Jaclyn Shaggy, MD  guaiFENesin (MUCINEX) 600 MG 12 hr tablet Take 2 tablets (1,200 mg total) by mouth 2 (two) times daily. Patient not taking: Reported on 07/27/2015 07/14/15   Richarda Overlie, MD  hydroxychloroquine (PLAQUENIL) 200 MG tablet Take 1 tablet (200 mg total) by mouth 2 (two) times daily. Patient not taking: Reported on 07/12/2015 10/26/14   Adrian Blackwater Moding, MD  hydrOXYzine (ATARAX/VISTARIL) 10 MG tablet Take 1 tablet (10 mg total) by mouth  every 8 (eight) hours as needed for itching. Patient not taking: Reported on 08/26/2015 02/05/15   Vivianne Master, PA-C  ipratropium (ATROVENT) 0.02 % nebulizer solution Take 2.5 mLs (0.5 mg total) by nebulization once. Patient not taking: Reported on 05/18/2015 05/03/15   Jaclyn Shaggy, MD  lidocaine (XYLOCAINE) 5 % ointment Apply 1 application topically as needed. Patient not taking: Reported on 05/18/2015 04/07/15   Ricarda Frame, MD   Physical Exam: Filed Vitals:   09/05/15 1636 09/05/15 1824 09/05/15 1900 09/05/15 1930  BP: 101/54 111/74 101/63 107/74  Pulse: 80 111 93 93  Temp:      Resp: 24 19 22 23   SpO2: 100% 97% 98% 93%    Wt Readings from Last 3 Encounters:  08/26/15 126.463 kg (278 lb 12.8 oz)  07/27/15 112.038 kg (247 lb)  07/13/15 130.182 kg (287 lb)    General:  Morbidly obese. Appears mildly anxious and chronically ill. Eyes: PERRL, normal lids, irises & conjunctiva ENT: grossly normal hearing,  lips & tongue Neck: no LAD, masses or thyromegaly Cardiovascular: Tachycardic, no m/r/g. No LE edema. Telemetry:  Sinus tachycardia at 108 bpm. Respiratory: Decreased breath sounds with wheezing and rhonchi bilaterally.                       No accessory muscle use. Abdomen: Obese, BS +, soft, nontender. Skin: no rash or induration seen on limited exam Musculoskeletal: grossly normal tone BUE/BLE Psychiatric: grossly normal mood and affect, speech fluent and appropriate Neurologic: AAOx3, grossly non-focal.          Labs on Admission:  Basic Metabolic Panel:  Recent Labs Lab 09/05/15 1536  NA 140  K 4.4  CL 102  CO2 25  GLUCOSE 128*  BUN 13  CREATININE 0.52  CALCIUM 8.7*   CBC:  Recent Labs Lab 09/05/15 1536  WBC 6.7  NEUTROABS 4.8  HGB 11.0*  HCT 39.3  MCV 81.2  PLT 256    BNP (last 3 results)  Recent Labs  09/11/14 1407 11/19/14 1349  BNP 12.0 8.7    Radiological Exams on Admission: Dg Chest 2 View  09/05/2015  CLINICAL DATA:  Cough  with body aches for 5 days EXAM: CHEST  2 VIEW COMPARISON:  07/12/2015.  04/01/2015 FINDINGS: Low volume film. The bilateral diffuse interstitial lung disease is again noted. There is patchy airspace opacity at the left base, stable to slightly progressed in the interval. The flame shaped opacity in the left upper lung persists without interval change. Cardiopericardial silhouette is at upper limits of normal for size. The visualized bony structures of the thorax are intact. IMPRESSION: No substantial change in the nearly 2 month interval since the prior study but left lung airspace disease and focal upper lobe opacity is new since 04/01/2015. CT chest may prove helpful to further evaluate. Electronically Signed   By: Kennith Center M.D.   On: 09/05/2015 15:10   US Ob Comp Less 14 Wks  09/05/2015  CLINICAL DATA:  Positive pregnancy test EXAM: OBSTETRIC <14 WK [place] AND TRANSVAGINAL OB US TECHNIQUE: Both transabdominal and transvaginal ultrasound examinations were performed for complete evaluation of the gestation as well as the maternal uterus, adnexal regions, and pelvic cul-de-sac. Transvaginal technique was performed to assess early pregnancy. COMPARISON:  None. FINDINGS: Intrauterine gestational sac: Visualized/normal in shape. Yolk sac:  Not visualized Embryo:  Visualized Cardiac Activity: Visualized Heart Rate: 157  bpm MSD: 27.9  mm   Seven w   6  d CRL:  22.8  mm   9 w   0 d                  [place] EDC: 04/08/2016 Subchorionic hemorrhage: None visualized. A small amount of fluid in the cervical canal. Maternal uterus/adnexae: No adnexal masses.  No free fluid IMPRESSION: Nine week intrauterine pregnancy. Fetal heart rate 157 beats per minute. No acute maternal findings. Electronically Signed   By: Charlett Nose M.D.   On: 09/05/2015 18:13   US Ob Transvaginal  09/05/2015  CLINICAL DATA:  Positive pregnancy test EXAM: OBSTETRIC <14 WK [place] AND TRANSVAGINAL OB US TECHNIQUE: Both transabdominal and transvaginal  ultrasound examinations were performed for complete evaluation of the gestation as well as the maternal uterus, adnexal regions, and pelvic cul-de-sac. Transvaginal technique was performed to assess early pregnancy. COMPARISON:  None. FINDINGS: Intrauterine gestational sac: Visualized/normal in shape. Yolk sac:  Not visualized Embryo:  Visualized Cardiac Activity: Visualized Heart Rate: 157  bpm MSD: 27.9  mm   Seven w   6  d CRL:  22.8  mm   9 w   0 d                  [place] EDC: 04/08/2016 Subchorionic hemorrhage: None visualized. A small amount of fluid in the cervical canal. Maternal uterus/adnexae: No adnexal masses.  No free fluid IMPRESSION: Nine week intrauterine pregnancy. Fetal heart rate 157 beats per minute. No acute maternal findings. Electronically Signed   By: Charlett Nose M.D.   On: 09/05/2015 18:13  Echocardiogram: 07/10/2014  LV EF: 55% -  60%  ------------------------------------------------------------------- Indications:   Dyspnea 786.09.  ------------------------------------------------------------------- Study Conclusions  - Procedure narrative: Technically difficult study with reduced echo windows. - Left ventricle: The cavity size was normal. Wall thickness was normal. Systolic function was normal. The estimated ejection fraction was in the range of 55% to 60%. The study is not technically sufficient to allow evaluation of LV diastolic function. - Left atrium: The atrium was normal in size. - Right ventricle: The cavity size was mildly dilated. Wall thickness was normal. Systolic function was normal. - Right atrium: The atrium was normal in size. - Systemic veins: The IVC was not well visualized.  Impressions:  - Compared to the last echo in 2009, there is little change.  EKG: Independently reviewed.  Vent. rate 73 BPM PR interval 115 ms QRS duration 92 ms QT/QTc 411/453 ms P-R-T axes 3 59 59 Sinus rhythm Borderline short PR interval Baseline  wander in lead(s) V5 No significant change since last tracing  Assessment/Plan Principal Problem:   CAP (community acquired pneumonia) Admit to telemetry/inpatient. Continue supplemental oxygen. Continue bronchodilators as needed. Continue IV antibiotics. Continue glucocorticoids. Follow-up blood cultures and sensitivity. Check a sputum culture and sensitivity.  Active Problems:   Rheumatoid arthritis (HCC)   Systemic lupus erythematosus (HCC) I will switch prednisone to methylprednisolone for now. Analgesics as needed. Hold Imuran until the patient talks to GYN.    Obesity, Class III, BMI 40-49.9 (morbid obesity) (HCC) Needs drastic lifestyle modifications, since given her medical history, surgery may not be an option.      Essential hypertension, benign Stable. Continue low-sodium diet. Monitor blood pressure periodically.    Anxiety and depression Continue hydroxyzine as needed for anxiety.    History of pulmonary embolism Hold Xarelto. Lovenox SQ per pharmacy until pregnancy discussed with GYN.    Pregnancy at early stage.   Nine week intrauterine pregnancy Per patient, she does now wish to carry the pregnancy to term given her poor state of health and in need of disease modifying immunosuppressants and anticoagulants that she is taking. Hold Imuran and Xarelto. Continue glucocorticoids and anticoagulation with Lovenox. Consult GYN in a.m.   Code Status: Full code. DVT Prophylaxis: On Full dose Lovenox.  Family Communication:  Disposition Plan: Admit for IV antibiotic therapy, bronchodilators, glucocorticoids, GYN evaluation in AM.  Time spent: Over 70 minutes were spent during the process of this admission.   Bobette Mo [facility] Pager [phone].

## 2015-09-05 NOTE — Progress Notes (Signed)
ANTICOAGULATION CONSULT NOTE - Initial Consult  Pharmacy Consult for Lovenox Indication: h/o recurrent PE w/ SLE  No Known Allergies  Patient Measurements: Weight: 126.5kg  Vital Signs: Temp: 98.3 F (36.8 C) (03/12 1339) BP: 101/55 mmHg (03/12 2230) Pulse Rate: 71 (03/12 2230)  Labs:  Recent Labs  09/05/15 1536  HGB 11.0*  HCT 39.3  PLT 256  CREATININE 0.52    Estimated Creatinine Clearance: 133.1 mL/min (by C-G formula based on Cr of 0.52).   Medical History: Past Medical History  Diagnosis Date  . Psoriasis 2010    as a child  . Chronic pain disorder   . Obesity   . Chronic steroid use 2010  . Lupus (HCC) 2000  . Lupus (systemic lupus erythematosus) (HCC) 2010  . Rheumatoid arthritis(714.0) 2010  . Hypertension 2010  . Pneumonia "several times"  . Interstitial lung disease (HCC)     [name] 05/15/2014  . On home oxygen therapy     "2L; 24/7" (05/15/2014)  . Pulmonary embolism (HCC)     hx/notes 05/15/2014  . History of blood transfusion     "because white cells ate the red cells"  . Headache     "just when I have the pain from aching bones and psorasis and/or lupus symptoms" (05/15/2104)  . 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 Asthma   . Gestational diabetes 2006  . Depression   . Sleep apnea      Assessment: 30yo female c/ cough and generalized body aches, admitted for CAP, incidentally found to be pregnant, to transition from Xarelto to LMWH; pt is known to be inconsistent w/ Xarelto but states she has been taking it daily now, last dose 3/12.  Goal of Therapy:  Anti-Xa level 0.6-1 units/ml 4hrs after LMWH dose given Monitor platelets by anticoagulation protocol: Yes   Plan:  Will begin Lovenox 130mg  SQ Q12H starting tomorrow am and monitor CBC.  , PharmD, BCPS  09/05/2015,11:04 PM

## 2015-09-05 NOTE — ED Notes (Signed)
Pt here for cough and body aches. Pt wheezing at triage. sts some SOB,.

## 2015-09-06 ENCOUNTER — Other Ambulatory Visit (HOSPITAL_COMMUNITY): Payer: Self-pay

## 2015-09-06 DIAGNOSIS — F418 Other specified anxiety disorders: Secondary | ICD-10-CM

## 2015-09-06 DIAGNOSIS — Z3490 Encounter for supervision of normal pregnancy, unspecified, unspecified trimester: Secondary | ICD-10-CM

## 2015-09-06 DIAGNOSIS — J45901 Unspecified asthma with (acute) exacerbation: Secondary | ICD-10-CM | POA: Insufficient documentation

## 2015-09-06 LAB — CBC WITH DIFFERENTIAL/PLATELET
BASOS ABS: 0 10*3/uL (ref 0.0–0.1)
Basophils Relative: 0 %
EOS ABS: 0 10*3/uL (ref 0.0–0.7)
EOS PCT: 0 %
HCT: 40.1 % (ref 36.0–46.0)
Hemoglobin: 11.1 g/dL — ABNORMAL LOW (ref 12.0–15.0)
LYMPHS ABS: 0.7 10*3/uL (ref 0.7–4.0)
LYMPHS PCT: 13 %
MCH: 22.6 pg — AB (ref 26.0–34.0)
MCHC: 27.7 g/dL — ABNORMAL LOW (ref 30.0–36.0)
MCV: 81.7 fL (ref 78.0–100.0)
Monocytes Absolute: 0.1 10*3/uL (ref 0.1–1.0)
Monocytes Relative: 2 %
NEUTROS ABS: 4.7 10*3/uL (ref 1.7–7.7)
NEUTROS PCT: 85 %
PLATELETS: 269 10*3/uL (ref 150–400)
RBC: 4.91 MIL/uL (ref 3.87–5.11)
RDW: 16.8 % — AB (ref 11.5–15.5)
WBC: 5.4 10*3/uL (ref 4.0–10.5)

## 2015-09-06 LAB — STREP PNEUMONIAE URINARY ANTIGEN: Strep Pneumo Urinary Antigen: POSITIVE — AB

## 2015-09-06 LAB — COMPREHENSIVE METABOLIC PANEL
ALT: 16 U/L (ref 14–54)
AST: 22 U/L (ref 15–41)
Albumin: 3.4 g/dL — ABNORMAL LOW (ref 3.5–5.0)
Alkaline Phosphatase: 52 U/L (ref 38–126)
Anion gap: 12 (ref 5–15)
BUN: 6 mg/dL (ref 6–20)
CHLORIDE: 102 mmol/L (ref 101–111)
CO2: 25 mmol/L (ref 22–32)
CREATININE: 0.36 mg/dL — AB (ref 0.44–1.00)
Calcium: 8.6 mg/dL — ABNORMAL LOW (ref 8.9–10.3)
GFR calc non Af Amer: >60 mL/min (ref >60)
Glucose, Bld: 120 mg/dL — ABNORMAL HIGH (ref 65–99)
POTASSIUM: 4.4 mmol/L (ref 3.5–5.1)
SODIUM: 139 mmol/L (ref 135–145)
Total Bilirubin: 0.4 mg/dL (ref 0.3–1.2)
Total Protein: 6.5 g/dL (ref 6.5–8.1)

## 2015-09-06 LAB — GLUCOSE, CAPILLARY
GLUCOSE-CAPILLARY: 135 mg/dL — AB (ref 65–99)
GLUCOSE-CAPILLARY: 115 mg/dL — AB (ref 65–99)

## 2015-09-06 LAB — CBG MONITORING, ED: Glucose-Capillary: 123 mg/dL — ABNORMAL HIGH (ref 65–99)

## 2015-09-06 MED ORDER — SODIUM CHLORIDE 0.9 % IV SOLN
INTRAVENOUS | Status: AC
  Administered 2015-09-06: 11:00:00 via INTRAVENOUS

## 2015-09-06 MED ORDER — DEXTROSE 5 % IV SOLN
500.0000 mg | INTRAVENOUS | Status: DC
  Administered 2015-09-06 – 2015-09-07 (×2): 500 mg via INTRAVENOUS
  Filled 2015-09-06 (×3): qty 500

## 2015-09-06 MED ORDER — DEXTROSE 5 % IV SOLN
1.0000 g | INTRAVENOUS | Status: DC
  Filled 2015-09-06: qty 10

## 2015-09-06 MED ORDER — TRAMADOL HCL 50 MG PO TABS
50.0000 mg | ORAL_TABLET | Freq: Four times a day (QID) | ORAL | Status: DC | PRN
  Administered 2015-09-06 – 2015-09-09 (×10): 50 mg via ORAL
  Filled 2015-09-06 (×10): qty 1

## 2015-09-06 MED ORDER — ALBUTEROL SULFATE (2.5 MG/3ML) 0.083% IN NEBU: 2.5000 mg | INHALATION_SOLUTION | RESPIRATORY_TRACT | Status: DC | PRN

## 2015-09-06 MED ORDER — SODIUM CHLORIDE 0.9 % IV BOLUS (SEPSIS)
250.0000 mL | Freq: Once | INTRAVENOUS | Status: AC
  Administered 2015-09-06: 250 mL via INTRAVENOUS

## 2015-09-06 MED ORDER — METHYLPREDNISOLONE SODIUM SUCC 125 MG IJ SOLR
80.0000 mg | Freq: Four times a day (QID) | INTRAMUSCULAR | Status: DC
  Administered 2015-09-06 – 2015-09-09 (×14): 80 mg via INTRAVENOUS
  Filled 2015-09-06 (×13): qty 2

## 2015-09-06 MED ORDER — DEXTROSE 5 % IV SOLN
500.0000 mg | INTRAVENOUS | Status: DC
  Filled 2015-09-06: qty 500

## 2015-09-06 MED ORDER — DEXTROSE 5 % IV SOLN
1.0000 g | INTRAVENOUS | Status: DC
  Administered 2015-09-06 – 2015-09-07 (×2): 1 g via INTRAVENOUS
  Filled 2015-09-06 (×3): qty 10

## 2015-09-06 MED ORDER — DEXTROSE 5 % IV SOLN: 500.0000 mg | INTRAVENOUS | Status: DC

## 2015-09-06 MED ORDER — OXYCODONE HCL 5 MG PO TABS
5.0000 mg | ORAL_TABLET | Freq: Once | ORAL | Status: AC
Start: 2015-09-06 — End: 2015-09-06
  Administered 2015-09-06: 5 mg via ORAL
  Filled 2015-09-06: qty 1

## 2015-09-06 NOTE — Progress Notes (Signed)
MEDICATION RELATED CONSULT NOTE - INITIAL   Pharmacy Consult for review medications Indication: pregnant patient  No Known Allergies  Patient Measurements:    Vital Signs: BP: 118/75 mmHg (03/13 0930) Pulse Rate: 71 (03/13 0930) Intake/Output from previous day:   Intake/Output from this shift:    Labs:  Recent Labs  09/05/15 1536 09/05/15 2047 09/06/15 0644  WBC 6.7  --  5.4  HGB 11.0*  --  11.1*  HCT 39.3  --  40.1  PLT 256  --  269  CREATININE 0.52  --  0.36*  MG  --  1.8  --   PHOS  --  3.7  --   ALBUMIN  --   --  3.4*  PROT  --   --  6.5  AST  --   --  22  ALT  --   --  16  ALKPHOS  --   --  52  BILITOT  --   --  0.4   Estimated Creatinine Clearance: 133.1 mL/min (by C-G formula based on Cr of 0.36).   Microbiology: No results found for this or any previous visit (from the past 720 hour(s)).  Medical History: Past Medical History  Diagnosis Date  . Psoriasis 2010    as a child  . Chronic pain disorder   . Obesity   . Chronic steroid use 2010  . Lupus (HCC) 2000  . Lupus (systemic lupus erythematosus) (HCC) 2010  . Rheumatoid arthritis(714.0) 2010  . Hypertension 2010  . Pneumonia "several times"  . Interstitial lung disease (HCC)     [name] 05/15/2014  . On home oxygen therapy     "2L; 24/7" (05/15/2014)  . Pulmonary embolism (HCC)     hx/notes 05/15/2014  . History of blood transfusion     "because white cells ate the red cells"  . Headache     "just when I have the pain from aching bones and psorasis and/or lupus symptoms" (05/15/2104)  . 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 Asthma   . Gestational diabetes 2006  . Depression   . Sleep apnea     Assessment: 29 yof here with CAP, incidentally found to be pregnant. Pharmacy consulted to review pta and inpatient medications for risk in pregnancy. Current inpatient and PTA medications listed in pregnancy categories below. To  consult with GYN prior to adding back Imuran (category D) per MD note.  Pregnancy Category B: Azithromycin Ceftriaxone Ipratropium Lovenox  Pregnancy Category C: Albuterol Tramadol Xarelto switched lovenox inpatient (preferred in pregnancy) Dulera (PTA - not yet ordered) Methylprednisolone Prednisone (PTA med - Category C/D - product specific)  Pregnancy Category D: Imuran (pta - not yet ordered)   Goal of Therapy:  Safe medication practices in pregnant patient  Plan:  Continue to follow for medication risk in pregnancy  Babs Bertin, PharmD, [facility] Clinical Pharmacist Pager [phone] 09/06/2015 10:55 AM

## 2015-09-06 NOTE — ED Notes (Signed)
Patient CBG was 123, Nurse was informed.

## 2015-09-06 NOTE — ED Notes (Signed)
Paged admitting to Amy, RN.

## 2015-09-06 NOTE — Progress Notes (Addendum)
[facility] PROGRESS NOTE  Donna Hall UGQ:[id number] DOB: 1985/10/13 DOA: 09/05/2015 PCP: Lora Paula, MD  Length of stay: 1   Assessment/Plan: Principal Problem:   CAP (community acquired pneumonia) Active Problems:   Rheumatoid arthritis (HCC)   Obesity, Class III, BMI 40-49.9 (morbid obesity) (HCC)   Systemic lupus erythematosus (HCC)   Essential hypertension, benign   Anxiety and depression   History of pulmonary embolism   Pregnancy at early stage    Brief summary Donna Hall is a 30 y.o. female with a past medical history of chronic pain syndrome, obesity, chronic steroid use, systemic lupus erythematosus, rheumatoid arthritis, hypertension, gestational diabetes, depression, obstructive sleep apnea, interstitial lung disease on home oxygen, pulmonary embolus seen, history of pneumonia in the past who comes to emergency department with complaints of fever, chills, fatigue, dyspnea, productive cough and wheezing for 3 days.  Per patient, a few days ago she started having generalized body aches, chills, non- productive cough and fatigue. She states that the following day symptoms were worse, cough became productive, she started having wheezing and increased dyspnea from baseline. No travel history, with possible sick contacts. She was admitted for 2 days back in January for community-acquired pneumonia.  When seen in the emergency department, the patient was initially tachycardic, tachypneic, hypoxic in the high 80s, but she was off her nasal cannula oxygen. This improved improved significantly once supplemental oxygen was restored again. Workup in the emergency department reveals anemia, unchanged chest x-ray and positivehCG, Beta Chain, Quant. Influenza PCR was negative.  Assessment and plan CAP (community acquired pneumonia) Admit to telemetry/inpatient. Continue supplemental oxygen. Continue bronchodilators as needed. Continue IV  antibiotics. Continue glucocorticoids. Positive for strep pneumo antigen Influenza panel negative, follow blood culture results     Rheumatoid arthritis (HCC)  Systemic lupus erythematosus (HCC) I will switch prednisone to methylprednisolone for now. Analgesics as needed. Hold Imuran until the patient talks to GYN.   Obesity, Class III, BMI 40-49.9 (morbid obesity) (HCC) Needs drastic lifestyle modifications, since given her medical history, surgery may not be an option.    Essential hypertension, benign Stable. Continue low-sodium diet. Monitor blood pressure periodically.   Anxiety and depression Continue hydroxyzine as needed for anxiety.   History of pulmonary embolism Hold Xarelto. Lovenox SQ per pharmacy until pregnancy discussed with GYN.   Pregnancy at early stage.  Nine week intrauterine pregnancy Per patient, she does now wish to carry the pregnancy to term given her poor state of health and in need of disease modifying immunosuppressants and anticoagulants that she is taking. Hold Imuran and Xarelto. Continue glucocorticoids and anticoagulation with Lovenox. Pharmacy consultation to review medications contraindicated in first trimester, active and home med list Case mx requested to set up OB appointment next one week to discuss options for termination of pregnancy      DVT prophylaxsis Lovenox  Code Status:      Code Status Orders        Start     Ordered   09/06/15 0203  Full code   Continuous     09/06/15 0202    Code Status History      Family Communication: Discussed in detail with the patient using interpreter line , all imaging results, lab results explained to the patient   Disposition Plan:  Patient will be admitted to telemetry    Consultants:  Pharmacy consultation to review medications    Procedures:  None    Antibiotics: Anti-infectives  Start     Dose/Rate Route Frequency Ordered Stop   09/06/15 0202   cefTRIAXone (ROCEPHIN) 1 g in dextrose 5 % 50 mL IVPB     1 g 100 mL/hr over 30 Minutes Intravenous Every 24 hours 09/06/15 0202 09/12/15 1759   09/06/15 0202  azithromycin (ZITHROMAX) 500 mg in dextrose 5 % 250 mL IVPB     500 mg 250 mL/hr over 60 Minutes Intravenous Every 24 hours 09/06/15 0202 09/12/15 1959   09/05/15 1845  cefTRIAXone (ROCEPHIN) 1 g in dextrose 5 % 50 mL IVPB     1 g 100 mL/hr over 30 Minutes Intravenous  Once 09/05/15 1837 09/05/15 2018   09/05/15 1845  azithromycin (ZITHROMAX) 500 mg in dextrose 5 % 250 mL IVPB     500 mg 250 mL/hr over 60 Minutes Intravenous  Once 09/05/15 1837 09/05/15 2018         HPI/Subjective: Denies any chest pain or shortness of breath this morning  Objective: Filed Vitals:   09/06/15 0730 09/06/15 0854 09/06/15 0900 09/06/15 0930  BP: 108/68 118/67 115/68 118/75  Pulse: 79 67 65 71  Temp:      Resp: 22 20 21 23   SpO2: 96% 98% 97% 97%   No intake or output data in the 24 hours ending 09/06/15 1029  Exam:  General: No acute respiratory distress Lungs: Clear to auscultation bilaterally without wheezes or crackles Cardiovascular: Regular rate and rhythm without murmur gallop or rub normal S1 and S2 Abdomen: Nontender, nondistended, soft, bowel sounds positive, no rebound, no ascites, no appreciable mass Extremities: No significant cyanosis, clubbing, or edema bilateral lower extremities     Data Review   Micro Results No results found for this or any previous visit (from the past 240 hour(s)).  Radiology Reports Dg Chest 2 View  09/05/2015  CLINICAL DATA:  Cough with body aches for 5 days EXAM: CHEST  2 VIEW COMPARISON:  07/12/2015.  04/01/2015 FINDINGS: Low volume film. The bilateral diffuse interstitial lung disease is again noted. There is patchy airspace opacity at the left base, stable to slightly progressed in the interval. The flame shaped opacity in the left upper lung persists without interval change.  Cardiopericardial silhouette is at upper limits of normal for size. The visualized bony structures of the thorax are intact. IMPRESSION: No substantial change in the nearly 2 month interval since the prior study but left lung airspace disease and focal upper lobe opacity is new since 04/01/2015. CT chest may prove helpful to further evaluate. Electronically Signed   By: 06/01/2015 M.D.   On: 09/05/2015 15:10   11/05/2015 Ob Comp Less 14 Wks  09/05/2015  CLINICAL DATA:  Positive pregnancy test EXAM: OBSTETRIC <14 WK 11/05/2015 AND TRANSVAGINAL OB US TECHNIQUE: Both transabdominal and transvaginal ultrasound examinations were performed for complete evaluation of the gestation as well as the maternal uterus, adnexal regions, and pelvic cul-de-sac. Transvaginal technique was performed to assess early pregnancy. COMPARISON:  None. FINDINGS: Intrauterine gestational sac: Visualized/normal in shape. Yolk sac:  Not visualized Embryo:  Visualized Cardiac Activity: Visualized Heart Rate: 157  bpm MSD: 27.9  mm   Seven w   6  d CRL:  22.8  mm   9 w   0 d                  [place] EDC: 04/08/2016 Subchorionic hemorrhage: None visualized. A small amount of fluid in the cervical canal. Maternal uterus/adnexae: No adnexal masses.  No free  fluid IMPRESSION: Nine week intrauterine pregnancy. Fetal heart rate 157 beats per minute. No acute maternal findings. Electronically Signed   By: Charlett Nose M.D.   On: 09/05/2015 18:13   US Ob Transvaginal  09/05/2015  CLINICAL DATA:  Positive pregnancy test EXAM: OBSTETRIC <14 WK [place] AND TRANSVAGINAL OB US TECHNIQUE: Both transabdominal and transvaginal ultrasound examinations were performed for complete evaluation of the gestation as well as the maternal uterus, adnexal regions, and pelvic cul-de-sac. Transvaginal technique was performed to assess early pregnancy. COMPARISON:  None. FINDINGS: Intrauterine gestational sac: Visualized/normal in shape. Yolk sac:  Not visualized Embryo:  Visualized Cardiac  Activity: Visualized Heart Rate: 157  bpm MSD: 27.9  mm   Seven w   6  d CRL:  22.8  mm   9 w   0 d                  [place] EDC: 04/08/2016 Subchorionic hemorrhage: None visualized. A small amount of fluid in the cervical canal. Maternal uterus/adnexae: No adnexal masses.  No free fluid IMPRESSION: Nine week intrauterine pregnancy. Fetal heart rate 157 beats per minute. No acute maternal findings. Electronically Signed   By: Charlett Nose M.D.   On: 09/05/2015 18:13     CBC  Recent Labs Lab 09/05/15 1536 09/06/15 0644  WBC 6.7 5.4  HGB 11.0* 11.1*  HCT 39.3 40.1  PLT 256 269  MCV 81.2 81.7  MCH 22.7* 22.6*  MCHC 28.0* 27.7*  RDW 16.9* 16.8*  LYMPHSABS 1.4 0.7  MONOABS 0.4 0.1  EOSABS 0.0 0.0  BASOSABS 0.0 0.0    Chemistries   Recent Labs Lab 09/05/15 1536 09/05/15 2047 09/06/15 0644  NA 140  --  139  K 4.4  --  4.4  CL 102  --  102  CO2 25  --  25  GLUCOSE 128*  --  120*  BUN 13  --  6  CREATININE 0.52  --  0.36*  CALCIUM 8.7*  --  8.6*  MG  --  1.8  --   AST  --   --  22  ALT  --   --  16  ALKPHOS  --   --  52  BILITOT  --   --  0.4   ------------------------------------------------------------------------------------------------------------------ estimated creatinine clearance is 133.1 mL/min (by C-G formula based on Cr of 0.36). ------------------------------------------------------------------------------------------------------------------ No results for input(s): HGBA1C in the last 72 hours. ------------------------------------------------------------------------------------------------------------------ No results for input(s): CHOL, HDL, LDLCALC, TRIG, CHOLHDL, LDLDIRECT in the last 72 hours. ------------------------------------------------------------------------------------------------------------------ No results for input(s): TSH, T4TOTAL, T3FREE, THYROIDAB in the last 72 hours.  Invalid input(s):  FREET3 ------------------------------------------------------------------------------------------------------------------ No results for input(s): VITAMINB12, FOLATE, FERRITIN, TIBC, IRON, RETICCTPCT in the last 72 hours.  Coagulation profile No results for input(s): INR, PROTIME in the last 168 hours.  No results for input(s): DDIMER in the last 72 hours.  Cardiac Enzymes No results for input(s): CKMB, TROPONINI, MYOGLOBIN in the last 168 hours.  Invalid input(s): CK ------------------------------------------------------------------------------------------------------------------ Invalid input(s): POCBNP   CBG:  Recent Labs Lab 09/06/15 0736  GLUCAP 123*       Studies: Dg Chest 2 View  09/05/2015  CLINICAL DATA:  Cough with body aches for 5 days EXAM: CHEST  2 VIEW COMPARISON:  07/12/2015.  04/01/2015 FINDINGS: Low volume film. The bilateral diffuse interstitial lung disease is again noted. There is patchy airspace opacity at the left base, stable to slightly progressed in the interval. The flame shaped opacity in the left upper  lung persists without interval change. Cardiopericardial silhouette is at upper limits of normal for size. The visualized bony structures of the thorax are intact. IMPRESSION: No substantial change in the nearly 2 month interval since the prior study but left lung airspace disease and focal upper lobe opacity is new since 04/01/2015. CT chest may prove helpful to further evaluate. Electronically Signed   By: Kennith Center M.D.   On: 09/05/2015 15:10   US Ob Comp Less 14 Wks  09/05/2015  CLINICAL DATA:  Positive pregnancy test EXAM: OBSTETRIC <14 WK [place] AND TRANSVAGINAL OB US TECHNIQUE: Both transabdominal and transvaginal ultrasound examinations were performed for complete evaluation of the gestation as well as the maternal uterus, adnexal regions, and pelvic cul-de-sac. Transvaginal technique was performed to assess early pregnancy. COMPARISON:  None.  FINDINGS: Intrauterine gestational sac: Visualized/normal in shape. Yolk sac:  Not visualized Embryo:  Visualized Cardiac Activity: Visualized Heart Rate: 157  bpm MSD: 27.9  mm   Seven w   6  d CRL:  22.8  mm   9 w   0 d                  [place] EDC: 04/08/2016 Subchorionic hemorrhage: None visualized. A small amount of fluid in the cervical canal. Maternal uterus/adnexae: No adnexal masses.  No free fluid IMPRESSION: Nine week intrauterine pregnancy. Fetal heart rate 157 beats per minute. No acute maternal findings. Electronically Signed   By: Charlett Nose M.D.   On: 09/05/2015 18:13   US Ob Transvaginal  09/05/2015  CLINICAL DATA:  Positive pregnancy test EXAM: OBSTETRIC <14 WK [place] AND TRANSVAGINAL OB US TECHNIQUE: Both transabdominal and transvaginal ultrasound examinations were performed for complete evaluation of the gestation as well as the maternal uterus, adnexal regions, and pelvic cul-de-sac. Transvaginal technique was performed to assess early pregnancy. COMPARISON:  None. FINDINGS: Intrauterine gestational sac: Visualized/normal in shape. Yolk sac:  Not visualized Embryo:  Visualized Cardiac Activity: Visualized Heart Rate: 157  bpm MSD: 27.9  mm   Seven w   6  d CRL:  22.8  mm   9 w   0 d                  [place] EDC: 04/08/2016 Subchorionic hemorrhage: None visualized. A small amount of fluid in the cervical canal. Maternal uterus/adnexae: No adnexal masses.  No free fluid IMPRESSION: Nine week intrauterine pregnancy. Fetal heart rate 157 beats per minute. No acute maternal findings. Electronically Signed   By: Charlett Nose M.D.   On: 09/05/2015 18:13      Lab Results  Component Value Date   HGBA1C 5.60 07/27/2015   HGBA1C 5.5 02/23/2014   HGBA1C 5.5 06/15/2013   Lab Results  Component Value Date   MICROALBUR 2.47* 03/07/2012   LDLCALC 64 03/08/2012   CREATININE 0.36* 09/06/2015       Scheduled Meds: . enoxaparin (LOVENOX) injection  130 mg Subcutaneous Q12H  . ipratropium-albuterol  3  mL Nebulization Q6H  . methylPREDNISolone (SOLU-MEDROL) injection  80 mg Intravenous Q6H   Continuous Infusions: . azithromycin    . cefTRIAXone (ROCEPHIN)  IV      Principal Problem:   CAP (community acquired pneumonia) Active Problems:   Rheumatoid arthritis (HCC)   Obesity, Class III, BMI 40-49.9 (morbid obesity) (HCC)   Systemic lupus erythematosus (HCC)   Essential hypertension, benign   Anxiety and depression   History of pulmonary embolism   Pregnancy at early stage  Time spent: 45 minutes   [facility]  [facility] Pager [phone]. If 7PM-7AM, please contact night-coverage at [email or web address], password [facility] 09/06/2015, 10:29 AM  LOS: 1 day

## 2015-09-06 NOTE — ED Notes (Signed)
Discontinued droplet per Dr. Susie Cassette.

## 2015-09-06 NOTE — Hospital Discharge Follow-Up (Signed)
The patient is known to the [facility] and has been followed in the [facility] at the [facility] in the past. She currently has an appointment scheduled with Dr Adrian Blackwater on 09/13/15 @ 1400.  The information was placed on the AVS. Met with the patient this afternoon. Donna Hall, spanish interpreter # [phone], with [employer] assisted. The patient stated that she would like to continue to follow up at the [facility].   She noted that she is not sure if her O2 tanks at home are full and she would like [facility])  to check. This CM has contacted [facility] in the past regarding having the O2 tanks refilled and the patient needs to take them to the [facility] office to be refilled.  The patient said that she would need to find someone to take her to the [facility] office because she does not read or write english.  This CM to check again with [facility] regarding making a home visit to check the status of the oxygen.   The patient then stated that she is pregnant and does not feel that she can keep the baby because of her own medical issues. She said that she needs to speak to the doctor. This CM informed her that she would notify her nurse and the patient was appreciative    This CM spoke to the patient's nurse, Donna Hall, who stated that a referral has been made to [facility]. The patient just found out that she was pregnant when she was in the ED.

## 2015-09-07 ENCOUNTER — Telehealth: Payer: Self-pay

## 2015-09-07 ENCOUNTER — Ambulatory Visit (HOSPITAL_COMMUNITY): Payer: Medicaid Other

## 2015-09-07 ENCOUNTER — Inpatient Hospital Stay (HOSPITAL_COMMUNITY): Payer: Medicaid Other

## 2015-09-07 ENCOUNTER — Other Ambulatory Visit (HOSPITAL_COMMUNITY): Payer: Self-pay

## 2015-09-07 ENCOUNTER — Encounter (HOSPITAL_COMMUNITY): Payer: Self-pay

## 2015-09-07 DIAGNOSIS — I10 Essential (primary) hypertension: Secondary | ICD-10-CM

## 2015-09-07 LAB — COMPREHENSIVE METABOLIC PANEL
ALK PHOS: 49 U/L (ref 38–126)
ALT: 16 U/L (ref 14–54)
ANION GAP: 9 (ref 5–15)
AST: 17 U/L (ref 15–41)
Albumin: 3.1 g/dL — ABNORMAL LOW (ref 3.5–5.0)
BUN: 8 mg/dL (ref 6–20)
CHLORIDE: 104 mmol/L (ref 101–111)
CO2: 27 mmol/L (ref 22–32)
Calcium: 8.7 mg/dL — ABNORMAL LOW (ref 8.9–10.3)
Creatinine, Ser: 0.34 mg/dL — ABNORMAL LOW (ref 0.44–1.00)
GFR calc Af Amer: >60 mL/min (ref >60)
GLUCOSE: 132 mg/dL — AB (ref 65–99)
POTASSIUM: 4.3 mmol/L (ref 3.5–5.1)
Sodium: 140 mmol/L (ref 135–145)
TOTAL PROTEIN: 6 g/dL — AB (ref 6.5–8.1)
Total Bilirubin: 0.3 mg/dL (ref 0.3–1.2)

## 2015-09-07 LAB — LEGIONELLA ANTIGEN, URINE

## 2015-09-07 LAB — GLUCOSE, CAPILLARY
GLUCOSE-CAPILLARY: 124 mg/dL — AB (ref 65–99)
GLUCOSE-CAPILLARY: 115 mg/dL — AB (ref 65–99)

## 2015-09-07 LAB — CBC
HEMATOCRIT: 38.2 % (ref 36.0–46.0)
HEMOGLOBIN: 10.6 g/dL — AB (ref 12.0–15.0)
MCH: 22.5 pg — ABNORMAL LOW (ref 26.0–34.0)
MCHC: 27.7 g/dL — AB (ref 30.0–36.0)
MCV: 80.9 fL (ref 78.0–100.0)
Platelets: 283 10*3/uL (ref 150–400)
RBC: 4.72 MIL/uL (ref 3.87–5.11)
RDW: 16.7 % — ABNORMAL HIGH (ref 11.5–15.5)
WBC: 7.7 10*3/uL (ref 4.0–10.5)

## 2015-09-07 NOTE — Care Management Note (Addendum)
Case Management Note  Patient Details  Name: Donna Hall MRN: [id number] Date of Birth: 09-16-1985  Subjective/Objective:   Pt admitted with CAP, [redacted] weeks pregnant                   Action/Plan Pt is from home with husband.  Husband and daughter speak english, pt is non english speaking and cant read nor write.   Pt is active with [facility] and has follow up appt.scheduled, pt uses pharmacy for medications.  CM received referral to request appt be made to discuss possible termination of pregnancy due to health reasons per attending.  Pt is without insurance, CM contacted [facility] and was informed that this service/referral can not be provided at clinic.  CM left voicemail at [facility] requesting call back.    CM spoke with both CM director and AD and was instructed to not arrange appt, this type of referral should be made via doctor to doctor communication.  CM communicated this to attending; attending acknowledged CM constraints, attending received referral information from [facility] department and will personally provide information to pt/husband and request that pt/husband follow up with recommendations provided.     Expected Discharge Date:                  Expected Discharge Plan:  Home/Self Care  In-House Referral:     Discharge planning Services  CM Consult, [facility]  Post Acute Care Choice:    Choice offered to:     DME Arranged:    DME Agency:     HH Arranged:    HH Agency:     Status of Service:  Completed, signed off  Medicare Important Message Given:    Date Medicare IM Given:    Medicare IM give by:    Date Additional Medicare IM Given:    Additional Medicare Important Message give by:     If discussed at Long Length of Stay Meetings, dates discussed:    Additional Comments:  Cherylann Parr, RN 09/07/2015, 3:37 PM

## 2015-09-07 NOTE — Progress Notes (Signed)
Utilization review completed. Ahriyah Vannest, RN, BSN. 

## 2015-09-07 NOTE — Clinical Documentation Improvement (Signed)
Hospitalist  Would you please help clarify the specific medical condition related to the listed clinical findings?   Acute respiratory failure with hypoxia  Acute on chronic respiratory failure with hypoxia  Chronic respiratory failure  Other  Clinically Undetermined   Supporting Information: Pt noted to with hypoxemia, tachypnea, and tachycardia.  Dependent on oxygen at home. Placed on 2 liters of oxygen. Physical assessment - dyspnea, productive cough, wheezing, and decreased breathe sounds.   Please exercise your independent, professional judgment when responding. A specific answer is not anticipated or expected.   Thank Modesta Messing [facility] Health Information Management [place] [phone]

## 2015-09-07 NOTE — Hospital Discharge Follow-Up (Signed)
Met with Dr Karleen Hampshire, Elenor Quinones, RN CM and the patient's nurse, Romelle Starcher and was informed that discharge for today as being discussed.  The patient has an appointment at the [facility] with Dr Adrian Blackwater on 09/13/15 @ 1400 and the information is on her AVS.   She does not have any insurance and has been counseled in the past regarding the importance of completing her medicaid application with DSS.  Her desires to terminate her pregnancy and plans for OB follow up will need to be addressed prior to her discharge.

## 2015-09-07 NOTE — Progress Notes (Signed)
[facility] PROGRESS NOTE  Donna Hall JOA:[id number] DOB: [birth year] DOA: 09/05/2015 PCP: Lora Paula, MD  Length of stay: 2   Assessment/Plan: Principal Problem:   CAP (community acquired pneumonia) Active Problems:   Rheumatoid arthritis (HCC)   Obesity, Class III, BMI 40-49.9 (morbid obesity) (HCC)   Systemic lupus erythematosus (HCC)   Essential hypertension, benign   Anxiety and depression   History of pulmonary embolism   Pregnancy at early stage    Brief summary Donna Hall is a 30 y.o. female with a past medical history of chronic pain syndrome, obesity, chronic steroid use, systemic lupus erythematosus, rheumatoid arthritis, hypertension, gestational diabetes, depression, obstructive sleep apnea, interstitial lung disease on home oxygen, pulmonary embolus seen, history of pneumonia in the past who comes to emergency department with complaints of fever, chills, fatigue, dyspnea, productive cough and wheezing for 3 days.  Per patient, a few days ago she started having generalized body aches, chills, non- productive cough and fatigue. She states that the following day symptoms were worse, cough became productive, she started having wheezing and increased dyspnea from baseline. No travel history, with possible sick contacts. She was admitted for 2 days back in January for community-acquired pneumonia.  When seen in the emergency department, the patient was initially tachycardic, tachypneic, hypoxic in the high 80s, but she was off her nasal cannula oxygen. This improved improved significantly once supplemental oxygen was restored again. Workup in the emergency department reveals anemia, unchanged chest x-ray and positivehCG, Beta Chain, Quant. Influenza PCR was negative. She is improving, breathing better, .   Assessment and plan CAP (community acquired pneumonia) Admitted to telemetry/inpatient. Continue bronchodilators as needed. Continue  IV antibiotics and steroids. Along with nasal canula oxygen.  Positive for strep pneumo antigen Influenza panel negative, follow blood culture results Repeat CXR ordered for evaluation of improvement.     Rheumatoid arthritis (HCC)  Systemic lupus erythematosus (HCC) on methylprednisolone for now. Analgesics as needed. Hold Imuran until the patient talks to GYN.   Obesity, Class III, BMI 40-49.9 (morbid obesity) (HCC) Needs drastic lifestyle modifications, since given her medical history, surgery may not be an option.    Essential hypertension, benign Stable. Continue low-sodium diet. Monitor blood pressure periodically.   Anxiety and depression Continue hydroxyzine as needed for anxiety.   History of pulmonary embolism Hold Xarelto. Lovenox SQ per pharmacy until pregnancy discussed with GYN.   Pregnancy at early stage.  Nine week intrauterine pregnancy Per patient, she does not wish to carry the pregnancy to term given her poor state of health and in need of disease modifying immunosuppressants and anticoagulants that she is taking. Hold Imuran and Xarelto. Continue glucocorticoids and anticoagulation with Lovenox. Pharmacy consultation to review medications contraindicated in first trimester, active and home med list Called on call OB, who suggested that they do not take pt's for elective termination of pregnancy.  [facility] the above, haven't got a call back yet.  Patient does not want her husband to be aware of the pregnancy and she does not have insurance for a referral to OB.  Please address the issue with the patient before discharge.      DVT prophylaxsis Lovenox  Code Status:      Code Status Orders        Start     Ordered   09/06/15 0203  Full code   Continuous  09/06/15 0202    Code Status History      Family Communication: Discussed in detail with the patient using interpreter line ,  all imaging results, lab results explained to the patient   Disposition Plan:  Possible discharge in am after CXR results.     Consultants:  Pharmacy consultation to review medications    Procedures:  None    Antibiotics: Anti-infectives    Start     Dose/Rate Route Frequency Ordered Stop   09/06/15 1700  cefTRIAXone (ROCEPHIN) 1 g in dextrose 5 % 50 mL IVPB  Status:  Discontinued     1 g 100 mL/hr over 30 Minutes Intravenous Every 24 hours 09/06/15 1053 09/06/15 1053   09/06/15 1700  azithromycin (ZITHROMAX) 500 mg in dextrose 5 % 250 mL IVPB     500 mg 250 mL/hr over 60 Minutes Intravenous Every 24 hours 09/06/15 1055     09/06/15 1045  cefTRIAXone (ROCEPHIN) 1 g in dextrose 5 % 50 mL IVPB     1 g 100 mL/hr over 30 Minutes Intravenous Every 24 hours 09/06/15 1036     09/06/15 1045  azithromycin (ZITHROMAX) 500 mg in dextrose 5 % 250 mL IVPB  Status:  Discontinued     500 mg 250 mL/hr over 60 Minutes Intravenous Every 24 hours 09/06/15 1036 09/06/15 1055   09/06/15 0202  cefTRIAXone (ROCEPHIN) 1 g in dextrose 5 % 50 mL IVPB  Status:  Discontinued     1 g 100 mL/hr over 30 Minutes Intravenous Every 24 hours 09/06/15 0202 09/06/15 1052   09/06/15 0202  azithromycin (ZITHROMAX) 500 mg in dextrose 5 % 250 mL IVPB  Status:  Discontinued     500 mg 250 mL/hr over 60 Minutes Intravenous Every 24 hours 09/06/15 0202 09/06/15 1054   09/05/15 1845  cefTRIAXone (ROCEPHIN) 1 g in dextrose 5 % 50 mL IVPB     1 g 100 mL/hr over 30 Minutes Intravenous  Once 09/05/15 1837 09/05/15 2018   09/05/15 1845  azithromycin (ZITHROMAX) 500 mg in dextrose 5 % 250 mL IVPB     500 mg 250 mL/hr over 60 Minutes Intravenous  Once 09/05/15 1837 09/05/15 2018         HPI/Subjective: Denies any chest pain or shortness of breath this morning  Objective: Filed Vitals:   09/07/15 0637 09/07/15 0804 09/07/15 1116 09/07/15 1126  BP: 107/45 104/57 89/34 122/68  Pulse: 64 68 65 65  Temp: 99 F  (37.2 C) 98.4 F (36.9 C) 98.9 F (37.2 C)   TempSrc: Oral Oral Oral   Resp: 24     Height:      Weight: 124.785 kg (275 lb 1.6 oz)     SpO2: 97% 99% 96%     Intake/Output Summary (Last 24 hours) at 09/07/15 1850 Last data filed at 09/07/15 1800  Gross per 24 hour  Intake   1200 ml  Output   2100 ml  Net   -900 ml    Exam:  General: No acute respiratory distress Lungs: Clear to auscultation bilaterally without wheezes or crackles Cardiovascular: Regular rate and rhythm without murmur gallop or rub normal S1 and S2 Abdomen: Nontender, nondistended, soft, bowel sounds positive, no rebound, no ascites, no appreciable mass Extremities: No significant cyanosis, clubbing, or edema bilateral lower extremities     Data Review   Micro Results Recent Results (from the past 240 hour(s))  Culture, blood (routine x 2) Call MD if unable to obtain prior to  antibiotics being given     Status: None (Preliminary result)   Collection Time: 09/06/15  6:31 AM  Result Value Ref Range Status   Specimen Description BLOOD LEFT ANTECUBITAL  Final   Special Requests BOTTLES DRAWN AEROBIC AND ANAEROBIC 5CC  Final   Culture NO GROWTH 1 DAY  Final   Report Status PENDING  Incomplete  Culture, blood (routine x 2) Call MD if unable to obtain prior to antibiotics being given     Status: None (Preliminary result)   Collection Time: 09/06/15  6:42 AM  Result Value Ref Range Status   Specimen Description BLOOD BLOOD LEFT FOREARM  Final   Special Requests BOTTLES DRAWN AEROBIC AND ANAEROBIC 5CC  Final   Culture NO GROWTH 1 DAY  Final   Report Status PENDING  Incomplete    Radiology Reports Dg Chest 2 View  09/05/2015  CLINICAL DATA:  Cough with body aches for 5 days EXAM: CHEST  2 VIEW COMPARISON:  07/12/2015.  04/01/2015 FINDINGS: Low volume film. The bilateral diffuse interstitial lung disease is again noted. There is patchy airspace opacity at the left base, stable to slightly progressed in the  interval. The flame shaped opacity in the left upper lung persists without interval change. Cardiopericardial silhouette is at upper limits of normal for size. The visualized bony structures of the thorax are intact. IMPRESSION: No substantial change in the nearly 2 month interval since the prior study but left lung airspace disease and focal upper lobe opacity is new since 04/01/2015. CT chest may prove helpful to further evaluate. Electronically Signed   By: Kennith Center M.D.   On: 09/05/2015 15:10   US Ob Comp Less 14 Wks  09/05/2015  CLINICAL DATA:  Positive pregnancy test EXAM: OBSTETRIC <14 WK [place] AND TRANSVAGINAL OB US TECHNIQUE: Both transabdominal and transvaginal ultrasound examinations were performed for complete evaluation of the gestation as well as the maternal uterus, adnexal regions, and pelvic cul-de-sac. Transvaginal technique was performed to assess early pregnancy. COMPARISON:  None. FINDINGS: Intrauterine gestational sac: Visualized/normal in shape. Yolk sac:  Not visualized Embryo:  Visualized Cardiac Activity: Visualized Heart Rate: 157  bpm MSD: 27.9  mm   Seven w   6  d CRL:  22.8  mm   9 w   0 d                  [place] EDC: 04/08/2016 Subchorionic hemorrhage: None visualized. A small amount of fluid in the cervical canal. Maternal uterus/adnexae: No adnexal masses.  No free fluid IMPRESSION: Nine week intrauterine pregnancy. Fetal heart rate 157 beats per minute. No acute maternal findings. Electronically Signed   By: Charlett Nose M.D.   On: 09/05/2015 18:13   US Ob Transvaginal  09/05/2015  CLINICAL DATA:  Positive pregnancy test EXAM: OBSTETRIC <14 WK [place] AND TRANSVAGINAL OB US TECHNIQUE: Both transabdominal and transvaginal ultrasound examinations were performed for complete evaluation of the gestation as well as the maternal uterus, adnexal regions, and pelvic cul-de-sac. Transvaginal technique was performed to assess early pregnancy. COMPARISON:  None. FINDINGS: Intrauterine  gestational sac: Visualized/normal in shape. Yolk sac:  Not visualized Embryo:  Visualized Cardiac Activity: Visualized Heart Rate: 157  bpm MSD: 27.9  mm   Seven w   6  d CRL:  22.8  mm   9 w   0 d                  [place] EDC: 04/08/2016 Subchorionic hemorrhage: None  visualized. A small amount of fluid in the cervical canal. Maternal uterus/adnexae: No adnexal masses.  No free fluid IMPRESSION: Nine week intrauterine pregnancy. Fetal heart rate 157 beats per minute. No acute maternal findings. Electronically Signed   By: Charlett Nose M.D.   On: 09/05/2015 18:13     CBC  Recent Labs Lab 09/05/15 1536 09/06/15 0644 09/07/15 0403  WBC 6.7 5.4 7.7  HGB 11.0* 11.1* 10.6*  HCT 39.3 40.1 38.2  PLT 256 269 283  MCV 81.2 81.7 80.9  MCH 22.7* 22.6* 22.5*  MCHC 28.0* 27.7* 27.7*  RDW 16.9* 16.8* 16.7*  LYMPHSABS 1.4 0.7  --   MONOABS 0.4 0.1  --   EOSABS 0.0 0.0  --   BASOSABS 0.0 0.0  --     Chemistries   Recent Labs Lab 09/05/15 1536 09/05/15 2047 09/06/15 0644 09/07/15 0403  NA 140  --  139 140  K 4.4  --  4.4 4.3  CL 102  --  102 104  CO2 25  --  25 27  GLUCOSE 128*  --  120* 132*  BUN 13  --  6 8  CREATININE 0.52  --  0.36* 0.34*  CALCIUM 8.7*  --  8.6* 8.7*  MG  --  1.8  --   --   AST  --   --  22 17  ALT  --   --  16 16  ALKPHOS  --   --  52 49  BILITOT  --   --  0.4 0.3   ------------------------------------------------------------------------------------------------------------------ estimated creatinine clearance is 125.3 mL/min (by C-G formula based on Cr of 0.34). ------------------------------------------------------------------------------------------------------------------ No results for input(s): HGBA1C in the last 72 hours. ------------------------------------------------------------------------------------------------------------------ No results for input(s): CHOL, HDL, LDLCALC, TRIG, CHOLHDL, LDLDIRECT in the last 72  hours. ------------------------------------------------------------------------------------------------------------------ No results for input(s): TSH, T4TOTAL, T3FREE, THYROIDAB in the last 72 hours.  Invalid input(s): FREET3 ------------------------------------------------------------------------------------------------------------------ No results for input(s): VITAMINB12, FOLATE, FERRITIN, TIBC, IRON, RETICCTPCT in the last 72 hours.  Coagulation profile No results for input(s): INR, PROTIME in the last 168 hours.  No results for input(s): DDIMER in the last 72 hours.  Cardiac Enzymes No results for input(s): CKMB, TROPONINI, MYOGLOBIN in the last 168 hours.  Invalid input(s): CK ------------------------------------------------------------------------------------------------------------------ Invalid input(s): POCBNP   CBG:  Recent Labs Lab 09/06/15 0736 09/06/15 1119 09/06/15 1700 09/07/15 0648 09/07/15 1116  GLUCAP 123* 135* 115* 124* 115*       Studies: No results found.    Lab Results  Component Value Date   HGBA1C 5.60 07/27/2015   HGBA1C 5.5 02/23/2014   HGBA1C 5.5 06/15/2013   Lab Results  Component Value Date   MICROALBUR 2.47* 03/07/2012   LDLCALC 64 03/08/2012   CREATININE 0.34* 09/07/2015       Scheduled Meds: . azithromycin  500 mg Intravenous Q24H  . cefTRIAXone (ROCEPHIN)  IV  1 g Intravenous Q24H  . enoxaparin (LOVENOX) injection  130 mg Subcutaneous Q12H  . ipratropium-albuterol  3 mL Nebulization Q6H  . methylPREDNISolone (SOLU-MEDROL) injection  80 mg Intravenous Q6H   Continuous Infusions:    Principal Problem:   CAP (community acquired pneumonia) Active Problems:   Rheumatoid arthritis (HCC)   Obesity, Class III, BMI 40-49.9 (morbid obesity) (HCC)   Systemic lupus erythematosus (HCC)   Essential hypertension, benign   Anxiety and depression   History of pulmonary embolism   Pregnancy at early stage     Time spent:  25 minutes   Najla Aughenbaugh  [facility] Pager [phone] If 7PM-7AM, please contact night-coverage at [email or web address], password [facility] 09/07/2015, 6:50 PM  LOS: 2 days

## 2015-09-07 NOTE — Telephone Encounter (Signed)
Call placed to [facility] # [phone]  to check on that status of the patient's O2 equipment at home. Spoke to [place] who stated that the patient does not have insurance and will need to come to their office to have her O2 tanks re-filled, they will not make home visits. She confirmed that the patient has an oxygen concentrator in the home and has small refillable tanks.  She also noted that the patient should be cleaning the filters on the concentrator weekly and changing the green O2 tubing every 3 months and the nasal cannula every month. She can obtain those supplies at the [facility] retail store. Sue Lush noted that [facility] will not go to the home to provide any instructions for the patient. She also noted that the patient is not eligible for a portable O2 concentrator without having any insurance.

## 2015-09-08 ENCOUNTER — Ambulatory Visit (HOSPITAL_COMMUNITY): Payer: Medicaid Other

## 2015-09-08 DIAGNOSIS — J189 Pneumonia, unspecified organism: Secondary | ICD-10-CM

## 2015-09-08 DIAGNOSIS — R072 Precordial pain: Secondary | ICD-10-CM

## 2015-09-08 LAB — COMPREHENSIVE METABOLIC PANEL
ALBUMIN: 3.1 g/dL — AB (ref 3.5–5.0)
ALK PHOS: 47 U/L (ref 38–126)
ALT: 32 U/L (ref 14–54)
ANION GAP: 8 (ref 5–15)
AST: 32 U/L (ref 15–41)
BILIRUBIN TOTAL: 0.6 mg/dL (ref 0.3–1.2)
BUN: 12 mg/dL (ref 6–20)
CALCIUM: 8.6 mg/dL — AB (ref 8.9–10.3)
CO2: 29 mmol/L (ref 22–32)
Chloride: 104 mmol/L (ref 101–111)
Creatinine, Ser: 0.44 mg/dL (ref 0.44–1.00)
GFR calc Af Amer: >60 mL/min (ref >60)
GFR calc non Af Amer: >60 mL/min (ref >60)
GLUCOSE: 128 mg/dL — AB (ref 65–99)
POTASSIUM: 4.1 mmol/L (ref 3.5–5.1)
SODIUM: 141 mmol/L (ref 135–145)
TOTAL PROTEIN: 6 g/dL — AB (ref 6.5–8.1)

## 2015-09-08 LAB — GLUCOSE, CAPILLARY: Glucose-Capillary: 119 mg/dL — ABNORMAL HIGH (ref 65–99)

## 2015-09-08 LAB — ECHOCARDIOGRAM COMPLETE
Height: 60 in
Weight: 4404.8 oz

## 2015-09-08 MED ORDER — IPRATROPIUM-ALBUTEROL 0.5-2.5 (3) MG/3ML IN SOLN
3.0000 mL | Freq: Three times a day (TID) | RESPIRATORY_TRACT | Status: DC
  Administered 2015-09-08 – 2015-09-09 (×3): 3 mL via RESPIRATORY_TRACT
  Filled 2015-09-08 (×4): qty 3

## 2015-09-08 MED ORDER — OXYCODONE HCL 5 MG PO TABS
5.0000 mg | ORAL_TABLET | Freq: Once | ORAL | Status: AC
  Administered 2015-09-08: 5 mg via ORAL
  Filled 2015-09-08: qty 1

## 2015-09-08 MED ORDER — DEXTROSE 5 % IV SOLN
2.0000 g | INTRAVENOUS | Status: DC
  Administered 2015-09-08 – 2015-09-09 (×2): 2 g via INTRAVENOUS
  Filled 2015-09-08 (×2): qty 2

## 2015-09-08 NOTE — Progress Notes (Signed)
[facility] PROGRESS NOTE  Donna Hall LDJ:[id number] DOB: 1985-09-28 DOA: 09/05/2015 PCP: Lora Paula, MD  Length of stay: 3   Assessment/Plan: Principal Problem:   CAP (community acquired pneumonia) Active Problems:   Rheumatoid arthritis (HCC)   Obesity, Class III, BMI 40-49.9 (morbid obesity) (HCC)   Systemic lupus erythematosus (HCC)   Essential hypertension, benign   Anxiety and depression   History of pulmonary embolism   Pregnancy at early stage    Brief summary Donna Hall is a 30 y.o. female with a past medical history of chronic pain syndrome, obesity, chronic steroid use, systemic lupus erythematosus, rheumatoid arthritis, hypertension, gestational diabetes, depression, obstructive sleep apnea, interstitial lung disease on home oxygen, pulmonary embolus seen, history of pneumonia in the past who comes to emergency department with complaints of fever, chills, fatigue, dyspnea, productive cough and wheezing for 3 days.  Per patient, a few days ago she started having generalized body aches, chills, non- productive cough and fatigue. She states that the following day symptoms were worse, cough became productive, she started having wheezing and increased dyspnea from baseline. No travel history, with possible sick contacts. She was admitted for 2 days back in January for community-acquired pneumonia.  When seen in the emergency department, the patient was initially tachycardic, tachypneic, hypoxic in the high 80s, but she was off her nasal cannula oxygen. This improved improved significantly once supplemental oxygen was restored again. Workup in the emergency department reveals anemia, unchanged chest x-ray and positivehCG, Beta Chain, Quant. Influenza PCR was negative. She is improving, breathing better, .   Assessment and plan CAP (community acquired pneumonia) Continue bronchodilators as needed. Continue IV antibiotics and steroids.  Along with nasal canula oxygen.  Positive for strep pneumo antigen Influenza panel negative, follow blood culture results Repeat CXR ordered for evaluation of improvement.     Rheumatoid arthritis (HCC)  Systemic lupus erythematosus (HCC) on methylprednisolone for now. Analgesics as needed. Hold Imuran until the patient talks to GYN.   Obesity, Class III, BMI 40-49.9 (morbid obesity) (HCC) Needs drastic lifestyle modifications, since given her medical history, surgery may not be an option.    Essential hypertension, benign Stable. Continue low-sodium diet. Monitor blood pressure periodically.   Anxiety and depression Continue hydroxyzine as needed for anxiety.   History of pulmonary embolism Hold Xarelto. Lovenox SQ per pharmacy until pregnancy discussed with GYN.   Pregnancy at early stage.  Nine week intrauterine pregnancy Per patient, she does not wish to carry the pregnancy to term given her poor state of health and in need of disease modifying immunosuppressants and anticoagulants that she is taking. Hold Imuran and Xarelto. Continue glucocorticoids and anticoagulation with Lovenox. on call OB, who suggested that they do not take pt's for elective termination of pregnancy.  CM will give information regarding   [facility]. I advised patient to follow with PCP for further discussion. Will ask CM to see if patient can follow with OBG.      DVT prophylaxsis Lovenox  Code Status:      Code Status Orders        Start     Ordered   09/06/15 0203  Full code   Continuous     09/06/15 0202    Code Status History      Family Communication: Discussed in detail with the patient using interpreter line , all imaging results, lab results explained to the patient   Disposition Plan:  Possible discharge  in am after CXR results.     Consultants:  Pharmacy consultation to review medications    Procedures:  None    Antibiotics: Anti-infectives     Start     Dose/Rate Route Frequency Ordered Stop   09/08/15 1045  cefTRIAXone (ROCEPHIN) 2 g in dextrose 5 % 50 mL IVPB     2 g 100 mL/hr over 30 Minutes Intravenous Every 24 hours 09/08/15 0752     09/06/15 1700  cefTRIAXone (ROCEPHIN) 1 g in dextrose 5 % 50 mL IVPB  Status:  Discontinued     1 g 100 mL/hr over 30 Minutes Intravenous Every 24 hours 09/06/15 1053 09/06/15 1053   09/06/15 1700  azithromycin (ZITHROMAX) 500 mg in dextrose 5 % 250 mL IVPB     500 mg 250 mL/hr over 60 Minutes Intravenous Every 24 hours 09/06/15 1055     09/06/15 1045  cefTRIAXone (ROCEPHIN) 1 g in dextrose 5 % 50 mL IVPB  Status:  Discontinued     1 g 100 mL/hr over 30 Minutes Intravenous Every 24 hours 09/06/15 1036 09/08/15 0752   09/06/15 1045  azithromycin (ZITHROMAX) 500 mg in dextrose 5 % 250 mL IVPB  Status:  Discontinued     500 mg 250 mL/hr over 60 Minutes Intravenous Every 24 hours 09/06/15 1036 09/06/15 1055   09/06/15 0202  cefTRIAXone (ROCEPHIN) 1 g in dextrose 5 % 50 mL IVPB  Status:  Discontinued     1 g 100 mL/hr over 30 Minutes Intravenous Every 24 hours 09/06/15 0202 09/06/15 1052   09/06/15 0202  azithromycin (ZITHROMAX) 500 mg in dextrose 5 % 250 mL IVPB  Status:  Discontinued     500 mg 250 mL/hr over 60 Minutes Intravenous Every 24 hours 09/06/15 0202 09/06/15 1054   09/05/15 1845  cefTRIAXone (ROCEPHIN) 1 g in dextrose 5 % 50 mL IVPB     1 g 100 mL/hr over 30 Minutes Intravenous  Once 09/05/15 1837 09/05/15 2018   09/05/15 1845  azithromycin (ZITHROMAX) 500 mg in dextrose 5 % 250 mL IVPB     500 mg 250 mL/hr over 60 Minutes Intravenous  Once 09/05/15 1837 09/05/15 2018         HPI/Subjective: Denies any chest pain or shortness of breath this morning eeling better. She does not want to continue with pregnancy due to her medical health.   Objective: Filed Vitals:   09/08/15 0517 09/08/15 0913 09/08/15 1210 09/08/15 1416  BP: 114/52  99/65   Pulse: 66  62   Temp: 98.7 F  (37.1 C)  98.9 F (37.2 C)   TempSrc: Oral  Oral   Resp: 22  20   Height:      Weight: 124.875 kg (275 lb 4.8 oz)     SpO2: 98% 95% 97% 97%    Intake/Output Summary (Last 24 hours) at 09/08/15 1536 Last data filed at 09/08/15 1211  Gross per 24 hour  Intake    840 ml  Output   1000 ml  Net   -160 ml    Exam:  General: No acute respiratory distress Lungs: Clear to auscultation bilaterally without wheezes or crackles Cardiovascular: Regular rate and rhythm without murmur gallop or rub normal S1 and S2 Abdomen: Nontender, nondistended, soft, bowel sounds positive, no rebound, no ascites, no appreciable mass Extremities: No significant cyanosis, clubbing, or edema bilateral lower extremities     Data Review   Micro Results Recent Results (from the past 240 hour(s))  Culture,  blood (routine x 2) Call MD if unable to obtain prior to antibiotics being given     Status: None (Preliminary result)   Collection Time: 09/06/15  6:31 AM  Result Value Ref Range Status   Specimen Description BLOOD LEFT ANTECUBITAL  Final   Special Requests BOTTLES DRAWN AEROBIC AND ANAEROBIC 5CC  Final   Culture NO GROWTH 2 DAYS  Final   Report Status PENDING  Incomplete  Culture, blood (routine x 2) Call MD if unable to obtain prior to antibiotics being given     Status: None (Preliminary result)   Collection Time: 09/06/15  6:42 AM  Result Value Ref Range Status   Specimen Description BLOOD BLOOD LEFT FOREARM  Final   Special Requests BOTTLES DRAWN AEROBIC AND ANAEROBIC 5CC  Final   Culture NO GROWTH 2 DAYS  Final   Report Status PENDING  Incomplete    Radiology Reports Dg Chest 2 View  09/07/2015  CLINICAL DATA:  Acute onset of shortness of breath and generalized chest pain. Initial encounter. EXAM: CHEST  2 VIEW COMPARISON:  Chest radiograph from 09/05/2015 FINDINGS: The lungs are hypoexpanded. There is mild elevation of the right hemidiaphragm. Vascular congestion is noted. Increased  interstitial markings raise concern for mild interstitial edema. There is no evidence of pleural effusion or pneumothorax. The heart is normal in size; the mediastinal contour is within normal limits. No acute osseous abnormalities are seen. IMPRESSION: Lungs hypoexpanded. Mild elevation of the right hemidiaphragm. Vascular congestion noted. Increased interstitial markings raise concern for mild interstitial edema. Electronically Signed   By: Roanna Raider M.D.   On: 09/07/2015 19:25   Dg Chest 2 View  09/05/2015  CLINICAL DATA:  Cough with body aches for 5 days EXAM: CHEST  2 VIEW COMPARISON:  07/12/2015.  04/01/2015 FINDINGS: Low volume film. The bilateral diffuse interstitial lung disease is again noted. There is patchy airspace opacity at the left base, stable to slightly progressed in the interval. The flame shaped opacity in the left upper lung persists without interval change. Cardiopericardial silhouette is at upper limits of normal for size. The visualized bony structures of the thorax are intact. IMPRESSION: No substantial change in the nearly 2 month interval since the prior study but left lung airspace disease and focal upper lobe opacity is new since 04/01/2015. CT chest may prove helpful to further evaluate. Electronically Signed   By: Kennith Center M.D.   On: 09/05/2015 15:10   US Ob Comp Less 14 Wks  09/05/2015  CLINICAL DATA:  Positive pregnancy test EXAM: OBSTETRIC <14 WK [place] AND TRANSVAGINAL OB US TECHNIQUE: Both transabdominal and transvaginal ultrasound examinations were performed for complete evaluation of the gestation as well as the maternal uterus, adnexal regions, and pelvic cul-de-sac. Transvaginal technique was performed to assess early pregnancy. COMPARISON:  None. FINDINGS: Intrauterine gestational sac: Visualized/normal in shape. Yolk sac:  Not visualized Embryo:  Visualized Cardiac Activity: Visualized Heart Rate: 157  bpm MSD: 27.9  mm   Seven w   6  d CRL:  22.8  mm   9 w   0  d                  [place] EDC: 04/08/2016 Subchorionic hemorrhage: None visualized. A small amount of fluid in the cervical canal. Maternal uterus/adnexae: No adnexal masses.  No free fluid IMPRESSION: Nine week intrauterine pregnancy. Fetal heart rate 157 beats per minute. No acute maternal findings. Electronically Signed   By: Charlett Nose M.D.  On: 09/05/2015 18:13   US Ob Transvaginal  09/05/2015  CLINICAL DATA:  Positive pregnancy test EXAM: OBSTETRIC <14 WK [place] AND TRANSVAGINAL OB US TECHNIQUE: Both transabdominal and transvaginal ultrasound examinations were performed for complete evaluation of the gestation as well as the maternal uterus, adnexal regions, and pelvic cul-de-sac. Transvaginal technique was performed to assess early pregnancy. COMPARISON:  None. FINDINGS: Intrauterine gestational sac: Visualized/normal in shape. Yolk sac:  Not visualized Embryo:  Visualized Cardiac Activity: Visualized Heart Rate: 157  bpm MSD: 27.9  mm   Seven w   6  d CRL:  22.8  mm   9 w   0 d                  [place] EDC: 04/08/2016 Subchorionic hemorrhage: None visualized. A small amount of fluid in the cervical canal. Maternal uterus/adnexae: No adnexal masses.  No free fluid IMPRESSION: Nine week intrauterine pregnancy. Fetal heart rate 157 beats per minute. No acute maternal findings. Electronically Signed   By: Charlett Nose M.D.   On: 09/05/2015 18:13     CBC  Recent Labs Lab 09/05/15 1536 09/06/15 0644 09/07/15 0403  WBC 6.7 5.4 7.7  HGB 11.0* 11.1* 10.6*  HCT 39.3 40.1 38.2  PLT 256 269 283  MCV 81.2 81.7 80.9  MCH 22.7* 22.6* 22.5*  MCHC 28.0* 27.7* 27.7*  RDW 16.9* 16.8* 16.7*  LYMPHSABS 1.4 0.7  --   MONOABS 0.4 0.1  --   EOSABS 0.0 0.0  --   BASOSABS 0.0 0.0  --     Chemistries   Recent Labs Lab 09/05/15 1536 09/05/15 2047 09/06/15 0644 09/07/15 0403 09/08/15 0440  NA 140  --  139 140 141  K 4.4  --  4.4 4.3 4.1  CL 102  --  102 104 104  CO2 25  --  25 27 29   GLUCOSE 128*  --  120*  132* 128*  BUN 13  --  6 8 12   CREATININE 0.52  --  0.36* 0.34* 0.44  CALCIUM 8.7*  --  8.6* 8.7* 8.6*  MG  --  1.8  --   --   --   AST  --   --  22 17 32  ALT  --   --  16 16 32  ALKPHOS  --   --  52 49 47  BILITOT  --   --  0.4 0.3 0.6   ------------------------------------------------------------------------------------------------------------------ estimated creatinine clearance is 125.5 mL/min (by C-G formula based on Cr of 0.44). ------------------------------------------------------------------------------------------------------------------ No results for input(s): HGBA1C in the last 72 hours. ------------------------------------------------------------------------------------------------------------------ No results for input(s): CHOL, HDL, LDLCALC, TRIG, CHOLHDL, LDLDIRECT in the last 72 hours. ------------------------------------------------------------------------------------------------------------------ No results for input(s): TSH, T4TOTAL, T3FREE, THYROIDAB in the last 72 hours.  Invalid input(s): FREET3 ------------------------------------------------------------------------------------------------------------------ No results for input(s): VITAMINB12, FOLATE, FERRITIN, TIBC, IRON, RETICCTPCT in the last 72 hours.  Coagulation profile No results for input(s): INR, PROTIME in the last 168 hours.  No results for input(s): DDIMER in the last 72 hours.  Cardiac Enzymes No results for input(s): CKMB, TROPONINI, MYOGLOBIN in the last 168 hours.  Invalid input(s): CK ------------------------------------------------------------------------------------------------------------------ Invalid input(s): POCBNP   CBG:  Recent Labs Lab 09/06/15 1119 09/06/15 1700 09/07/15 0648 09/07/15 1116 09/08/15 0635  GLUCAP 135* 115* 124* 115* 119*       Studies: Dg Chest 2 View  09/07/2015  CLINICAL DATA:  Acute onset of shortness of breath and generalized chest pain.  Initial encounter. EXAM: CHEST  2 VIEW COMPARISON:  Chest radiograph from 09/05/2015 FINDINGS: The lungs are hypoexpanded. There is mild elevation of the right hemidiaphragm. Vascular congestion is noted. Increased interstitial markings raise concern for mild interstitial edema. There is no evidence of pleural effusion or pneumothorax. The heart is normal in size; the mediastinal contour is within normal limits. No acute osseous abnormalities are seen. IMPRESSION: Lungs hypoexpanded. Mild elevation of the right hemidiaphragm. Vascular congestion noted. Increased interstitial markings raise concern for mild interstitial edema. Electronically Signed   By: Roanna Raider M.D.   On: 09/07/2015 19:25      Lab Results  Component Value Date   HGBA1C 5.60 07/27/2015   HGBA1C 5.5 02/23/2014   HGBA1C 5.5 06/15/2013   Lab Results  Component Value Date   MICROALBUR 2.47* 03/07/2012   LDLCALC 64 03/08/2012   CREATININE 0.44 09/08/2015       Scheduled Meds: . azithromycin  500 mg Intravenous Q24H  . cefTRIAXone (ROCEPHIN)  IV  2 g Intravenous Q24H  . enoxaparin (LOVENOX) injection  130 mg Subcutaneous Q12H  . ipratropium-albuterol  3 mL Nebulization TID  . methylPREDNISolone (SOLU-MEDROL) injection  80 mg Intravenous Q6H   Continuous Infusions:    Principal Problem:   CAP (community acquired pneumonia) Active Problems:   Rheumatoid arthritis (HCC)   Obesity, Class III, BMI 40-49.9 (morbid obesity) (HCC)   Systemic lupus erythematosus (HCC)   Essential hypertension, benign   Anxiety and depression   History of pulmonary embolism   Pregnancy at early stage     Time spent: 25 minutes   Torsten Weniger A  [facility] Pager [phone] If 7PM-7AM, please contact night-coverage at [email or web address], password [facility] 09/08/2015, 3:36 PM  LOS: 3 days

## 2015-09-08 NOTE — Progress Notes (Signed)
Notes from previous CM regarding outpt apt by Marissa Nestle RN CM  Pt is from home with husband. Husband and daughter speak english, pt is non english speaking and cant read nor write. Pt is active with [facility] and has follow up appt.scheduled, pt uses pharmacy for medications. CM received referral to request appt be made to discuss possible termination of pregnancy due to health reasons per attending. Pt is without insurance, CM contacted [facility] and was informed that this service/referral can not be provided at clinic. CM left voicemail at [facility] requesting call back. CM spoke with both CM director and AD and was instructed to not arrange appt, this type of referral should be made via doctor to doctor communication. CM communicated this to attending; attending acknowledged CM constraints, attending received referral information from [facility] department and will personally provide information to pt/husband and request that pt/husband follow up with recommendations provided.   Patient has home oxygen with [facility] and follows up with [facility] and [facility], prescriptions are filled there also. MD to address pregnancy termination if that is the patient's choice and make outpatient referrals. Abelino Derrick [facility] [phone]

## 2015-09-08 NOTE — Progress Notes (Signed)
Echocardiogram 2D Echocardiogram has been performed.  Donna Hall 09/08/2015, 11:03 AM

## 2015-09-08 NOTE — Progress Notes (Signed)
ANTICOAGULATION CONSULT NOTE - Follow up Consult  Pharmacy Consult for Lovenox Indication: h/o recurrent PE w/ SLE  No Known Allergies  Patient Measurements: Weight: 126.5kg  Vital Signs: Temp: 98.7 F (37.1 C) (03/15 0517) Temp Source: Oral (03/15 0517) BP: 114/52 mmHg (03/15 0517) Pulse Rate: 66 (03/15 0517)  Labs:  Recent Labs  09/05/15 1536 09/06/15 0644 09/07/15 0403 09/08/15 0440  HGB 11.0* 11.1* 10.6*  --   HCT 39.3 40.1 38.2  --   PLT 256 269 283  --   CREATININE 0.52 0.36* 0.34* 0.44    Estimated Creatinine Clearance: 125.5 mL/min (by C-G formula based on Cr of 0.44).   Medical History: Past Medical History  Diagnosis Date  . Psoriasis 2010    as a child  . Chronic pain disorder   . Obesity   . Chronic steroid use 2010  . Lupus (HCC) 2000  . Lupus (systemic lupus erythematosus) (HCC) 2010  . Rheumatoid arthritis(714.0) 2010  . Hypertension 2010  . Pneumonia "several times"  . Interstitial lung disease (HCC)     [name] 05/15/2014  . On home oxygen therapy     "2L; 24/7" (05/15/2014)  . Pulmonary embolism (HCC)     hx/notes 05/15/2014  . History of blood transfusion     "because white cells ate the red cells"  . Headache     "just when I have the pain from aching bones and psorasis and/or lupus symptoms" (05/15/2104)  . 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 Asthma   . Gestational diabetes 2006  . Depression   . Sleep apnea     Assessment: 29yo female with PMHx,SLE, HTN, DM and hx of PE that was non compliant with Xarelto therapy  admitted 3/12 with hypoxia, PNA and incidentally found to be pregnant. Due to hx of PE (likely needs life long anticoagulation) and  pregnancy decision made to transition to Lovenox for therapy. Currently on 130 mg q 12 hours. CBC stable with no sxs of bleeding.    Goal of Therapy:  Anti-Xa level 0.6-1 units/ml 4hrs after LMWH dose given Monitor  platelets by anticoagulation protocol: Yes   Plan:  1. Continue Lovenox 130 mg Q 12 hours 2. CBC q 72 hours  3. CM setting up f/u with GYN   Pollyann Samples, PharmD, BCPS 09/08/2015, 8:01 AM Pager: [phone]

## 2015-09-08 NOTE — Progress Notes (Addendum)
CM CONSULT  Talked to patient via Olga Millers, patient lives at home with her spouse and 2 children ( 68 and 31 yr old) she and her spouse cannot read or write but her children can. CM encouraged her to talk to her spouse about the pregnancy and possible termination of fetus if that is her choice. [facility] about [facility] will be given to the patient at discharge for her to follow up also she will continue to go to the [facility] and [facility] for ongoing medical care. She gets her prescriptions filled there also. She has oxygen through [facility] for several years but states that it does not work well. The oxygen her works a lot better. CM informed her that I will contact [facility] and they will go to her home to access her oxygen ( to see if it needs to be repaired or replaced).  Lots of emotional support given, patient was very receptive of conversation. Abelino Derrick [facility] [phone]

## 2015-09-09 LAB — COMPREHENSIVE METABOLIC PANEL
ALBUMIN: 3 g/dL — AB (ref 3.5–5.0)
ALT: 26 U/L (ref 14–54)
AST: 20 U/L (ref 15–41)
Alkaline Phosphatase: 44 U/L (ref 38–126)
Anion gap: 10 (ref 5–15)
BUN: 12 mg/dL (ref 6–20)
CHLORIDE: 102 mmol/L (ref 101–111)
CO2: 28 mmol/L (ref 22–32)
Calcium: 8.7 mg/dL — ABNORMAL LOW (ref 8.9–10.3)
Creatinine, Ser: 0.35 mg/dL — ABNORMAL LOW (ref 0.44–1.00)
GFR calc Af Amer: >60 mL/min (ref >60)
GFR calc non Af Amer: >60 mL/min (ref >60)
GLUCOSE: 105 mg/dL — AB (ref 65–99)
POTASSIUM: 4.3 mmol/L (ref 3.5–5.1)
Sodium: 140 mmol/L (ref 135–145)
Total Bilirubin: 0.3 mg/dL (ref 0.3–1.2)
Total Protein: 5.7 g/dL — ABNORMAL LOW (ref 6.5–8.1)

## 2015-09-09 LAB — GLUCOSE, CAPILLARY
Glucose-Capillary: 119 mg/dL — ABNORMAL HIGH (ref 65–99)
Glucose-Capillary: 155 mg/dL — ABNORMAL HIGH (ref 65–99)

## 2015-09-09 MED ORDER — PRE-NATAL FORMULA PO TABS: 1.0000 | ORAL_TABLET | Freq: Every day | ORAL | Status: DC

## 2015-09-09 MED ORDER — ENOXAPARIN (LOVENOX) PATIENT EDUCATION KIT
PACK | Freq: Once | Status: AC
Start: 2015-09-09 — End: 2015-09-09
  Administered 2015-09-09: 13:00:00
  Filled 2015-09-09: qty 1

## 2015-09-09 MED ORDER — FOLIC ACID 1 MG PO TABS: 1.0000 mg | ORAL_TABLET | Freq: Every day | ORAL | Status: DC

## 2015-09-09 MED ORDER — IPRATROPIUM BROMIDE 0.02 % IN SOLN: 0.5000 mg | Freq: Four times a day (QID) | RESPIRATORY_TRACT | Status: DC | PRN

## 2015-09-09 MED ORDER — AMOXICILLIN-POT CLAVULANATE 875-125 MG PO TABS: 1.0000 | ORAL_TABLET | Freq: Two times a day (BID) | ORAL | Status: DC

## 2015-09-09 MED ORDER — PREDNISONE 10 MG PO TABS: ORAL_TABLET | ORAL | Status: DC

## 2015-09-09 MED ORDER — FOLIC ACID 1 MG PO TABS
1.0000 mg | ORAL_TABLET | Freq: Every day | ORAL | Status: DC
  Administered 2015-09-09: 1 mg via ORAL
  Filled 2015-09-09: qty 1

## 2015-09-09 MED ORDER — PREDNISONE 10 MG PO TABS
ORAL_TABLET | ORAL | Status: DC
Start: 2015-09-09 — End: 2015-09-09

## 2015-09-09 MED ORDER — ENOXAPARIN SODIUM 100 MG/ML ~~LOC~~ SOLN: 130.0000 mg | Freq: Two times a day (BID) | SUBCUTANEOUS | Status: DC

## 2015-09-09 MED FILL — FOLIC ACID 1 MG TABLET: 1 | 30 days supply | Qty: 30 | Fill #0

## 2015-09-09 MED FILL — ENOXAPARIN 100 MG/ML SYR: 100 | 30 days supply | Qty: 30 | Fill #0

## 2015-09-09 MED FILL — predniSONE 10 MG TABS: 10 | 30 days supply | Qty: 120 | Fill #0

## 2015-09-09 MED FILL — AMOX-CLAV 875-125 MG TABLET: 875-125 | 2 days supply | Qty: 4 | Fill #0

## 2015-09-09 MED FILL — IPRATROPIUM BR 0.02% SOLN: 0.02 | 30 days supply | Qty: 3 | Fill #0

## 2015-09-09 NOTE — Progress Notes (Addendum)
Pt has orders to be discharged. Pt spanish speaking only and illiterate with complicated discharge orders. Interpreter needed at bedside for instructions to be given due to situation. Interpreter will come to bedside at 1 pm to give discharge instructions.   Garden at bedside and discharge instructions given to patient. Pt verbalized understanding regarding teaching given through interpreter. Discharge appointments given to patient and the importance of going to follow up appointments was stressed.Teaching done with Patient Lovenox Education Kit. Pt states "I know how to give myself injections because I had to do this with my daughter." Telephone number for "Lovenox Help Line" given to patient. Prescriptions for medications given to patient. Discharge instructions given in English and Spanish due to pt children being bilingual and both parents being illiterate.  Pt verbalized understanding through interpreter and has no additional questions. Telemetry box removed. IV removed and site in good condition. Pt stable and waiting for transportation.   Maurene Capes RN

## 2015-09-09 NOTE — Clinical Social Work Note (Cosign Needed)
Clinical Social Work Assessment  Patient Details  Name: Donna Hall MRN: [id number] Date of Birth: [birth year]  Date of referral:  09/09/15               Reason for consult:  Discharge Planning                Permission sought to share information with:  Case Manager Permission granted to share information::  Yes, Verbal Permission Granted  Name::        Agency::     Relationship::     Contact Information:     Housing/Transportation Living arrangements for the past 2 months:  Apartment Source of Information:  Patient Patient Interpreter Needed:  Spanish Criminal Activity/Legal Involvement Pertinent to Current Situation/Hospitalization:  No - Comment as needed Significant Relationships:  Spouse (young children) Lives with:  Spouse (young children) Do you feel safe going back to the place where you live?  Yes Need for family participation in patient care:  No (Coment)  Care giving concerns:  Patient is caring child, have not told the husband. Still deciding on her decision to keep or terminate child. Discharge planning   Social Worker assessment / plan:  BSW intern entered patient room. Patient was alert and oriented. BSW intern discuss possible discharge plans with patient and a spanish interpreter. Patient only speaks and understand spanish. BSW intern has spoke and notified case manager of patient decision.     Employment status:  Unemployed Health and safety inspector:    PT Recommendations:  Not assessed at this time Information / Referral to community resources:  Other (Comment Required) (clinic)  Patient/Family's Response to care:  Patient response to care is fine with returning home when discharge.   Patient/Family's Understanding of and Emotional Response to Diagnosis, Current Treatment, and Prognosis:  Patient does not fully understand current treatment or diagnosis so an interpreter has been request to stop by so a nurse can fully explain what is going on with the  patient.   Emotional Assessment Appearance:  Appears stated age Attitude/Demeanor/Rapport:   (nice sweet) Affect (typically observed):  Accepting, Calm Orientation:  Oriented to Self, Oriented to Place, Oriented to  Time, Oriented to Situation Alcohol / Substance use:  Never Used Psych involvement (Current and /or in the community):  No (Comment)  Discharge Needs  Concerns to be addressed:  No discharge needs identified Readmission within the last 30 days:  No Current discharge risk:  None Barriers to Discharge:  No Barriers Identified   Catheryn Bacon  BSW intern  [phone]

## 2015-09-09 NOTE — Progress Notes (Signed)
Interpreter Wyvonnia Dusky for [facility] care management and RN Toma Copier for  discharge

## 2015-09-09 NOTE — Progress Notes (Signed)
Interpreter Wyvonnia Dusky for [employer] Case Manger

## 2015-09-09 NOTE — Discharge Summary (Signed)
Physician Discharge Summary  Donna Hall Chilel XLK:[id number] DOB: 1985-11-03 DOA: 09/05/2015  PCP: Lora Paula, MD  Admit date: 09/05/2015 Discharge date: 09/09/2015  Time spent: 35 minutes  Recommendations for Outpatient Follow-up:  Please patient need to be refer to OB, GYN, for further care of pregnancy. Also [facility]. If patient decide to terminate pregnancy the OB, GYN will need to be aware patient is on lovenox.  Patient immune suppressive medication are on hold until patient make decision regarding continuation with pregnancy.  Follow up on resolution of PNA.  She will be discharge on prednisone 40 mg daily, this will need to be adjusted further.   Discharge Diagnoses:    CAP (community acquired pneumonia)   Rheumatoid arthritis (HCC)   Obesity, Class III, BMI 40-49.9 (morbid obesity) (HCC)   Systemic lupus erythematosus (HCC)   Essential hypertension, benign   Anxiety and depression   History of pulmonary embolism   Pregnancy at early stage    Discharge Condition: stable  Diet recommendation: heart healthy   Filed Weights   09/07/15 0637 09/08/15 0517 09/09/15 0539  Weight: 124.785 kg (275 lb 1.6 oz) 124.875 kg (275 lb 4.8 oz) 124.739 kg (275 lb)    History of present illness:   Donna Hall is a 30 y.o. female with a past medical history of chronic pain syndrome, obesity, chronic steroid use, systemic lupus erythematosus, rheumatoid arthritis, hypertension, gestational diabetes, depression, obstructive sleep apnea, interstitial lung disease on home oxygen, pulmonary embolus seen, history of pneumonia in the past who comes to emergency department with complaints of fever, chills, fatigue, dyspnea, productive cough and wheezing for 3 days.  Per patient, a few days ago she started having generalized body aches, chills, non- productive cough and fatigue. She states that the following day symptoms were  worse, cough became productive, she started having wheezing and increased dyspnea from baseline. No travel history, with possible sick contacts. She was admitted for 2 days back in January for community-acquired pneumonia.  When seen in the emergency department, the patient was initially tachycardic, tachypneic, hypoxic in the high 80s, but she was off her nasal cannula oxygen. This improved improved significantly once supplemental oxygen was restored again. Workup in the emergency department reveals anemia, unchanged chest x-ray and positivehCG, Beta Chain, Quant. Influenza PCR was negative.  Hospital Course:   Assessment and plan CAP (community acquired pneumonia) Continue bronchodilators as needed. Received IV antibiotics ceftriaxone, while inpatient 5 days. She will be discharge with 2 more days of augmentin  Positive for strep pneumo antigen Influenza panel negative, follow blood culture results She is feeling better, breathing better    Rheumatoid arthritis (HCC)  Systemic lupus erythematosus (HCC) Transition to prednisone.  Analgesics as needed. Hold Imuran until the patient talks to GYN.   Obesity, Class III, BMI 40-49.9 (morbid obesity) (HCC) Needs drastic lifestyle modifications, since given her medical history, surgery may not be an option.    Essential hypertension, benign Stable. Continue low-sodium diet. Monitor blood pressure periodically.   Anxiety and depression Hold hydroxyzine   History of pulmonary embolism Hold Xarelto. Lovenox SQ per pharmacy until pregnancy discussed with GYN.   Pregnancy at early stage.  Nine week intrauterine pregnancy Per patient, she does not wish to carry the pregnancy to term given her poor state of health and in need of disease modifying immunosuppressants and anticoagulants that she is taking. Hold Imuran and Xarelto. Continue glucocorticoids and anticoagulation with Lovenox. on call  OB, who suggested that they do not  take pt's for elective termination of pregnancy.  [facility]. I advised patient to follow with PCP for further discussion.   Procedures: ECHO; Left ventricle: The cavity size was mildly dilated. There was  mild concentric hypertrophy. Systolic function was normal. The  estimated ejection fraction was in the range of 60% to 65%. Wall  motion was normal; there were no regional wall motion  abnormalities. Left ventricular diastolic function parameters  were normal. - Aortic valve: There was no regurgitation. - Mitral valve: Transvalvular velocity was within the normal range.  There was no evidence for stenosis. There was no regurgitation. - Right ventricle: The cavity size was normal. Wall thickness was  normal. Systolic function was normal. - Tricuspid valve: There was mild regurgitation. Peak RV-RA  gradient (S): 28 mm Hg. - Pulmonic valve: There was mild regurgitation. - Inferior vena cava: The vessel was normal in size. The   respirophasic diameter changes were in the normal range (>= 50%).  Consultations:  none  Discharge Exam: Filed Vitals:   09/09/15 0539 09/09/15 0920  BP: 116/62   Pulse: 65 76  Temp: 98.3 F (36.8 C)   Resp: 22 16    General: NAD Cardiovascular: S 1, S 2 RRR Respiratory: CTA  Discharge Instructions   Discharge Instructions    Diet - low sodium heart healthy    Complete by:  As directed      Increase activity slowly    Complete by:  As directed           Current Discharge Medication List    START taking these medications   Details  amoxicillin-clavulanate (AUGMENTIN) 875-125 MG tablet Take 1 tablet by mouth 2 (two) times daily. Qty: 4 tablet, Refills: 0    enoxaparin (LOVENOX) 100 MG/ML injection Inject 1.3 mLs (130 mg total) into the skin every 12 (twelve) hours. Qty: 30 Syringe, Refills: 0    folic acid (FOLVITE) 1 MG tablet Take 1 tablet (1 mg total) by mouth daily. Qty: 30  tablet, Refills: 1    Prenatal Multivit-Min-Fe-FA (PRE-NATAL FORMULA) TABS Take 1 tablet by mouth daily. Qty: 30 each, Refills: 0      CONTINUE these medications which have CHANGED   Details  ipratropium (ATROVENT) 0.02 % nebulizer solution Take 2.5 mLs (0.5 mg total) by nebulization every 6 (six) hours as needed for wheezing or shortness of breath. Qty: 2.5 mL, Refills: 0    predniSONE (DELTASONE) 10 MG tablet Take 4 tablet daily Qty: 120 tablet, Refills: 0      CONTINUE these medications which have NOT CHANGED   Details  albuterol (PROVENTIL HFA;VENTOLIN HFA) 108 (90 BASE) MCG/ACT inhaler Inhale 2 puffs into the lungs every 4 (four) hours as needed for wheezing or shortness of breath. Qty: 1 Inhaler, Refills: 3      STOP taking these medications     azaTHIOprine (IMURAN) 50 MG tablet      mometasone-formoterol (DULERA) 100-5 MCG/ACT AERO      rivaroxaban (XARELTO) 20 MG TABS tablet      traMADol (ULTRAM) 50 MG tablet      cefdinir (OMNICEF) 300 MG capsule      CONDYLOX 0.5 % gel      guaiFENesin (MUCINEX) 600 MG 12 hr tablet      hydroxychloroquine (PLAQUENIL) 200 MG tablet      hydrOXYzine (ATARAX/VISTARIL) 10 MG tablet      lidocaine (XYLOCAINE) 5 % ointment  No Known Allergies Follow-up Information    Follow up with [facility]. Go on 09/13/2015.   Specialty:  Internal Medicine   Why:  at 2:00pm for an appointment with Dr Armen Pickup.    Contact information:   [address] [id number] mc [place] [address] [phone]      [facility]   Why:  Call [facility] to discuss options. [place] [facility] para que discutir opciones del Psychiatrist.    Contact information:   [address] [place], [place] [address] tele # [phone]       The results of significant diagnostics from this hospitalization (including imaging, microbiology, ancillary and laboratory) are  listed below for reference.    Significant Diagnostic Studies: Dg Chest 2 View  09/07/2015  CLINICAL DATA:  Acute onset of shortness of breath and generalized chest pain. Initial encounter. EXAM: CHEST  2 VIEW COMPARISON:  Chest radiograph from 09/05/2015 FINDINGS: The lungs are hypoexpanded. There is mild elevation of the right hemidiaphragm. Vascular congestion is noted. Increased interstitial markings raise concern for mild interstitial edema. There is no evidence of pleural effusion or pneumothorax. The heart is normal in size; the mediastinal contour is within normal limits. No acute osseous abnormalities are seen. IMPRESSION: Lungs hypoexpanded. Mild elevation of the right hemidiaphragm. Vascular congestion noted. Increased interstitial markings raise concern for mild interstitial edema. Electronically Signed   By: Roanna Raider M.D.   On: 09/07/2015 19:25   Dg Chest 2 View  09/05/2015  CLINICAL DATA:  Cough with body aches for 5 days EXAM: CHEST  2 VIEW COMPARISON:  07/12/2015.  04/01/2015 FINDINGS: Low volume film. The bilateral diffuse interstitial lung disease is again noted. There is patchy airspace opacity at the left base, stable to slightly progressed in the interval. The flame shaped opacity in the left upper lung persists without interval change. Cardiopericardial silhouette is at upper limits of normal for size. The visualized bony structures of the thorax are intact. IMPRESSION: No substantial change in the nearly 2 month interval since the prior study but left lung airspace disease and focal upper lobe opacity is new since 04/01/2015. CT chest may prove helpful to further evaluate. Electronically Signed   By: Kennith Center M.D.   On: 09/05/2015 15:10   US Ob Comp Less 14 Wks  09/05/2015  CLINICAL DATA:  Positive pregnancy test EXAM: OBSTETRIC <14 WK [place] AND TRANSVAGINAL OB US TECHNIQUE: Both transabdominal and transvaginal ultrasound examinations were performed for complete evaluation of  the gestation as well as the maternal uterus, adnexal regions, and pelvic cul-de-sac. Transvaginal technique was performed to assess early pregnancy. COMPARISON:  None. FINDINGS: Intrauterine gestational sac: Visualized/normal in shape. Yolk sac:  Not visualized Embryo:  Visualized Cardiac Activity: Visualized Heart Rate: 157  bpm MSD: 27.9  mm   Seven w   6  d CRL:  22.8  mm   9 w   0 d                  [place] EDC: 04/08/2016 Subchorionic hemorrhage: None visualized. A small amount of fluid in the cervical canal. Maternal uterus/adnexae: No adnexal masses.  No free fluid IMPRESSION: Nine week intrauterine pregnancy. Fetal heart rate 157 beats per minute. No acute maternal findings. Electronically Signed   By: Charlett Nose M.D.   On: 09/05/2015 18:13   US Ob Transvaginal  09/05/2015  CLINICAL DATA:  Positive pregnancy test EXAM: OBSTETRIC <14 WK [place]  AND TRANSVAGINAL OB US TECHNIQUE: Both transabdominal and transvaginal ultrasound examinations were performed for complete evaluation of the gestation as well as the maternal uterus, adnexal regions, and pelvic cul-de-sac. Transvaginal technique was performed to assess early pregnancy. COMPARISON:  None. FINDINGS: Intrauterine gestational sac: Visualized/normal in shape. Yolk sac:  Not visualized Embryo:  Visualized Cardiac Activity: Visualized Heart Rate: 157  bpm MSD: 27.9  mm   Seven w   6  d CRL:  22.8  mm   9 w   0 d                  [place] EDC: 04/08/2016 Subchorionic hemorrhage: None visualized. A small amount of fluid in the cervical canal. Maternal uterus/adnexae: No adnexal masses.  No free fluid IMPRESSION: Nine week intrauterine pregnancy. Fetal heart rate 157 beats per minute. No acute maternal findings. Electronically Signed   By: Charlett Nose M.D.   On: 09/05/2015 18:13    Microbiology: Recent Results (from the past 240 hour(s))  Culture, blood (routine x 2) Call MD if unable to obtain prior to antibiotics being given     Status: None (Preliminary result)    Collection Time: 09/06/15  6:31 AM  Result Value Ref Range Status   Specimen Description BLOOD LEFT ANTECUBITAL  Final   Special Requests BOTTLES DRAWN AEROBIC AND ANAEROBIC 5CC  Final   Culture NO GROWTH 3 DAYS  Final   Report Status PENDING  Incomplete  Culture, blood (routine x 2) Call MD if unable to obtain prior to antibiotics being given     Status: None (Preliminary result)   Collection Time: 09/06/15  6:42 AM  Result Value Ref Range Status   Specimen Description BLOOD BLOOD LEFT FOREARM  Final   Special Requests BOTTLES DRAWN AEROBIC AND ANAEROBIC 5CC  Final   Culture NO GROWTH 3 DAYS  Final   Report Status PENDING  Incomplete     Labs: Basic Metabolic Panel:  Recent Labs Lab 09/05/15 1536 09/05/15 2047 09/06/15 0644 09/07/15 0403 09/08/15 0440 09/09/15 0440  NA 140  --  139 140 141 140  K 4.4  --  4.4 4.3 4.1 4.3  CL 102  --  102 104 104 102  CO2 25  --  25 27 29 28   GLUCOSE 128*  --  120* 132* 128* 105*  BUN 13  --  6 8 12 12   CREATININE 0.52  --  0.36* 0.34* 0.44 0.35*  CALCIUM 8.7*  --  8.6* 8.7* 8.6* 8.7*  MG  --  1.8  --   --   --   --   PHOS  --  3.7  --   --   --   --    Liver Function Tests:  Recent Labs Lab 09/06/15 0644 09/07/15 0403 09/08/15 0440 09/09/15 0440  AST 22 17 32 20  ALT 16 16 32 26  ALKPHOS 52 49 47 44  BILITOT 0.4 0.3 0.6 0.3  PROT 6.5 6.0* 6.0* 5.7*  ALBUMIN 3.4* 3.1* 3.1* 3.0*   No results for input(s): LIPASE, AMYLASE in the last 168 hours. No results for input(s): AMMONIA in the last 168 hours. CBC:  Recent Labs Lab 09/05/15 1536 09/06/15 0644 09/07/15 0403  WBC 6.7 5.4 7.7  NEUTROABS 4.8 4.7  --   HGB 11.0* 11.1* 10.6*  HCT 39.3 40.1 38.2  MCV 81.2 81.7 80.9  PLT 256 269 283   Cardiac Enzymes: No results for input(s): CKTOTAL, CKMB, CKMBINDEX, TROPONINI in the  last 168 hours. BNP: BNP (last 3 results)  Recent Labs  09/11/14 1407 11/19/14 1349  BNP 12.0 8.7    ProBNP (last 3 results) No results  for input(s): PROBNP in the last 8760 hours.  CBG:  Recent Labs Lab 09/06/15 1700 09/07/15 0648 09/07/15 1116 09/08/15 0635 09/09/15 0542  GLUCAP 115* 124* 115* 119* 119*       Signed:  Hartley Barefoot A MD.  [facility] 09/09/2015, 10:56 AM

## 2015-09-09 NOTE — Progress Notes (Signed)
Talked to patient via interpreter Garcella; patient knows how to give herself lovenox injections because she was on it before; CM stressed the importance of f/u at the [facility]. Unable to make an apt with OB/GYN at this time, they wanted cash payment upfront. She know about [facility] for counseling and guidance concerning the pregnancy. She stated that she will tell her husband tonight about the pregnancy. Patient stated that she will go to the [facility] to get her prescriptions filled. The Pharmacist at the [facility] called, they have lovenox available. Patient does not qualify for the [facility] ( Medication Assistance Through [facility]) because she used this program in October 2016 and she can only use the funds once a year. Emotional support given. Abelino Derrick [facility] [phone]

## 2015-09-11 LAB — CULTURE, BLOOD (ROUTINE X 2)
CULTURE: NO GROWTH
Culture: NO GROWTH

## 2015-09-13 ENCOUNTER — Encounter: Payer: Self-pay | Admitting: Family Medicine

## 2015-09-13 ENCOUNTER — Ambulatory Visit: Payer: Self-pay | Attending: Family Medicine | Admitting: Family Medicine

## 2015-09-13 ENCOUNTER — Telehealth: Payer: Self-pay

## 2015-09-13 VITALS — BP 112/76 | HR 112 | Temp 98.8°F | Resp 20 | Wt 272.0 lb

## 2015-09-13 DIAGNOSIS — Z7952 Long term (current) use of systemic steroids: Secondary | ICD-10-CM

## 2015-09-13 DIAGNOSIS — Z3490 Encounter for supervision of normal pregnancy, unspecified, unspecified trimester: Secondary | ICD-10-CM | POA: Insufficient documentation

## 2015-09-13 DIAGNOSIS — Z331 Pregnant state, incidental: Secondary | ICD-10-CM

## 2015-09-13 DIAGNOSIS — G894 Chronic pain syndrome: Secondary | ICD-10-CM

## 2015-09-13 DIAGNOSIS — M329 Systemic lupus erythematosus, unspecified: Secondary | ICD-10-CM

## 2015-09-13 DIAGNOSIS — Z640 Problems related to unwanted pregnancy: Secondary | ICD-10-CM | POA: Insufficient documentation

## 2015-09-13 MED ORDER — PREDNISONE 10 MG PO TABS: 20.0000 mg | ORAL_TABLET | Freq: Every day | ORAL | Status: DC

## 2015-09-13 MED ORDER — TRAMADOL HCL 50 MG PO TABS: 50.0000 mg | ORAL_TABLET | Freq: Two times a day (BID) | ORAL | Status: DC | PRN

## 2015-09-13 NOTE — Assessment & Plan Note (Addendum)
Patient desires termination of high risk pregnancy I met with on site case manager and clinical social worker to discuss how much we can coordinate patient's termination. Of note, hospital case managers were unable to coordinate an appointment for termination since service is not provided at [facility].  Termination resources provided to patient, I have recommend the women's option's center at [facility] as [facility] believe [facility] be able to serve this patient given her complex medical history, specifically her respiratory and bleeding risk.

## 2015-09-13 NOTE — Progress Notes (Signed)
Subjective:  Patient ID: Donna Hall, female    DOB: 22-Aug-1985  Age: 30 y.o. MRN: [id number] Spanish interpreter utilized.   CC: High Risk Gestation and Hospitalization Follow-up   HPI Donna Hall has lupus, interstitial lung disease, rheumatoid arthritis, pulmonary embolism, currently pregnant    1. HFU pneumonia: she was hospitalized from 3/12-3/16/17. For CAP. She has completed Augmentin. She continues to take prednisone 40 mg daily. She has generalized aches and pains. She has take one tramadol since being home. Of note, she has held plaquenil and imuran due to her pregnany  2. Pregnant: she is currently 10 weeks and 1 day pregnant based on first trimester ultrasound. She denies abdominal pain and vaginal bleeding. She desires termination. She is most concerned about her pregnancy worsening her chronic pain associated with her autoimmune disease. She is uninsured and has not completed the application for the orange card or the [facility] discount. She has not contacted clinics for termination appointment. She and her husband speak [place]. She is illiterate. She relies on her 61 yo daughter to read for her.   Social History  Substance Use Topics  . Smoking status: Never Smoker   . Smokeless tobacco: Never Used  . Alcohol Use: No   OB History    Gravida Para Term Preterm AB TAB SAB Ectopic Multiple Living   4 3 3       3       Outpatient Prescriptions Prior to Visit  Medication Sig Dispense Refill  . albuterol (PROVENTIL HFA;VENTOLIN HFA) 108 (90 BASE) MCG/ACT inhaler Inhale 2 puffs into the lungs every 4 (four) hours as needed for wheezing or shortness of breath. (Patient not taking: Reported on 08/26/2015) 1 Inhaler 3  . amoxicillin-clavulanate (AUGMENTIN) 875-125 MG tablet Take 1 tablet by mouth 2 (two) times daily. 4 tablet 0  . enoxaparin (LOVENOX) 100 MG/ML injection Inject 1.3 mLs (130 mg total) into the skin every 12 (twelve) hours. 30 Syringe 0  .  folic acid (FOLVITE) 1 MG tablet Take 1 tablet (1 mg total) by mouth daily. 30 tablet 1  . ipratropium (ATROVENT) 0.02 % nebulizer solution Take 2.5 mLs (0.5 mg total) by nebulization every 6 (six) hours as needed for wheezing or shortness of breath. 2.5 mL 0  . predniSONE (DELTASONE) 10 MG tablet Take 4 tablet daily 120 tablet 0  . Prenatal Multivit-Min-Fe-FA (PRE-NATAL FORMULA) TABS Take 1 tablet by mouth daily. 30 each 0   No facility-administered medications prior to visit.    ROS Review of Systems  Constitutional: Negative for fever and chills.  Eyes: Negative for visual disturbance.  Respiratory: Negative for shortness of breath.   Cardiovascular: Negative for chest pain.  Gastrointestinal: Negative for abdominal pain and blood in stool.  Musculoskeletal: Negative for back pain and arthralgias.  Skin: Negative for rash.  Allergic/Immunologic: Negative for immunocompromised state.  Hematological: Negative for adenopathy. Does not bruise/bleed easily.  Psychiatric/Behavioral: Negative for suicidal ideas and dysphoric mood.    Objective:  BP 112/76 mmHg  Pulse 112  Temp(Src) 98.8 F (37.1 C)  Resp 20  Wt 272 lb (123.378 kg)  SpO2 95%  LMP 06/27/2015  BP/Weight 09/13/2015 09/09/2015 08/26/2015  Systolic BP 112 116 116  Diastolic BP 76 62 82  Wt. (Lbs) 272 275 278.8  BMI 53.12 53.71 49.4   Physical Exam  Constitutional: She is oriented to person, place, and time. She appears well-developed and well-nourished. No distress.  HENT:  Head: Normocephalic and atraumatic.  Cardiovascular:  Normal rate, regular rhythm, normal heart sounds and intact distal pulses.   Pulmonary/Chest: Effort normal and breath sounds normal.  diminished breast sounds diffusely   Musculoskeletal: She exhibits no edema.  Neurological: She is alert and oriented to person, place, and time.  Skin: Skin is warm and dry. No rash noted.  Psychiatric: She has a normal mood and affect.     Assessment &  Plan:   Kailany was seen today for high risk gestation and hospitalization follow-up.  Diagnoses and all orders for this visit:  Currently pregnant  Chronic pain syndrome -     traMADol (ULTRAM) 50 MG tablet; Take 1 tablet (50 mg total) by mouth every 12 (twelve) hours as needed.  Long term current use of systemic steroids -     Discontinue: predniSONE (DELTASONE) 10 MG tablet; Take 2 tablets (20 mg total) by mouth daily with breakfast. Take 4 tablet daily -     predniSONE (DELTASONE) 10 MG tablet; Take 2 tablets (20 mg total) by mouth daily with breakfast.  Systemic lupus erythematosus (HCC) -     Discontinue: predniSONE (DELTASONE) 10 MG tablet; Take 2 tablets (20 mg total) by mouth daily with breakfast. Take 4 tablet daily -     predniSONE (DELTASONE) 10 MG tablet; Take 2 tablets (20 mg total) by mouth daily with breakfast.    No orders of the defined types were placed in this encounter.    Follow-up: No Follow-up on file.   Dessa Phi MD

## 2015-09-13 NOTE — Telephone Encounter (Signed)
The patient was at the [facility] today and noted that she needed her oxygen tank refilled. This CM showed her how to get to [facility])  on [address] and met the patient at [employer]. Shelda Jakes, [facility] was able to interpret for the patient at the store as the patient only speaks spanish. The patient brought her oxygen concentrator, not her portable O2 tanks. She stated that now that she knows were the [facility] store is she will be able to bring her tanks to the store to have them refilled.   Greg,with [facility], was able to check the concentrator and did not report any problems. He put in a temporary filter and ordered an new one for the patient that she can pick up next Wednesday, 09/22/15 after 1100. The patient stated that she understood. Marya Amsler also provided her with the O2 tubing, nasal cannula and connectors.  The patient stated that she understood that she can come back to the store to get more tubing/cannula/connectors if needed and she can bring what she needs to the store and they will replace it.   The patient was very appreciative of the assistance.

## 2015-09-13 NOTE — Patient Instructions (Signed)
Donna Hall was seen today for high risk gestation and hospitalization follow-up.  Diagnoses and all orders for this visit:  Currently pregnant  Chronic pain syndrome -     traMADol (ULTRAM) 50 MG tablet; Take 1 tablet (50 mg total) by mouth every 12 (twelve) hours as needed.  Long term current use of systemic steroids -     predniSONE (DELTASONE) 10 MG tablet; Take 2 tablets (20 mg total) by mouth daily with breakfast. Take 4 tablet daily  Systemic lupus erythematosus (HCC) -     predniSONE (DELTASONE) 10 MG tablet; Take 2 tablets (20 mg total) by mouth daily with breakfast. Take 4 tablet daily   Pregnancy termination resources provided, I recommend the women's resource center at [facility] because of your lovenox use and chronic lung disease.  Please complete orange card and follow up with your rheumatologist at [facility] to restart medications for lupus and interstitial lung disease after termination.   F/u with me in 2 weeks  Dr. Armen Pickup

## 2015-09-15 ENCOUNTER — Encounter: Payer: Self-pay | Admitting: Critical Care Medicine

## 2015-09-15 ENCOUNTER — Ambulatory Visit: Payer: Self-pay | Attending: Critical Care Medicine | Admitting: Critical Care Medicine

## 2015-09-15 VITALS — BP 111/73 | HR 116 | Temp 98.0°F | Resp 20 | Ht 63.0 in | Wt 277.2 lb

## 2015-09-15 DIAGNOSIS — J9611 Chronic respiratory failure with hypoxia: Secondary | ICD-10-CM

## 2015-09-15 DIAGNOSIS — I10 Essential (primary) hypertension: Secondary | ICD-10-CM | POA: Insufficient documentation

## 2015-09-15 DIAGNOSIS — I2699 Other pulmonary embolism without acute cor pulmonale: Secondary | ICD-10-CM

## 2015-09-15 DIAGNOSIS — Z7952 Long term (current) use of systemic steroids: Secondary | ICD-10-CM

## 2015-09-15 DIAGNOSIS — J029 Acute pharyngitis, unspecified: Secondary | ICD-10-CM | POA: Insufficient documentation

## 2015-09-15 DIAGNOSIS — Z7901 Long term (current) use of anticoagulants: Secondary | ICD-10-CM | POA: Insufficient documentation

## 2015-09-15 DIAGNOSIS — L409 Psoriasis, unspecified: Secondary | ICD-10-CM | POA: Insufficient documentation

## 2015-09-15 DIAGNOSIS — F329 Major depressive disorder, single episode, unspecified: Secondary | ICD-10-CM | POA: Insufficient documentation

## 2015-09-15 DIAGNOSIS — J849 Interstitial pulmonary disease, unspecified: Secondary | ICD-10-CM | POA: Insufficient documentation

## 2015-09-15 DIAGNOSIS — R0902 Hypoxemia: Secondary | ICD-10-CM | POA: Insufficient documentation

## 2015-09-15 DIAGNOSIS — M329 Systemic lupus erythematosus, unspecified: Secondary | ICD-10-CM

## 2015-09-15 DIAGNOSIS — Z86711 Personal history of pulmonary embolism: Secondary | ICD-10-CM | POA: Insufficient documentation

## 2015-09-15 DIAGNOSIS — J45901 Unspecified asthma with (acute) exacerbation: Secondary | ICD-10-CM

## 2015-09-15 DIAGNOSIS — J189 Pneumonia, unspecified organism: Secondary | ICD-10-CM

## 2015-09-15 DIAGNOSIS — Z7951 Long term (current) use of inhaled steroids: Secondary | ICD-10-CM | POA: Insufficient documentation

## 2015-09-15 DIAGNOSIS — J45991 Cough variant asthma: Secondary | ICD-10-CM

## 2015-09-15 DIAGNOSIS — J8410 Pulmonary fibrosis, unspecified: Secondary | ICD-10-CM

## 2015-09-15 DIAGNOSIS — F419 Anxiety disorder, unspecified: Secondary | ICD-10-CM | POA: Insufficient documentation

## 2015-09-15 MED ORDER — PREDNISONE 10 MG PO TABS: ORAL_TABLET | ORAL | Status: DC

## 2015-09-15 MED ORDER — PREDNISONE 10 MG PO TABS: 30.0000 mg | ORAL_TABLET | Freq: Every day | ORAL | Status: DC

## 2015-09-15 MED FILL — predniSONE 10 MG TABS: 10 | 30 days supply | Qty: 67 | Fill #0

## 2015-09-15 NOTE — Assessment & Plan Note (Signed)
Chronic hypoxemia 2L exertion needed

## 2015-09-15 NOTE — Progress Notes (Signed)
Subjective:    Patient ID: Donna Hall, female    DOB: 24-Oct-1985, 30 y.o.   MRN: [id number]  HPI Subjective:     Patient ID: Donna Hall, female   DOB: May 27, 1986,    MRN: [id number]     Brief patient profile:  30 yo latino female never smoker followed previously at [facility] with dx of lupus on steroids x  Around 2011 on a floor of 5 mg daily and a ceiling of 20 mg referred by Dr Armen Pickup for ILD 07/22/2014 p admit  Admit date: 06/20/2014 Discharge date: 06/21/2014  Discharge Diagnoses:  Principal Problem:  Asthma exacerbation   INTERSTITIAL LUNG DISEASE  Psoriasis  Obesity, Class III, BMI 40-49.9 (morbid obesity)  Essential hypertension, benign  Pulmonary embolism  Anxiety and depression     History of Present Illness  07/22/2014 1st [facility] office visit/ Wert   Chief Complaint  Patient presents with  . Advice Only    Referred for Pulm Fibrosis eval: Pt has cough with yellow tinge with small blood. Pt  has sore throat. Pt denies chest tightness but does have wheezing. Pt took Nyquil last night for sore throat.  presently on 10 mg per day with active arthritis hands/ shoulders/ knees worse than usual  Cough x one month minimally discolored assoc with overt HB and nasal congestion  Symptoms better for a few hours p saba  rec Start dulera 100 Take 2 puffs first thing in am and then another 2 puffs about 12 hours later.  Try over the counter prilosec 20mg   Take 30-60 min before first meal of the day and Pepcid 20 mg one bedtime until  Return GERD diet    08/05/2014 f/u ov/Wert re: sob/cough / lupus on dulera 100 2bid but counter on 23  Chief Complaint  Patient presents with  . Follow-up    Pt states her cough is worse since the last visit- prod with yellow sputum.  She c/o swelling under her left eye and HA for the past 2 days.   confused with instructions re prednisone rx from er 08/03/14 whereas on 10 mg daily  at baseline since  08/03/14  Now on 50 mg daily and taper abruptly to zero was not the intent   Did not understand maint dulera vs prn saba not the same though given instructions in spanish/ written copy in spanish apparently can't read that well even in spanish and depends on others to read for her rx with zpak by er 08/03/14  rec Continue dulera 100 Take 2 puffs first thing in am and then another 2 puffs about 12 hours later.  Finish the zithromax Reduce the prednisone to 10 x 2 each am  Please see patient coordinator before you leave today  to schedule sinus CT asap  For breathing > Only use your albuterol (proventil/ yellow) as a rescue medication  For cough or pain anywhere take tramadol 50 up to 1-2 every 4 hours as needed  Pease schedule a follow up office visit in 2 weeks, sooner if needed with all your medications including over the counter meds and see your primary doctor as soon as possible to continue to coordinate all your care      07/2014 f/u ov/Wert re: cough / restrictive dz ? Lupus or all obesity?   Chief Complaint  Patient presents with  . Interstitial Lung Disease    FU  very confused with with all of her instructions/ multiple duplicate bottles with just  a few pills left   Prednisone back down to 10 mg per day she says but really unclear on this also     No obvious day to day or daytime variabilty or assoc  Cough or cp or chest tightness, subjective wheeze   symptoms. No unusual exp hx or h/o childhood pna/ asthma or knowledge of premature birth.  Sleeping ok without nocturnal  or early am exacerbation  of respiratory  c/o's or need for noct saba. Also denies any obvious fluctuation of symptoms with weather or environmental changes or other aggravating or alleviating factors except as outlined above   09/15/2015 Last seen 07/2014 by MWert at [facility] for lupus induced ILD.     Chief Complaint  Patient presents with  . Interstitial Lung Disease    FU   Hx ild lupus asthma.  freq  admits 2016 and last adm 09/05/2015. As of now:  Is pregnant.  Now continues dyspneic  On lovenox for PE ,  Prev on xarelto for PE.  PE for > 76yrs. No chest pain.  +cough , some mucus, notes some wheezing    Current Medications, Allergies, Complete Past Medical History, Past Surgical History, Family History, and Social History were reviewed in [employer] record.       ROS  Review of Systems  Constitutional: Positive for unexpected weight change.  HENT: Positive for sore throat and trouble swallowing.   Respiratory: Positive for cough, chest tightness, shortness of breath and wheezing.   Cardiovascular: Negative for chest pain.  Musculoskeletal: Positive for myalgias, joint swelling and arthralgias.       Objective:   Physical Exam  Filed Vitals:   09/15/15 1048  BP: 111/73  Pulse: 116  Temp: 98 F (36.7 C)  TempSrc: Oral  Resp: 20  Height: 5\' 3"  (1.6 m)  Weight: 277 lb 3.2 oz (125.737 kg)  SpO2: 97%    Gen: Pleasant,obese cushingoid hispanic Female , in no distress,  normal affect  ENT: No lesions,  mouth clear,  oropharynx clear, no postnasal drip  Neck: No JVD, no TMG, no carotid bruits  Lungs: No use of accessory muscles, no dullness to percussion, bibasilar rales, exp wheeze  Cardiovascular: RRR, heart sounds normal, no murmur or gallops, no peripheral edema  Abdomen: soft and NT, no HSM,  BS normal  Musculoskeletal: No deformities, no cyanosis or clubbing  Neuro: alert, non focal  Skin: Warm, no lesions or rashes  No results found.  Sats 86% with exertion sats 94% 2L exertion    Assessment & Plan:  I personally reviewed all images and lab data in the [facility] system as well as any outside material available during this office visit and agree with the  radiology impressions.   Pulmonary embolism (HCC) Hx of PE 2015 was told one year of anticoagulation., should have ended 10/2014 Plan  cont lovenox Referral Dr 11/2014 ? Long  term anticoagulation   CAP (community acquired pneumonia) Hx of CAP , now resolved Plan No further ABX  Asthma exacerbation Cough variant asthma  Recent exacerbation Plan  cont prednisone 30mg  daily Add dulera 200 two puff bid Prn saba   Cough variant asthma vs UACS See asthma assessment  INTERSTITIAL LUNG DISEASE ILD assessed [facility] Increase pred 30mg  per day  Chronic respiratory failure with hypoxia (HCC) Chronic hypoxemia 2L exertion needed    Joua was seen today for interstitial lung disease.  Diagnoses and all orders for this visit:  Asthma exacerbation  Long term  current use of systemic steroids -     Discontinue: predniSONE (DELTASONE) 10 MG tablet; Take 3 tablets (30 mg total) by mouth daily with breakfast. -     predniSONE (DELTASONE) 10 MG tablet; Take 30mg  daily for 7 days then 20mg  thereafter  Systemic lupus erythematosus (HCC) -     Discontinue: predniSONE (DELTASONE) 10 MG tablet; Take 3 tablets (30 mg total) by mouth daily with breakfast. -     predniSONE (DELTASONE) 10 MG tablet; Take 30mg  daily for 7 days then 20mg  thereafter  Pulmonary embolism, other (HCC) -     Ambulatory referral to Hematology  CAP (community acquired pneumonia)  Cough variant asthma vs UACS  INTERSTITIAL LUNG DISEASE  Chronic respiratory failure with hypoxia (HCC)

## 2015-09-15 NOTE — Assessment & Plan Note (Signed)
ILD assessed [facility] Increase pred 30mg  per day

## 2015-09-15 NOTE — Patient Instructions (Addendum)
Aumentar la prednisona 30 mg al da durante 1 semana y luego 20 mg al da y Personal assistant Comience [employer] puff dos veces al Arboriculturist en Lovenox por ahora El uso de oxigeno 1 Litro resoso y esfuerzo 2 litros Remision al Dr. Cyndie Chime para evalular el uso de diluyente de la sangre a largo plazo Devolver el Dr. Delford Field un solo el mes

## 2015-09-15 NOTE — Progress Notes (Signed)
Patient is here for Intersistial Lung Disease FU  Patient complain of bilateral pain in her knees and elbows. Pain has been present for 2 months. Patient describes pain as pins and needles and "grabbing" pain. Pain is currently scaled at a 9.  Patient denies any SOB, Wheezing, or Edema at this time.

## 2015-09-15 NOTE — Assessment & Plan Note (Signed)
Hx of PE 2015 was told one year of anticoagulation., should have ended 10/2014 Plan  cont lovenox Referral Dr Cyndie Chime ? Long term anticoagulation

## 2015-09-15 NOTE — Assessment & Plan Note (Signed)
See asthma assessment  

## 2015-09-15 NOTE — Assessment & Plan Note (Signed)
Cough variant asthma  Recent exacerbation Plan  cont prednisone 30mg  daily Add dulera 200 two puff bid Prn saba

## 2015-09-15 NOTE — Assessment & Plan Note (Signed)
Hx of CAP , now resolved Plan No further ABX

## 2015-09-20 MED FILL — traMADol HCL 50 MG TABS: 50 | 30 days supply | Qty: 60 | Fill #0

## 2015-09-27 ENCOUNTER — Ambulatory Visit: Payer: Self-pay | Attending: Family Medicine | Admitting: Family Medicine

## 2015-09-27 ENCOUNTER — Encounter: Payer: Self-pay | Admitting: Family Medicine

## 2015-09-27 DIAGNOSIS — Z7952 Long term (current) use of systemic steroids: Secondary | ICD-10-CM

## 2015-09-27 DIAGNOSIS — Z86711 Personal history of pulmonary embolism: Secondary | ICD-10-CM

## 2015-09-27 DIAGNOSIS — M358 Other specified systemic involvement of connective tissue: Secondary | ICD-10-CM

## 2015-09-27 DIAGNOSIS — M351 Other overlap syndromes: Secondary | ICD-10-CM

## 2015-09-27 DIAGNOSIS — Z7901 Long term (current) use of anticoagulants: Secondary | ICD-10-CM

## 2015-09-27 MED ORDER — PREDNISONE 10 MG PO TABS: 20.0000 mg | ORAL_TABLET | Freq: Every day | ORAL | Status: DC

## 2015-09-27 MED ORDER — RIVAROXABAN 20 MG PO TABS: 20.0000 mg | ORAL_TABLET | Freq: Every day | ORAL | Status: DC

## 2015-09-27 MED ORDER — AZATHIOPRINE 50 MG PO TABS: 250.0000 mg | ORAL_TABLET | Freq: Every day | ORAL | Status: DC

## 2015-09-27 MED ORDER — CELECOXIB 200 MG PO CAPS: 200.0000 mg | ORAL_CAPSULE | Freq: Two times a day (BID) | ORAL | Status: DC

## 2015-09-27 MED ORDER — OMEPRAZOLE 40 MG PO CPDR: 40.0000 mg | DELAYED_RELEASE_CAPSULE | Freq: Every day | ORAL | Status: DC

## 2015-09-27 MED ORDER — HYDROXYCHLOROQUINE SULFATE 200 MG PO TABS: 200.0000 mg | ORAL_TABLET | Freq: Two times a day (BID) | ORAL | Status: DC

## 2015-09-27 MED FILL — **XARELTO 20 MG TABLET: 20 MG | 30 days supply | Qty: 30 | Fill #1

## 2015-09-27 MED FILL — CELECOXIB 200 MG CAPSULE: 200 | 30 days supply | Qty: 60 | Fill #0

## 2015-09-27 MED FILL — azaTHIOprine 50 MG TABS: 50 | 30 days supply | Qty: 150 | Fill #0

## 2015-09-27 MED FILL — predniSONE 10 MG TABS: 10 | 30 days supply | Qty: 60 | Fill #0

## 2015-09-27 MED FILL — OMEPRAZOLE DR 40 MG CAPSULE: 40 | 30 days supply | Qty: 30 | Fill #0

## 2015-09-27 MED FILL — ?HYDROXYCHLOROQUINE 200 MG: 200 | 30 days supply | Qty: 60 | Fill #0

## 2015-09-27 NOTE — Assessment & Plan Note (Signed)
Pregnancy has been terminated

## 2015-09-27 NOTE — Assessment & Plan Note (Signed)
A: longterm use of steroids P: Add prilosec

## 2015-09-27 NOTE — Assessment & Plan Note (Signed)
Pregnancy has been terminated  

## 2015-09-27 NOTE — Assessment & Plan Note (Signed)
A: connective tissue disease  P: Restart plaquenil, imuran and celebrex Keep prednisone at 20 mg daily with goal of decreasing to 10 mg as tolerated Scheduled f/u rheumatology appt patient has not been seen in nearly 2 years

## 2015-09-27 NOTE — Progress Notes (Signed)
F/U hypoxemia  Taking medication as prescribed  No suicidal thoughts in the past two weeks  Pain scale #9 No tobacco user

## 2015-09-27 NOTE — Assessment & Plan Note (Signed)
A: history of PE at high risk of recurrent PE P: Continue xarelto

## 2015-09-27 NOTE — Progress Notes (Signed)
Subjective:  Patient ID: Donna Hall, female    DOB: 1986/05/06  Age: 30 y.o. MRN: [id number]  CC: Follow-up spanish interpreter used   HPI Donna Hall presents for follow up. She has mixed connective tissue disease, history of peripheral  PE, ambulatory O2 requirement    1. Recent abortion: she under went 11 weeks of gestation presenting for electric vacuum aspiration for induced abortion and IUD insertion at [facility] on 09/20/2015. She has lupus, rheumatoid arthritis, chronic steroids interstitial lung disease, history of PE. She is here today to transition back to her medications that were stopped when she was determined to be pregnant. She has been on lovenox to replace xarelto. She has stopped celebrex, plaquenil and  Imuran. She continue prednisone 20 mg daily. She admits to joint pains mostly in L shoulder and R knee. She has SOB and fatigue. No CP, fever or chills.   Social History  Substance Use Topics  . Smoking status: Never Smoker   . Smokeless tobacco: Never Used  . Alcohol Use: No   Outpatient Prescriptions Prior to Visit  Medication Sig Dispense Refill  . albuterol (PROVENTIL HFA;VENTOLIN HFA) 108 (90 BASE) MCG/ACT inhaler Inhale 2 puffs into the lungs every 4 (four) hours as needed for wheezing or shortness of breath. 1 Inhaler 3  . enoxaparin (LOVENOX) 100 MG/ML injection Inject 1.3 mLs (130 mg total) into the skin every 12 (twelve) hours. 30 Syringe 0  . folic acid (FOLVITE) 1 MG tablet Take 1 tablet (1 mg total) by mouth daily. 30 tablet 1  . predniSONE (DELTASONE) 10 MG tablet Take 30mg  daily for 7 days then 20mg  thereafter 100 tablet 6  . Prenatal Multivit-Min-Fe-FA (PRE-NATAL FORMULA) TABS Take 1 tablet by mouth daily. 30 each 0  . traMADol (ULTRAM) 50 MG tablet Take 1 tablet (50 mg total) by mouth every 12 (twelve) hours as needed. 60 tablet 0   No facility-administered medications prior to visit.    ROS Review of Systems  Constitutional:  Positive for fatigue and unexpected weight change.  HENT: Positive for sore throat and trouble swallowing.   Respiratory: Positive for cough, chest tightness, shortness of breath and wheezing.   Cardiovascular: Negative for chest pain.  Gastrointestinal: Negative for abdominal pain.  Genitourinary: Positive for vaginal bleeding (scant ). Negative for pelvic pain.  Musculoskeletal: Positive for myalgias, joint swelling and arthralgias.  Neurological: Positive for headaches.  Psychiatric/Behavioral: Positive for sleep disturbance.    Objective:  BP 103/69 mmHg  Pulse 110  Temp(Src) 98.9 F (37.2 C)  Resp 16  Wt 280 lb (127.007 kg)  SpO2 94%  Breastfeeding? No  BP/Weight 09/27/2015 09/15/2015 09/13/2015  Systolic BP 103 111 112  Diastolic BP 69 73 76  Wt. (Lbs) 280 277.2 272  BMI 49.61 49.12 53.12   Physical Exam  Constitutional: She is oriented to person, place, and time. She appears well-developed and well-nourished. No distress.  Cushingoid appearance   HENT:  Head: Normocephalic and atraumatic.  Cardiovascular: Normal rate, regular rhythm, normal heart sounds and intact distal pulses.   Pulmonary/Chest: Effort normal and breath sounds normal.  Musculoskeletal: She exhibits no edema.  Neurological: She is alert and oriented to person, place, and time.  Skin: Skin is warm and dry. No rash noted.  Psychiatric: She has a normal mood and affect.    Assessment & Plan:   There are no diagnoses linked to this encounter. Adelee was seen today for follow-up.  Diagnoses and all orders for this  visit:  Connective tissue disease overlap syndrome (HCC) -     Discontinue: hydroxychloroquine (PLAQUENIL) 200 MG tablet; Take 1 tablet (200 mg total) by mouth 2 (two) times daily. -     azaTHIOprine (IMURAN) 50 MG tablet; Take 5 tablets (250 mg total) by mouth daily. -     celecoxib (CELEBREX) 200 MG capsule; Take 1 capsule (200 mg total) by mouth 2 (two) times daily. -     Discontinue:  predniSONE (DELTASONE) 10 MG tablet; Take 2 tablets (20 mg total) by mouth daily with breakfast. Take 30mg  daily for 7 days then 20mg  thereafter -     hydroxychloroquine (PLAQUENIL) 200 MG tablet; Take 1 tablet (200 mg total) by mouth 2 (two) times daily. -     predniSONE (DELTASONE) 10 MG tablet; Take 2 tablets (20 mg total) by mouth daily with breakfast.  Long term current use of systemic steroids -     Discontinue: predniSONE (DELTASONE) 10 MG tablet; Take 2 tablets (20 mg total) by mouth daily with breakfast. Take 30mg  daily for 7 days then 20mg  thereafter -     predniSONE (DELTASONE) 10 MG tablet; Take 2 tablets (20 mg total) by mouth daily with breakfast. -     omeprazole (PRILOSEC) 40 MG capsule; Take 1 capsule (40 mg total) by mouth daily.  Chronic anticoagulation -     rivaroxaban (XARELTO) 20 MG TABS tablet; Take 1 tablet (20 mg total) by mouth daily with supper.  History of pulmonary embolism -     rivaroxaban (XARELTO) 20 MG TABS tablet; Take 1 tablet (20 mg total) by mouth daily with supper.   Meds ordered this encounter  Medications  . DISCONTD: hydroxychloroquine (PLAQUENIL) 200 MG tablet    Sig: Take 1 tablet (200 mg total) by mouth 2 (two) times daily.    Dispense:  60 tablet    Refill:  5  . azaTHIOprine (IMURAN) 50 MG tablet    Sig: Take 5 tablets (250 mg total) by mouth daily.    Dispense:  150 tablet    Refill:  5  . celecoxib (CELEBREX) 200 MG capsule    Sig: Take 1 capsule (200 mg total) by mouth 2 (two) times daily.    Dispense:  60 capsule    Refill:  5  . DISCONTD: predniSONE (DELTASONE) 10 MG tablet    Sig: Take 2 tablets (20 mg total) by mouth daily with breakfast. Take 30mg  daily for 7 days then 20mg  thereafter    Dispense:  60 tablet    Refill:  5  . rivaroxaban (XARELTO) 20 MG TABS tablet    Sig: Take 1 tablet (20 mg total) by mouth daily with supper.    Dispense:  30 tablet    Refill:  5  . hydroxychloroquine (PLAQUENIL) 200 MG tablet    Sig:  Take 1 tablet (200 mg total) by mouth 2 (two) times daily.    Dispense:  60 tablet    Refill:  5    Spanish language please  . predniSONE (DELTASONE) 10 MG tablet    Sig: Take 2 tablets (20 mg total) by mouth daily with breakfast.    Dispense:  60 tablet    Refill:  5  . omeprazole (PRILOSEC) 40 MG capsule    Sig: Take 1 capsule (40 mg total) by mouth daily.    Dispense:  30 capsule    Refill:  3    Follow-up: No Follow-up on file.   MD

## 2015-09-27 NOTE — Patient Instructions (Addendum)
Donna Hall was seen today for follow-up.  Diagnoses and all orders for this visit:  Connective tissue disease overlap syndrome (HCC) -     Discontinue: hydroxychloroquine (PLAQUENIL) 200 MG tablet; Take 1 tablet (200 mg total) by mouth 2 (two) times daily. -     azaTHIOprine (IMURAN) 50 MG tablet; Take 5 tablets (250 mg total) by mouth daily. -     celecoxib (CELEBREX) 200 MG capsule; Take 1 capsule (200 mg total) by mouth 2 (two) times daily. -     Discontinue: predniSONE (DELTASONE) 10 MG tablet; Take 2 tablets (20 mg total) by mouth daily with breakfast. Take 30mg  daily for 7 days then 20mg  thereafter -     hydroxychloroquine (PLAQUENIL) 200 MG tablet; Take 1 tablet (200 mg total) by mouth 2 (two) times daily. -     predniSONE (DELTASONE) 10 MG tablet; Take 2 tablets (20 mg total) by mouth daily with breakfast.  Long term current use of systemic steroids -     Discontinue: predniSONE (DELTASONE) 10 MG tablet; Take 2 tablets (20 mg total) by mouth daily with breakfast. Take 30mg  daily for 7 days then 20mg  thereafter -     predniSONE (DELTASONE) 10 MG tablet; Take 2 tablets (20 mg total) by mouth daily with breakfast. -     omeprazole (PRILOSEC) 40 MG capsule; Take 1 capsule (40 mg total) by mouth daily.  Chronic anticoagulation -     rivaroxaban (XARELTO) 20 MG TABS tablet; Take 1 tablet (20 mg total) by mouth daily with supper.  History of pulmonary embolism -     rivaroxaban (XARELTO) 20 MG TABS tablet; Take 1 tablet (20 mg total) by mouth daily with supper.   Rheumatology follow up appointment made for  [facility] Donna Hall,   [facility]    February 22, 2016 at 1:30 PM   This is the first available.  You will be called and informed if a sooner appointment is available. Please let me know final date of appointment so we can help arrange transportation   F/u in 3 weeks for IUD string check and to f/u breathing   Dr. [place]

## 2015-10-06 ENCOUNTER — Telehealth: Payer: Self-pay | Admitting: Family Medicine

## 2015-10-06 DIAGNOSIS — G894 Chronic pain syndrome: Secondary | ICD-10-CM

## 2015-10-06 NOTE — Telephone Encounter (Signed)
Patient called and requested a med refill for traMADol (ULTRAM) 50 MG tablet. Please f/u

## 2015-10-09 ENCOUNTER — Encounter (HOSPITAL_COMMUNITY): Payer: Self-pay

## 2015-10-09 ENCOUNTER — Emergency Department (HOSPITAL_COMMUNITY): Payer: Medicaid Other

## 2015-10-09 ENCOUNTER — Inpatient Hospital Stay (HOSPITAL_COMMUNITY): Payer: Medicaid Other

## 2015-10-09 ENCOUNTER — Inpatient Hospital Stay (HOSPITAL_COMMUNITY)
Admission: EM | Admit: 2015-10-09 | Discharge: 2015-10-12 | DRG: 193 | Disposition: A | Discharge status: Home / Self Care | Payer: Medicaid Other | Attending: Internal Medicine | Admitting: Internal Medicine

## 2015-10-09 DIAGNOSIS — J189 Pneumonia, unspecified organism: Secondary | ICD-10-CM | POA: Diagnosis present

## 2015-10-09 DIAGNOSIS — Z9981 Dependence on supplemental oxygen: Secondary | ICD-10-CM | POA: Diagnosis not present

## 2015-10-09 DIAGNOSIS — Z7952 Long term (current) use of systemic steroids: Secondary | ICD-10-CM

## 2015-10-09 DIAGNOSIS — D649 Anemia, unspecified: Secondary | ICD-10-CM | POA: Diagnosis present

## 2015-10-09 DIAGNOSIS — Z79899 Other long term (current) drug therapy: Secondary | ICD-10-CM

## 2015-10-09 DIAGNOSIS — F329 Major depressive disorder, single episode, unspecified: Secondary | ICD-10-CM | POA: Diagnosis present

## 2015-10-09 DIAGNOSIS — M1990 Unspecified osteoarthritis, unspecified site: Secondary | ICD-10-CM | POA: Diagnosis present

## 2015-10-09 DIAGNOSIS — IMO0002 Reserved for concepts with insufficient information to code with codable children: Secondary | ICD-10-CM | POA: Diagnosis present

## 2015-10-09 DIAGNOSIS — M329 Systemic lupus erythematosus, unspecified: Secondary | ICD-10-CM | POA: Diagnosis present

## 2015-10-09 DIAGNOSIS — E66813 Obesity, class 3: Secondary | ICD-10-CM | POA: Diagnosis present

## 2015-10-09 DIAGNOSIS — J45901 Unspecified asthma with (acute) exacerbation: Secondary | ICD-10-CM | POA: Diagnosis present

## 2015-10-09 DIAGNOSIS — G894 Chronic pain syndrome: Secondary | ICD-10-CM | POA: Diagnosis present

## 2015-10-09 DIAGNOSIS — M797 Fibromyalgia: Secondary | ICD-10-CM | POA: Diagnosis present

## 2015-10-09 DIAGNOSIS — R0600 Dyspnea, unspecified: Secondary | ICD-10-CM | POA: Diagnosis present

## 2015-10-09 DIAGNOSIS — Z8249 Family history of ischemic heart disease and other diseases of the circulatory system: Secondary | ICD-10-CM | POA: Diagnosis not present

## 2015-10-09 DIAGNOSIS — G4733 Obstructive sleep apnea (adult) (pediatric): Secondary | ICD-10-CM | POA: Diagnosis present

## 2015-10-09 DIAGNOSIS — J849 Interstitial pulmonary disease, unspecified: Secondary | ICD-10-CM | POA: Diagnosis present

## 2015-10-09 DIAGNOSIS — Z86711 Personal history of pulmonary embolism: Secondary | ICD-10-CM | POA: Diagnosis present

## 2015-10-09 DIAGNOSIS — R05 Cough: Secondary | ICD-10-CM | POA: Diagnosis present

## 2015-10-09 DIAGNOSIS — Z6841 Body Mass Index (BMI) 40.0 and over, adult: Secondary | ICD-10-CM | POA: Diagnosis not present

## 2015-10-09 DIAGNOSIS — J9611 Chronic respiratory failure with hypoxia: Secondary | ICD-10-CM | POA: Diagnosis present

## 2015-10-09 DIAGNOSIS — F419 Anxiety disorder, unspecified: Secondary | ICD-10-CM | POA: Diagnosis present

## 2015-10-09 DIAGNOSIS — F32A Depression, unspecified: Secondary | ICD-10-CM | POA: Diagnosis present

## 2015-10-09 DIAGNOSIS — J8410 Pulmonary fibrosis, unspecified: Secondary | ICD-10-CM | POA: Diagnosis present

## 2015-10-09 DIAGNOSIS — J9621 Acute and chronic respiratory failure with hypoxia: Secondary | ICD-10-CM | POA: Diagnosis present

## 2015-10-09 DIAGNOSIS — M7989 Other specified soft tissue disorders: Secondary | ICD-10-CM

## 2015-10-09 DIAGNOSIS — Z833 Family history of diabetes mellitus: Secondary | ICD-10-CM | POA: Diagnosis not present

## 2015-10-09 DIAGNOSIS — M069 Rheumatoid arthritis, unspecified: Secondary | ICD-10-CM | POA: Diagnosis present

## 2015-10-09 DIAGNOSIS — R110 Nausea: Secondary | ICD-10-CM | POA: Diagnosis present

## 2015-10-09 DIAGNOSIS — I10 Essential (primary) hypertension: Secondary | ICD-10-CM | POA: Diagnosis present

## 2015-10-09 DIAGNOSIS — Z7901 Long term (current) use of anticoagulants: Secondary | ICD-10-CM

## 2015-10-09 DIAGNOSIS — R059 Cough, unspecified: Secondary | ICD-10-CM | POA: Diagnosis present

## 2015-10-09 LAB — CBC
HCT: 38.1 % (ref 36.0–46.0)
Hemoglobin: 10.7 g/dL — ABNORMAL LOW (ref 12.0–15.0)
MCH: 22.7 pg — ABNORMAL LOW (ref 26.0–34.0)
MCHC: 28.1 g/dL — ABNORMAL LOW (ref 30.0–36.0)
MCV: 80.7 fL (ref 78.0–100.0)
PLATELETS: 345 10*3/uL (ref 150–400)
RBC: 4.72 MIL/uL (ref 3.87–5.11)
RDW: 16.8 % — ABNORMAL HIGH (ref 11.5–15.5)
WBC: 10.7 10*3/uL — AB (ref 4.0–10.5)

## 2015-10-09 LAB — INFLUENZA PANEL BY PCR (TYPE A & B)
H1N1 flu by pcr: NOT DETECTED
INFLAPCR: NEGATIVE
Influenza B By PCR: NEGATIVE

## 2015-10-09 LAB — COMPREHENSIVE METABOLIC PANEL
ALT: 22 U/L (ref 14–54)
AST: 30 U/L (ref 15–41)
Albumin: 3.8 g/dL (ref 3.5–5.0)
Alkaline Phosphatase: 61 U/L (ref 38–126)
Anion gap: 10 (ref 5–15)
BUN: 20 mg/dL (ref 6–20)
CHLORIDE: 107 mmol/L (ref 101–111)
CO2: 25 mmol/L (ref 22–32)
CREATININE: 0.61 mg/dL (ref 0.44–1.00)
Calcium: 9 mg/dL (ref 8.9–10.3)
Glucose, Bld: 122 mg/dL — ABNORMAL HIGH (ref 65–99)
Potassium: 3.8 mmol/L (ref 3.5–5.1)
Sodium: 142 mmol/L (ref 135–145)
Total Bilirubin: 0.5 mg/dL (ref 0.3–1.2)
Total Protein: 6.6 g/dL (ref 6.5–8.1)

## 2015-10-09 LAB — STREP PNEUMONIAE URINARY ANTIGEN: STREP PNEUMO URINARY ANTIGEN: NEGATIVE

## 2015-10-09 LAB — URINALYSIS, ROUTINE W REFLEX MICROSCOPIC
GLUCOSE, UA: NEGATIVE mg/dL
Ketones, ur: 15 mg/dL — AB
LEUKOCYTES UA: NEGATIVE
NITRITE: NEGATIVE
PROTEIN: 30 mg/dL — AB
Specific Gravity, Urine: 1.03 (ref 1.005–1.030)
pH: 5.5 (ref 5.0–8.0)

## 2015-10-09 LAB — MRSA PCR SCREENING: MRSA by PCR: NEGATIVE

## 2015-10-09 LAB — I-STAT ARTERIAL BLOOD GAS, ED
ACID-BASE EXCESS: 3 mmol/L — AB (ref 0.0–2.0)
Bicarbonate: 30.3 mEq/L — ABNORMAL HIGH (ref 20.0–24.0)
O2 Saturation: 98 %
PO2 ART: 124 mmHg — AB (ref 80.0–100.0)
TCO2: 32 mmol/L (ref 0–100)
pCO2 arterial: 57.2 mmHg (ref 35.0–45.0)
pH, Arterial: 7.333 — ABNORMAL LOW (ref 7.350–7.450)

## 2015-10-09 LAB — URINE MICROSCOPIC-ADD ON: WBC, UA: NONE SEEN WBC/hpf (ref 0–5)

## 2015-10-09 LAB — PROTIME-INR
INR: 1.19 (ref 0.00–1.49)
PROTHROMBIN TIME: 15.3 s — AB (ref 11.6–15.2)

## 2015-10-09 LAB — LIPASE, BLOOD: LIPASE: 47 U/L (ref 11–51)

## 2015-10-09 LAB — I-STAT CG4 LACTIC ACID, ED: LACTIC ACID, VENOUS: 1.24 mmol/L (ref 0.5–2.0)

## 2015-10-09 LAB — D-DIMER, QUANTITATIVE: D-Dimer, Quant: 1.99 ug/mL-FEU — ABNORMAL HIGH (ref 0.00–0.50)

## 2015-10-09 LAB — I-STAT BETA HCG BLOOD, ED (MC, WL, AP ONLY)

## 2015-10-09 MED ORDER — ALBUTEROL (5 MG/ML) CONTINUOUS INHALATION SOLN
10.0000 mg | INHALATION_SOLUTION | RESPIRATORY_TRACT | Status: AC
  Administered 2015-10-09: 10 mg via RESPIRATORY_TRACT
  Filled 2015-10-09: qty 20

## 2015-10-09 MED ORDER — CELECOXIB 200 MG PO CAPS
200.0000 mg | ORAL_CAPSULE | Freq: Two times a day (BID) | ORAL | Status: DC
  Administered 2015-10-09 – 2015-10-12 (×6): 200 mg via ORAL
  Filled 2015-10-09 (×9): qty 1

## 2015-10-09 MED ORDER — VANCOMYCIN HCL IN DEXTROSE 1-5 GM/200ML-% IV SOLN
1000.0000 mg | Freq: Three times a day (TID) | INTRAVENOUS | Status: DC
  Administered 2015-10-09 – 2015-10-10 (×2): 1000 mg via INTRAVENOUS
  Filled 2015-10-09 (×4): qty 200

## 2015-10-09 MED ORDER — CEFEPIME HCL 2 G IJ SOLR
2.0000 g | Freq: Once | INTRAMUSCULAR | Status: AC
  Administered 2015-10-09: 2 g via INTRAVENOUS
  Filled 2015-10-09: qty 2

## 2015-10-09 MED ORDER — TRAMADOL HCL 50 MG PO TABS
50.0000 mg | ORAL_TABLET | Freq: Two times a day (BID) | ORAL | Status: DC | PRN
  Administered 2015-10-09 – 2015-10-11 (×3): 50 mg via ORAL
  Filled 2015-10-09 (×4): qty 1

## 2015-10-09 MED ORDER — MORPHINE SULFATE (PF) 2 MG/ML IV SOLN: 1.0000 mg | INTRAVENOUS | Status: DC | PRN

## 2015-10-09 MED ORDER — PREDNISONE 20 MG PO TABS
60.0000 mg | ORAL_TABLET | Freq: Once | ORAL | Status: AC
  Administered 2015-10-09: 60 mg via ORAL
  Filled 2015-10-09: qty 3

## 2015-10-09 MED ORDER — SODIUM CHLORIDE 0.9 % IV SOLN
2500.0000 mg | Freq: Once | INTRAVENOUS | Status: AC
  Administered 2015-10-09: 2500 mg via INTRAVENOUS
  Filled 2015-10-09: qty 2500

## 2015-10-09 MED ORDER — DEXTROSE 5 % IV SOLN
2.0000 g | Freq: Three times a day (TID) | INTRAVENOUS | Status: DC
  Administered 2015-10-09 – 2015-10-11 (×6): 2 g via INTRAVENOUS
  Filled 2015-10-09 (×8): qty 2

## 2015-10-09 MED ORDER — ONDANSETRON HCL 4 MG/2ML IJ SOLN
4.0000 mg | Freq: Three times a day (TID) | INTRAMUSCULAR | Status: AC | PRN
Start: 2015-10-09 — End: 2015-10-09

## 2015-10-09 MED ORDER — SENNOSIDES-DOCUSATE SODIUM 8.6-50 MG PO TABS: 1.0000 | ORAL_TABLET | Freq: Every evening | ORAL | Status: DC | PRN

## 2015-10-09 MED ORDER — ACETAMINOPHEN 325 MG PO TABS: 650.0000 mg | ORAL_TABLET | Freq: Four times a day (QID) | ORAL | Status: DC | PRN

## 2015-10-09 MED ORDER — PREDNISONE 20 MG PO TABS
40.0000 mg | ORAL_TABLET | Freq: Every day | ORAL | Status: DC
  Administered 2015-10-10 – 2015-10-12 (×3): 40 mg via ORAL
  Filled 2015-10-09 (×3): qty 2

## 2015-10-09 MED ORDER — IOPAMIDOL (ISOVUE-370) INJECTION 76%
INTRAVENOUS | Status: AC
  Administered 2015-10-09: 100 mL
  Filled 2015-10-09: qty 100

## 2015-10-09 MED ORDER — IPRATROPIUM-ALBUTEROL 0.5-2.5 (3) MG/3ML IN SOLN
3.0000 mL | Freq: Once | RESPIRATORY_TRACT | Status: AC
  Administered 2015-10-09: 3 mL via RESPIRATORY_TRACT
  Filled 2015-10-09: qty 3

## 2015-10-09 MED ORDER — ACETAMINOPHEN 650 MG RE SUPP: 650.0000 mg | Freq: Four times a day (QID) | RECTAL | Status: DC | PRN

## 2015-10-09 MED ORDER — PANTOPRAZOLE SODIUM 40 MG PO TBEC
40.0000 mg | DELAYED_RELEASE_TABLET | Freq: Every day | ORAL | Status: DC
  Administered 2015-10-09 – 2015-10-12 (×4): 40 mg via ORAL
  Filled 2015-10-09 (×4): qty 1

## 2015-10-09 MED ORDER — KETOROLAC TROMETHAMINE 30 MG/ML IJ SOLN
15.0000 mg | Freq: Once | INTRAMUSCULAR | Status: AC
  Administered 2015-10-09: 15 mg via INTRAVENOUS
  Filled 2015-10-09: qty 1

## 2015-10-09 MED ORDER — HYDROXYCHLOROQUINE SULFATE 200 MG PO TABS
200.0000 mg | ORAL_TABLET | Freq: Two times a day (BID) | ORAL | Status: DC
  Administered 2015-10-09 – 2015-10-12 (×6): 200 mg via ORAL
  Filled 2015-10-09 (×6): qty 1

## 2015-10-09 MED ORDER — HYDROCODONE-ACETAMINOPHEN 5-325 MG PO TABS
1.0000 | ORAL_TABLET | ORAL | Status: DC | PRN
  Administered 2015-10-09 – 2015-10-11 (×9): 2 via ORAL
  Filled 2015-10-09 (×9): qty 2

## 2015-10-09 MED ORDER — MAGNESIUM CITRATE PO SOLN: 1.0000 | Freq: Once | ORAL | Status: DC | PRN

## 2015-10-09 MED ORDER — ONDANSETRON HCL 4 MG/2ML IJ SOLN: 4.0000 mg | Freq: Once | INTRAMUSCULAR | Status: DC

## 2015-10-09 MED ORDER — ONDANSETRON 4 MG PO TBDP
4.0000 mg | ORAL_TABLET | Freq: Once | ORAL | Status: AC | PRN
  Administered 2015-10-09: 4 mg via ORAL

## 2015-10-09 MED ORDER — ONDANSETRON 4 MG PO TBDP
ORAL_TABLET | ORAL | Status: AC
Start: 2015-10-09 — End: 2015-10-09
  Filled 2015-10-09: qty 1

## 2015-10-09 MED ORDER — AZATHIOPRINE 50 MG PO TABS
250.0000 mg | ORAL_TABLET | Freq: Every day | ORAL | Status: DC
  Administered 2015-10-09 – 2015-10-12 (×4): 250 mg via ORAL
  Filled 2015-10-09 (×4): qty 5

## 2015-10-09 MED ORDER — IPRATROPIUM-ALBUTEROL 0.5-2.5 (3) MG/3ML IN SOLN
3.0000 mL | Freq: Four times a day (QID) | RESPIRATORY_TRACT | Status: DC
  Administered 2015-10-09 – 2015-10-10 (×6): 3 mL via RESPIRATORY_TRACT
  Filled 2015-10-09 (×6): qty 3

## 2015-10-09 MED ORDER — SODIUM CHLORIDE 0.9 % IV BOLUS (SEPSIS)
1000.0000 mL | Freq: Once | INTRAVENOUS | Status: AC
Start: 2015-10-09 — End: 2015-10-09
  Administered 2015-10-09: 1000 mL via INTRAVENOUS

## 2015-10-09 MED ORDER — RIVAROXABAN 20 MG PO TABS
20.0000 mg | ORAL_TABLET | Freq: Every day | ORAL | Status: DC
  Administered 2015-10-09 – 2015-10-11 (×3): 20 mg via ORAL
  Filled 2015-10-09 (×3): qty 1

## 2015-10-09 MED ORDER — SODIUM CHLORIDE 0.9 % IV BOLUS (SEPSIS): 1000.0000 mL | Freq: Once | INTRAVENOUS | Status: DC

## 2015-10-09 MED ORDER — ALBUTEROL SULFATE (2.5 MG/3ML) 0.083% IN NEBU: 2.5000 mg | INHALATION_SOLUTION | RESPIRATORY_TRACT | Status: AC | PRN

## 2015-10-09 MED ORDER — BISACODYL 10 MG RE SUPP
10.0000 mg | Freq: Every day | RECTAL | Status: DC | PRN
Start: 2015-10-09 — End: 2015-10-12

## 2015-10-09 MED ORDER — FENTANYL CITRATE (PF) 100 MCG/2ML IJ SOLN
50.0000 ug | Freq: Once | INTRAMUSCULAR | Status: DC
  Filled 2015-10-09: qty 2

## 2015-10-09 NOTE — ED Notes (Signed)
Patient's O2 Sat initially remained at 94% while walking, but eventually dropped to 87%.

## 2015-10-09 NOTE — ED Provider Notes (Signed)
CSN: [id number]     Arrival date & time 10/09/15  0256 History   First MD Initiated Contact with Patient 10/09/15 [phone]     Chief Complaint  Patient presents with  . Nausea     (Consider location/radiation/quality/duration/timing/severity/associated sxs/prior Treatment) The history is provided by the patient and medical records. The history is limited by a language barrier. A language interpreter was used.     Donna Hall is a 30 y.o. female with history of pulmonary embolism on Xarelto with intermittent compliance, pulmonary fibrosis, interstitial lung disease, cough variant asthma, rheumatoid arthritis, lupus, she presents to the emergency room with initial complaints of generalized body aches and nausea x 1d, however after being in the ER for several hours, she complained of shortness of breath with minimal exertion, fatigue, productive cough with yellow sputum and wheeze that has worsened over the last 3 days with associated fatigue.  She denies hemoptysis, fever, chills, sweats, vomiting, diarrhea, abdominal pain, chest pain.  She also complains of mild headache and body aches and joint pain which is severe, rated 10 out of 10.  Per chart review these are chronic complaints. She recently got a refill of her tramadol, however it has not helped. She states that she is currently on 30 mg of prednisone daily, she's been doing nightly breathing treatments and increased frequency as needed for shortness of breath over the past 3 days without much relief. She states she is currently compliant with her Xarelto.  She has no other complaints.  Pt was discharged from [facility] less than one month ago with similar complaints, dx of CAP, discharged on augmentin.  She was seen by [facility] for follow-up, where her prednisone was increased to 30 mg for one week, then 20 mg thereafter.  She was prescribed dulera and was told to use albuterol as needed. Oral anticoagulation was continued, and there  was a notation of need for supplemental oxygen with exertion however patient does not appear to be currently using oxygen.   Past Medical History  Diagnosis Date  . Psoriasis 2010    as a child  . Chronic pain disorder   . Obesity   . Chronic steroid use 2010  . Lupus (HCC) 2000  . Lupus (systemic lupus erythematosus) (HCC) 2010  . Rheumatoid arthritis(714.0) 2010  . Hypertension 2010  . Pneumonia "several times"  . Interstitial lung disease (HCC)     [name] 05/15/2014  . On home oxygen therapy     "2L; 24/7" (05/15/2014)  . Pulmonary embolism (HCC)     hx/notes 05/15/2014  . History of blood transfusion     "because white cells ate the red cells"  . Headache     "just when I have the pain from aching bones and psorasis and/or lupus symptoms" (05/15/2104)  . 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 Asthma   . Gestational diabetes 2006  . Depression   . Sleep apnea    Past Surgical History  Procedure Laterality Date  . Thigh / knee soft tissue biopsy Right 2011    thigh  . Cesarean section  2010   Family History  Problem Relation Age of Onset  . Diabetes Mother   . Hypertension Mother   . Cancer Neg Hx   . Early death Neg Hx   . Heart disease Neg Hx    Social History  Substance Use Topics  . Smoking status: Never  Smoker   . Smokeless tobacco: Never Used  . Alcohol Use: No   OB History    Gravida Para Term Preterm AB TAB SAB Ectopic Multiple Living   4 3 3  1 1    3      Review of Systems  All other systems reviewed and are negative.     Allergies  Review of patient's allergies indicates no known allergies.  Home Medications   Prior to Admission medications   Medication Sig Start Date End Date Taking? Authorizing Provider  albuterol (PROVENTIL HFA;VENTOLIN HFA) 108 (90 BASE) MCG/ACT inhaler Inhale 2 puffs into the lungs every 4 (four) hours as needed for wheezing or shortness of breath.  04/03/15   06/03/15, MD  azaTHIOprine (IMURAN) 50 MG tablet Take 5 tablets (250 mg total) by mouth daily. 09/27/15   Josalyn Funches, MD  celecoxib (CELEBREX) 200 MG capsule Take 1 capsule (200 mg total) by mouth 2 (two) times daily. 09/27/15   Josalyn Funches, MD  hydroxychloroquine (PLAQUENIL) 200 MG tablet Take 1 tablet (200 mg total) by mouth 2 (two) times daily. 09/27/15   Josalyn Funches, MD  levonorgestrel (LILETTA, 52 MG,) 18.6 MCG/DAY IUD IUD 1 each by Intrauterine route once. 09/15/15   Historical Provider, MD  omeprazole (PRILOSEC) 40 MG capsule Take 1 capsule (40 mg total) by mouth daily. 09/27/15   Josalyn Funches, MD  predniSONE (DELTASONE) 10 MG tablet Take 2 tablets (20 mg total) by mouth daily with breakfast. 09/27/15   11/27/15, MD  rivaroxaban (XARELTO) 20 MG TABS tablet Take 1 tablet (20 mg total) by mouth daily with supper. 09/27/15   Josalyn Funches, MD  traMADol (ULTRAM) 50 MG tablet Take 1 tablet (50 mg total) by mouth every 12 (twelve) hours as needed. 09/13/15   Josalyn Funches, MD   BP 127/77 mmHg  Pulse 76  Temp(Src) 98.5 F (36.9 C) (Oral)  Resp 18  SpO2 100%  LMP 06/27/2015 Physical Exam  Constitutional: She is oriented to person, place, and time. She appears well-developed and well-nourished. She is sleeping. She is easily aroused.  Non-toxic appearance. No distress.  HENT:  Head: Normocephalic and atraumatic.  Nose: Nose normal.  Mouth/Throat: Uvula is midline and oropharynx is clear and moist. Mucous membranes are not pale, not dry and not cyanotic.  Eyes: Conjunctivae, EOM and lids are normal. Pupils are equal, round, and reactive to light. Right eye exhibits no discharge. Left eye exhibits no discharge. No scleral icterus.  Neck: Normal range of motion. No JVD present. No tracheal deviation present. No thyromegaly present.  Cardiovascular: Regular rhythm, normal heart sounds and intact distal pulses.  Tachycardia present.  Exam reveals no gallop and no  friction rub.   No murmur heard. Pulses:      Radial pulses are 2+ on the right side, and 2+ on the left side.  Pulmonary/Chest: Tachypnea noted. No respiratory distress. She has decreased breath sounds. She has wheezes. She has rhonchi. She exhibits no tenderness.  Frequent cough, able to speak in short sentences  Abdominal: Soft. Normal appearance and bowel sounds are normal. She exhibits no distension and no mass. There is no tenderness. There is no rigidity, no rebound and no guarding.  Abdomen obese, soft, non tender, no CVA tenderness  Musculoskeletal: Normal range of motion. She exhibits no edema or tenderness.  Lymphadenopathy:    She has no cervical adenopathy.  Neurological: She is alert, oriented to person, place, and time and easily aroused. She has normal reflexes. No  cranial nerve deficit. She exhibits normal muscle tone. Coordination normal.  Skin: Skin is warm and dry. No rash noted. She is not diaphoretic. No cyanosis or erythema. No pallor. Nails show no clubbing.  Psychiatric: She has a normal mood and affect. Her behavior is normal. Judgment and thought content normal.  Nursing note and vitals reviewed.   ED Course  Procedures (including critical care time) Labs Review Labs Reviewed  COMPREHENSIVE METABOLIC PANEL - Abnormal; Notable for the following:    Glucose, Bld 122 (*)    All other components within normal limits  CBC - Abnormal; Notable for the following:    WBC 10.7 (*)    Hemoglobin 10.7 (*)    MCH 22.7 (*)    MCHC 28.1 (*)    RDW 16.8 (*)    All other components within normal limits  URINALYSIS, ROUTINE W REFLEX MICROSCOPIC (NOT AT [facility]) - Abnormal; Notable for the following:    Color, Urine AMBER (*)    APPearance CLOUDY (*)    Hgb urine dipstick SMALL (*)    Bilirubin Urine SMALL (*)    Ketones, ur 15 (*)    Protein, ur 30 (*)    All other components within normal limits  URINE MICROSCOPIC-ADD ON - Abnormal; Notable for the following:     Squamous Epithelial / LPF 0-5 (*)    Bacteria, UA RARE (*)    Crystals CA OXALATE CRYSTALS (*)    All other components within normal limits  CULTURE, BLOOD (ROUTINE X 2)  CULTURE, BLOOD (ROUTINE X 2)  LIPASE, BLOOD  I-STAT BETA HCG BLOOD, ED ([facility], [facility], [facility] ONLY)  I-STAT CG4 LACTIC ACID, ED    Imaging Review Dg Chest 2 View  10/09/2015  CLINICAL DATA:  Productive cough. Shortness of breath. Wheezing for the past 3 days. Asthma. EXAM: CHEST  2 VIEW COMPARISON:  09/07/2015. FINDINGS: A poor inspiration is again demonstrated. Interval patchy opacity in the left upper lung zone. Stable prominence of the interstitial markings and mild central peribronchial thickening. Grossly normal sized heart. Diffuse osteopenia. IMPRESSION: 1. Mild left upper lobe pneumonia. 2. Stable mild bronchitic changes and mild chronic interstitial lung disease. Electronically Signed   By: Beckie Salts M.D.   On: 10/09/2015 08:13   I have personally reviewed and evaluated these images and lab results as part of my medical decision-making.   EKG Interpretation   Date/Time:  Saturday October 09 2015 07:16:07 EDT Ventricular Rate:  92 PR Interval:  123 QRS Duration: 94 QT Interval:  371 QTC Calculation: 459 R Axis:   71 Text Interpretation:  Sinus rhythm No significant change since last  tracing Confirmed by RAY MD, Duwayne Heck [phone]) on 10/09/2015 9:53:26 AM      MDM   30 year old female presents to the ER with complaint of nausea, she had been in the ER for approximately 3-4 hours prior to the time of my evaluation. Granted. The patient was sleeping, was arousable but would not answer any questions. I told patient I would return in a few minutes and she needed to wake up and answer my questions I could help her.  I ordered repeat vitals, as he had not been done at approximately 4 hoChest x-ray significant forurs, and return shortly afterwards.  Patient continued to be sleepy but was able to answer questions. She is  Spanish-speaking and is able to tell me that she had cough, exertional shortness of breath, and nausea since Monday.  He later had to return with the  interpreter phone to get further history, meanwhile chest x-ray breathing treatments were ordered, and chart review reveals a history of interstitial lung disease, asthma, PE, admission approximately one month ago for pneumonia and asthma exacerbation.  Patient was placed on the monitor and chest x-ray is obtained.  9:43 AM  CXR significant for mild LUL PNA, pt continues to be tachypneic, dyspneic and wheezy.  Given her significant lung disease, asthma, chronic steroid use, history of PEs, patient is high risk for outpatient treatment of PNA.  She was discharged less than one month ago for similar presentation, was diagnosed with strep pneumonia, was treated inpatient with ceftriaxone and outpatient with Augmentin. Patient will need coverage for HCAP given recent admission.  At rest pt is dropping SpO2 to 88%, which is consistent with hx of OSA, when ambulated pt dropped to 87%.  10:34 AM  I spoke with Dr. Melynda Ripple, who will admit pt to obs/tele for dyspnea.  BCx2 were obtained in the ER, and Abx with HCAP coverage ordered.  Pt was placed on CAT after no improvement of dyspnea with multiple breathing tx.  Pt continued to complain of body ache, she has known chronic MSK pain secondary to connective tissue disorder, lupus, RA, and fibromyalgia, she is on tramadol chronically at home, given respiratory issues, pt was given low dose fentanyl and low dose toradol for pain.     Final diagnoses:  HCAP (healthcare-associated pneumonia)  Asthma exacerbation        Danelle Berry, PA-C 10/09/15 1049  Tomasita Crumble, MD 10/09/15 1559

## 2015-10-09 NOTE — H&P (Signed)
[facility] History and Physical  Infantof Donna Hall Donna Hall:[id number] DOB: Aug 06, 1985 DOA: 10/09/2015  Referring physician:  PCP: Lora Paula, MD   Chief Complaint: Shortness of Breath  HPI: Donna Hall Donna is a 30 y.o. female  With a history of PE on Xarelto, RA, SLE, interstitial lung disease on home O2, chronic steroid use recently increased depression, anxiety, chronic pain syndrome, morbid obesity, OSA, presenting with progressive shortness of breath over the last 2-3 days. She was recently hospitalized in January 2017 with acute respiratory failure with hypoxia in the setting of pneumonia, and anoter hospitalization on March 12 for CAP at which time she was sent home with antibiotics with Augmentin and home O2. She admits to not using 24 hr O2, only at home. Symptoms accompanied by yellowish sputum. Denies rhinorrhea. Denies any hemoptysis. Reports subjective fevers, chills,myalgias, night sweats.Denies any chest pain, chest wall pain or palpitations. He denies any abdominal pain. Of note, she had a recent abortion of a 14 wk fetus as of 4/10 in [place], per report, no complications during the procedure She reports some nausea without dizziness or vertigo. Denies lower extremity swelling. No confusion was reported. He denies any vision changes or double vision. He denies any headaches.Reports sick contact at [facility] about 2 weeks ago. Denies  recent long distant travels.  At the ED, she was desating to 88% in RA, 87 with ambulation, controlled at 100 percent with O2. CXR revealed LUL  Pneumonia. Cultures were obtained. She received Vanco Cefepime x1 IVShe was placed on NRB with respiratory treatments without significant improvement. She also received Fentanyl and Toradol for pain with some control of symptoms. CBC and CMET remarkable for mild anemia and mild leukocytosis. Lactic Acid 1.24. She is being admitted for management of symptoms.   ROS:  See HPI for significant  positives. All other systems were reviewed and are negative.  Past Medical History  Diagnosis Date  . Psoriasis 2010    as a child  . Chronic pain disorder   . Obesity   . Chronic steroid use 2010  . Lupus (HCC) 2000  . Lupus (systemic lupus erythematosus) (HCC) 2010  . Rheumatoid arthritis(714.0) 2010  . Hypertension 2010  . Pneumonia "several times"  . Interstitial lung disease (HCC)     [name] 05/15/2014  . On home oxygen therapy     "2L; 24/7" (05/15/2014)  . Pulmonary embolism (HCC)     hx/notes 05/15/2014  . History of blood transfusion     "because white cells ate the red cells"  . Headache     "just when I have the pain from aching bones and psorasis and/or lupus symptoms" (05/15/2104)  . 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 Asthma   . Gestational diabetes 2006  . Depression   . Sleep apnea    Past Surgical History  Procedure Laterality Date  . Thigh / knee soft tissue biopsy Right 2011    thigh  . Cesarean section  2010   Social History:  reports that she has never smoked. She has never used smokeless tobacco. She reports that she does not drink alcohol or use illicit drugs.  No Known Allergies  Family History  Problem Relation Age of Onset  . Diabetes Mother   . Hypertension Mother   . Cancer Neg Hx   . Early death Neg Hx   . Heart disease Neg Hx  Prior to Admission medications   Medication Sig Start Date End Date Taking? Authorizing Provider  albuterol (PROVENTIL HFA;VENTOLIN HFA) 108 (90 BASE) MCG/ACT inhaler Inhale 2 puffs into the lungs every 4 (four) hours as needed for wheezing or shortness of breath. 04/03/15   Osvaldo Shipper, MD  azaTHIOprine (IMURAN) 50 MG tablet Take 5 tablets (250 mg total) by mouth daily. 09/27/15   Josalyn Funches, MD  celecoxib (CELEBREX) 200 MG capsule Take 1 capsule (200 mg total) by mouth 2 (two) times daily. 09/27/15   Josalyn Funches, MD    hydroxychloroquine (PLAQUENIL) 200 MG tablet Take 1 tablet (200 mg total) by mouth 2 (two) times daily. 09/27/15   Josalyn Funches, MD  levonorgestrel (LILETTA, 52 MG,) 18.6 MCG/DAY IUD IUD 1 each by Intrauterine route once. 09/15/15   Historical Provider, MD  omeprazole (PRILOSEC) 40 MG capsule Take 1 capsule (40 mg total) by mouth daily. 09/27/15   Josalyn Funches, MD  predniSONE (DELTASONE) 10 MG tablet Take 2 tablets (20 mg total) by mouth daily with breakfast. 09/27/15   Dessa Phi, MD  rivaroxaban (XARELTO) 20 MG TABS tablet Take 1 tablet (20 mg total) by mouth daily with supper. 09/27/15   Josalyn Funches, MD  traMADol (ULTRAM) 50 MG tablet Take 1 tablet (50 mg total) by mouth every 12 (twelve) hours as needed. 09/13/15   Dessa Phi, MD   Physical Exam: Filed Vitals:   10/09/15 1100 10/09/15 1115 10/09/15 1130 10/09/15 1145  BP: 137/84 100/82 120/74 124/76  Pulse: 91 102 112 115  Temp:      TempSrc:      Resp: 19 20 21 17   SpO2: 100% 100% 100% 100%    Wt Readings from Last 3 Encounters:  09/27/15 127.007 kg (280 lb)  09/15/15 125.737 kg (277 lb 3.2 oz)  09/13/15 123.378 kg (272 lb)    General: Appears anxious and uncomfortable, In moderate degree of respiratory distress. Cushingoid appearance. Morbidly obese Eyes:  PERRL, EOMI, normal lids, iris ENT: grossly normal hearing, lips & tongue Neck: no lymphadenopathy, masses or thyromegaly Cardiovascular: regular rate and rythm, no murmurs, rubs or gallops. No lower extremity edema   Respiratory:  no wheezing, rhonhci ; diffuse rales bilaterally. Increased work of breathing.  Abdomen: obese,non-tender, normal bowel sounds Skin: no rash or induration seen on limited exam. No open lesions. Musculoskeletal:  grossly normal tone in both upper and lower extremities Psychiatric:anxious mood and affect, speech fluent and appropriate Neurologic: CN 2-12 grossly intact, moves all extremities in coordinated fashion.          Labs on  Admission:  Basic Metabolic Panel:  Recent Labs Lab 10/09/15 0309  NA 142  K 3.8  CL 107  CO2 25  GLUCOSE 122*  BUN 20  CREATININE 0.61  CALCIUM 9.0    Liver Function Tests:  Recent Labs Lab 10/09/15 0309  AST 30  ALT 22  ALKPHOS 61  BILITOT 0.5  PROT 6.6  ALBUMIN 3.8    Recent Labs Lab 10/09/15 0309  LIPASE 47   No results for input(s): AMMONIA in the last 168 hours.  CBC:  Recent Labs Lab 10/09/15 0309  WBC 10.7*  HGB 10.7*  HCT 38.1  MCV 80.7  PLT 345    Cardiac Enzymes: No results for input(s): CKTOTAL, CKMB, CKMBINDEX, TROPONINI in the last 168 hours.  BNP (last 3 results)  Recent Labs  11/19/14 1349  BNP 8.7    ProBNP (last 3 results) No results for input(s): PROBNP in the  last 8760 hours.   CREATININE: 0.61 (10/09/15 0309) Estimated creatinine clearance - 133.4 mL/min  CBG: No results for input(s): GLUCAP in the last 168 hours.  Radiological Exams on Admission: Dg Chest 2 View  10/09/2015  CLINICAL DATA:  Productive cough. Shortness of breath. Wheezing for the past 3 days. Asthma. EXAM: CHEST  2 VIEW COMPARISON:  09/07/2015. FINDINGS: A poor inspiration is again demonstrated. Interval patchy opacity in the left upper lung zone. Stable prominence of the interstitial markings and mild central peribronchial thickening. Grossly normal sized heart. Diffuse osteopenia. IMPRESSION: 1. Mild left upper lobe pneumonia. 2. Stable mild bronchitic changes and mild chronic interstitial lung disease. Electronically Signed   By: Beckie Salts M.D.   On: 10/09/2015 08:13    EKG: Independently reviewed.    Assessment/Plan Principal Problem:   HCAP (healthcare-associated pneumonia) Active Problems:   INTERSTITIAL LUNG DISEASE   Rheumatoid arthritis (HCC)   Obesity, Class III, BMI 40-49.9 (morbid obesity) (HCC)   Essential hypertension, benign   Anxiety and depression   Moderate obstructive sleep apnea   Chronic pain syndrome   Chronic  anticoagulation   Disseminated lupus erythematosus (HCC)   History of pulmonary embolism   Cough   Chronic respiratory failure with hypoxia (HCC)   Dyspnea   Acute on chronic respiratory failure with hypoxia (HCC)  Acute respiratory failure with hypoxia likely related to new mild LUL pneumonia per CXR.She was recently admitted for CAP in March 2017. Osats are 100 in NRB (was desating in RA and on ambulation)  Lactic acid. ABGs ordered -Admit to step down -Await blood cultures -Continued oxygen supplementation and wean as able -Antibiotics per protocol with Cefepime and Vanco -Strep pneumo urine antigen -Legionella urine antigen -Nebulizers every 6 hours, q 4 prn Continue steroids at 40 mg po qd Influenza panel ABG Respiratory consult  History of PE on Xarelto Admitted w SOB and acute respiratory failure with O2 sats in the 80's on RA.  EKG normal.  Continue Xarelto DDmer CT angio in view of recent surgical procedure on 4/10, OSA, PNA, levogenestrel ring among other comorbidities  Rheumathoid Arthritis SLE  On Imuran, and on chronic prednisone Analgesics as needed  Morbid Obesity Nutrition Consult for lifestyle modification   Hypertension Continue home anti-hypertensive medications.  Add Hydralazine Q6 hours as needed for SBP >160 and /or DBP >110.   Anxiety and depression Continue meds  Deconditioning  PT/OT   Code Status: Full Code   DVT Prophylaxis:   Xarelto  Family Communication: No Family at bedside Disposition Plan: Pending Improvement. Admitted for stepdown    [facility] E,PA-C [facility] [email or web address] Password [email or web address]

## 2015-10-09 NOTE — Progress Notes (Addendum)
Report received from [place] ,[place] for admission to [phone]. Nehemiah Settle stated she will clarify with MD if patient need step down r/t her being on a continuous neb and that is not done on med/surg unit.   11:16 AM Nehemiah Settle, RN stated patient will be admitted to Step Down Unit

## 2015-10-09 NOTE — Progress Notes (Signed)
Pharmacy Antibiotic Note  Donna Hall is a 30 y.o. female admitted on 10/09/2015 with pneumonia.  Pharmacy has been consulted for vancomycin dosing. Pt reported generalized body aches and nausea but denis fever. CXR shows mid left upper lobe PNA. Afebrile, WBC 10.7. SCr is stable, normalized CrCl ~69ml/min. Cefepime 2g x 1 already ordered in the ED.  Plan: Give vancomycin 2,500mg  IV x 1, then start vancomycin 1g IV Q8 Continue cefepime 2g IV Q8 Monitor clinical picture, renal function, VT at Css F/U C&S, abx deescalation / LOT      Temp (24hrs), Avg:98.5 F (36.9 C), Min:98.4 F (36.9 C), Max:98.5 F (36.9 C)   Recent Labs Lab 10/09/15 0309 10/09/15 0322  WBC 10.7*  --   CREATININE 0.61  --   LATICACIDVEN  --  1.24    Estimated Creatinine Clearance: 133.4 mL/min (by C-G formula based on Cr of 0.61).    No Known Allergies  Antimicrobials this admission: Cefepime 4/15 >>  Vancomycin 4/15 >>   Dose adjustments this admission: n/a  Microbiology results: 4/15 BCx: sent  Thank you for allowing pharmacy to be a part of this patient's care.  Enzo Bi, PharmD, BCPS Clinical Pharmacist Pager [phone] 10/09/2015 10:05 AM

## 2015-10-09 NOTE — ED Notes (Addendum)
Pt reports generalized body aches and nausea but denies vomiting/fever/diarrhea and denies abdominal pain. She reports symptoms started yesterday.

## 2015-10-10 ENCOUNTER — Inpatient Hospital Stay (HOSPITAL_COMMUNITY): Payer: Medicaid Other

## 2015-10-10 DIAGNOSIS — G4733 Obstructive sleep apnea (adult) (pediatric): Secondary | ICD-10-CM

## 2015-10-10 DIAGNOSIS — M329 Systemic lupus erythematosus, unspecified: Secondary | ICD-10-CM

## 2015-10-10 DIAGNOSIS — G894 Chronic pain syndrome: Secondary | ICD-10-CM

## 2015-10-10 LAB — CBC
HEMATOCRIT: 35.3 % — AB (ref 36.0–46.0)
HEMOGLOBIN: 9.5 g/dL — AB (ref 12.0–15.0)
MCH: 22.2 pg — ABNORMAL LOW (ref 26.0–34.0)
MCHC: 26.9 g/dL — ABNORMAL LOW (ref 30.0–36.0)
MCV: 82.5 fL (ref 78.0–100.0)
Platelets: 293 10*3/uL (ref 150–400)
RBC: 4.28 MIL/uL (ref 3.87–5.11)
RDW: 17.2 % — ABNORMAL HIGH (ref 11.5–15.5)
WBC: 6.9 10*3/uL (ref 4.0–10.5)

## 2015-10-10 LAB — COMPREHENSIVE METABOLIC PANEL
ALBUMIN: 3 g/dL — AB (ref 3.5–5.0)
ALK PHOS: 41 U/L (ref 38–126)
ALT: 15 U/L (ref 14–54)
ANION GAP: 7 (ref 5–15)
AST: 21 U/L (ref 15–41)
BUN: 15 mg/dL (ref 6–20)
CALCIUM: 8.6 mg/dL — AB (ref 8.9–10.3)
CO2: 29 mmol/L (ref 22–32)
Chloride: 106 mmol/L (ref 101–111)
Creatinine, Ser: 0.42 mg/dL — ABNORMAL LOW (ref 0.44–1.00)
GFR calc non Af Amer: >60 mL/min (ref >60)
GLUCOSE: 109 mg/dL — AB (ref 65–99)
POTASSIUM: 4 mmol/L (ref 3.5–5.1)
SODIUM: 142 mmol/L (ref 135–145)
TOTAL PROTEIN: 5.8 g/dL — AB (ref 6.5–8.1)
Total Bilirubin: 0.5 mg/dL (ref 0.3–1.2)

## 2015-10-10 LAB — URINE CULTURE

## 2015-10-10 MED ORDER — ALBUTEROL SULFATE (2.5 MG/3ML) 0.083% IN NEBU: 2.5000 mg | INHALATION_SOLUTION | RESPIRATORY_TRACT | Status: DC | PRN

## 2015-10-10 MED ORDER — CETYLPYRIDINIUM CHLORIDE 0.05 % MT LIQD
7.0000 mL | Freq: Two times a day (BID) | OROMUCOSAL | Status: DC
  Administered 2015-10-10 – 2015-10-12 (×5): 7 mL via OROMUCOSAL

## 2015-10-10 MED ORDER — IPRATROPIUM-ALBUTEROL 0.5-2.5 (3) MG/3ML IN SOLN
3.0000 mL | Freq: Three times a day (TID) | RESPIRATORY_TRACT | Status: DC
  Administered 2015-10-11: 3 mL via RESPIRATORY_TRACT
  Filled 2015-10-10: qty 3

## 2015-10-10 NOTE — Evaluation (Signed)
Physical Therapy Evaluation and Discharge Patient Details Name: Donna Hall MRN: [id number] DOB: [birth year] Today's Date: 10/10/2015   History of Present Illness  30 y.o. female with a Past Medical History of of PE on Xarelto, RA, SLE, interstitial lung disease on home O2, chronic steroid use recently increased depression, anxiety, chronic pain syndrome, morbid obesity, OSA, presenting with progressive shortness of breath over the last 2-3 days. She was recently hospitalized in January 2017 with acute respiratory failure with hypoxia in the setting of pneumonia  Clinical Impression  Patient seen for mobility evaluation, tolerated well. Mobilized without device on 2 liters [place]. No physical assist required. Anticipate patient will be safe for d/c home when medically appropriate, no further acute PT needs. Will sign off.    Follow Up Recommendations No PT follow up    Equipment Recommendations  None recommended by PT    Recommendations for Other Services       Precautions / Restrictions Precautions Precaution Comments: watch O2      Mobility  Bed Mobility Overal bed mobility: Modified Independent             General bed mobility comments: use of bed rails to come to EOB  Transfers Overall transfer level: Independent Equipment used: None                Ambulation/Gait Ambulation/Gait assistance: Independent Ambulation Distance (Feet): 360 Feet Assistive device: None Gait Pattern/deviations: Drifts right/left;Wide base of support;Antalgic (increased lateral sway noted) Gait velocity: decreased Gait velocity interpretation: Below normal speed for age/gender General Gait Details: steady with gait despite gait deviations  Stairs            Wheelchair Mobility    Modified Rankin (Stroke Patients Only)       Balance Overall balance assessment: No apparent balance deficits (not formally assessed)                                            Pertinent Vitals/Pain Pain Assessment: Faces Faces Pain Scale: Hurts little more Pain Location: right knee Pain Intervention(s): Monitored during session    Home Living Family/patient expects to be discharged to:: Private residence Living Arrangements: Spouse/significant other;Children Available Help at Discharge: Family;Other (Comment) Type of Home: Apartment Home Access: Level entry     Home Layout: One level Home Equipment: Walker - 2 wheels      Prior Function Level of Independence: Independent               Hand Dominance        Extremity/Trunk Assessment   Upper Extremity Assessment: Overall WFL for tasks assessed           Lower Extremity Assessment: Overall WFL for tasks assessed (BLE edema noted)         Communication   Communication: Prefers language other than [place];Interpreter utilized;Other (comment)  Cognition Arousal/Alertness: Awake/alert Behavior During Therapy: WFL for tasks assessed/performed Overall Cognitive Status: Within Functional Limits for tasks assessed                      General Comments      Exercises        Assessment/Plan    PT Assessment Patent does not need any further PT services  PT Diagnosis Difficulty walking   PT Problem List    PT Treatment Interventions     PT  Goals (Current goals can be found in the Care Plan section) Acute Rehab PT Goals PT Goal Formulation: All assessment and education complete, DC therapy    Frequency     Barriers to discharge        Co-evaluation               End of Session Equipment Utilized During Treatment: Oxygen Activity Tolerance: Patient tolerated treatment well Patient left: in bed;with call bell/phone within reach (with resp therapist in room) Nurse Communication: Mobility status         Time: 4696-2952 PT Time Calculation (min) (ACUTE ONLY): 16 min .  Charges:   PT Evaluation $PT Eval Low Complexity: 1 Procedure     PT G  CodesFabio Asa 11-09-15, 9:02 AM Charlotte Crumb, PT DPT  [phone]

## 2015-10-10 NOTE — Progress Notes (Signed)
PROGRESS NOTE  Donna Hall [id number]:[id number] DOB: [birth year] DOA: 10/09/2015 PCP: Lora Paula, MD  HPI/Recap of past 24 hours:  Patient does not speak english, RN helped translate, patient reported feeling better, now back to baseline o2 2liter, denies sob, no cough, no fever, no chest pain  Assessment/Plan: Principal Problem:   HCAP (healthcare-associated pneumonia) Active Problems:   INTERSTITIAL LUNG DISEASE   Rheumatoid arthritis (HCC)   Obesity, Class III, BMI 40-49.9 (morbid obesity) (HCC)   Essential hypertension, benign   Anxiety and depression   Moderate obstructive sleep apnea   Chronic pain syndrome   Chronic anticoagulation   Disseminated lupus erythematosus (HCC)   History of pulmonary embolism   Cough   Chronic respiratory failure with hypoxia (HCC)   Dyspnea   Acute on chronic respiratory failure with hypoxia (HCC)  Acute on chronic hypoxic respiratory failure, initially on presentation with tachypnea RR 27, wheezing, sinus tachycardia 115, hypoxia requiring NRB, CTA chest no PE, does has bronchial wall thickening, scatter bilateral ground-glass opacities, she was treated with vanc/cefepime since admission, with rapid clinical improvement. Flu pcr negative, urine strep pneumo antigen negative, mrsa screen negative, Now back to baseline o2 requirement,  d/c vanc, continue cefepime , awaiting final culture result.   History of PE on Xarelto   CTA on admission, no PE, Continue Xarelto   Rheumathoid Arthritis SLE On Imuran, and on chronic prednisone Analgesics as needed  Morbid Obesity Nutrition Consult for lifestyle modification   Hypertension Continue home anti-hypertensive medications.  Add Hydralazine Q6 hours as needed for SBP >160 and /or DBP >110.   Anxiety and depression Continue meds  Deconditioning  PT/OT   Code Status: full  Family Communication: patient  And multiple family members in room  Disposition Plan: home in  1-2 days, pending final culture result   Consultants:  none  Procedures:  none  Antibiotics:  vanc x2 doses  Cefepime from admission   Objective: BP 120/74 mmHg  Pulse 68  Temp(Src) 97.8 F (36.6 C) (Oral)  Resp 18  Ht 5\' 5"  (1.651 m)  Wt 144.4 kg (318 lb 5.5 oz)  BMI 52.98 kg/m2  SpO2 97%  LMP 06/27/2015  Intake/Output Summary (Last 24 hours) at 10/10/15 0818 Last data filed at 10/10/15 0733  Gross per 24 hour  Intake   1360 ml  Output    500 ml  Net    860 ml   Filed Weights   10/09/15 1611  Weight: 144.4 kg (318 lb 5.5 oz)    Exam:   General:  Obese, NAD  Cardiovascular: RRR  Respiratory: CTABL  Abdomen: Soft/ND/NT, positive BS  Musculoskeletal: No Edema  Neuro: aaox3  Data Reviewed: Basic Metabolic Panel:  Recent Labs Lab 10/09/15 0309 10/10/15 0449  NA 142 142  K 3.8 4.0  CL 107 106  CO2 25 29  GLUCOSE 122* 109*  BUN 20 15  CREATININE 0.61 0.42*  CALCIUM 9.0 8.6*   Liver Function Tests:  Recent Labs Lab 10/09/15 0309 10/10/15 0449  AST 30 21  ALT 22 15  ALKPHOS 61 41  BILITOT 0.5 0.5  PROT 6.6 5.8*  ALBUMIN 3.8 3.0*    Recent Labs Lab 10/09/15 0309  LIPASE 47   No results for input(s): AMMONIA in the last 168 hours. CBC:  Recent Labs Lab 10/09/15 0309 10/10/15 0449  WBC 10.7* 6.9  HGB 10.7* 9.5*  HCT 38.1 35.3*  MCV 80.7 82.5  PLT 345 293   Cardiac Enzymes:  No results for input(s): CKTOTAL, CKMB, CKMBINDEX, TROPONINI in the last 168 hours. BNP (last 3 results)  Recent Labs  11/19/14 1349  BNP 8.7    ProBNP (last 3 results) No results for input(s): PROBNP in the last 8760 hours.  CBG: No results for input(s): GLUCAP in the last 168 hours.  Recent Results (from the past 240 hour(s))  MRSA PCR Screening     Status: None   Collection Time: 10/09/15  5:18 PM  Result Value Ref Range Status   MRSA by PCR NEGATIVE NEGATIVE Final    Comment:        The GeneXpert MRSA Assay (FDA approved for  NASAL specimens only), is one component of a comprehensive MRSA colonization surveillance program. It is not intended to diagnose MRSA infection nor to guide or monitor treatment for MRSA infections.      Studies: Ct Angio Chest Pe W/cm &/or Wo Cm  10/09/2015  CLINICAL DATA:  30 year old female with a history of lupus, rheumatoid arthritis, interstitial lung disease, asthma and acute shortness of breath. Evaluate for pulmonary embolus. EXAM: CT ANGIOGRAPHY CHEST WITH CONTRAST TECHNIQUE: Multidetector CT imaging of the chest was performed using the standard protocol during bolus administration of intravenous contrast. Multiplanar CT image reconstructions and MIPs were obtained to evaluate the vascular anatomy. CONTRAST:  100 mL Isovue 370 administered intravenously COMPARISON:  Prior chest x-ray 10/09/2015; prior CT scan of the chest 11/19/2014 FINDINGS: Mediastinum: 1.6 cm left thyroid nodule. Otherwise, the thoracic inlet is unremarkable. No mediastinal mass or adenopathy. Unremarkable thoracic esophagus. Heart/Vascular: Adequate opacification of the pulmonary arteries to the proximal segmental level. Evaluation beyond this is limited by respiratory motion artifact. No evidence of central filling defect to suggest acute pulmonary embolus. The main pulmonary artery is enlarged at 3.7 cm. Conventional 3 vessel aortic arch anatomy. No evidence of aneurysm. Mild cardiomegaly. No pericardial effusion. Lungs/Pleura: Moderate respiratory motion artifact. Scattered areas of geographic ground-glass attenuation airspace opacity are nonspecific in appearance but similar compared to prior imaging. Mild bronchial wall thickening. Elevation of the right hemidiaphragm. No pneumothorax or pleural effusion. Bones/Soft Tissues: No acute fracture or aggressive appearing lytic or blastic osseous lesion. Upper Abdomen: Cholelithiasis. Otherwise, the visualized upper abdomen is unremarkable. Review of the MIP images  confirms the above findings. IMPRESSION: 1. Negative for acute pulmonary embolus to the proximal segmental level. Evaluation of the more distal branches is limited by respiratory motion artifact. 2. Enlarged main pulmonary artery as can be seen in the setting of pulmonary arterial hypertension. 3. Scattered bilateral geographic regions of ground-glass attenuation airspace opacity which are nonspecific in appearance but similar compared to prior imaging. Differential considerations include areas of atelectasis an adjacent air trapping secondary to small airways disease, active alveolitis, atypical respiratory infection, and less likely patchy pulmonary edema. 4. Mild cardiomegaly. 5. Incidental note is made of a 1.6 cm left thyroid nodule. Consider further evaluation on a nonemergent basis with a dedicated thyroid ultrasound. 6. Diffuse mild bronchial wall thickening. 7. Chronic elevation of the right hemidiaphragm. 8. Cholelithiasis. 9. Electronically Signed   By: Malachy Moan M.D.   On: 10/09/2015 14:11    Scheduled Meds: . antiseptic oral rinse  7 mL Mouth Rinse BID  . azaTHIOprine  250 mg Oral Daily  . ceFEPime (MAXIPIME) IV  2 g Intravenous 3 times per day  . celecoxib  200 mg Oral BID  . fentaNYL (SUBLIMAZE) injection  50 mcg Intravenous Once  . hydroxychloroquine  200 mg Oral BID  .  ipratropium-albuterol  3 mL Nebulization Q6H  . pantoprazole  40 mg Oral Daily  . predniSONE  40 mg Oral Q breakfast  . rivaroxaban  20 mg Oral Q supper  . vancomycin  1,000 mg Intravenous Q8H    Continuous Infusions:    Time spent:  Fishel Wamble MD, PhD  [facility] Pager [phone]. If 7PM-7AM, please contact night-coverage at [email or web address], password [facility] 10/10/2015, 8:18 AM  LOS: 1 day

## 2015-10-11 DIAGNOSIS — J45901 Unspecified asthma with (acute) exacerbation: Secondary | ICD-10-CM

## 2015-10-11 DIAGNOSIS — J189 Pneumonia, unspecified organism: Principal | ICD-10-CM

## 2015-10-11 LAB — BASIC METABOLIC PANEL
ANION GAP: 8 (ref 5–15)
BUN: 14 mg/dL (ref 6–20)
CHLORIDE: 102 mmol/L (ref 101–111)
CO2: 32 mmol/L (ref 22–32)
CREATININE: 0.56 mg/dL (ref 0.44–1.00)
Calcium: 8.4 mg/dL — ABNORMAL LOW (ref 8.9–10.3)
GFR calc non Af Amer: >60 mL/min (ref >60)
Glucose, Bld: 89 mg/dL (ref 65–99)
Potassium: 3.9 mmol/L (ref 3.5–5.1)
SODIUM: 142 mmol/L (ref 135–145)

## 2015-10-11 LAB — CBC
HEMATOCRIT: 38.4 % (ref 36.0–46.0)
HEMOGLOBIN: 10.1 g/dL — AB (ref 12.0–15.0)
MCH: 22 pg — ABNORMAL LOW (ref 26.0–34.0)
MCHC: 26.3 g/dL — ABNORMAL LOW (ref 30.0–36.0)
MCV: 83.7 fL (ref 78.0–100.0)
Platelets: 302 10*3/uL (ref 150–400)
RBC: 4.59 MIL/uL (ref 3.87–5.11)
RDW: 16.9 % — ABNORMAL HIGH (ref 11.5–15.5)
WBC: 7.1 10*3/uL (ref 4.0–10.5)

## 2015-10-11 LAB — GLUCOSE, CAPILLARY: Glucose-Capillary: 116 mg/dL — ABNORMAL HIGH (ref 65–99)

## 2015-10-11 LAB — LEGIONELLA PNEUMOPHILA SEROGP 1 UR AG: L. PNEUMOPHILA SEROGP 1 UR AG: NEGATIVE

## 2015-10-11 LAB — LEGIONELLA PNEUMOPHILA TOTAL AB: Legionella Pneumo Total Ab: <0.91 OD ratio (ref 0.00–0.90)

## 2015-10-11 LAB — MAGNESIUM: MAGNESIUM: 1.9 mg/dL (ref 1.7–2.4)

## 2015-10-11 MED ORDER — IPRATROPIUM-ALBUTEROL 0.5-2.5 (3) MG/3ML IN SOLN
3.0000 mL | Freq: Two times a day (BID) | RESPIRATORY_TRACT | Status: DC
  Administered 2015-10-11 – 2015-10-12 (×2): 3 mL via RESPIRATORY_TRACT
  Filled 2015-10-11 (×2): qty 3

## 2015-10-11 MED ORDER — LEVOFLOXACIN 750 MG PO TABS
750.0000 mg | ORAL_TABLET | Freq: Every evening | ORAL | Status: DC
  Administered 2015-10-11: 750 mg via ORAL
  Filled 2015-10-11: qty 1

## 2015-10-11 NOTE — Progress Notes (Signed)
Pharmacy Antibiotic Note  Donna Hall is a 30 y.o. female admitted on 10/09/2015 with pneumonia.  Pharmacy has been consulted for Levaquin dosing. Today is day 3 antibiotics, was on cefepime with last dose ~12:15 today.   Plan: Levaquin 750 mg PO daily Recommend 5 more days to total 7 days of therapy Pharmacy signing off  Height: 5\' 5"  (165.1 cm) Weight: (!) 318 lb 5.5 oz (144.4 kg) IBW/kg (Calculated) : 57  Temp (24hrs), Avg:98.5 F (36.9 C), Min:97.8 F (36.6 C), Max:98.9 F (37.2 C)   Recent Labs Lab 10/09/15 0309 10/09/15 0322 10/10/15 0449 10/11/15 0502  WBC 10.7*  --  6.9 7.1  CREATININE 0.61  --  0.42* 0.56  LATICACIDVEN  --  1.24  --   --     Estimated Creatinine Clearance: 149.3 mL/min (by C-G formula based on Cr of 0.56).    No Known Allergies   Thank you for allowing pharmacy to be a part of this patient's care.  Greer, Kathrynchester.D., BCPS Clinical Pharmacist Pager: [phone] 10/11/2015 12:52 PM

## 2015-10-11 NOTE — Care Management Note (Signed)
Case Management Note  Patient Details  Name: Annabelle Rexroad MRN: [id number] Date of Birth: 1986-02-20  Subjective/Objective:                 Patient admitted from home, history of SLE, RA on chronic steroids admitted with PNA recently dc'd. Followed with [facility]. Pt receiving IV Abx   Action/Plan:  Will DC to home when medically stable. CM will continue to follow for [facility] needs.  Expected Discharge Date:                  Expected Discharge Plan:  Home w Home Health Services  In-House Referral:     Discharge planning Services  CM Consult  Post Acute Care Choice:    Choice offered to:     DME Arranged:    DME Agency:     HH Arranged:    HH Agency:     Status of Service:  In process, will continue to follow  Medicare Important Message Given:    Date Medicare IM Given:    Medicare IM give by:    Date Additional Medicare IM Given:    Additional Medicare Important Message give by:     If discussed at Long Length of Stay Meetings, dates discussed:    Additional Comments:  Lawerance Sabal, RN 10/11/2015, 4:30 PM

## 2015-10-11 NOTE — Progress Notes (Signed)
PROGRESS NOTE  Donna Hall PRX:[id number] DOB: 1986-03-09 DOA: 10/09/2015 PCP: Lora Paula, MD   Assessment/Plan: Principal Problem:   HCAP (healthcare-associated pneumonia) Active Problems:   INTERSTITIAL LUNG DISEASE   Rheumatoid arthritis (HCC)   Obesity, Class III, BMI 40-49.9 (morbid obesity) (HCC)   Essential hypertension, benign   Anxiety and depression   Moderate obstructive sleep apnea   Chronic pain syndrome   Chronic anticoagulation   Disseminated lupus erythematosus (HCC)   History of pulmonary embolism   Cough   Chronic respiratory failure with hypoxia (HCC)   Dyspnea   Acute on chronic respiratory failure with hypoxia (HCC)   Brief summary 30 y.o. female with a Past Medical History of of PE on Xarelto, RA, SLE, interstitial lung disease on home O2, chronic steroid use recently increased depression, anxiety, chronic pain syndrome, morbid obesity, OSA, presenting with progressive shortness of breath over the last 2-3 days. She was recently hospitalized in January 2017 with acute respiratory failure with hypoxia in the setting of pneumonia   Assessment and plan Acute on chronic hypoxic respiratory failure,community-acquired pneumonia  initially on presentation with tachypnea RR 27, wheezing, sinus tachycardia 115, hypoxia requiring NRB, CTA chest no PE, does has bronchial wall thickening, scatter bilateral ground-glass opacities consistent with pneumonia, she was treated with vanc/cefepime since admission, with rapid clinical improvement. Flu pcr negative, urine strep pneumo antigen negative, mrsa screen negative, Now back to baseline o2 requirement,  d/c vanc, continue cefepime , awaiting final culture result.   History of PE on Xarelto   CTA on admission, no PE, Continue Xarelto   Rheumathoid Arthritis SLE On Imuran, and on chronic prednisone Analgesics as needed  Morbid Obesity Nutrition Consult for lifestyle  modification   Hypertension Continue home anti-hypertensive medications.  Add Hydralazine Q6 hours as needed for SBP >160 and /or DBP >110.   Anxiety and depression Continue meds  Deconditioning  PT/OT   Code Status: full  Family Communication: patient  And multiple family members in room  Disposition Plan:anticipate discharge home tomorrow   Consultants:  none  Procedures:  none  Antibiotics:  vanc x2 doses  Cefepime from admission   Objective: BP 99/47 mmHg  Pulse 66  Temp(Src) 97.8 F (36.6 C) (Oral)  Resp 20  Ht 5\' 5"  (1.651 m)  Wt 144.4 kg (318 lb 5.5 oz)  BMI 52.98 kg/m2  SpO2 93%  LMP 06/27/2015  Intake/Output Summary (Last 24 hours) at 10/11/15 1223 Last data filed at 10/11/15 1011  Gross per 24 hour  Intake    360 ml  Output      0 ml  Net    360 ml   Filed Weights   10/09/15 1611  Weight: 144.4 kg (318 lb 5.5 oz)    Exam:   General:  Obese, NAD  Cardiovascular: RRR  Respiratory: CTABL  Abdomen: Soft/ND/NT, positive BS  Musculoskeletal: No Edema  Neuro: aaox3  Data Reviewed: Basic Metabolic Panel:  Recent Labs Lab 10/09/15 0309 10/10/15 0449 10/11/15 0502  NA 142 142 142  K 3.8 4.0 3.9  CL 107 106 102  CO2 25 29 32  GLUCOSE 122* 109* 89  BUN 20 15 14   CREATININE 0.61 0.42* 0.56  CALCIUM 9.0 8.6* 8.4*  MG  --   --  1.9   Liver Function Tests:  Recent Labs Lab 10/09/15 0309 10/10/15 0449  AST 30 21  ALT 22 15  ALKPHOS 61 41  BILITOT 0.5 0.5  PROT 6.6 5.8*  ALBUMIN  3.8 3.0*    Recent Labs Lab 10/09/15 0309  LIPASE 47   No results for input(s): AMMONIA in the last 168 hours. CBC:  Recent Labs Lab 10/09/15 0309 10/10/15 0449 10/11/15 0502  WBC 10.7* 6.9 7.1  HGB 10.7* 9.5* 10.1*  HCT 38.1 35.3* 38.4  MCV 80.7 82.5 83.7  PLT 345 293 302   Cardiac Enzymes:   No results for input(s): CKTOTAL, CKMB, CKMBINDEX, TROPONINI in the last 168 hours. BNP (last 3 results)  Recent Labs   11/19/14 1349  BNP 8.7    ProBNP (last 3 results) No results for input(s): PROBNP in the last 8760 hours.  CBG: No results for input(s): GLUCAP in the last 168 hours.  Recent Results (from the past 240 hour(s))  Urine culture     Status: None   Collection Time: 10/09/15  3:08 AM  Result Value Ref Range Status   Specimen Description URINE, CLEAN CATCH  Final   Special Requests Immunocompromised  Final   Culture MULTIPLE SPECIES PRESENT, SUGGEST RECOLLECTION  Final   Report Status 10/10/2015 FINAL  Final  Culture, blood (Routine X 2) w Reflex to ID Panel     Status: None (Preliminary result)   Collection Time: 10/09/15  9:37 AM  Result Value Ref Range Status   Specimen Description BLOOD LEFT ANTECUBITAL  Final   Special Requests BOTTLES DRAWN AEROBIC AND ANAEROBIC 5CC  Final   Culture NO GROWTH 1 DAY  Final   Report Status PENDING  Incomplete  Culture, blood (Routine X 2) w Reflex to ID Panel     Status: None (Preliminary result)   Collection Time: 10/09/15 11:07 AM  Result Value Ref Range Status   Specimen Description BLOOD LEFT FOREARM  Final   Special Requests BOTTLES DRAWN AEROBIC AND ANAEROBIC 5CC  Final   Culture NO GROWTH 1 DAY  Final   Report Status PENDING  Incomplete  MRSA PCR Screening     Status: None   Collection Time: 10/09/15  5:18 PM  Result Value Ref Range Status   MRSA by PCR NEGATIVE NEGATIVE Final    Comment:        The GeneXpert MRSA Assay (FDA approved for NASAL specimens only), is one component of a comprehensive MRSA colonization surveillance program. It is not intended to diagnose MRSA infection nor to guide or monitor treatment for MRSA infections.   Culture, blood (routine x 2) Call MD if unable to obtain prior to antibiotics being given     Status: None (Preliminary result)   Collection Time: 10/09/15  6:23 PM  Result Value Ref Range Status   Specimen Description BLOOD RIGHT ANTECUBITAL  Final   Special Requests IN PEDIATRIC BOTTLE 3CC   Final   Culture NO GROWTH < 24 HOURS  Final   Report Status PENDING  Incomplete     Studies: No results found.  Scheduled Meds: . antiseptic oral rinse  7 mL Mouth Rinse BID  . azaTHIOprine  250 mg Oral Daily  . ceFEPime (MAXIPIME) IV  2 g Intravenous 3 times per day  . celecoxib  200 mg Oral BID  . fentaNYL (SUBLIMAZE) injection  50 mcg Intravenous Once  . hydroxychloroquine  200 mg Oral BID  . ipratropium-albuterol  3 mL Nebulization BID  . pantoprazole  40 mg Oral Daily  . predniSONE  40 mg Oral Q breakfast  . rivaroxaban  20 mg Oral Q supper    Continuous Infusions:    Time spent:  Jenasia Dolinar MD,  [facility] Pager [phone]. If 7PM-7AM, please contact night-coverage at [email or web address], password [facility] 10/11/2015, 12:23 PM  LOS: 2 days

## 2015-10-12 DIAGNOSIS — J9621 Acute and chronic respiratory failure with hypoxia: Secondary | ICD-10-CM

## 2015-10-12 DIAGNOSIS — F418 Other specified anxiety disorders: Secondary | ICD-10-CM

## 2015-10-12 LAB — RESPIRATORY VIRUS PANEL
ADENOVIRUS: NEGATIVE
INFLUENZA B 1: NEGATIVE
Influenza A: NEGATIVE
METAPNEUMOVIRUS: NEGATIVE
Parainfluenza 1: NEGATIVE
Parainfluenza 2: NEGATIVE
Parainfluenza 3: NEGATIVE
RESPIRATORY SYNCYTIAL VIRUS A: NEGATIVE
RHINOVIRUS: NEGATIVE
Respiratory Syncytial Virus B: NEGATIVE

## 2015-10-12 LAB — CBC
HEMATOCRIT: 37.9 % (ref 36.0–46.0)
HEMOGLOBIN: 10.2 g/dL — AB (ref 12.0–15.0)
MCH: 22.4 pg — AB (ref 26.0–34.0)
MCHC: 26.9 g/dL — AB (ref 30.0–36.0)
MCV: 83.1 fL (ref 78.0–100.0)
PLATELETS: 301 10*3/uL (ref 150–400)
RBC: 4.56 MIL/uL (ref 3.87–5.11)
RDW: 16.6 % — ABNORMAL HIGH (ref 11.5–15.5)
WBC: 7.3 10*3/uL (ref 4.0–10.5)

## 2015-10-12 LAB — COMPREHENSIVE METABOLIC PANEL
ALBUMIN: 3.3 g/dL — AB (ref 3.5–5.0)
ALK PHOS: 49 U/L (ref 38–126)
ALT: 17 U/L (ref 14–54)
ANION GAP: 9 (ref 5–15)
AST: 22 U/L (ref 15–41)
BUN: 10 mg/dL (ref 6–20)
CALCIUM: 8.6 mg/dL — AB (ref 8.9–10.3)
CHLORIDE: 99 mmol/L — AB (ref 101–111)
CO2: 33 mmol/L — AB (ref 22–32)
Creatinine, Ser: 0.45 mg/dL (ref 0.44–1.00)
GFR calc non Af Amer: >60 mL/min (ref >60)
GLUCOSE: 89 mg/dL (ref 65–99)
POTASSIUM: 3.8 mmol/L (ref 3.5–5.1)
SODIUM: 141 mmol/L (ref 135–145)
Total Bilirubin: 0.8 mg/dL (ref 0.3–1.2)
Total Protein: 6 g/dL — ABNORMAL LOW (ref 6.5–8.1)

## 2015-10-12 MED ORDER — LEVOFLOXACIN 750 MG PO TABS: 750.0000 mg | ORAL_TABLET | Freq: Every evening | ORAL | Status: DC

## 2015-10-12 MED ORDER — PREDNISONE 20 MG PO TABS: 40.0000 mg | ORAL_TABLET | Freq: Every day | ORAL | Status: DC

## 2015-10-12 MED ORDER — TRAMADOL HCL 50 MG PO TABS: 50.0000 mg | ORAL_TABLET | Freq: Two times a day (BID) | ORAL | Status: DC | PRN

## 2015-10-12 MED ORDER — HYDROCODONE-ACETAMINOPHEN 5-325 MG PO TABS: 1.0000 | ORAL_TABLET | Freq: Four times a day (QID) | ORAL | Status: DC | PRN

## 2015-10-12 NOTE — Care Management Note (Signed)
Case Management Note  Patient Details  Name: Katelynn Heidler MRN: [id number] Date of Birth: 06-19-1986  Subjective/Objective:                 Patient admitted from home, history of SLE, RA on chronic steroids admitted with PNA recently dc'd. Followed with [facility]. Pt receiving IV Abx  Patient on oxygen at 2L at home prior to admission.   Action/Plan:  DC to home today, no CM needs identified.  Expected Discharge Date:                  Expected Discharge Plan:  Home w Home Health Services  In-House Referral:     Discharge planning Services  CM Consult  Post Acute Care Choice:  NA Choice offered to:     DME Arranged:   (Pt has oxygen at home prior to admission, CM confirmed with patient) DME Agency:     HH Arranged:    HH Agency:     Status of Service:  Completed, signed off  Medicare Important Message Given:    Date Medicare IM Given:    Medicare IM give by:    Date Additional Medicare IM Given:    Additional Medicare Important Message give by:     If discussed at Long Length of Stay Meetings, dates discussed:    Additional Comments:  Lawerance Sabal, RN 10/12/2015, 11:57 AM

## 2015-10-12 NOTE — Progress Notes (Signed)
Interpreter Wyvonnia Dusky for Sarah RN discharge instructions ans Pharmacist

## 2015-10-12 NOTE — Telephone Encounter (Signed)
Tramadol ready for pick up Patient currently in hospital

## 2015-10-12 NOTE — Telephone Encounter (Signed)
Pt notified Rx at front office ready to be pick up  Information given in Spanish

## 2015-10-12 NOTE — Discharge Summary (Signed)
Physician Discharge Summary  Callista Hoh MRN: [id number] DOB/AGE: February 01, 1986 30 y.o.  PCP: Minerva Ends, MD   Admit date: 10/09/2015 Discharge date: 10/12/2015  Discharge Diagnoses:     Principal Problem:   HCAP (healthcare-associated pneumonia) Active Problems:   INTERSTITIAL LUNG DISEASE   Rheumatoid arthritis ([place])   Obesity, Class III, BMI 40-49.9 (morbid obesity) ([place])   Essential hypertension, benign   Anxiety and depression   Moderate obstructive sleep apnea   Chronic pain syndrome   Chronic anticoagulation   Disseminated lupus erythematosus ([place])   History of pulmonary embolism   Cough   Chronic respiratory failure with hypoxia (HCC)   Dyspnea   Acute on chronic respiratory failure with hypoxia (HCC)    Follow-up recommendations Follow-up with PCP in 3-5 days , including all  additional recommended appointments as below Follow-up CBC, CMP in 3-5 days      Medication List    STOP taking these medications        LILETTA (52 MG) 18.6 MCG/DAY Iud IUD  Generic drug:  levonorgestrel      TAKE these medications        albuterol 108 (90 Base) MCG/ACT inhaler  Commonly known as:  PROVENTIL HFA;VENTOLIN HFA  Inhale 2 puffs into the lungs every 4 (four) hours as needed for wheezing or shortness of breath.     azaTHIOprine 50 MG tablet  Commonly known as:  IMURAN  Take 5 tablets (250 mg total) by mouth daily.     celecoxib 200 MG capsule  Commonly known as:  CELEBREX  Take 1 capsule (200 mg total) by mouth 2 (two) times daily.     HYDROcodone-acetaminophen 5-325 MG tablet  Commonly known as:  NORCO/VICODIN  Take 1 tablet by mouth every 6 (six) hours as needed for moderate pain.     hydroxychloroquine 200 MG tablet  Commonly known as:  PLAQUENIL  Take 1 tablet (200 mg total) by mouth 2 (two) times daily.     levofloxacin 750 MG tablet  Commonly known as:  LEVAQUIN  Take 1 tablet (750 mg total) by mouth every evening.     omeprazole 40  MG capsule  Commonly known as:  PRILOSEC  Take 1 capsule (40 mg total) by mouth daily.     predniSONE 20 MG tablet  Commonly known as:  DELTASONE  Take 2 tablets (40 mg total) by mouth daily with breakfast.     rivaroxaban 20 MG Tabs tablet  Commonly known as:  XARELTO  Take 1 tablet (20 mg total) by mouth daily with supper.     traMADol 50 MG tablet  Commonly known as:  ULTRAM  Take 1 tablet (50 mg total) by mouth every 12 (twelve) hours as needed.         Discharge Condition: Stable    Discharge Instructions Get Medicines reviewed and adjusted: Please take all your medications with you for your next visit with your Primary MD  Please request your Primary MD to go over all hospital tests and procedure/radiological results at the follow up, please ask your Primary MD to get all Hospital records sent to his/her office.  If you experience worsening of your admission symptoms, develop shortness of breath, life threatening emergency, suicidal or homicidal thoughts you must seek medical attention immediately by calling 911 or calling your MD immediately if symptoms less severe.  You must read complete instructions/literature along with all the possible adverse reactions/side effects for all the Medicines you take and that have  been prescribed to you. Take any new Medicines after you have completely understood and accpet all the possible adverse reactions/side effects.   Do not drive when taking Pain medications.   Do not take more than prescribed Pain, Sleep and Anxiety Medications  Special Instructions: If you have smoked or chewed Tobacco in the last 2 yrs please stop smoking, stop any regular Alcohol and or any Recreational drug use.  Wear Seat belts while driving.  Please note  You were cared for by a hospitalist during your hospital stay. Once you are discharged, your primary care physician will handle any further medical issues. Please note that NO REFILLS for any  discharge medications will be authorized once you are discharged, as it is imperative that you return to your primary care physician (or establish a relationship with a primary care physician if you do not have one) for your aftercare needs so that they can reassess your need for medications and monitor your lab values.    No Known Allergies    Disposition: 01-Home or Self Care   Consults:  None   Significant Diagnostic Studies:  Dg Chest 2 View  10/10/2015  CLINICAL DATA:  30 year old female with history of chest pain and shortness of breath. EXAM: CHEST  2 VIEW COMPARISON:  Chest x-ray 10/09/2015. FINDINGS: Lung volumes are low. Patchy interstitial and airspace disease throughout the lungs bilaterally, most evident throughout the left lung, concerning for multilobar bronchopneumonia. No pleural effusions. Crowding of the pulmonary vasculature, accentuated by low lung volumes. Mild cardiomegaly. Upper mediastinal contours are within normal limits. IMPRESSION: 1. The appearance the chest suggests multilobar bronchopneumonia, most severe throughout the left lung. 2. Mild cardiomegaly. Electronically Signed   By: Vinnie Langton M.D.   On: 10/10/2015 09:31   Dg Chest 2 View  10/09/2015  CLINICAL DATA:  Productive cough. Shortness of breath. Wheezing for the past 3 days. Asthma. EXAM: CHEST  2 VIEW COMPARISON:  09/07/2015. FINDINGS: A poor inspiration is again demonstrated. Interval patchy opacity in the left upper lung zone. Stable prominence of the interstitial markings and mild central peribronchial thickening. Grossly normal sized heart. Diffuse osteopenia. IMPRESSION: 1. Mild left upper lobe pneumonia. 2. Stable mild bronchitic changes and mild chronic interstitial lung disease. Electronically Signed   By: Claudie Revering M.D.   On: 10/09/2015 08:13   Ct Angio Chest Pe W/cm &/or Wo Cm  10/09/2015  CLINICAL DATA:  31 year old female with a history of lupus, rheumatoid arthritis, interstitial  lung disease, asthma and acute shortness of breath. Evaluate for pulmonary embolus. EXAM: CT ANGIOGRAPHY CHEST WITH CONTRAST TECHNIQUE: Multidetector CT imaging of the chest was performed using the standard protocol during bolus administration of intravenous contrast. Multiplanar CT image reconstructions and MIPs were obtained to evaluate the vascular anatomy. CONTRAST:  100 mL Isovue 370 administered intravenously COMPARISON:  Prior chest x-ray 10/09/2015; prior CT scan of the chest 11/19/2014 FINDINGS: Mediastinum: 1.6 cm left thyroid nodule. Otherwise, the thoracic inlet is unremarkable. No mediastinal mass or adenopathy. Unremarkable thoracic esophagus. Heart/Vascular: Adequate opacification of the pulmonary arteries to the proximal segmental level. Evaluation beyond this is limited by respiratory motion artifact. No evidence of central filling defect to suggest acute pulmonary embolus. The main pulmonary artery is enlarged at 3.7 cm. Conventional 3 vessel aortic arch anatomy. No evidence of aneurysm. Mild cardiomegaly. No pericardial effusion. Lungs/Pleura: Moderate respiratory motion artifact. Scattered areas of geographic ground-glass attenuation airspace opacity are nonspecific in appearance but similar compared to prior imaging. Mild bronchial wall  thickening. Elevation of the right hemidiaphragm. No pneumothorax or pleural effusion. Bones/Soft Tissues: No acute fracture or aggressive appearing lytic or blastic osseous lesion. Upper Abdomen: Cholelithiasis. Otherwise, the visualized upper abdomen is unremarkable. Review of the MIP images confirms the above findings. IMPRESSION: 1. Negative for acute pulmonary embolus to the proximal segmental level. Evaluation of the more distal branches is limited by respiratory motion artifact. 2. Enlarged main pulmonary artery as can be seen in the setting of pulmonary arterial hypertension. 3. Scattered bilateral geographic regions of ground-glass attenuation airspace  opacity which are nonspecific in appearance but similar compared to prior imaging. Differential considerations include areas of atelectasis an adjacent air trapping secondary to small airways disease, active alveolitis, atypical respiratory infection, and less likely patchy pulmonary edema. 4. Mild cardiomegaly. 5. Incidental note is made of a 1.6 cm left thyroid nodule. Consider further evaluation on a nonemergent basis with a dedicated thyroid ultrasound. 6. Diffuse mild bronchial wall thickening. 7. Chronic elevation of the right hemidiaphragm. 8. Cholelithiasis. 9. Electronically Signed   By: Jacqulynn Cadet M.D.   On: 10/09/2015 14:11       Filed Weights   10/09/15 1611  Weight: 144.4 kg (318 lb 5.5 oz)     Microbiology: Recent Results (from the past 240 hour(s))  Urine culture     Status: None   Collection Time: 10/09/15  3:08 AM  Result Value Ref Range Status   Specimen Description URINE, CLEAN CATCH  Final   Special Requests Immunocompromised  Final   Culture MULTIPLE SPECIES PRESENT, SUGGEST RECOLLECTION  Final   Report Status 10/10/2015 FINAL  Final  Culture, blood (Routine X 2) w Reflex to ID Panel     Status: None (Preliminary result)   Collection Time: 10/09/15  9:37 AM  Result Value Ref Range Status   Specimen Description BLOOD LEFT ANTECUBITAL  Final   Special Requests BOTTLES DRAWN AEROBIC AND ANAEROBIC 5CC  Final   Culture NO GROWTH 2 DAYS  Final   Report Status PENDING  Incomplete  Culture, blood (Routine X 2) w Reflex to ID Panel     Status: None (Preliminary result)   Collection Time: 10/09/15 11:07 AM  Result Value Ref Range Status   Specimen Description BLOOD LEFT FOREARM  Final   Special Requests BOTTLES DRAWN AEROBIC AND ANAEROBIC 5CC  Final   Culture NO GROWTH 2 DAYS  Final   Report Status PENDING  Incomplete  MRSA PCR Screening     Status: None   Collection Time: 10/09/15  5:18 PM  Result Value Ref Range Status   MRSA by PCR NEGATIVE NEGATIVE Final     Comment:        The GeneXpert MRSA Assay (FDA approved for NASAL specimens only), is one component of a comprehensive MRSA colonization surveillance program. It is not intended to diagnose MRSA infection nor to guide or monitor treatment for MRSA infections.   Culture, blood (routine x 2) Call MD if unable to obtain prior to antibiotics being given     Status: None (Preliminary result)   Collection Time: 10/09/15  6:23 PM  Result Value Ref Range Status   Specimen Description BLOOD RIGHT ANTECUBITAL  Final   Special Requests IN PEDIATRIC BOTTLE 3CC  Final   Culture NO GROWTH 2 DAYS  Final   Report Status PENDING  Incomplete       Blood Culture    Component Value Date/Time   SDES BLOOD RIGHT ANTECUBITAL 10/09/2015 1823   SPECREQUEST IN PEDIATRIC BOTTLE  3CC 10/09/2015 1823   CULT NO GROWTH 2 DAYS 10/09/2015 1823   REPTSTATUS PENDING 10/09/2015 1823      Labs: Results for orders placed or performed during the hospital encounter of 10/09/15 (from the past 48 hour(s))  CBC     Status: Abnormal   Collection Time: 10/11/15  5:02 AM  Result Value Ref Range   WBC 7.1 4.0 - 10.5 K/uL   RBC 4.59 3.87 - 5.11 MIL/uL   Hemoglobin 10.1 (L) 12.0 - 15.0 g/dL   HCT 38.4 36.0 - 46.0 %   MCV 83.7 78.0 - 100.0 fL   MCH 22.0 (L) 26.0 - 34.0 pg   MCHC 26.3 (L) 30.0 - 36.0 g/dL   RDW 16.9 (H) 11.5 - 15.5 %   Platelets 302 150 - 400 K/uL  Basic metabolic panel     Status: Abnormal   Collection Time: 10/11/15  5:02 AM  Result Value Ref Range   Sodium 142 135 - 145 mmol/L   Potassium 3.9 3.5 - 5.1 mmol/L   Chloride 102 101 - 111 mmol/L   CO2 32 22 - 32 mmol/L   Glucose, Bld 89 65 - 99 mg/dL   BUN 14 6 - 20 mg/dL   Creatinine, Ser 0.56 0.44 - 1.00 mg/dL   Calcium 8.4 (L) 8.9 - 10.3 mg/dL   GFR calc non Af Amer >60 >60 mL/min   GFR calc Af Amer >60 >60 mL/min    Comment: (NOTE) The eGFR has been calculated using the CKD EPI equation. This calculation has not been validated in all  clinical situations. eGFR's persistently <60 mL/min signify possible Chronic Kidney Disease.    Anion gap 8 5 - 15  Magnesium     Status: None   Collection Time: 10/11/15  5:02 AM  Result Value Ref Range   Magnesium 1.9 1.7 - 2.4 mg/dL  Glucose, capillary     Status: Abnormal   Collection Time: 10/11/15  8:53 PM  Result Value Ref Range   Glucose-Capillary 116 (H) 65 - 99 mg/dL   Comment 1 Notify RN    Comment 2 Document in Chart   CBC     Status: Abnormal   Collection Time: 10/12/15  5:46 AM  Result Value Ref Range   WBC 7.3 4.0 - 10.5 K/uL   RBC 4.56 3.87 - 5.11 MIL/uL   Hemoglobin 10.2 (L) 12.0 - 15.0 g/dL   HCT 37.9 36.0 - 46.0 %   MCV 83.1 78.0 - 100.0 fL   MCH 22.4 (L) 26.0 - 34.0 pg   MCHC 26.9 (L) 30.0 - 36.0 g/dL   RDW 16.6 (H) 11.5 - 15.5 %   Platelets 301 150 - 400 K/uL  Comprehensive metabolic panel     Status: Abnormal   Collection Time: 10/12/15  5:46 AM  Result Value Ref Range   Sodium 141 135 - 145 mmol/L   Potassium 3.8 3.5 - 5.1 mmol/L   Chloride 99 (L) 101 - 111 mmol/L   CO2 33 (H) 22 - 32 mmol/L   Glucose, Bld 89 65 - 99 mg/dL   BUN 10 6 - 20 mg/dL   Creatinine, Ser 0.45 0.44 - 1.00 mg/dL   Calcium 8.6 (L) 8.9 - 10.3 mg/dL   Total Protein 6.0 (L) 6.5 - 8.1 g/dL   Albumin 3.3 (L) 3.5 - 5.0 g/dL   AST 22 15 - 41 U/L   ALT 17 14 - 54 U/L   Alkaline Phosphatase 49 38 - 126 U/L  Total Bilirubin 0.8 0.3 - 1.2 mg/dL   GFR calc non Af Amer >60 >60 mL/min   GFR calc Af Amer >60 >60 mL/min    Comment: (NOTE) The eGFR has been calculated using the CKD EPI equation. This calculation has not been validated in all clinical situations. eGFR's persistently <60 mL/min signify possible Chronic Kidney Disease.    Anion gap 9 5 - 15     Lipid Panel     Component Value Date/Time   CHOL 123 03/08/2012 0552   TRIG 71 03/08/2012 0552   HDL 45 03/08/2012 0552   CHOLHDL 2.7 03/08/2012 0552   VLDL 14 03/08/2012 0552   LDLCALC 64 03/08/2012 0552     Lab  Results  Component Value Date   HGBA1C 5.60 07/27/2015   HGBA1C 5.5 02/23/2014   HGBA1C 5.5 06/15/2013     Lab Results  Component Value Date   MICROALBUR 2.47* 03/07/2012   LDLCALC 64 03/08/2012   CREATININE 0.45 10/12/2015      Brief summary 30 y.o. female with a Past Medical History of of PE on Xarelto, RA, SLE, interstitial lung disease on home O2, chronic steroid use recently increased depression, anxiety, chronic pain syndrome, morbid obesity, OSA, presenting with progressive shortness of breath over the last 2-3 days and patient found to have healthcare associated pneumonia. She was recently hospitalized in January 2017 with acute respiratory failure with hypoxia in the setting of pneumonia   Assessment and plan Acute on chronic hypoxic respiratory failure,community-acquired pneumonia initially on presentation with tachypnea RR 27, wheezing, sinus tachycardia 115, hypoxia requiring NRB, CTA chest no PE, does has bronchial wall thickening, scatter bilateral ground-glass opacities consistent with pneumonia, she was treated with vanc/cefepime since admission, with rapid clinical improvement. Flu pcr negative, urine strep pneumo antigen negative, mrsa screen negative, Now back to baseline o2 requirement, d/c vanc, switched cefepime to vancomycin 7 more days ,     History of PE on Xarelto  CTA on admission, no PE, Continue Xarelto Results of the CT as above D-dimer elevated, however the patient is on Xarelto therefore no venous Doppler ordered  Rheumathoid Arthritis SLE On Imuran, and on chronic prednisone Analgesics as needed  Morbid Obesity Nutrition Consult for lifestyle modification   Hypertension Continue home anti-hypertensive medications.  Add Hydralazine Q6 hours as needed for SBP >160 and /or DBP >110.   Anxiety and depression Continue meds  Deconditioning  PT/OT , no PT follow-up needed    Discharge Exam   Blood pressure 99/53, pulse 81,  temperature 98.3 F (36.8 C), temperature source Oral, resp. rate 18, height '5\' 5"'  (1.651 m), weight 144.4 kg (318 lb 5.5 oz), last menstrual period 06/27/2015, SpO2 97 %.   General: Obese, NAD  Cardiovascular: RRR  Respiratory: CTABL  Abdomen: Soft/ND/NT, positive BS  Musculoskeletal: No Edema  Neuro: aaox3        Follow-up Information    Follow up with Minerva Ends, MD. Schedule an appointment as soon as possible for a visit in 3 days.   Specialty:  Family Medicine   Contact information:   [address] [place] [address] [phone]       Signed: Reyne Dumas 10/12/2015, 11:32 AM        Time spent >45 mins

## 2015-10-12 NOTE — Progress Notes (Signed)
Pt given discharge instructions, prescriptions, and care notes. Pt verbalized understanding AEB no further questions or concerns at this time. IV was discontinued, no redness, pain, or swelling noted at this time. Telemetry discontinued and Centralized Telemetry was notified. Pt left the floor via wheelchair with staff in stable condition. 

## 2015-10-13 ENCOUNTER — Other Ambulatory Visit: Payer: Self-pay | Admitting: Family Medicine

## 2015-10-13 ENCOUNTER — Telehealth: Payer: Self-pay

## 2015-10-13 MED FILL — HYDROCODON-APAP 5-325: 5-325 | 8 days supply | Qty: 30 | Fill #0

## 2015-10-13 MED FILL — levoFLOXacin 750 MG TABS: 750 | 7 days supply | Qty: 7 | Fill #0

## 2015-10-13 NOTE — Telephone Encounter (Signed)
The patient was in the [facility] waiting room speaking to [place] , spanish speaking [facility] scheduler. The patient had questions about her prescriptions for levaquin and vicodin. Both prescriptions were on the same paper. This CM spoke to [place], [facility] pharmacy tech who stated that the patient had been informed that the [facility] can't fill the vicodin and the paper with the 2 prescriptions on it can't be split to just fill the levaquin. . The patient would need to take the prescriptions to another pharmacy to have them both filled.  The patient does not have insurance. This CM called [facility] and spoke to [facility] who stated that the levaquin would be $9.25 and the vicodin would be $9.00 and the patient would need to bring a driver's license for identification.  Aldean Jewett explained these costs, need for a driver's license and the location of the Outpatient Pharmacy to the patient who sated that she understood and knows where the pharmacy is located.

## 2015-10-14 LAB — CULTURE, BLOOD (ROUTINE X 2)
CULTURE: NO GROWTH
Culture: NO GROWTH
Culture: NO GROWTH

## 2015-10-18 ENCOUNTER — Ambulatory Visit: Payer: Medicaid Other | Attending: Family Medicine | Admitting: Family Medicine

## 2015-10-18 ENCOUNTER — Encounter: Payer: Self-pay | Admitting: Family Medicine

## 2015-10-18 VITALS — BP 111/73 | HR 101 | Temp 98.9°F | Resp 18 | Ht 65.0 in | Wt 283.0 lb

## 2015-10-18 DIAGNOSIS — Z79899 Other long term (current) drug therapy: Secondary | ICD-10-CM | POA: Diagnosis not present

## 2015-10-18 DIAGNOSIS — Z975 Presence of (intrauterine) contraceptive device: Secondary | ICD-10-CM | POA: Insufficient documentation

## 2015-10-18 DIAGNOSIS — Z30430 Encounter for insertion of intrauterine contraceptive device: Secondary | ICD-10-CM | POA: Diagnosis not present

## 2015-10-18 DIAGNOSIS — Z7901 Long term (current) use of anticoagulants: Secondary | ICD-10-CM | POA: Insufficient documentation

## 2015-10-18 DIAGNOSIS — M069 Rheumatoid arthritis, unspecified: Secondary | ICD-10-CM

## 2015-10-18 DIAGNOSIS — J189 Pneumonia, unspecified organism: Secondary | ICD-10-CM

## 2015-10-18 DIAGNOSIS — M329 Systemic lupus erythematosus, unspecified: Secondary | ICD-10-CM | POA: Diagnosis not present

## 2015-10-18 DIAGNOSIS — A630 Anogenital (venereal) warts: Secondary | ICD-10-CM | POA: Insufficient documentation

## 2015-10-18 DIAGNOSIS — G894 Chronic pain syndrome: Secondary | ICD-10-CM | POA: Insufficient documentation

## 2015-10-18 DIAGNOSIS — M351 Other overlap syndromes: Secondary | ICD-10-CM

## 2015-10-18 DIAGNOSIS — M358 Other specified systemic involvement of connective tissue: Secondary | ICD-10-CM

## 2015-10-18 LAB — CBC
HEMATOCRIT: 36.6 % (ref 35.0–45.0)
HEMOGLOBIN: 10.1 g/dL — AB (ref 11.7–15.5)
MCH: 22.1 pg — ABNORMAL LOW (ref 27.0–33.0)
MCHC: 27.6 g/dL — ABNORMAL LOW (ref 32.0–36.0)
MCV: 80.3 fL (ref 80.0–100.0)
MPV: 9.1 fL (ref 7.5–12.5)
Platelets: 281 10*3/uL (ref 140–400)
RBC: 4.56 MIL/uL (ref 3.80–5.10)
RDW: 17.5 % — AB (ref 11.0–15.0)
WBC: 7.8 10*3/uL (ref 3.8–10.8)

## 2015-10-18 LAB — COMPLETE METABOLIC PANEL WITH GFR
ALBUMIN: 3.6 g/dL (ref 3.6–5.1)
ALT: 22 U/L (ref 6–29)
AST: 24 U/L (ref 10–30)
Alkaline Phosphatase: 51 U/L (ref 33–115)
BILIRUBIN TOTAL: 0.4 mg/dL (ref 0.2–1.2)
BUN: 8 mg/dL (ref 7–25)
CALCIUM: 8.1 mg/dL — AB (ref 8.6–10.2)
CHLORIDE: 103 mmol/L (ref 98–110)
CO2: 33 mmol/L — ABNORMAL HIGH (ref 20–31)
CREATININE: 0.43 mg/dL — AB (ref 0.50–1.10)
GFR, Est African American: >89 mL/min (ref >=60)
GFR, Est Non African American: >89 mL/min (ref >=60)
Glucose, Bld: 104 mg/dL — ABNORMAL HIGH (ref 65–99)
Potassium: 3.9 mmol/L (ref 3.5–5.3)
Sodium: 144 mmol/L (ref 135–146)
TOTAL PROTEIN: 5.8 g/dL — AB (ref 6.1–8.1)

## 2015-10-18 MED ORDER — PREDNISONE 20 MG PO TABS: 20.0000 mg | ORAL_TABLET | Freq: Every day | ORAL | Status: DC

## 2015-10-18 MED ORDER — TRAMADOL HCL 50 MG PO TABS: 100.0000 mg | ORAL_TABLET | Freq: Two times a day (BID) | ORAL | Status: DC | PRN

## 2015-10-18 MED ORDER — ALBUTEROL SULFATE HFA 108 (90 BASE) MCG/ACT IN AERS: 2.0000 | INHALATION_SPRAY | RESPIRATORY_TRACT | Status: DC | PRN

## 2015-10-18 MED ORDER — KETOROLAC TROMETHAMINE 60 MG/2ML IM SOLN
60.0000 mg | Freq: Once | INTRAMUSCULAR | Status: AC
  Administered 2015-10-18: 60 mg via INTRAMUSCULAR

## 2015-10-18 MED FILL — traMADol HCL 50 MG TABS: 50 | 30 days supply | Qty: 120 | Fill #0

## 2015-10-18 MED FILL — !VENTOLIN HFA INHALER: 108 (90 BAS | 28 days supply | Qty: 18 | Fill #0

## 2015-10-18 MED FILL — predniSONE 20 MG TABS: 20 | 30 days supply | Qty: 30 | Fill #0

## 2015-10-18 NOTE — Progress Notes (Signed)
Subjective:  Patient ID: Donna Hall, female    DOB: April 18, 1986  Age: 30 y.o. MRN: [id number]  Spanish interpreter used  CC: Contraception   HPI Donna Hall has mixed connective tissue disease, history of peripheral PE, ambulatory O2 requirement    presents for    1. IUD string check: she is s/p electric vacuum aspiration for induced abortion and IUD insertion at [facility] on 09/20/2015 (see Care Everywhere). She denies vaginal bleeding. She had some pelvic pain that has resolved. She request evaluation of genital warts.   2. HFU HCAP: she was hospitalized from 10/09/2015 to 10/11/2016 with SOB. CXR revealed LUL PNA. CTA was negative for acute PE. She required NRB. She was initially treated with vanc/cefepime. She improved quickly. She as sent home with Levaquin. Her prednisone dose was increased from 20 mg to 40 mg daily. She is taking Levaquin. She reports generalized aches and pains. No fever or chills. Using home O2. She has not seen her rheumatologist since 01/2014. She is compliant with imuran and plaquenil.   Social History  Substance Use Topics  . Smoking status: Never Smoker   . Smokeless tobacco: Never Used  . Alcohol Use: No    Outpatient Prescriptions Prior to Visit  Medication Sig Dispense Refill  . albuterol (PROVENTIL HFA;VENTOLIN HFA) 108 (90 BASE) MCG/ACT inhaler Inhale 2 puffs into the lungs every 4 (four) hours as needed for wheezing or shortness of breath. 1 Inhaler 3  . azaTHIOprine (IMURAN) 50 MG tablet Take 5 tablets (250 mg total) by mouth daily. 150 tablet 5  . celecoxib (CELEBREX) 200 MG capsule Take 1 capsule (200 mg total) by mouth 2 (two) times daily. 60 capsule 5  . HYDROcodone-acetaminophen (NORCO/VICODIN) 5-325 MG tablet Take 1 tablet by mouth every 6 (six) hours as needed for moderate pain. 30 tablet 0  . hydroxychloroquine (PLAQUENIL) 200 MG tablet Take 1 tablet (200 mg total) by mouth 2 (two) times daily. 60 tablet 5  . levofloxacin  (LEVAQUIN) 750 MG tablet Take 1 tablet (750 mg total) by mouth every evening. 7 tablet 0  . omeprazole (PRILOSEC) 40 MG capsule Take 1 capsule (40 mg total) by mouth daily. 30 capsule 3  . predniSONE (DELTASONE) 20 MG tablet Take 2 tablets (40 mg total) by mouth daily with breakfast. 10 tablet 0  . rivaroxaban (XARELTO) 20 MG TABS tablet Take 1 tablet (20 mg total) by mouth daily with supper. 30 tablet 5  . traMADol (ULTRAM) 50 MG tablet Take 1 tablet (50 mg total) by mouth every 12 (twelve) hours as needed. 60 tablet 0   No facility-administered medications prior to visit.    ROS Review of Systems  Constitutional: Positive for fatigue and unexpected weight change.  HENT: Positive for sore throat and trouble swallowing.   Respiratory: Positive for cough, chest tightness, shortness of breath and wheezing.   Cardiovascular: Negative for chest pain.  Gastrointestinal: Negative for abdominal pain.  Genitourinary: Positive for vaginal bleeding (scant ). Negative for pelvic pain.  Musculoskeletal: Positive for myalgias, joint swelling and arthralgias.  Neurological: Positive for headaches.  Psychiatric/Behavioral: Positive for sleep disturbance.    Objective:  BP 111/73 mmHg  Pulse 101  Temp(Src) 98.9 F (37.2 C) (Oral)  Resp 18  Ht 5\' 5"  (1.651 m)  Wt 283 lb (128.368 kg)  BMI 47.09 kg/m2  SpO2 93%  LMP 06/27/2015  BP/Weight 10/18/2015 09/27/2015 09/15/2015  Systolic BP 111 103 111  Diastolic BP 73 69 73  Wt. (Lbs) 283  280 277.2  BMI 47.09 49.61 49.12  Some encounter information is confidential and restricted. Go to Review Flowsheets activity to see all data.    Physical Exam  Constitutional: She appears well-developed and well-nourished. No distress.  Cardiovascular: Normal rate, regular rhythm, normal heart sounds and intact distal pulses.   Pulmonary/Chest: Effort normal. No respiratory distress. She has wheezes. She has no rales. She exhibits no tenderness.  Genitourinary:  Vagina normal and uterus normal.    Pelvic exam was performed with patient prone. There is lesion on the right labia. There is no rash or tenderness on the right labia. There is lesion on the left labia. There is no rash or tenderness on the left labia. Cervix exhibits no motion tenderness, no discharge and no friability.  Technically difficult Unable to visualize cervix or IUD strings   Musculoskeletal: She exhibits no edema.  Lymphadenopathy:       Right: No inguinal adenopathy present.       Left: No inguinal adenopathy present.  Skin: Skin is warm and dry. No rash noted.     Assessment & Plan:   There are no diagnoses linked to this encounter. Donna Hall was seen today for contraception.  Diagnoses and all orders for this visit:  Chronic pain syndrome -     ketorolac (TORADOL) injection 60 mg; Inject 2 mLs (60 mg total) into the muscle once. -     traMADol (ULTRAM) 50 MG tablet; Take 2 tablets (100 mg total) by mouth every 12 (twelve) hours as needed.  Genital warts -     Ambulatory referral to Gynecology  IUD contraception -     Ambulatory referral to Gynecology  Systemic lupus erythematosus (HCC) -     predniSONE (DELTASONE) 20 MG tablet; Take 1 tablet (20 mg total) by mouth daily with breakfast.  Rheumatoid arthritis involving multiple sites, unspecified rheumatoid factor presence (HCC) -     predniSONE (DELTASONE) 20 MG tablet; Take 1 tablet (20 mg total) by mouth daily with breakfast.  Connective tissue disease overlap syndrome (HCC) -     predniSONE (DELTASONE) 20 MG tablet; Take 1 tablet (20 mg total) by mouth daily with breakfast. -     Discontinue: albuterol (PROVENTIL HFA;VENTOLIN HFA) 108 (90 Base) MCG/ACT inhaler; Inhale 2 puffs into the lungs every 4 (four) hours as needed for wheezing or shortness of breath. -     albuterol (PROVENTIL HFA;VENTOLIN HFA) 108 (90 Base) MCG/ACT inhaler; Inhale 2 puffs into the lungs every 4 (four) hours as needed for wheezing or  shortness of breath.  HCAP (healthcare-associated pneumonia) -     CBC -     COMPLETE METABOLIC PANEL WITH GFR   Meds ordered this encounter  Medications  . ketorolac (TORADOL) injection 60 mg    Sig:   . traMADol (ULTRAM) 50 MG tablet    Sig: Take 2 tablets (100 mg total) by mouth every 12 (twelve) hours as needed.    Dispense:  120 tablet    Refill:  2  . predniSONE (DELTASONE) 20 MG tablet    Sig: Take 1 tablet (20 mg total) by mouth daily with breakfast.    Dispense:  30 tablet    Refill:  2  . DISCONTD: albuterol (PROVENTIL HFA;VENTOLIN HFA) 108 (90 Base) MCG/ACT inhaler    Sig: Inhale 2 puffs into the lungs every 4 (four) hours as needed for wheezing or shortness of breath.    Dispense:  1 Inhaler    Refill:  3  .  albuterol (PROVENTIL HFA;VENTOLIN HFA) 108 (90 Base) MCG/ACT inhaler    Sig: Inhale 2 puffs into the lungs every 4 (four) hours as needed for wheezing or shortness of breath.    Dispense:  1 Inhaler    Refill:  3    Follow-up: No Follow-up on file.   Dessa Phi MD

## 2015-10-18 NOTE — Patient Instructions (Addendum)
Vale was seen today for contraception.  Diagnoses and all orders for this visit:  Chronic pain syndrome -     ketorolac (TORADOL) injection 60 mg; Inject 2 mLs (60 mg total) into the muscle once. -     traMADol (ULTRAM) 50 MG tablet; Take 2 tablets (100 mg total) by mouth every 12 (twelve) hours as needed.  Genital warts -     Ambulatory referral to Gynecology  IUD contraception -     Ambulatory referral to Gynecology  Systemic lupus erythematosus (HCC) -     predniSONE (DELTASONE) 20 MG tablet; Take 1 tablet (20 mg total) by mouth daily with breakfast.  Rheumatoid arthritis involving multiple sites, unspecified rheumatoid factor presence (HCC) -     predniSONE (DELTASONE) 20 MG tablet; Take 1 tablet (20 mg total) by mouth daily with breakfast.  Connective tissue disease overlap syndrome (HCC) -     predniSONE (DELTASONE) 20 MG tablet; Take 1 tablet (20 mg total) by mouth daily with breakfast. -     albuterol (PROVENTIL HFA;VENTOLIN HFA) 108 (90 Base) MCG/ACT inhaler; Inhale 2 puffs into the lungs every 4 (four) hours as needed for wheezing or shortness of breath.  HCAP (healthcare-associated pneumonia) -     CBC -     COMPLETE METABOLIC PANEL WITH GFR   Finish levaquin Decrease prednisone back to 20 mg daily  You will be called with appt details for gynecology   Rheumatology follow-up at [facility] has been moved to 11/02/15 at 2:30pm with Dr. Denton Ar, MD  [address]  [place], [place] [address]  [phone]  [phone] (Fax)    F/u in 4 weeks for lung disease   Dr. Armen Pickup

## 2015-10-18 NOTE — Progress Notes (Signed)
F/U IUD  No problems with IUD  Pain scale #10 chronic body ache  No tobacco user  No suicidal  thoughts in the past two weeks

## 2015-10-21 NOTE — Assessment & Plan Note (Signed)
Genital warts in patient on immunosuppressive agents  GYN referral for treatment

## 2015-10-21 NOTE — Assessment & Plan Note (Signed)
Improving Complete levaquin Taper prednisone down to 20 mg Keep rheumatology f.u appt

## 2015-10-21 NOTE — Assessment & Plan Note (Signed)
IUD placed by Gyn following therapeutic abortion Unable to visualize strings Referred patient back to Gyn for f/u

## 2015-10-25 ENCOUNTER — Emergency Department (HOSPITAL_COMMUNITY)
Admission: EM | Admit: 2015-10-25 | Discharge: 2015-10-25 | Disposition: A | Discharge status: Home / Self Care | Payer: Self-pay | Attending: Emergency Medicine | Admitting: Emergency Medicine

## 2015-10-25 ENCOUNTER — Emergency Department (HOSPITAL_COMMUNITY): Payer: Self-pay

## 2015-10-25 ENCOUNTER — Encounter (HOSPITAL_COMMUNITY): Payer: Self-pay

## 2015-10-25 DIAGNOSIS — Z7901 Long term (current) use of anticoagulants: Secondary | ICD-10-CM | POA: Insufficient documentation

## 2015-10-25 DIAGNOSIS — S8991XA Unspecified injury of right lower leg, initial encounter: Secondary | ICD-10-CM

## 2015-10-25 DIAGNOSIS — W010XXA Fall on same level from slipping, tripping and stumbling without subsequent striking against object, initial encounter: Secondary | ICD-10-CM | POA: Insufficient documentation

## 2015-10-25 DIAGNOSIS — M069 Rheumatoid arthritis, unspecified: Secondary | ICD-10-CM | POA: Insufficient documentation

## 2015-10-25 DIAGNOSIS — Y9289 Other specified places as the place of occurrence of the external cause: Secondary | ICD-10-CM | POA: Insufficient documentation

## 2015-10-25 DIAGNOSIS — Z9981 Dependence on supplemental oxygen: Secondary | ICD-10-CM | POA: Insufficient documentation

## 2015-10-25 DIAGNOSIS — J45909 Unspecified asthma, uncomplicated: Secondary | ICD-10-CM | POA: Insufficient documentation

## 2015-10-25 DIAGNOSIS — I10 Essential (primary) hypertension: Secondary | ICD-10-CM | POA: Insufficient documentation

## 2015-10-25 DIAGNOSIS — Z872 Personal history of diseases of the skin and subcutaneous tissue: Secondary | ICD-10-CM | POA: Insufficient documentation

## 2015-10-25 DIAGNOSIS — Z8632 Personal history of gestational diabetes: Secondary | ICD-10-CM | POA: Insufficient documentation

## 2015-10-25 DIAGNOSIS — E669 Obesity, unspecified: Secondary | ICD-10-CM | POA: Insufficient documentation

## 2015-10-25 DIAGNOSIS — Y9389 Activity, other specified: Secondary | ICD-10-CM | POA: Insufficient documentation

## 2015-10-25 DIAGNOSIS — S3992XA Unspecified injury of lower back, initial encounter: Secondary | ICD-10-CM | POA: Insufficient documentation

## 2015-10-25 DIAGNOSIS — Z79899 Other long term (current) drug therapy: Secondary | ICD-10-CM | POA: Insufficient documentation

## 2015-10-25 DIAGNOSIS — G8929 Other chronic pain: Secondary | ICD-10-CM | POA: Insufficient documentation

## 2015-10-25 DIAGNOSIS — Y998 Other external cause status: Secondary | ICD-10-CM | POA: Insufficient documentation

## 2015-10-25 DIAGNOSIS — Z792 Long term (current) use of antibiotics: Secondary | ICD-10-CM | POA: Insufficient documentation

## 2015-10-25 DIAGNOSIS — W19XXXA Unspecified fall, initial encounter: Secondary | ICD-10-CM

## 2015-10-25 DIAGNOSIS — Z8701 Personal history of pneumonia (recurrent): Secondary | ICD-10-CM | POA: Insufficient documentation

## 2015-10-25 DIAGNOSIS — F329 Major depressive disorder, single episode, unspecified: Secondary | ICD-10-CM | POA: Insufficient documentation

## 2015-10-25 DIAGNOSIS — Z7952 Long term (current) use of systemic steroids: Secondary | ICD-10-CM | POA: Insufficient documentation

## 2015-10-25 DIAGNOSIS — Z86718 Personal history of other venous thrombosis and embolism: Secondary | ICD-10-CM | POA: Insufficient documentation

## 2015-10-25 MED ORDER — HYDROCODONE-ACETAMINOPHEN 5-325 MG PO TABS
1.0000 | ORAL_TABLET | Freq: Once | ORAL | Status: AC
  Administered 2015-10-25: 1 via ORAL
  Filled 2015-10-25: qty 1

## 2015-10-25 MED ORDER — NAPROXEN 500 MG PO TABS: 500.0000 mg | ORAL_TABLET | Freq: Two times a day (BID) | ORAL | Status: DC

## 2015-10-25 NOTE — ED Notes (Signed)
Interpreter utilized: [employer] [id number]

## 2015-10-25 NOTE — ED Notes (Signed)
Video Intrepreter utilized. Robbie Louis [phone]

## 2015-10-25 NOTE — ED Provider Notes (Signed)
CSN: [id number]     Arrival date & time 10/25/15  1255 History  By signing my name below, I, Iona Beard, attest that this documentation has been prepared under the direction and in the presence of Samantha Tripp [employer].  Electronically Signed: Iona Beard, ED Scribe 10/25/2015 at 4:28 PM.  Chief Complaint  Patient presents with  . Fall  . Knee Pain  . Hip Pain  . Back Pain   The history is provided by the patient. A language interpreter was used.  [id number] HPI Comments: Donna Hall is a 30 y.o. female with PMHx of HTN, rheumatoid arthritis, and chronic pain disorder who presents to the Emergency Department complaining of sudden onset, right knee pain s/p fall earlier today in which she slipped on a rug and landed with her legs spread below her. No LOC or head trauma noted in the incident. She was ambulatory after the accident. Pt reports associated right hip pain and right lower back pain. No other associated symptoms noted. Knee pain is worsened with walking. No other worsening or alleviating factors noted. Pt denies numbness, tingling, weakness, or any other pertinent symptoms.   Past Medical History  Diagnosis Date  . Psoriasis 2010    as a child  . Chronic pain disorder   . Obesity   . Chronic steroid use 2010  . Lupus (HCC) 2000  . Lupus (systemic lupus erythematosus) (HCC) 2010  . Rheumatoid arthritis(714.0) 2010  . Hypertension 2010  . Pneumonia "several times"  . Interstitial lung disease (HCC)     [name] 05/15/2014  . On home oxygen therapy     "2L; 24/7" (05/15/2014)  . Pulmonary embolism (HCC)     hx/notes 05/15/2014  . History of blood transfusion     "because white cells ate the red cells"  . Headache     "just when I have the pain from aching bones and psorasis and/or lupus symptoms" (05/15/2104)  . 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 Asthma   . Gestational diabetes 2006  .  Depression   . Sleep apnea    Past Surgical History  Procedure Laterality Date  . Thigh / knee soft tissue biopsy Right 2011    thigh  . Cesarean section  2010  . Induced abortion     Family History  Problem Relation Age of Onset  . Diabetes Mother   . Hypertension Mother   . Cancer Neg Hx   . Early death Neg Hx   . Heart disease Neg Hx    Social History  Substance Use Topics  . Smoking status: Never Smoker   . Smokeless tobacco: Never Used  . Alcohol Use: No   OB History    Gravida Para Term Preterm AB TAB SAB Ectopic Multiple Living   Review of Systems  Musculoskeletal: Positive for back pain and arthralgias.       Right knee pain  Neurological: Negative for weakness and numbness.  All other systems reviewed and are negative.    Allergies  Review of patient's allergies indicates no known allergies.  Home Medications   Prior to Admission medications   Medication Sig Start Date End Date Taking? Authorizing Provider  albuterol (PROVENTIL HFA;VENTOLIN HFA) 108 (90 Base) MCG/ACT inhaler Inhale 2 puffs into the lungs every 4 (four) hours as needed for wheezing or shortness  of breath. 10/18/15   Josalyn Funches, MD  azaTHIOprine (IMURAN) 50 MG tablet Take 5 tablets (250 mg total) by mouth daily. 09/27/15   Josalyn Funches, MD  celecoxib (CELEBREX) 200 MG capsule Take 1 capsule (200 mg total) by mouth 2 (two) times daily. 09/27/15   Josalyn Funches, MD  hydroxychloroquine (PLAQUENIL) 200 MG tablet Take 1 tablet (200 mg total) by mouth 2 (two) times daily. 09/27/15   Josalyn Funches, MD  levofloxacin (LEVAQUIN) 750 MG tablet Take 1 tablet (750 mg total) by mouth every evening. 10/12/15   Richarda Overlie, MD  omeprazole (PRILOSEC) 40 MG capsule Take 1 capsule (40 mg total) by mouth daily. 09/27/15   Josalyn Funches, MD  predniSONE (DELTASONE) 20 MG tablet Take 1 tablet (20 mg total) by mouth daily with breakfast. 10/18/15   Dessa Phi, MD  rivaroxaban (XARELTO)  20 MG TABS tablet Take 1 tablet (20 mg total) by mouth daily with supper. 09/27/15   Josalyn Funches, MD  traMADol (ULTRAM) 50 MG tablet Take 2 tablets (100 mg total) by mouth every 12 (twelve) hours as needed. 10/18/15   Josalyn Funches, MD   BP 116/77 mmHg  Pulse 96  Temp(Src) 98.9 F (37.2 C) (Oral)  Resp 16  SpO2 98%  LMP 06/27/2015 Physical Exam  Constitutional: She is oriented to person, place, and time. She appears well-developed and well-nourished. No distress.  HENT:  Head: Normocephalic and atraumatic.  Eyes: Conjunctivae are normal. Right eye exhibits no discharge. Left eye exhibits no discharge. No scleral icterus.  Cardiovascular: Normal rate.   Pulmonary/Chest: Effort normal.  Musculoskeletal:  Negative anterior/poster drawer bilaterally. Negative ballottement test. No varus or valgus laxity. No crepitus. Pain felt with flexion of knee. As well as palpation of anterior aspect.   Mild TTP of bilateral lumbar paraspinal muscles. No midline spinal tenderness. No step offs or obvious bony deformities.   No TTP of bilateral hips. Negative SLR. NO decrease ROM of hips. No pain with internal or external rotation of bilateral hips.    Neurological: She is alert and oriented to person, place, and time. Coordination normal.  Skin: Skin is warm and dry. No rash noted. She is not diaphoretic. No erythema. No pallor.  Psychiatric: She has a normal mood and affect. Her behavior is normal.  Nursing note and vitals reviewed.   ED Course  Procedures (including critical care time) DIAGNOSTIC STUDIES: Oxygen Saturation is 98% on RA, normal by my interpretation.    COORDINATION OF CARE: 3:17 PM-Discussed treatment plan which includes DG knee complete 4 views right and norco/vicodin with pt at bedside and pt agreed to plan.    Labs Review Labs Reviewed - No data to display  Imaging Review Dg Knee Complete 4 Views Right  10/25/2015  CLINICAL DATA:  Fall, right anterior knee pain EXAM:  RIGHT KNEE - COMPLETE 4+ VIEW COMPARISON:  03/24/2014 FINDINGS: Four views of the right knee submitted. No acute fracture or subluxation. Small joint effusion. Mild spurring of patella. No radiopaque foreign body. IMPRESSION: No acute fracture or subluxation. Mild spurring of patella. Small joint effusion. No radiopaque foreign body Electronically Signed   By: Natasha Mead M.D.   On: 10/25/2015 16:24   I have personally reviewed and evaluated these images as part of my medical decision-making.   EKG Interpretation None      MDM   Final diagnoses:  Fall, initial encounter  Knee injury, right, initial encounter   Patient X-Ray negative for obvious fracture or dislocation. Pain managed  in ED. Pt advised to follow up with orthopedics if symptoms persist for possibility of missed fracture diagnosis. Patient given brace while in ED, conservative therapy recommended and discussed. Pt ambulatory in the ED without difficulty. Patient will be dc home & is agreeable with above plan.  I personally performed the services described in this documentation, which was scribed in my presence. The recorded information has been reviewed and is accurate.     Lester Kinsman [place], PA-C 10/25/15 1707  Richardean Canal, MD 10/25/15 [phone]

## 2015-10-25 NOTE — Discharge Instructions (Signed)
Your x-rays did not show a fracture today. Please wear this brace on your knee for symptomatic control. Use crutches as needed. Apply ice to affected area. Take Naprosyn as needed for pain. Follow-up with orthopedic provider as indicated in your discharge instructions if her symptoms do not improve. Otherwise you may follow up with your primary care doctor. Return to the emergency department if you experience severe worsening of her symptoms, increased redness or swelling around your knee, fever, chills, chest pain or shortness of breath.

## 2015-10-25 NOTE — ED Notes (Signed)
Per EMS- patient was reaching for a cucumber at [employer]. The floor was wet under a rug and the rug slid with the patient causing a fall. Patient c/o right knee pain , right hip pain, and right lower back pain. Patient denies hitting her head or having LOC.

## 2015-10-27 ENCOUNTER — Telehealth: Payer: Self-pay

## 2015-10-27 NOTE — Telephone Encounter (Addendum)
Spoke to the patient when she was at the [facility] today to pick up medication. Horald Chestnut, [facility] financial counselor who is Spanish speaking was able to interpret and reminded the patient that she still needs to obtain a notarized statement from her husband's employer confirming his income. The patient said that she is still working on it.   Porfirio Mylar reminded her to pick up the filters for her oxygen concentrator at [facility].  Porfirio Mylar also reminded her of her appointment at [facility] rheumatology clinic in [place], next week, 11/02/15 and provided the patient with a written reminder with the date and time of the appointment. The patient stated that she is still waiting to hear from a friend about transportation to [place].  She confirmed that she has been to the rheumatology clinic there before. Stressed with the patient the importance of keeping the rheumatology appointment and she verbalized understanding.

## 2015-10-28 ENCOUNTER — Telehealth: Payer: Self-pay | Admitting: *Deleted

## 2015-10-28 NOTE — Telephone Encounter (Signed)
-----   Message from Dessa Phi, MD sent at 10/19/2015  9:20 AM EDT ----- Labs stable  Continue current care plan

## 2015-10-28 NOTE — Telephone Encounter (Signed)
Date of birth verified by pt  Lab results given  Pt verbalized understanding  Information given in spanish  

## 2015-10-29 ENCOUNTER — Telehealth: Payer: Self-pay

## 2015-10-29 NOTE — Telephone Encounter (Signed)
This Case Manager placed call to patient using Landscape [employer] (Interpreter # [phone]) to determine if patient has transportation to her upcoming Rheumatology appointment on 11/02/15. Unable to reach patient; HIPPA compliant voicemail left requesting patient return call.

## 2015-11-01 ENCOUNTER — Telehealth: Payer: Self-pay

## 2015-11-01 NOTE — Telephone Encounter (Signed)
[place], [facility] scheduler attempted to contact the patient to confirm she has arranged for transportation to her medical appointment tomorrow, 11/02/15 @ 1430 with Ubaldo Glassing, MD at [facility].  A voicemail message was left requesting a call back to # [phone].

## 2015-11-01 NOTE — Telephone Encounter (Signed)
As per [place], [facility] scheduler, the patient called back and confirmed that she has a ride to her appointment at [facility], her friend will be driving her.

## 2015-11-16 MED FILL — CELECOXIB 200 MG CAPSULE: 200 | 30 days supply | Qty: 60 | Fill #1

## 2015-11-16 MED FILL — azaTHIOprine 50 MG TABS: 50 | 30 days supply | Qty: 150 | Fill #1

## 2015-11-16 MED FILL — ?HYDROXYCHLOROQUINE 200 MG: 200 | 30 days supply | Qty: 60 | Fill #1

## 2015-11-16 MED FILL — FOLIC ACID 1 MG TABLET: 1 | 30 days supply | Qty: 30 | Fill #1

## 2015-11-16 MED FILL — OMEPRAZOLE DR 40 MG CAPSULE: 40 | 30 days supply | Qty: 30 | Fill #1

## 2015-11-23 ENCOUNTER — Ambulatory Visit (INDEPENDENT_AMBULATORY_CARE_PROVIDER_SITE_OTHER): Payer: Self-pay | Admitting: Oncology

## 2015-11-23 DIAGNOSIS — Z86711 Personal history of pulmonary embolism: Secondary | ICD-10-CM

## 2015-11-23 DIAGNOSIS — Z7901 Long term (current) use of anticoagulants: Secondary | ICD-10-CM

## 2015-11-23 NOTE — Progress Notes (Signed)
Patient ID: Donna Hall, female   DOB: 10-21-1985, 30 y.o.   MRN: [id number] The patient failed to report for this visit. I spent over 45 minutes reviewing her hospital records. She was referred for advice on whether or not she needs to be on chronic anticoagulation. I can find no documentation that she actually had a pulmonary embolism in the past. She has chronic respiratory problems related to asthma, obesity obstructive sleep apnea syndrome, and idiopathic interstitial lung disease with multiple hospital admissions for hypoxic respiratory failure. I counted 11 CT angiograms of the chest done between June 2007 and April 2017 with 10 out of the11 studies done since September 2013. None of these studies were diagnostic for pulmonary embolism. Radiologists frequently made the statement that due to her morbid obesity, they could not completely exclude the possibility of small peripheral emboli. Groundglass pulmonary infiltrates have been noted and are chronic but are always interpreted as pneumonia by admitting clinicians who then put her on broad-spectrum antibiotics. A statement was copied in the chart that says that she had a pulmonary embolism on 03/16/2014. What is actually documented in the chart is that she had a CT angiogram on 03/01/2014 that was negative for pulmonary emboli. She had a overnight admission on 03/12/2014 for shortness of breath and chest tightness and was diagnosed with pneumonia. There is no discharge summary for this admission. She was evaluated by an academic pulmonologist at [facility] on 04/11/2014 and there is no mention of a pulmonary embolus. CT angiogram of the chest on 03/12/2014 at that institution showed no evidence for pulmonary emboli. This study was compared to a 01/09/2014 study which also showed no pulmonary emboli.  Next admission in our system was 05/15/2014. Notes from that admission now show that she is on Xarelto. She has had  elevated d-dimer tests on 07/28/2014 ( 0.72), and on 10/09/2015 (1.99). CT angiograms done at those times were negative. She has underlying collagen vascular disorder variously labeled as either lupus or rheumatoid arthritis. She is on chronic immunosuppressive steroids.  Impression: No objective evidence by radiographic studies that this woman has ever had a pulmonary embolus. She is 30 years old and she is being subjected  to an inordinate amount of radiation. I see no reason to keep her on chronic anticoagulation or to continue to get repetitive CT angiograms every time she has respiratory symptoms.  Cephas Darby, MD, FACP  Hematology-Oncology/Internal Medicine

## 2015-12-01 ENCOUNTER — Encounter: Payer: Medicaid Other | Admitting: Obstetrics and Gynecology

## 2015-12-07 MED FILL — azaTHIOprine 50 MG TABS: 50 | 30 days supply | Qty: 150 | Fill #2

## 2015-12-07 MED FILL — traMADol HCL 50 MG TABS: 50 | 30 days supply | Qty: 120 | Fill #1

## 2015-12-07 MED FILL — predniSONE 20 MG TABS: 20 | 30 days supply | Qty: 30 | Fill #1

## 2015-12-10 ENCOUNTER — Encounter: Payer: Medicaid Other | Admitting: Obstetrics & Gynecology

## 2016-01-12 MED FILL — CELECOXIB 200 MG CAPSULE: 200 | 30 days supply | Qty: 60 | Fill #2

## 2016-01-12 MED FILL — ?HYDROXYCHLOROQUINE 200 MG: 200 | 30 days supply | Qty: 60 | Fill #2

## 2016-01-12 MED FILL — OMEPRAZOLE DR 40 MG CAPSULE: 40 | 30 days supply | Qty: 30 | Fill #2

## 2016-01-12 MED FILL — traMADol HCL 50 MG TABS: 50 | 30 days supply | Qty: 120 | Fill #2

## 2016-01-19 MED FILL — azaTHIOprine 50 MG TABS: 50 | 30 days supply | Qty: 150 | Fill #3

## 2016-01-20 ENCOUNTER — Ambulatory Visit: Payer: Medicaid Other

## 2016-02-01 ENCOUNTER — Ambulatory Visit: Payer: Medicaid Other | Attending: Internal Medicine | Admitting: Internal Medicine

## 2016-02-01 ENCOUNTER — Encounter: Payer: Self-pay | Admitting: Internal Medicine

## 2016-02-01 VITALS — BP 121/72 | HR 89 | Temp 98.7°F | Resp 16 | Wt 248.8 lb

## 2016-02-01 DIAGNOSIS — F329 Major depressive disorder, single episode, unspecified: Secondary | ICD-10-CM | POA: Insufficient documentation

## 2016-02-01 DIAGNOSIS — J849 Interstitial pulmonary disease, unspecified: Secondary | ICD-10-CM | POA: Diagnosis not present

## 2016-02-01 DIAGNOSIS — Z86711 Personal history of pulmonary embolism: Secondary | ICD-10-CM | POA: Insufficient documentation

## 2016-02-01 DIAGNOSIS — J45909 Unspecified asthma, uncomplicated: Secondary | ICD-10-CM | POA: Insufficient documentation

## 2016-02-01 DIAGNOSIS — Z7952 Long term (current) use of systemic steroids: Secondary | ICD-10-CM | POA: Insufficient documentation

## 2016-02-01 DIAGNOSIS — Z833 Family history of diabetes mellitus: Secondary | ICD-10-CM | POA: Insufficient documentation

## 2016-02-01 DIAGNOSIS — I10 Essential (primary) hypertension: Secondary | ICD-10-CM | POA: Diagnosis not present

## 2016-02-01 DIAGNOSIS — L409 Psoriasis, unspecified: Secondary | ICD-10-CM | POA: Diagnosis not present

## 2016-02-01 DIAGNOSIS — G4730 Sleep apnea, unspecified: Secondary | ICD-10-CM | POA: Insufficient documentation

## 2016-02-01 DIAGNOSIS — E669 Obesity, unspecified: Secondary | ICD-10-CM | POA: Diagnosis not present

## 2016-02-01 DIAGNOSIS — Z809 Family history of malignant neoplasm, unspecified: Secondary | ICD-10-CM | POA: Diagnosis not present

## 2016-02-01 DIAGNOSIS — Z8249 Family history of ischemic heart disease and other diseases of the circulatory system: Secondary | ICD-10-CM | POA: Diagnosis not present

## 2016-02-01 DIAGNOSIS — Z9981 Dependence on supplemental oxygen: Secondary | ICD-10-CM | POA: Insufficient documentation

## 2016-02-01 DIAGNOSIS — R509 Fever, unspecified: Secondary | ICD-10-CM

## 2016-02-01 DIAGNOSIS — M329 Systemic lupus erythematosus, unspecified: Secondary | ICD-10-CM | POA: Diagnosis not present

## 2016-02-01 DIAGNOSIS — M069 Rheumatoid arthritis, unspecified: Secondary | ICD-10-CM | POA: Insufficient documentation

## 2016-02-01 DIAGNOSIS — Z79899 Other long term (current) drug therapy: Secondary | ICD-10-CM | POA: Diagnosis not present

## 2016-02-01 LAB — BASIC METABOLIC PANEL WITH GFR
BUN: 11 mg/dL (ref 7–25)
CO2: 25 mmol/L (ref 20–31)
Calcium: 8.4 mg/dL — ABNORMAL LOW (ref 8.6–10.2)
Chloride: 106 mmol/L (ref 98–110)
Creat: 0.48 mg/dL — ABNORMAL LOW (ref 0.50–1.10)
GFR, Est Non African American: >89 mL/min (ref >=60)
GLUCOSE: 121 mg/dL — AB (ref 65–99)
POTASSIUM: 4.1 mmol/L (ref 3.5–5.3)
Sodium: 140 mmol/L (ref 135–146)

## 2016-02-01 LAB — POCT URINALYSIS DIPSTICK
Blood, UA: NEGATIVE
Glucose, UA: NEGATIVE
KETONES UA: NEGATIVE
PROTEIN UA: 30
SPEC GRAV UA: 1.015
Urobilinogen, UA: >=8
pH, UA: 8.5

## 2016-02-01 LAB — CBC WITH DIFFERENTIAL/PLATELET
BASOS ABS: 0 {cells}/uL (ref 0–200)
Basophils Relative: 0 %
EOS ABS: 0 {cells}/uL — AB (ref 15–500)
Eosinophils Relative: 0 %
HEMATOCRIT: 32.6 % — AB (ref 35.0–45.0)
HEMOGLOBIN: 9.3 g/dL — AB (ref 11.7–15.5)
LYMPHS ABS: 791 {cells}/uL — AB (ref 850–3900)
Lymphocytes Relative: 7 %
MCH: 19.6 pg — AB (ref 27.0–33.0)
MCHC: 28.5 g/dL — AB (ref 32.0–36.0)
MCV: 68.8 fL — AB (ref 80.0–100.0)
MONO ABS: 1130 {cells}/uL — AB (ref 200–950)
MPV: 8.7 fL (ref 7.5–12.5)
Monocytes Relative: 10 %
NEUTROS PCT: 83 %
Neutro Abs: 9379 cells/uL — ABNORMAL HIGH (ref 1500–7800)
Platelets: 338 10*3/uL (ref 140–400)
RBC: 4.74 MIL/uL (ref 3.80–5.10)
RDW: 17.1 % — ABNORMAL HIGH (ref 11.0–15.0)
WBC: 11.3 10*3/uL — ABNORMAL HIGH (ref 3.8–10.8)

## 2016-02-01 LAB — POCT RAPID STREP A (OFFICE): RAPID STREP A SCREEN: NEGATIVE

## 2016-02-01 LAB — POCT URINE PREGNANCY: Preg Test, Ur: NEGATIVE

## 2016-02-01 MED ORDER — SULFAMETHOXAZOLE-TRIMETHOPRIM 400-80 MG PO TABS: 1.0000 | ORAL_TABLET | Freq: Two times a day (BID) | ORAL | 0 refills | Status: DC

## 2016-02-01 MED ORDER — TRIAMCINOLONE ACETONIDE 0.1 % EX CREA: 1.0000 "application " | TOPICAL_CREAM | Freq: Two times a day (BID) | CUTANEOUS | 0 refills | Status: DC

## 2016-02-01 MED FILL — SULFAMETHOXAZOLE-TMP SS TAB: 400-80 | 5 days supply | Qty: 10 | Fill #0

## 2016-02-01 MED FILL — TRIAMCINOLONE 0.1% CREAM: 0.1 | 15 days supply | Qty: 30 | Fill #0
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
## 2016-02-01 NOTE — Patient Instructions (Signed)
Please f/u with your Rheumotologist in regards to all the medications.  Acetaminophen tablets or caplets Qu es este medicamento? El ACETAMINOFENO es un analgsico. Se utiliza para tratar los dolores leves y para Personal assistant fiebre. Este medicamento puede ser utilizado para otros usos; si tiene alguna pregunta consulte con su proveedor de atencin mdica o con su farmacutico. Qu le debo informar a mi profesional de la salud antes de tomar este medicamento? Necesita saber si usted presenta alguno de los siguientes problemas o situaciones: -si consume alcohol con frecuencia -enfermedad heptica -una reaccin alrgica o inusual al acetaminofeno, a otros medicamentos, alimentos, colorantes o conservantes -si est embarazada o buscando quedar embarazada -si est amamantando a un beb Cmo debo utilizar este medicamento? Tome este medicamento por va oral con un vaso de agua. Siga las instrucciones de la etiqueta o del envase del medicamento. Tome sus dosis a intervalos regulares. No tome su medicamento con una frecuencia mayor a la indicada. Hable con su pediatra para informarse acerca del uso de este medicamento en nios. Aunque este medicamento ha sido recetado a nios tan menores como de 6 aos de edad para condiciones selectivas, las precauciones se aplican. Sobredosis: Pngase en contacto inmediatamente con un centro toxicolgico o una sala de urgencia si usted cree que haya tomado demasiado medicamento. ATENCIN: [employer] es solo para usted. No comparta este medicamento con nadie. Qu sucede si me olvido de una dosis? Si olvida una dosis, tmela lo antes posible. Si es casi la hora de la prxima dosis, tome slo esa dosis. No tome dosis adicionales o dobles. Qu puede interactuar con este medicamento? -alcohol -imatinib -isoniazida -otros medicamentos con acetaminofeno Puede ser que esta lista no menciona todas las posibles interacciones. Informe a su profesional de [employer] de  [employer] productos a base de hierbas, medicamentos de [employer] o suplementos nutritivos que est tomando. Si usted fuma, consume bebidas alcohlicas o si utiliza drogas ilegales, indqueselo tambin a su profesional de [employer]. Algunas sustancias pueden interactuar con su medicamento. A qu debo estar atento al usar [employer]? Informe a su mdico o su profesional de la salud si el dolor persiste durante ms de 10 das (5 [address]  [address] para los nios), si empeora o si experimenta un dolor nuevo o de tipo diferente. Adems, consulte con su mdico si la fiebre persiste durante ms de 3 das. No tome otros medicamentos que contienen acetaminofeno con [employer]. Lea siempre las etiquetas cuidadosamente. Si tiene preguntas, consulte a su mdico o su farmacutico. Si toma demasiado acetaminofeno consigue ayuda mdica enseguida. Tomando mucho acetaminofeno puede ser muy peligroso y causar dao al hgado. Aun si no tiene sntomas, es importante que usted consigas ayuda enseguida. Qu efectos secundarios puedo tener al [employer] este medicamento? Efectos secundarios que debe informar a su mdico o a Producer, television/film/video de la salud tan pronto como sea posible: -Therapist, art como erupcin cutnea, picazn o urticarias, hinchazn de la cara, labios o lengua -problemas respiratorios -fiebre o dolor de garganta -enrojecimiento, formacin de ampollas, descamacin o distensin de la piel, inclusive dentro de la boca -dificultad para orinar o cambios en el volumen de orina -sangrado o magulladuras inusuales -cansancio o debilidad inusual -color amarillento de ojos o piel Efectos secundarios que, por lo general, no requieren atencin mdica (debe informarlos a su mdico o a su profesional de la salud si persisten o si son molestos): -dolor de cabeza -nuseas, Programme researcher, broadcasting/film/video Puede ser que esta lista no menciona todos los posibles efectos secundarios.  Comunquese a su mdico por asesoramiento mdico  [employer]. Usted puede informar los efectos secundarios a la FDA por telfono al 1-800-FDA-1088. Dnde debo guardar mi medicina? Mantngala fuera del alcance de los nios. Gurdela a [employer], entre 20 y 25 grados C (61 y 53 grados F). Protjala de la humedad y del Airline pilot. Deseche todo el medicamento que no haya utilizado, despus de la fecha de vencimiento. ATENCIN: Este folleto es un resumen. Puede ser que no cubra toda la posible informacin. Si usted tiene preguntas acerca de esta medicina, consulte con su mdico, su farmacutico o su profesional de Radiographer, therapeutic.    2016, Elsevier/Gold Standard. (2014-08-04 00:00:00)

## 2016-02-02 ENCOUNTER — Other Ambulatory Visit: Payer: Self-pay | Admitting: Internal Medicine

## 2016-02-02 ENCOUNTER — Telehealth: Payer: Self-pay

## 2016-02-02 DIAGNOSIS — D509 Iron deficiency anemia, unspecified: Secondary | ICD-10-CM

## 2016-02-02 NOTE — Progress Notes (Signed)
Open in error

## 2016-02-02 NOTE — Telephone Encounter (Signed)
[employer] Onalee Hua [id number] Contacted pt to go over lab results pt is aware of lab results pt states she will come sometimes this week or next week to get lab work done

## 2016-02-15 ENCOUNTER — Other Ambulatory Visit: Payer: Self-pay | Admitting: Family Medicine

## 2016-02-15 DIAGNOSIS — G894 Chronic pain syndrome: Secondary | ICD-10-CM

## 2016-02-15 MED FILL — OMEPRAZOLE DR 40 MG CAPSULE: 40 | 30 days supply | Qty: 30 | Fill #3

## 2016-02-15 MED FILL — ?HYDROXYCHLOROQUINE 200 MG: 200 | 30 days supply | Qty: 60 | Fill #3

## 2016-02-15 MED FILL — predniSONE 20 MG TABS: 20 | 30 days supply | Qty: 30 | Fill #2

## 2016-02-15 MED FILL — CELECOXIB 200 MG CAPSULE: 200 | 30 days supply | Qty: 60 | Fill #3

## 2016-02-15 MED FILL — azaTHIOprine 50 MG TABS: 50 | 30 days supply | Qty: 150 | Fill #4

## 2016-02-15 NOTE — Telephone Encounter (Signed)
Please call patinet  Tramadol ready for pick up

## 2016-02-16 ENCOUNTER — Other Ambulatory Visit: Payer: Self-pay | Admitting: Family Medicine

## 2016-02-16 DIAGNOSIS — G894 Chronic pain syndrome: Secondary | ICD-10-CM

## 2016-02-16 NOTE — Telephone Encounter (Signed)
Left a message to inform patient of script being ready for pick up.

## 2016-02-17 ENCOUNTER — Telehealth: Payer: Self-pay

## 2016-02-21 MED FILL — VENTOLIN HFA 90 MCG INHALER: 108 (90 BAS | 20 days supply | Qty: 18 | Fill #0

## 2016-02-21 MED FILL — DOXYCYCLINE 100 MG TABLET: 100 | 10 days supply | Qty: 20 | Fill #0

## 2016-02-21 MED FILL — traMADol HCL 50 MG TABS: 50 | 2 days supply | Qty: 10 | Fill #0

## 2016-02-22 ENCOUNTER — Ambulatory Visit: Payer: Medicaid Other | Admitting: Internal Medicine

## 2016-02-23 NOTE — Telephone Encounter (Signed)
Pt was informed of results

## 2016-03-01 MED FILL — traMADol HCL 50 MG TABS: 50 | 30 days supply | Qty: 60 | Fill #0

## 2016-04-03 ENCOUNTER — Telehealth: Payer: Self-pay | Admitting: Family Medicine

## 2016-04-03 MED FILL — traMADol HCL 50 MG TABS: 50 | 30 days supply | Qty: 60 | Fill #1

## 2016-04-03 NOTE — Telephone Encounter (Signed)
Patient has refills already on tramadol Please inform her to contact pharmacy

## 2016-04-03 NOTE — Telephone Encounter (Signed)
Patient is needing tramadol. °Please follow up. °

## 2016-04-10 ENCOUNTER — Emergency Department (HOSPITAL_COMMUNITY): Payer: Self-pay

## 2016-04-10 ENCOUNTER — Encounter (HOSPITAL_COMMUNITY): Payer: Self-pay | Admitting: *Deleted

## 2016-04-10 DIAGNOSIS — Y929 Unspecified place or not applicable: Secondary | ICD-10-CM | POA: Insufficient documentation

## 2016-04-10 DIAGNOSIS — Y999 Unspecified external cause status: Secondary | ICD-10-CM | POA: Insufficient documentation

## 2016-04-10 DIAGNOSIS — S2231XA Fracture of one rib, right side, initial encounter for closed fracture: Secondary | ICD-10-CM | POA: Insufficient documentation

## 2016-04-10 DIAGNOSIS — Y939 Activity, unspecified: Secondary | ICD-10-CM | POA: Insufficient documentation

## 2016-04-10 DIAGNOSIS — J45909 Unspecified asthma, uncomplicated: Secondary | ICD-10-CM | POA: Insufficient documentation

## 2016-04-10 DIAGNOSIS — I10 Essential (primary) hypertension: Secondary | ICD-10-CM | POA: Insufficient documentation

## 2016-04-10 LAB — POC URINE PREG, ED: Preg Test, Ur: NEGATIVE
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
## 2016-04-11 ENCOUNTER — Emergency Department (HOSPITAL_COMMUNITY)
Admission: EM | Admit: 2016-04-11 | Discharge: 2016-04-11 | Disposition: A | Discharge status: Home / Self Care | Payer: Self-pay | Attending: Emergency Medicine | Admitting: Emergency Medicine

## 2016-04-11 DIAGNOSIS — S2231XA Fracture of one rib, right side, initial encounter for closed fracture: Secondary | ICD-10-CM

## 2016-04-11 MED ORDER — IBUPROFEN 800 MG PO TABS
800.0000 mg | ORAL_TABLET | Freq: Once | ORAL | Status: AC
  Administered 2016-04-11: 800 mg via ORAL
  Filled 2016-04-11: qty 1

## 2016-04-11 MED ORDER — OXYCODONE HCL 5 MG PO TABS: 5.0000 mg | ORAL_TABLET | ORAL | 0 refills | Status: DC | PRN

## 2016-04-11 MED ORDER — OXYCODONE HCL 5 MG PO TABS
5.0000 mg | ORAL_TABLET | Freq: Once | ORAL | Status: AC
Start: 2016-04-11 — End: 2016-04-11
  Administered 2016-04-11: 5 mg via ORAL
  Filled 2016-04-11: qty 1

## 2016-04-11 MED ORDER — ACETAMINOPHEN 500 MG PO TABS
1000.0000 mg | ORAL_TABLET | Freq: Once | ORAL | Status: AC
  Administered 2016-04-11: 1000 mg via ORAL
  Filled 2016-04-11: qty 2

## 2016-04-11 NOTE — ED Notes (Signed)
Pt perform Incentive Spirometry per RT education. Pt was able to achieve 800-1045ml on the IS, good patient effort, but patient stated that it did cause some pain. Discomfort was noted per RT. Pt is stable at this time.

## 2016-04-11 NOTE — Discharge Instructions (Signed)
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
## 2016-04-12 IMAGING — CR DG CHEST 1V PORT
1 series · 1 of 1 positions shown · non-contrast
Comparison: Multiple prior, most recent plain film 10/23/2014.
Prior CT 07/29/2014

CLINICAL DATA: 28-year-old female with a history of productive
cough

EXAM:
PORTABLE CHEST - 1 VIEW

[AP]
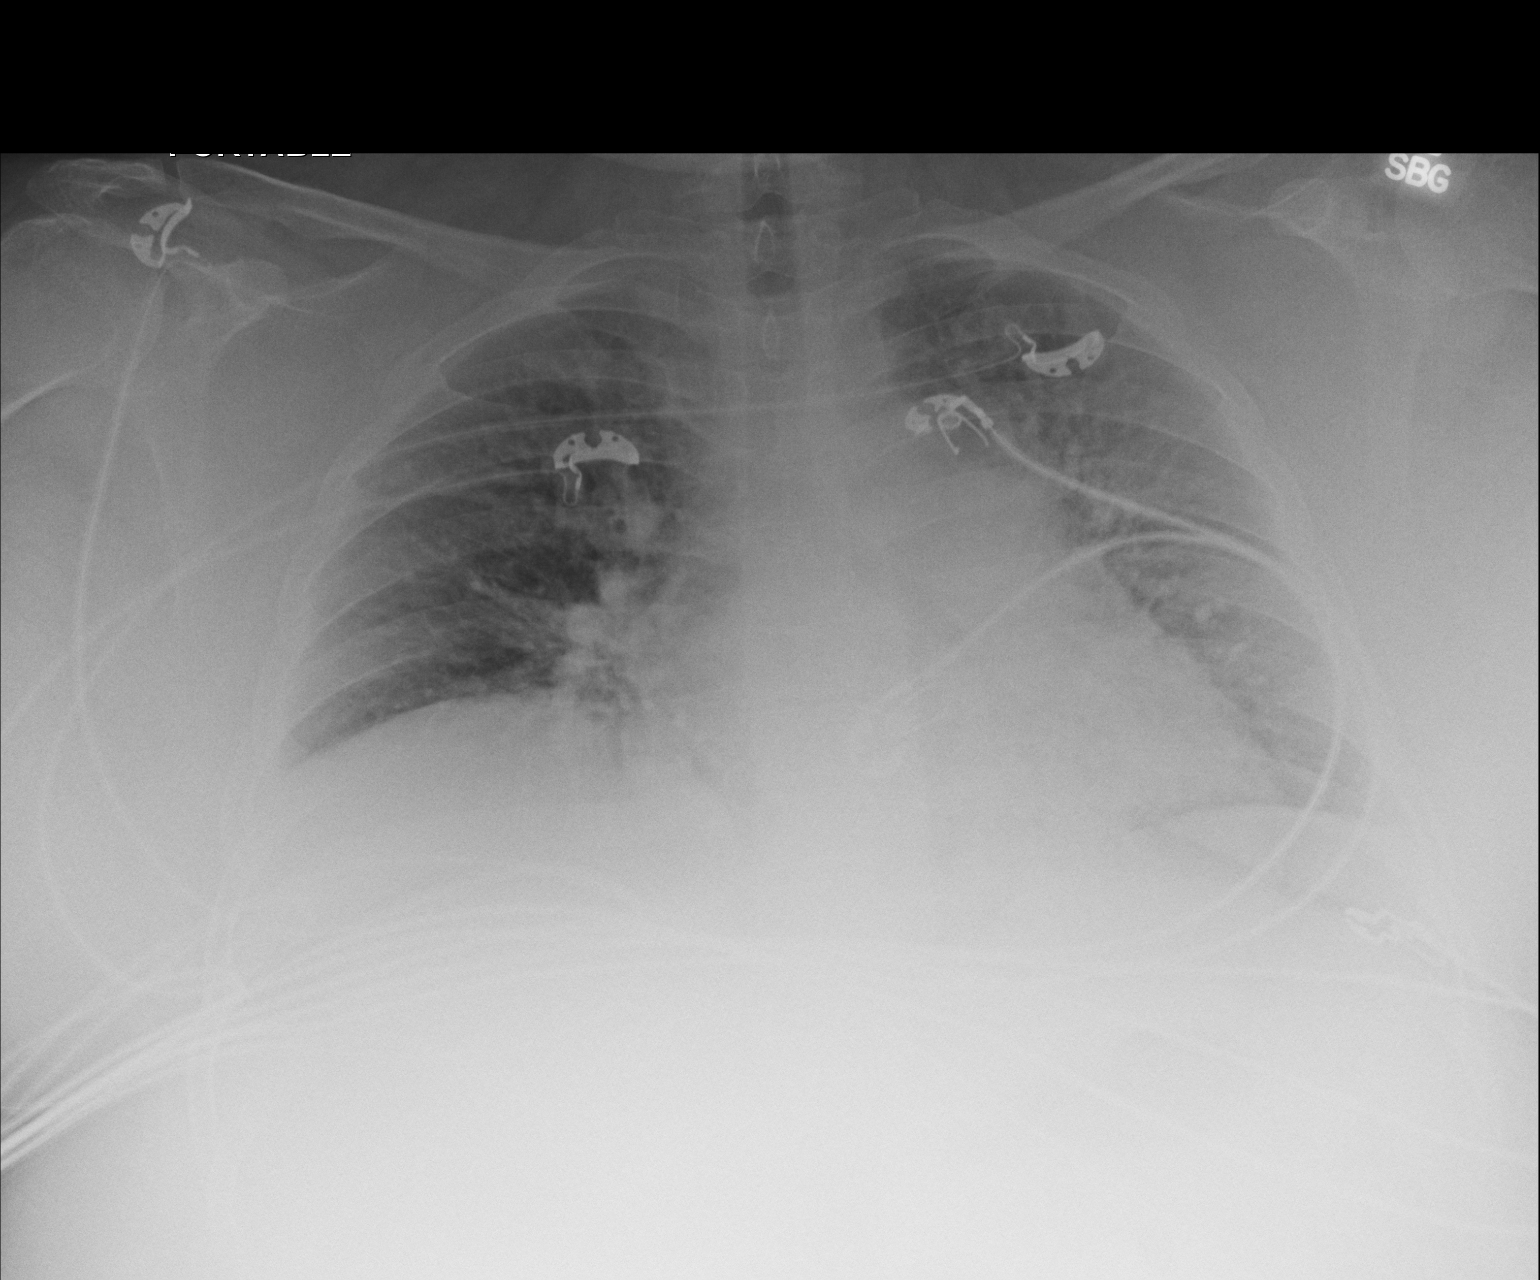

[1 of 1 positions shown; findings below may reference images not displayed]

FINDINGS: Low lung volumes accentuates the interstitium.

Increasing interstitial opacities of the left lung.

Apical lordotic positioning.

Cardiomediastinal silhouette unchanged.

No large pleural effusion or pneumothorax.

No displaced fracture.
IMPRESSION: Low lung volumes, with increasing opacities on the left concerning
for developing infection given the patient history.

## 2016-07-03 ENCOUNTER — Telehealth: Payer: Self-pay

## 2016-07-03 NOTE — Telephone Encounter (Signed)
Opened in error

## 2016-09-18 DIAGNOSIS — T8332XA Displacement of intrauterine contraceptive device, initial encounter: Secondary | ICD-10-CM | POA: Insufficient documentation

## 2016-11-08 ENCOUNTER — Encounter: Payer: Self-pay | Admitting: Family Medicine

## 2021-08-25 DIAGNOSIS — M7989 Other specified soft tissue disorders: Secondary | ICD-10-CM | POA: Insufficient documentation

## 2021-08-26 DIAGNOSIS — I2721 Secondary pulmonary arterial hypertension: Secondary | ICD-10-CM | POA: Insufficient documentation

## 2021-08-26 DIAGNOSIS — Z7409 Other reduced mobility: Secondary | ICD-10-CM | POA: Insufficient documentation

## 2021-11-10 DIAGNOSIS — Z930 Tracheostomy status: Secondary | ICD-10-CM | POA: Insufficient documentation

## 2021-11-11 DIAGNOSIS — S31109A Unspecified open wound of abdominal wall, unspecified quadrant without penetration into peritoneal cavity, initial encounter: Secondary | ICD-10-CM | POA: Insufficient documentation

## 2021-11-11 DIAGNOSIS — J9809 Other diseases of bronchus, not elsewhere classified: Secondary | ICD-10-CM | POA: Insufficient documentation

## 2022-03-05 DIAGNOSIS — A419 Sepsis, unspecified organism: Secondary | ICD-10-CM | POA: Insufficient documentation

## 2022-03-16 DIAGNOSIS — Z9181 History of falling: Secondary | ICD-10-CM | POA: Insufficient documentation

## 2022-11-29 DIAGNOSIS — R600 Localized edema: Secondary | ICD-10-CM | POA: Insufficient documentation

## 2023-03-29 DIAGNOSIS — E611 Iron deficiency: Secondary | ICD-10-CM | POA: Insufficient documentation

## 2023-03-30 DIAGNOSIS — G932 Benign intracranial hypertension: Secondary | ICD-10-CM | POA: Insufficient documentation

## 2023-03-31 DIAGNOSIS — H93A9 Pulsatile tinnitus, unspecified ear: Secondary | ICD-10-CM | POA: Insufficient documentation

## 2023-03-31 DIAGNOSIS — R0689 Other abnormalities of breathing: Secondary | ICD-10-CM | POA: Insufficient documentation

## 2023-03-31 DIAGNOSIS — E559 Vitamin D deficiency, unspecified: Secondary | ICD-10-CM | POA: Insufficient documentation

## 2023-04-02 DIAGNOSIS — Z9911 Dependence on respirator [ventilator] status: Secondary | ICD-10-CM | POA: Insufficient documentation

## 2023-04-02 DIAGNOSIS — E876 Hypokalemia: Secondary | ICD-10-CM | POA: Insufficient documentation

## 2023-04-02 DIAGNOSIS — R0989 Other specified symptoms and signs involving the circulatory and respiratory systems: Secondary | ICD-10-CM | POA: Insufficient documentation

## 2023-04-05 DIAGNOSIS — K219 Gastro-esophageal reflux disease without esophagitis: Secondary | ICD-10-CM | POA: Insufficient documentation

## 2023-04-05 DIAGNOSIS — R4182 Altered mental status, unspecified: Secondary | ICD-10-CM | POA: Insufficient documentation

## 2023-04-06 DIAGNOSIS — R531 Weakness: Secondary | ICD-10-CM | POA: Insufficient documentation

## 2023-04-06 DIAGNOSIS — I110 Hypertensive heart disease with heart failure: Secondary | ICD-10-CM | POA: Insufficient documentation

## 2023-04-07 DIAGNOSIS — J90 Pleural effusion, not elsewhere classified: Secondary | ICD-10-CM | POA: Insufficient documentation

## 2023-04-07 DIAGNOSIS — K439 Ventral hernia without obstruction or gangrene: Secondary | ICD-10-CM | POA: Insufficient documentation

## 2023-04-07 DIAGNOSIS — R918 Other nonspecific abnormal finding of lung field: Secondary | ICD-10-CM | POA: Insufficient documentation

## 2023-04-11 DIAGNOSIS — J9811 Atelectasis: Secondary | ICD-10-CM | POA: Insufficient documentation

## 2023-04-22 DIAGNOSIS — R5381 Other malaise: Secondary | ICD-10-CM | POA: Insufficient documentation

## 2023-04-23 DIAGNOSIS — Z550 Illiteracy and low-level literacy: Secondary | ICD-10-CM | POA: Insufficient documentation

## 2023-04-23 DIAGNOSIS — Z789 Other specified health status: Secondary | ICD-10-CM | POA: Insufficient documentation

## 2023-08-23 ENCOUNTER — Encounter (HOSPITAL_BASED_OUTPATIENT_CLINIC_OR_DEPARTMENT_OTHER): Payer: Self-pay | Admitting: Family Medicine

## 2023-08-23 ENCOUNTER — Ambulatory Visit (HOSPITAL_BASED_OUTPATIENT_CLINIC_OR_DEPARTMENT_OTHER): Payer: Self-pay | Admitting: Family Medicine

## 2023-08-23 ENCOUNTER — Other Ambulatory Visit (HOSPITAL_BASED_OUTPATIENT_CLINIC_OR_DEPARTMENT_OTHER): Payer: Self-pay

## 2023-08-23 VITALS — BP 124/82 | HR 72 | Ht 61.5 in | Wt 319.7 lb

## 2023-08-23 DIAGNOSIS — J8410 Pulmonary fibrosis, unspecified: Secondary | ICD-10-CM

## 2023-08-23 DIAGNOSIS — M351 Other overlap syndromes: Secondary | ICD-10-CM

## 2023-08-23 DIAGNOSIS — R051 Acute cough: Secondary | ICD-10-CM

## 2023-08-23 DIAGNOSIS — M069 Rheumatoid arthritis, unspecified: Secondary | ICD-10-CM

## 2023-08-23 DIAGNOSIS — M329 Systemic lupus erythematosus, unspecified: Secondary | ICD-10-CM

## 2023-08-23 DIAGNOSIS — L409 Psoriasis, unspecified: Secondary | ICD-10-CM

## 2023-08-23 DIAGNOSIS — G4733 Obstructive sleep apnea (adult) (pediatric): Secondary | ICD-10-CM

## 2023-08-23 LAB — POCT INFLUENZA A/B
Influenza A, POC: NEGATIVE
Influenza B, POC: NEGATIVE

## 2023-08-23 LAB — POC COVID19 BINAXNOW: SARS Coronavirus 2 Ag: NEGATIVE

## 2023-08-23 MED ORDER — FUROSEMIDE 40 MG PO TABS
40.0000 mg | ORAL_TABLET | Freq: Two times a day (BID) | ORAL | 1 refills | Status: DC
  Filled 2023-08-23: qty 60, 30d supply, fill #0

## 2023-08-23 MED ORDER — CLOBETASOL PROPIONATE 0.05 % EX CREA
1.0000 | TOPICAL_CREAM | Freq: Two times a day (BID) | CUTANEOUS | 2 refills | Status: DC
  Filled 2023-08-23: qty 30, 60d supply, fill #0

## 2023-08-23 MED ORDER — POTASSIUM CHLORIDE ER 10 MEQ PO CPCR
40.0000 meq | ORAL_CAPSULE | Freq: Every day | ORAL | 1 refills | Status: DC
  Filled 2023-08-23: qty 100, 25d supply, fill #0

## 2023-08-23 MED ORDER — FAMOTIDINE 20 MG PO TABS
20.0000 mg | ORAL_TABLET | Freq: Two times a day (BID) | ORAL | 1 refills | Status: DC
  Filled 2023-08-23: qty 180, 90d supply, fill #0

## 2023-08-23 MED ORDER — SPIRONOLACTONE 25 MG PO TABS
25.0000 mg | ORAL_TABLET | Freq: Every day | ORAL | 1 refills | Status: DC
  Filled 2023-08-23: qty 90, 90d supply, fill #0
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
## 2023-08-23 NOTE — Assessment & Plan Note (Signed)
 Noted on history, recommend that she establish with rheumatologist locally for continued management

## 2023-08-23 NOTE — Assessment & Plan Note (Addendum)
 Acute onset, suspect related to viral illness.  Swab for flu and COVID are both negative in office today. Recommend proceeding with conservative measures, discussed considerations.  Patient is in no acute distress today. Discussed precautions related to acute viral illness and to be mindful of any shortness of breath or worsening of symptoms.  If this is noted, recommend presenting to emergency department for further evaluation.  Additionally, if noticing need for increased oxygen, would present to ED in this situation as well

## 2023-08-23 NOTE — Assessment & Plan Note (Signed)
 Did provide refill of her requested topical steroid cream.  Discussed precautions related to this. Recommend that she establish with rheumatologist locally for continued monitoring and management

## 2023-08-23 NOTE — Assessment & Plan Note (Signed)
 Noted on history, recommend that she establish with sleep medicine specialist/pulmonologist for continued monitoring and management.  Referral placed today

## 2023-08-23 NOTE — Patient Instructions (Signed)
  Medication Instructions:  Your physician recommends that you continue on your current medications as directed. Please refer to the Current Medication list given to you today. --If you need a refill on any your medications before your next appointment, please call your pharmacy first. If no refills are authorized on file call the office.-- Lab Work: Your physician has recommended that you have lab work today: 1 week before  If you have labs (blood work) drawn today and your tests are completely normal, you will receive your results via MyChart message OR a phone call from our staff.  Please ensure you check your voicemail in the event that you authorized detailed messages to be left on a delegated number. If you have any lab test that is abnormal or we need to change your treatment, we will call you to review the results.    Follow-Up: Your next appointment:   Your physician recommends that you schedule a follow-up appointment in: 6 weeks follow up  with Dr. de Peru  You will receive a text message or e-mail with a link to a survey about your care and experience with [place] today! We would greatly appreciate your feedback!   Thanks for letting us be apart of your health journey!!  Primary Care and Sports Medicine   Dr. Ceasar Mons Peru   We encourage you to activate your patient portal called "MyChart".  Sign up information is provided on this After Visit Summary.  MyChart is used to connect with patients for Virtual Visits (Telemedicine).  Patients are able to view lab/test results, encounter notes, upcoming appointments, etc.  Non-urgent messages can be sent to your provider as well. To learn more about what you can do with MyChart, please visit --  [email or web address].

## 2023-08-23 NOTE — Assessment & Plan Note (Signed)
 Noted on history.  Currently requiring oxygen therapy, however patient is uncertain as to underlying cause for requiring oxygen.  Discussed importance of establishing with pulmonologist locally, patient amenable, referral placed today

## 2023-10-09 ENCOUNTER — Ambulatory Visit (HOSPITAL_BASED_OUTPATIENT_CLINIC_OR_DEPARTMENT_OTHER): Payer: Self-pay | Admitting: Family Medicine

## 2023-11-27 ENCOUNTER — Ambulatory Visit (HOSPITAL_BASED_OUTPATIENT_CLINIC_OR_DEPARTMENT_OTHER): Payer: Self-pay | Admitting: Pulmonary Disease

## 2023-12-22 ENCOUNTER — Other Ambulatory Visit: Payer: Self-pay

## 2023-12-22 ENCOUNTER — Emergency Department (HOSPITAL_COMMUNITY): Payer: Self-pay

## 2023-12-22 ENCOUNTER — Inpatient Hospital Stay (HOSPITAL_COMMUNITY)
Admission: EM | Admit: 2023-12-22 | Discharge: 2023-12-25 | DRG: 189 | Disposition: A | Discharge status: Home / Self Care | Payer: Self-pay | Attending: Internal Medicine | Admitting: Internal Medicine

## 2023-12-22 ENCOUNTER — Encounter (HOSPITAL_COMMUNITY): Payer: Self-pay

## 2023-12-22 DIAGNOSIS — M069 Rheumatoid arthritis, unspecified: Secondary | ICD-10-CM | POA: Diagnosis present

## 2023-12-22 DIAGNOSIS — I5032 Chronic diastolic (congestive) heart failure: Secondary | ICD-10-CM | POA: Diagnosis present

## 2023-12-22 DIAGNOSIS — I132 Hypertensive heart and chronic kidney disease with heart failure and with stage 5 chronic kidney disease, or end stage renal disease: Secondary | ICD-10-CM | POA: Diagnosis present

## 2023-12-22 DIAGNOSIS — L4050 Arthropathic psoriasis, unspecified: Secondary | ICD-10-CM | POA: Diagnosis present

## 2023-12-22 DIAGNOSIS — Z8632 Personal history of gestational diabetes: Secondary | ICD-10-CM

## 2023-12-22 DIAGNOSIS — Z7952 Long term (current) use of systemic steroids: Secondary | ICD-10-CM

## 2023-12-22 DIAGNOSIS — J9622 Acute and chronic respiratory failure with hypercapnia: Secondary | ICD-10-CM | POA: Diagnosis present

## 2023-12-22 DIAGNOSIS — G4733 Obstructive sleep apnea (adult) (pediatric): Secondary | ICD-10-CM | POA: Diagnosis present

## 2023-12-22 DIAGNOSIS — J9621 Acute and chronic respiratory failure with hypoxia: Principal | ICD-10-CM | POA: Diagnosis present

## 2023-12-22 DIAGNOSIS — Z833 Family history of diabetes mellitus: Secondary | ICD-10-CM

## 2023-12-22 DIAGNOSIS — Z9981 Dependence on supplemental oxygen: Secondary | ICD-10-CM

## 2023-12-22 DIAGNOSIS — Z1152 Encounter for screening for COVID-19: Secondary | ICD-10-CM

## 2023-12-22 DIAGNOSIS — M351 Other overlap syndromes: Secondary | ICD-10-CM

## 2023-12-22 DIAGNOSIS — R9431 Abnormal electrocardiogram [ECG] [EKG]: Secondary | ICD-10-CM | POA: Diagnosis present

## 2023-12-22 DIAGNOSIS — J9691 Respiratory failure, unspecified with hypoxia: Secondary | ICD-10-CM | POA: Diagnosis present

## 2023-12-22 DIAGNOSIS — Z86711 Personal history of pulmonary embolism: Secondary | ICD-10-CM

## 2023-12-22 DIAGNOSIS — Z8249 Family history of ischemic heart disease and other diseases of the circulatory system: Secondary | ICD-10-CM

## 2023-12-22 DIAGNOSIS — J45909 Unspecified asthma, uncomplicated: Secondary | ICD-10-CM | POA: Diagnosis present

## 2023-12-22 DIAGNOSIS — M329 Systemic lupus erythematosus, unspecified: Secondary | ICD-10-CM | POA: Diagnosis present

## 2023-12-22 DIAGNOSIS — Z79899 Other long term (current) drug therapy: Secondary | ICD-10-CM

## 2023-12-22 DIAGNOSIS — Z6841 Body Mass Index (BMI) 40.0 and over, adult: Secondary | ICD-10-CM

## 2023-12-22 DIAGNOSIS — J849 Interstitial pulmonary disease, unspecified: Secondary | ICD-10-CM | POA: Diagnosis present

## 2023-12-22 DIAGNOSIS — J8410 Pulmonary fibrosis, unspecified: Secondary | ICD-10-CM | POA: Diagnosis present

## 2023-12-22 DIAGNOSIS — N186 End stage renal disease: Secondary | ICD-10-CM | POA: Diagnosis present

## 2023-12-22 DIAGNOSIS — J9601 Acute respiratory failure with hypoxia: Principal | ICD-10-CM

## 2023-12-22 LAB — CBC WITH DIFFERENTIAL/PLATELET
Abs Immature Granulocytes: 0.04 10*3/uL (ref 0.00–0.07)
Basophils Absolute: 0.1 10*3/uL (ref 0.0–0.1)
Basophils Relative: 1 %
Eosinophils Absolute: 0.1 10*3/uL (ref 0.0–0.5)
Eosinophils Relative: 1 %
HCT: 40.9 % (ref 36.0–46.0)
Hemoglobin: 11.6 g/dL — ABNORMAL LOW (ref 12.0–15.0)
Immature Granulocytes: 0 %
Lymphocytes Relative: 26 %
Lymphs Abs: 2.7 10*3/uL (ref 0.7–4.0)
MCH: 22.7 pg — ABNORMAL LOW (ref 26.0–34.0)
MCHC: 28.4 g/dL — ABNORMAL LOW (ref 30.0–36.0)
MCV: 80.2 fL (ref 80.0–100.0)
Monocytes Absolute: 0.7 10*3/uL (ref 0.1–1.0)
Monocytes Relative: 7 %
Neutro Abs: 6.9 10*3/uL (ref 1.7–7.7)
Neutrophils Relative %: 65 %
Platelets: 293 10*3/uL (ref 150–400)
RBC: 5.1 MIL/uL (ref 3.87–5.11)
RDW: 17.1 % — ABNORMAL HIGH (ref 11.5–15.5)
WBC: 10.4 10*3/uL (ref 4.0–10.5)
nRBC: 0 % (ref 0.0–0.2)

## 2023-12-22 LAB — RESP PANEL BY RT-PCR (RSV, FLU A&B, COVID)  RVPGX2
Influenza A by PCR: NEGATIVE
Influenza B by PCR: NEGATIVE
Resp Syncytial Virus by PCR: NEGATIVE
SARS Coronavirus 2 by RT PCR: NEGATIVE

## 2023-12-22 LAB — COMPREHENSIVE METABOLIC PANEL WITH GFR
ALT: 31 U/L (ref 0–44)
AST: 44 U/L — ABNORMAL HIGH (ref 15–41)
Albumin: 3.6 g/dL (ref 3.5–5.0)
Alkaline Phosphatase: 96 U/L (ref 38–126)
Anion gap: 8 (ref 5–15)
BUN: 16 mg/dL (ref 6–20)
CO2: 28 mmol/L (ref 22–32)
Calcium: 9.4 mg/dL (ref 8.9–10.3)
Chloride: 106 mmol/L (ref 98–111)
Creatinine, Ser: 0.7 mg/dL (ref 0.44–1.00)
GFR, Estimated: >60 mL/min (ref >60)
Glucose, Bld: 118 mg/dL — ABNORMAL HIGH (ref 70–99)
Potassium: 3.6 mmol/L (ref 3.5–5.1)
Sodium: 142 mmol/L (ref 135–145)
Total Bilirubin: 0.4 mg/dL (ref 0.0–1.2)
Total Protein: 8 g/dL (ref 6.5–8.1)

## 2023-12-22 LAB — HCG, SERUM, QUALITATIVE: Preg, Serum: NEGATIVE

## 2023-12-22 MED ORDER — MAGNESIUM SULFATE 2 GM/50ML IV SOLN
2.0000 g | Freq: Once | INTRAVENOUS | Status: AC
  Administered 2023-12-23: 2 g via INTRAVENOUS
  Filled 2023-12-22: qty 50

## 2023-12-22 MED ORDER — FENTANYL CITRATE PF 50 MCG/ML IJ SOSY
50.0000 ug | PREFILLED_SYRINGE | Freq: Once | INTRAMUSCULAR | Status: AC
  Administered 2023-12-22: 50 ug via INTRAVENOUS
  Filled 2023-12-22: qty 1

## 2023-12-22 MED ORDER — IPRATROPIUM BROMIDE 0.02 % IN SOLN
0.5000 mg | Freq: Once | RESPIRATORY_TRACT | Status: AC
  Administered 2023-12-22: 0.5 mg via RESPIRATORY_TRACT
  Filled 2023-12-22: qty 2.5

## 2023-12-22 MED ORDER — METHYLPREDNISOLONE SODIUM SUCC 125 MG IJ SOLR
125.0000 mg | Freq: Once | INTRAMUSCULAR | Status: AC
  Administered 2023-12-22: 125 mg via INTRAVENOUS
  Filled 2023-12-22: qty 2

## 2023-12-22 MED ORDER — ALBUTEROL SULFATE (2.5 MG/3ML) 0.083% IN NEBU
5.0000 mg | INHALATION_SOLUTION | Freq: Once | RESPIRATORY_TRACT | Status: AC
  Administered 2023-12-22: 5 mg via RESPIRATORY_TRACT
  Filled 2023-12-22: qty 6

## 2023-12-22 NOTE — ED Notes (Addendum)
 Error

## 2023-12-22 NOTE — ED Triage Notes (Signed)
 Patient bib EMS from home with worsening shortness of breath. Her breathing today is preventing her from her from being as mobile as she normally is. Also patient reports that she has pain in between the shoulder blades when she takes a deep breath. EMS reports she is restless, unable to get into a comfortable position, she has wheezing in the upper lobes and they were unable to hear air movement in the lower lobes.  HX of cirrhosis, abdomen tumor and asthma. She is on 4L chronically at home.

## 2023-12-22 NOTE — ED Notes (Signed)
 Patient states she uses a BIPAP at night to sleep.

## 2023-12-22 NOTE — ED Provider Notes (Addendum)
 [place] EMERGENCY DEPARTMENT AT [facility] Provider Note   CSN: [id number] Arrival date & time: 12/22/23  1931     Patient presents with: Respiratory Distress   Donna Hall is a 38 y.o. female.   HPI Patient with shortness of breath.  Has had cough for some sputum production for the last 4 days.  No fevers.  Pain in her mid back.  History of interstitial lung disease.  Had been on steroids up until a month ago.  No longer on steroids.  No fevers.  Patient is on chronic oxygen .   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   2008: trivial 09/2007: moderate to large, improved on f/u ECHO    Gestational diabetes 2006   Headache    just when I have the pain from aching bones and psorasis and/or lupus symptoms (05/15/2104)   History of blood transfusion    because white cells ate the red cells   Hypertension 2010   Interstitial lung disease (HCC)    thelbert 05/15/2014   Lupus 2000   Lupus (systemic lupus erythematosus) (HCC) 2010   Obesity    On home oxygen  therapy    2L; 24/7 (05/15/2014)   Pneumonia several times   Psoriasis 2010   as a child   Pulmonary embolism (HCC)    hx/notes 05/15/2014   Rheumatoid arthritis(714.0) 2010   Sleep apnea     Prior to Admission medications   Medication Sig Start Date End Date Taking? Authorizing Provider  budesonide -formoterol  (SYMBICORT ) 80-4.5 MCG/ACT inhaler Inhale 2 puffs into the lungs 2 (two) times daily. 12/25/23  Yes Regalado, Belkys A, MD  acetaminophen  (TYLENOL ) 500 MG tablet Take 2 tablets (1,000 mg total) by mouth every 6 (six) hours as needed. 01/09/24   de [place], Raymond J, MD  albuterol  (VENTOLIN  HFA) 108 [phone] Base) MCG/ACT inhaler Inhale 2 puffs into the lungs every 4 (four) hours as needed for wheezing or shortness of breath. 12/25/23   Regalado, Belkys A, MD  clobetasol  cream  (TEMOVATE ) 0.05 % Apply 1 Application topically 2 (two) times daily. 01/09/24   de [place], Quintin PARAS, MD  docusate sodium  (COLACE) 100 MG capsule Take 1 capsule (100 mg total) by mouth 2 (two) times daily as needed for mild constipation. 01/09/24   Meade Verdon RAMAN, MD  doxycycline  (ADOXA) 100 MG tablet Take 100 mg by mouth 2 (two) times daily.    [provider]  famotidine  (PEPCID ) 20 MG tablet Take 1 tablet (20 mg total) by mouth 2 (two) times daily. 12/25/23   Regalado, Belkys A, MD  furosemide  (LASIX ) 40 MG tablet Take 1 tablet (40 mg total) by mouth 2 (two) times daily. 12/25/23   Regalado, Belkys A, MD  guaiFENesin  (MUCINEX ) 600 MG 12 hr tablet Take 1 tablet (600 mg total) by mouth 2 (two) times daily. 12/25/23   Regalado, Belkys A, MD  ipratropium-albuterol  (DUONEB) 0.5-2.5 (3) MG/3ML SOLN Take 3 mLs by nebulization 3 (three) times daily. 12/25/23   Regalado, Belkys A, MD  polyethylene glycol powder (GLYCOLAX /MIRALAX ) 17 GM/SCOOP powder Take 17 g by mouth daily. 01/09/24   Desai, Nikita S, MD  potassium chloride  (KLOR-CON  M) 10 MEQ tablet Take 2 tablets (20 mEq total) by mouth daily. 12/25/23   Regalado, Belkys A, MD  predniSONE  (DELTASONE ) 20 MG tablet Take 20 mg by mouth  daily with breakfast.    [provider]  spironolactone  (ALDACTONE ) 25 MG tablet Take 1 tablet (25 mg total) by mouth daily. 12/25/23   Regalado, Owen LABOR, MD    Allergies: Patient has no known allergies.    Review of Systems  Updated Vital Signs BP 100/60 (BP Location: Left Arm)   Pulse 91   Temp 98.9 F (37.2 C) (Oral)   Resp 16   Ht 5' 1 (1.549 m)   Wt (!) 145 kg   SpO2 100%   BMI 60.40 kg/m   Physical Exam Vitals and nursing note reviewed.  Constitutional:      Appearance: She is obese.  HENT:     Head: Atraumatic.  Cardiovascular:     Rate and Rhythm: Tachycardia present.  Pulmonary:     Breath sounds: Wheezing present.     Comments: Diffuse wheezes and prolonged expirations. Abdominal:      Tenderness: There is no abdominal tenderness.  Musculoskeletal:     Right lower leg: Edema present.     Left lower leg: Edema present.  Skin:    General: Skin is warm.  Neurological:     Mental Status: She is oriented to person, place, and time.     (all labs ordered are listed, but only abnormal results are displayed) Labs Reviewed  COMPREHENSIVE METABOLIC PANEL WITH GFR - Abnormal; Notable for the following components:      Result Value   Glucose, Bld 118 (*)    AST 44 (*)    All other components within normal limits  CBC WITH DIFFERENTIAL/PLATELET - Abnormal; Notable for the following components:   Hemoglobin 11.6 (*)    MCH 22.7 (*)    MCHC 28.4 (*)    RDW 17.1 (*)    All other components within normal limits  BASIC METABOLIC PANEL WITH GFR - Abnormal; Notable for the following components:   Glucose, Bld 206 (*)    All other components within normal limits  CBC - Abnormal; Notable for the following components:   Hemoglobin 11.1 (*)    MCV 79.8 (*)    MCH 22.7 (*)    MCHC 28.4 (*)    RDW 16.9 (*)    All other components within normal limits  MAGNESIUM  - Abnormal; Notable for the following components:   Magnesium  2.5 (*)    All other components within normal limits  GLUCOSE, CAPILLARY - Abnormal; Notable for the following components:   Glucose-Capillary 218 (*)    All other components within normal limits  GLUCOSE, CAPILLARY - Abnormal; Notable for the following components:   Glucose-Capillary 196 (*)    All other components within normal limits  BASIC METABOLIC PANEL WITH GFR - Abnormal; Notable for the following components:   Glucose, Bld 162 (*)    Calcium 8.6 (*)    All other components within normal limits  CBC - Abnormal; Notable for the following components:   WBC 10.8 (*)    Hemoglobin 10.5 (*)    MCH 23.2 (*)    MCHC 28.8 (*)    RDW 16.4 (*)    All other components within normal limits  GLUCOSE, CAPILLARY - Abnormal; Notable for the following  components:   Glucose-Capillary 130 (*)    All other components within normal limits  GLUCOSE, CAPILLARY - Abnormal; Notable for the following components:   Glucose-Capillary 167 (*)    All other components within normal limits  GLUCOSE, CAPILLARY - Abnormal; Notable for the following components:   Glucose-Capillary  172 (*)    All other components within normal limits  GLUCOSE, CAPILLARY - Abnormal; Notable for the following components:   Glucose-Capillary 135 (*)    All other components within normal limits  BASIC METABOLIC PANEL WITH GFR - Abnormal; Notable for the following components:   Chloride 95 (*)    CO2 36 (*)    Glucose, Bld 105 (*)    Calcium 8.3 (*)    All other components within normal limits  CBC - Abnormal; Notable for the following components:   Hemoglobin 10.4 (*)    MCH 22.6 (*)    MCHC 28.2 (*)    RDW 16.2 (*)    All other components within normal limits  GLUCOSE, CAPILLARY - Abnormal; Notable for the following components:   Glucose-Capillary 131 (*)    All other components within normal limits  GLUCOSE, CAPILLARY - Abnormal; Notable for the following components:   Glucose-Capillary 107 (*)    All other components within normal limits  GLUCOSE, CAPILLARY - Abnormal; Notable for the following components:   Glucose-Capillary 116 (*)    All other components within normal limits  RESP PANEL BY RT-PCR (RSV, FLU A&B, COVID)  RVPGX2  RESPIRATORY PANEL BY PCR  MRSA NEXT GEN BY PCR, NASAL  HCG, SERUM, QUALITATIVE  HIV ANTIBODY (ROUTINE TESTING W REFLEX)  PROCALCITONIN  BRAIN NATRIURETIC PEPTIDE  HEMOGLOBIN A1C    EKG: EKG Interpretation Date/Time:  Saturday December 22 2023 20:17:49 EDT Ventricular Rate:  121 PR Interval:  143 QRS Duration:  95 QT Interval:  363 QTC Calculation: 515 R Axis:   108  Text Interpretation: Sinus tachycardia Consider right ventricular hypertrophy Borderline T abnormalities, diffuse leads Prolonged QT interval Confirmed by  Patsey Lot [phone]) on 12/22/2023 10:02:40 PM  Radiology: No results found.    Procedures   Medications Ordered in the ED  methylPREDNISolone  sodium succinate (SOLU-MEDROL ) 125 mg/2 mL injection 125 mg (125 mg Intravenous Given 12/22/23 2014)  albuterol  (PROVENTIL ) (2.5 MG/3ML) 0.083% nebulizer solution 5 mg (5 mg Nebulization Given 12/22/23 2013)  ipratropium (ATROVENT ) nebulizer solution 0.5 mg (0.5 mg Nebulization Given 12/22/23 2013)  fentaNYL  (SUBLIMAZE ) injection 50 mcg (50 mcg Intravenous Given 12/22/23 2330)  magnesium  sulfate IVPB 2 g 50 mL (0 g Intravenous Stopped 12/23/23 0254)  methylPREDNISolone  sodium succinate (SOLU-MEDROL ) 125 mg/2 mL injection 125 mg (125 mg Intravenous Given 12/23/23 2147)  potassium chloride  SA (KLOR-CON  M) CR tablet 20 mEq (20 mEq Oral Given 12/23/23 0240)  iohexol  (OMNIPAQUE ) 350 MG/ML injection 75 mL (75 mLs Intravenous Contrast Given 12/23/23 0634)  furosemide  (LASIX ) injection 40 mg (40 mg Intravenous Given 12/24/23 1541)                                    Medical Decision Making Amount and/or Complexity of Data Reviewed Labs: ordered. Radiology: ordered.  Risk Prescription drug management. Decision regarding hospitalization.   Patient shortness of breath.  Differential diagnosis includes interstitial lung disease, pneumonia, viral syndrome.  Also potentially CHF.  Will get x-ray and basic blood work.  Will give breathing treatment and steroids.  I reviewed previous discharge note.  Less wheezing on exam on recheck.  However still dyspneic.  Now with mild headache.  X-ray shows potential edema versus atypical infection.  Think patient benefit from mission the hospital for further workup.  Will discuss with hospitalist.  CRITICAL CARE Performed by: Lot Patsey Total critical care time: 30 minutes  Critical care time was exclusive of separately billable procedures and treating other patients. Critical care was necessary to treat  or prevent imminent or life-threatening deterioration. Critical care was time spent personally by me on the following activities: development of treatment plan with patient and/or surrogate as well as nursing, discussions with consultants, evaluation of patient's response to treatment, examination of patient, obtaining history from patient or surrogate, ordering and performing treatments and interventions, ordering and review of laboratory studies, ordering and review of radiographic studies, pulse oximetry and re-evaluation of patient's condition.     Final diagnoses:  Acute respiratory failure with hypoxia (HCC)  Interstitial lung disease [facility])    ED Discharge Orders          Ordered    potassium chloride  (KLOR-CON  M) 10 MEQ tablet  Daily        12/25/23 0905    albuterol  (VENTOLIN  HFA) 108 (90 Base) MCG/ACT inhaler  Every 4 hours PRN        12/25/23 0905    clobetasol  cream (TEMOVATE ) 0.05 %  2 times daily,   Status:  Discontinued        12/25/23 0905    famotidine  (PEPCID ) 20 MG tablet  2 times daily        12/25/23 0905    furosemide  (LASIX ) 40 MG tablet  2 times daily        12/25/23 0905    predniSONE  (DELTASONE ) 20 MG tablet  Daily        12/25/23 0905    spironolactone  (ALDACTONE ) 25 MG tablet  Daily        12/25/23 0905    doxycycline  (VIBRA -TABS) 100 MG tablet  Every 12 hours        12/25/23 0905    guaiFENesin  (MUCINEX ) 600 MG 12 hr tablet  2 times daily        12/25/23 0905    ipratropium-albuterol  (DUONEB) 0.5-2.5 (3) MG/3ML SOLN  3 times daily        12/25/23 0905    budesonide -formoterol  (SYMBICORT ) 80-4.5 MCG/ACT inhaler  2 times daily        12/25/23 0905    Increase activity slowly        12/25/23 0905    Diet - low sodium heart healthy        12/25/23 0905    polyethylene glycol powder (GLYCOLAX /MIRALAX ) 17 GM/SCOOP powder  Daily,   Status:  Discontinued        12/25/23 0913               Patsey Lot, MD 12/22/23 2308    Patsey Lot, MD 01/11/24 [phone]

## 2023-12-23 ENCOUNTER — Inpatient Hospital Stay (HOSPITAL_COMMUNITY): Payer: Self-pay

## 2023-12-23 ENCOUNTER — Encounter (HOSPITAL_COMMUNITY): Payer: Self-pay | Admitting: Family Medicine

## 2023-12-23 DIAGNOSIS — J9692 Respiratory failure, unspecified with hypercapnia: Secondary | ICD-10-CM

## 2023-12-23 DIAGNOSIS — M069 Rheumatoid arthritis, unspecified: Secondary | ICD-10-CM

## 2023-12-23 DIAGNOSIS — M329 Systemic lupus erythematosus, unspecified: Secondary | ICD-10-CM

## 2023-12-23 DIAGNOSIS — I5032 Chronic diastolic (congestive) heart failure: Secondary | ICD-10-CM | POA: Diagnosis present

## 2023-12-23 DIAGNOSIS — J9691 Respiratory failure, unspecified with hypoxia: Secondary | ICD-10-CM | POA: Diagnosis present

## 2023-12-23 DIAGNOSIS — J8410 Pulmonary fibrosis, unspecified: Secondary | ICD-10-CM

## 2023-12-23 DIAGNOSIS — R9431 Abnormal electrocardiogram [ECG] [EKG]: Secondary | ICD-10-CM | POA: Diagnosis present

## 2023-12-23 DIAGNOSIS — G4733 Obstructive sleep apnea (adult) (pediatric): Secondary | ICD-10-CM

## 2023-12-23 LAB — CBC
HCT: 39.1 % (ref 36.0–46.0)
Hemoglobin: 11.1 g/dL — ABNORMAL LOW (ref 12.0–15.0)
MCH: 22.7 pg — ABNORMAL LOW (ref 26.0–34.0)
MCHC: 28.4 g/dL — ABNORMAL LOW (ref 30.0–36.0)
MCV: 79.8 fL — ABNORMAL LOW (ref 80.0–100.0)
Platelets: 245 10*3/uL (ref 150–400)
RBC: 4.9 MIL/uL (ref 3.87–5.11)
RDW: 16.9 % — ABNORMAL HIGH (ref 11.5–15.5)
WBC: 8.9 10*3/uL (ref 4.0–10.5)
nRBC: 0 % (ref 0.0–0.2)

## 2023-12-23 LAB — BRAIN NATRIURETIC PEPTIDE: B Natriuretic Peptide: 36.5 pg/mL (ref 0.0–100.0)

## 2023-12-23 LAB — HIV ANTIBODY (ROUTINE TESTING W REFLEX): HIV Screen 4th Generation wRfx: NONREACTIVE

## 2023-12-23 LAB — RESPIRATORY PANEL BY PCR

## 2023-12-23 LAB — BASIC METABOLIC PANEL WITH GFR
Anion gap: 9 (ref 5–15)
BUN: 15 mg/dL (ref 6–20)
CO2: 27 mmol/L (ref 22–32)
Calcium: 9.2 mg/dL (ref 8.9–10.3)
Chloride: 104 mmol/L (ref 98–111)
Creatinine, Ser: 0.52 mg/dL (ref 0.44–1.00)
GFR, Estimated: >60 mL/min (ref >60)
Glucose, Bld: 206 mg/dL — ABNORMAL HIGH (ref 70–99)
Potassium: 4 mmol/L (ref 3.5–5.1)
Sodium: 140 mmol/L (ref 135–145)

## 2023-12-23 LAB — PROCALCITONIN: Procalcitonin: <0.1 ng/mL

## 2023-12-23 LAB — GLUCOSE, CAPILLARY
Glucose-Capillary: 218 mg/dL — ABNORMAL HIGH (ref 70–99)
Glucose-Capillary: 196 mg/dL — ABNORMAL HIGH (ref 70–99)
Glucose-Capillary: 130 mg/dL — ABNORMAL HIGH (ref 70–99)

## 2023-12-23 LAB — MRSA NEXT GEN BY PCR, NASAL: MRSA by PCR Next Gen: NOT DETECTED

## 2023-12-23 LAB — HEMOGLOBIN A1C
Hgb A1c MFr Bld: 5.1 % (ref 4.8–5.6)
Mean Plasma Glucose: 99.67 mg/dL

## 2023-12-23 LAB — MAGNESIUM: Magnesium: 2.5 mg/dL — ABNORMAL HIGH (ref 1.7–2.4)

## 2023-12-23 MED ORDER — SODIUM CHLORIDE 0.9 % IV SOLN
2.0000 g | INTRAVENOUS | Status: DC
  Administered 2023-12-23 – 2023-12-25 (×3): 2 g via INTRAVENOUS
  Filled 2023-12-23 (×3): qty 20

## 2023-12-23 MED ORDER — SODIUM CHLORIDE 0.9 % IV SOLN
100.0000 mg | Freq: Two times a day (BID) | INTRAVENOUS | Status: DC
  Administered 2023-12-23 – 2023-12-24 (×3): 100 mg via INTRAVENOUS
  Filled 2023-12-23 (×4): qty 100

## 2023-12-23 MED ORDER — FUROSEMIDE 40 MG PO TABS
40.0000 mg | ORAL_TABLET | Freq: Two times a day (BID) | ORAL | Status: DC
  Administered 2023-12-23 – 2023-12-24 (×3): 40 mg via ORAL
  Filled 2023-12-23 (×3): qty 1

## 2023-12-23 MED ORDER — METHYLPREDNISOLONE SODIUM SUCC 125 MG IJ SOLR
125.0000 mg | Freq: Two times a day (BID) | INTRAMUSCULAR | Status: AC
  Administered 2023-12-23 (×2): 125 mg via INTRAVENOUS
  Filled 2023-12-23 (×2): qty 2

## 2023-12-23 MED ORDER — IPRATROPIUM-ALBUTEROL 0.5-2.5 (3) MG/3ML IN SOLN
3.0000 mL | Freq: Four times a day (QID) | RESPIRATORY_TRACT | Status: DC
  Administered 2023-12-23 (×3): 3 mL via RESPIRATORY_TRACT
  Filled 2023-12-23 (×3): qty 3

## 2023-12-23 MED ORDER — OXYCODONE HCL 5 MG PO TABS
5.0000 mg | ORAL_TABLET | ORAL | Status: DC | PRN
  Administered 2023-12-23 – 2023-12-25 (×5): 5 mg via ORAL
  Filled 2023-12-23 (×5): qty 1

## 2023-12-23 MED ORDER — CLOBETASOL PROPIONATE 0.05 % EX CREA
1.0000 | TOPICAL_CREAM | Freq: Two times a day (BID) | CUTANEOUS | Status: DC
  Administered 2023-12-23 – 2023-12-25 (×4): 1 via TOPICAL
  Filled 2023-12-23 (×5): qty 15

## 2023-12-23 MED ORDER — INSULIN ASPART 100 UNIT/ML IJ SOLN: 0.0000 [IU] | Freq: Every day | INTRAMUSCULAR | Status: DC

## 2023-12-23 MED ORDER — SODIUM CHLORIDE 0.9% FLUSH
3.0000 mL | Freq: Two times a day (BID) | INTRAVENOUS | Status: DC
  Administered 2023-12-23 – 2023-12-25 (×4): 3 mL via INTRAVENOUS

## 2023-12-23 MED ORDER — IOHEXOL 350 MG/ML SOLN
75.0000 mL | Freq: Once | INTRAVENOUS | Status: AC | PRN
  Administered 2023-12-23: 75 mL via INTRAVENOUS

## 2023-12-23 MED ORDER — ACETAMINOPHEN 325 MG PO TABS
650.0000 mg | ORAL_TABLET | Freq: Four times a day (QID) | ORAL | Status: DC | PRN
  Administered 2023-12-23: 650 mg via ORAL
  Filled 2023-12-23: qty 2

## 2023-12-23 MED ORDER — INSULIN ASPART 100 UNIT/ML IJ SOLN
0.0000 [IU] | Freq: Three times a day (TID) | INTRAMUSCULAR | Status: DC
  Administered 2023-12-23: 5 [IU] via SUBCUTANEOUS
  Administered 2023-12-23 – 2023-12-24 (×4): 3 [IU] via SUBCUTANEOUS

## 2023-12-23 MED ORDER — DIPHENHYDRAMINE HCL 25 MG PO CAPS
25.0000 mg | ORAL_CAPSULE | Freq: Four times a day (QID) | ORAL | Status: DC | PRN
  Administered 2023-12-23: 25 mg via ORAL
  Filled 2023-12-23: qty 1

## 2023-12-23 MED ORDER — ENOXAPARIN SODIUM 80 MG/0.8ML IJ SOSY
70.0000 mg | PREFILLED_SYRINGE | INTRAMUSCULAR | Status: DC
  Administered 2023-12-23 – 2023-12-25 (×3): 70 mg via SUBCUTANEOUS
  Filled 2023-12-23 (×3): qty 0.7

## 2023-12-23 MED ORDER — CLOBETASOL PROPIONATE 0.05 % EX CREA: 1.0000 | TOPICAL_CREAM | Freq: Two times a day (BID) | CUTANEOUS | Status: DC

## 2023-12-23 MED ORDER — GUAIFENESIN ER 600 MG PO TB12
600.0000 mg | ORAL_TABLET | Freq: Two times a day (BID) | ORAL | Status: DC
  Administered 2023-12-23 – 2023-12-25 (×5): 600 mg via ORAL
  Filled 2023-12-23 (×5): qty 1

## 2023-12-23 MED ORDER — IPRATROPIUM-ALBUTEROL 0.5-2.5 (3) MG/3ML IN SOLN: 3.0000 mL | RESPIRATORY_TRACT | Status: DC | PRN

## 2023-12-23 MED ORDER — BUDESONIDE 0.25 MG/2ML IN SUSP
0.2500 mg | Freq: Two times a day (BID) | RESPIRATORY_TRACT | Status: DC
  Administered 2023-12-23 – 2023-12-24 (×4): 0.25 mg via RESPIRATORY_TRACT
  Filled 2023-12-23 (×6): qty 2

## 2023-12-23 MED ORDER — PREDNISONE 20 MG PO TABS
40.0000 mg | ORAL_TABLET | Freq: Every day | ORAL | Status: DC
  Administered 2023-12-24 – 2023-12-25 (×2): 40 mg via ORAL
  Filled 2023-12-23 (×3): qty 2

## 2023-12-23 MED ORDER — ACETAMINOPHEN 650 MG RE SUPP
650.0000 mg | Freq: Four times a day (QID) | RECTAL | Status: DC | PRN
Start: 2023-12-23 — End: 2023-12-25

## 2023-12-23 MED ORDER — CAMPHOR-MENTHOL 0.5-0.5 % EX LOTN
TOPICAL_LOTION | CUTANEOUS | Status: DC | PRN
  Filled 2023-12-23: qty 222

## 2023-12-23 MED ORDER — POLYETHYLENE GLYCOL 3350 17 G PO PACK
17.0000 g | PACK | Freq: Every day | ORAL | Status: DC | PRN
  Administered 2023-12-24: 17 g via ORAL
  Filled 2023-12-23: qty 1

## 2023-12-23 MED ORDER — SPIRONOLACTONE 25 MG PO TABS
25.0000 mg | ORAL_TABLET | Freq: Every day | ORAL | Status: DC
  Administered 2023-12-23 – 2023-12-25 (×3): 25 mg via ORAL
  Filled 2023-12-23 (×3): qty 1

## 2023-12-23 MED ORDER — ARFORMOTEROL TARTRATE 15 MCG/2ML IN NEBU
15.0000 ug | INHALATION_SOLUTION | Freq: Two times a day (BID) | RESPIRATORY_TRACT | Status: DC
  Administered 2023-12-23 – 2023-12-25 (×5): 15 ug via RESPIRATORY_TRACT
  Filled 2023-12-23 (×6): qty 2

## 2023-12-23 MED ORDER — TRIMETHOBENZAMIDE HCL 100 MG/ML IM SOLN: 200.0000 mg | Freq: Four times a day (QID) | INTRAMUSCULAR | Status: DC | PRN

## 2023-12-23 MED ORDER — FAMOTIDINE 20 MG PO TABS
20.0000 mg | ORAL_TABLET | Freq: Two times a day (BID) | ORAL | Status: DC
  Administered 2023-12-23 – 2023-12-25 (×5): 20 mg via ORAL
  Filled 2023-12-23 (×5): qty 1

## 2023-12-23 MED ORDER — HYDROMORPHONE HCL 1 MG/ML IJ SOLN
0.5000 mg | INTRAMUSCULAR | Status: DC | PRN
  Administered 2023-12-23 – 2023-12-24 (×8): 0.5 mg via INTRAVENOUS
  Filled 2023-12-23 (×8): qty 0.5

## 2023-12-23 MED ORDER — POTASSIUM CHLORIDE CRYS ER 20 MEQ PO TBCR
20.0000 meq | EXTENDED_RELEASE_TABLET | Freq: Once | ORAL | Status: AC
  Administered 2023-12-23: 20 meq via ORAL
  Filled 2023-12-23: qty 1

## 2023-12-23 NOTE — Plan of Care (Signed)

## 2023-12-23 NOTE — H&P (Signed)
 History and Physical    Donna Hall FMW:[id number] DOB: Feb 22, 1986 DOA: 12/22/2023  PCP: de [place], Raymond J, MD   Patient coming from: Home   Chief Complaint: Increased SOB and cough, pain between scapulae when taking deep breath   HPI: Donna Hall is a 38 y.o. female with medical history significant for SLE, psoriatic arthritis, ILD, OSA, BMI 60, chronic HFpEF, chronic hypoxic and hypercarbic respiratory failure, now presenting with worsening shortness of breath, increased cough and sputum production, and pain in her upper back with deep breath or cough.  Patient states that she ran out of her medications approximately 2 months ago.  She was experiencing increased shortness of breath and increased cough and sputum production over the past several days.  Her symptoms have worsened and yesterday she was unable to perform her usual activities and was feeling short of breath even at rest.  She denies fevers, chest pain, or worsening arthralgias but notes some worsening in her rash and perhaps slight worsening in her chronic bilateral leg swelling.  ED Course: Upon arrival to the ED, patient is found to be afebrile and saturating well on 4 L/min supplemental oxygen  with tachypnea, tachycardia, and stable BP.  Labs are most notable for normal creatinine, normal WBC, and negative influenza/COVID/RSV PCR.  Chest x-ray notable for cardiomegaly and diffuse perihilar interstitial opacities bilaterally.  She was treated with IV Solu-Medrol , IV magnesium , albuterol , Atrovent , and fentanyl  in the ED.  Review of Systems:  All other systems reviewed and apart from HPI, are negative.  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   2008: trivial 09/2007: moderate to large, improved on f/u ECHO    Gestational diabetes 2006   Headache    just when I have the pain from aching bones  and psorasis and/or lupus symptoms (05/15/2104)   History of blood transfusion    because white cells ate the red cells   Hypertension 2010   Interstitial lung disease (HCC)    thelbert 05/15/2014   Lupus 2000   Lupus (systemic lupus erythematosus) (HCC) 2010   Obesity    On home oxygen  therapy    2L; 24/7 (05/15/2014)   Pneumonia several times   Psoriasis 2010   as a child   Pulmonary embolism (HCC)    hx/notes 05/15/2014   Rheumatoid arthritis(714.0) 2010   Sleep apnea     Past Surgical History:  Procedure Laterality Date   CESAREAN SECTION  2010   INDUCED ABORTION     THIGH / KNEE SOFT TISSUE BIOPSY Right 2011   thigh    Social History:   reports that she has never smoked. She has never been exposed to tobacco smoke. She has never used smokeless tobacco. She reports that she does not drink alcohol and does not use drugs.  No Known Allergies  Family History  Problem Relation Age of Onset   Diabetes Mother    Hypertension Mother    Cancer Neg Hx    Early death Neg Hx    Heart disease Neg Hx      Prior to Admission medications   Medication Sig Start Date End Date Taking? Authorizing Provider  acetaminophen  (TYLENOL ) 500 MG tablet Take 1,000 mg by mouth every 6 (six) hours as needed.    [provider]  albuterol  (PROVENTIL  HFA;VENTOLIN  HFA) 108 (90 Base) MCG/ACT inhaler  Inhale 2 puffs into the lungs every 4 (four) hours as needed for wheezing or shortness of breath. 10/18/15   Funches, Josalyn, MD  clobetasol  cream (TEMOVATE ) 0.05 % Apply 1 Application topically 2 (two) times daily. 08/23/23   de [place], Quintin PARAS, MD  famotidine  (PEPCID ) 20 MG tablet Take 1 tablet (20 mg total) by mouth 2 (two) times daily. 08/23/23   de [place], Quintin PARAS, MD  folic acid  (FOLVITE ) 1 MG tablet Take by mouth. 08/28/12   [provider]  furosemide  (LASIX ) 40 MG tablet Take 1 tablet (40 mg total) by mouth 2 (two) times daily. 08/23/23   de [place], Quintin PARAS, MD  potassium  chloride (MICRO-K ) 10 MEQ CR capsule Take 4 capsules (40 mEq total) by mouth daily. 08/23/23   de [place], Quintin PARAS, MD  predniSONE  (DELTASONE ) 20 MG tablet Take 1 tablet (20 mg total) by mouth daily with breakfast. 10/18/15   Funches, Josalyn, MD  spironolactone  (ALDACTONE ) 25 MG tablet Take 1 tablet (25 mg total) by mouth daily. 08/23/23   de [place], Raymond J, MD    Physical Exam: Vitals:   12/22/23 2330 12/22/23 2345 12/23/23 0000 12/23/23 0004  BP:  130/86 124/79   Pulse: (!) 109 (!) 106 (!) 112   Resp: (!) 23 (!) 26 18   Temp:    98.5 F (36.9 C)  TempSrc:    Oral  SpO2: 97% 95% 98%   Weight:      Height:        Constitutional: NAD, calm  Eyes: PERTLA, lids and conjunctivae normal ENMT: Mucous membranes are moist. Posterior pharynx clear of any exudate or lesions.   Neck: supple, no masses  Respiratory: Speaking in full sentences. Diminished bilaterally. No wheezing.   Cardiovascular: Rate ~120 and regular. Pretibial pitting edema.  Abdomen: No tenderness, soft. Bowel sounds active.  Musculoskeletal: no clubbing / cyanosis. No joint deformity upper and lower extremities.   Skin: Erythematous and hyperpigmented plaques involving extremities. Warm, dry, well-perfused. Neurologic: CN 2-12 grossly intact. Moving all extremities. Alert and oriented.  Psychiatric: Pleasant. Cooperative.    Labs and Imaging on Admission: I have personally reviewed following labs and imaging studies  CBC: Recent Labs  Lab 12/22/23 1958  WBC 10.4  NEUTROABS 6.9  HGB 11.6*  HCT 40.9  MCV 80.2  PLT 293   Basic Metabolic Panel: Recent Labs  Lab 12/22/23 1958  NA 142  K 3.6  CL 106  CO2 28  GLUCOSE 118*  BUN 16  CREATININE 0.70  CALCIUM 9.4   GFR: Estimated Creatinine Clearance: 129.2 mL/min (by C-G formula based on SCr of 0.7 mg/dL). Liver Function Tests: Recent Labs  Lab 12/22/23 1958  AST 44*  ALT 31  ALKPHOS 96  BILITOT 0.4  PROT 8.0  ALBUMIN 3.6   No results for input(s):  LIPASE, AMYLASE in the last 168 hours. No results for input(s): AMMONIA in the last 168 hours. Coagulation Profile: No results for input(s): INR, PROTIME in the last 168 hours. Cardiac Enzymes: No results for input(s): CKTOTAL, CKMB, CKMBINDEX, TROPONINI in the last 168 hours. BNP (last 3 results) No results for input(s): PROBNP in the last 8760 hours. HbA1C: No results for input(s): HGBA1C in the last 72 hours. CBG: No results for input(s): GLUCAP in the last 168 hours. Lipid Profile: No results for input(s): CHOL, HDL, LDLCALC, TRIG, CHOLHDL, LDLDIRECT in the last 72 hours. Thyroid Function Tests: No results for input(s): TSH, T4TOTAL, FREET4, T3FREE, THYROIDAB in the last  72 hours. Anemia Panel: No results for input(s): VITAMINB12, FOLATE, FERRITIN, TIBC, IRON, RETICCTPCT in the last 72 hours. Urine analysis:    Component Value Date/Time   COLORURINE AMBER (A) 10/09/2015 0315   APPEARANCEUR CLOUDY (A) 10/09/2015 0315   LABSPEC 1.030 10/09/2015 0315   PHURINE 5.5 10/09/2015 0315   GLUCOSEU NEGATIVE 10/09/2015 0315   GLUCOSEU NEG mg/dL 92/98/7990 7894   HGBUR SMALL (A) 10/09/2015 0315   BILIRUBINUR Small 02/01/2016 1610   KETONESUR 15 (A) 10/09/2015 0315   PROTEINUR 30 02/01/2016 1610   PROTEINUR 30 (A) 10/09/2015 0315   UROBILINOGEN >=8.0 02/01/2016 1610   UROBILINOGEN 1.0 04/02/2015 1445   NITRITE Positve 02/01/2016 1610   NITRITE NEGATIVE 10/09/2015 0315   LEUKOCYTESUR Trace (A) 02/01/2016 1610   Sepsis Labs: @LABRCNTIP (procalcitonin:4,lacticidven:4) ) Recent Results (from the past 240 hours)  Resp panel by RT-PCR (RSV, Flu A&B, Covid) Anterior Nasal Swab     Status: None   Collection Time: 12/22/23  8:05 PM   Specimen: Anterior Nasal Swab  Result Value Ref Range Status   SARS Coronavirus 2 by RT PCR NEGATIVE NEGATIVE Final   Influenza A by PCR NEGATIVE NEGATIVE Final   Influenza B by PCR NEGATIVE NEGATIVE  Final    Comment: (NOTE) The Xpert Xpress SARS-CoV-2/FLU/RSV plus assay is intended as an aid in the diagnosis of influenza from Nasopharyngeal swab specimens and should not be used as a sole basis for treatment. Nasal washings and aspirates are unacceptable for Xpert Xpress SARS-CoV-2/FLU/RSV testing.  Fact Sheet for Patients: BloggerCourse.com  Fact Sheet for Healthcare Providers: SeriousBroker.it  This test is not yet approved or cleared by the [place]  FDA and has been authorized for detection and/or diagnosis of SARS-CoV-2 by FDA under an Emergency Use Authorization (EUA). This EUA will remain in effect (meaning this test can be used) for the duration of the COVID-19 declaration under Section 564(b)(1) of the Act, 21 U.S.C. section 360bbb-3(b)(1), unless the authorization is terminated or revoked.     Resp Syncytial Virus by PCR NEGATIVE NEGATIVE Final    Comment: (NOTE) Fact Sheet for Patients: BloggerCourse.com  Fact Sheet for Healthcare Providers: SeriousBroker.it  This test is not yet approved or cleared by the [place]  FDA and has been authorized for detection and/or diagnosis of SARS-CoV-2 by FDA under an Emergency Use Authorization (EUA). This EUA will remain in effect (meaning this test can be used) for the duration of the COVID-19 declaration under Section 564(b)(1) of the Act, 21 U.S.C. section 360bbb-3(b)(1), unless the authorization is terminated or revoked.  Performed at [facility], [address], [place], [place] [address]      Radiological Exams on Admission: DG Chest Portable 1 View Result Date: 12/22/2023 CLINICAL DATA:  sob EXAM: PORTABLE CHEST - 1 VIEW COMPARISON:  April 10, 2016 FINDINGS: Elevation of the right hemidiaphragm. Diffuse bilateral perihilar interstitial opacities. No focal airspace consolidation, pleural effusion, or  pneumothorax. Mild cardiomegaly.No acute fracture or destructive lesion. Multilevel thoracic osteophytosis. IMPRESSION: Cardiomegaly with findings of either interstitial edema or atypical/viral infection. Electronically Signed   By: Rogelia Myers M.D.   On: 12/22/2023 20:32    EKG: Independently reviewed. Sinus tachycardia, rate 121, QTc 515 ms.   Assessment/Plan   1. Acute on chronic hypoxic & hypercarbic respiratory failure; ILD  - Could be ILD exacerbation; CHF, pneumonia, PE, and asthma exacerbation also considered  - Culture sputum, check procalcitonin, BNP, respiratory virus panel, and CTA chest, continue systemic steroids, start empiric antibiotics, resume her  oral diuretics, use BiPAP while sleeping, continue short-acting bronchodilators as-needed    2. Chronic HFpEF  - EF was 56% in September 2024  - Acute CHF is on the Ddx, exam limited by body habitus, plan to resume her oral diuretics for now, check BNP and chest CT   3. SLE; psoriasis; psoriatic arthritis  - Has not been on steroids or other treatments recently  - Treating with systemic steroids currently, resume clobetasol , needs to establish with local rheumatologist   4. OSA  - BiPAP while sleeping   5. Prolonged QT interval  - Supplement potassium, check magnesium  level, avoid QT-prolonging medications    DVT prophylaxis: Lovenox   Code Status: Full  Level of Care: Level of care: Progressive Family Communication: None present  Disposition Plan: Patient is from: Home  Anticipated d/c is to: Home  Anticipated d/c date is: 12/25/23  Patient currently: Pending chest CT, additional labs, improved respiratory status  Consults called: None  Admission status: Inpatient     Evalene GORMAN Sprinkles, MD [facility]  12/23/2023, 1:28 AM

## 2023-12-23 NOTE — Plan of Care (Signed)
   Problem: Education: Goal: Knowledge of General Education information will improve Description Including pain rating scale, medication(s)/side effects and non-pharmacologic comfort measures Outcome: Progressing   Problem: Health Behavior/Discharge Planning: Goal: Ability to manage health-related needs will improve Outcome: Progressing

## 2023-12-23 NOTE — Progress Notes (Addendum)
 PROGRESS NOTE    Donna Hall  FMW:[id number] DOB: [birth year] DOA: 12/22/2023 PCP: de [place], Raymond J, MD   Brief Narrative: 38 year old for fnd, psoriatic arthritis, ILD, OSA, BMI 60, chronic heart failure preserved ejection fraction, chronic hypoxic and hypercarbic respiratory failure presented with worsening shortness of breath, increased cough and sputum production and pain in her upper back with deep breath.  Patient ran out of medication for 2 months.  She presents with worsening shortness of breath.  evaluation in the ed:  influenza covid rsv negative, chest x-ray cardiomegaly and diffuse perihilar ESRD showed opacities bilaterally.  Patient admitted for acute on chronic respiratory failure.   Assessment & Plan:   Principal Problem:   Respiratory failure with hypoxia and hypercapnia (HCC) Active Problems:   INTERSTITIAL LUNG DISEASE   Rheumatoid arthritis (HCC)   Systemic lupus erythematosus (HCC)   Moderate obstructive sleep apnea   Chronic heart failure with preserved ejection fraction (HFpEF) (HCC)   Prolonged QT interval   1-Acute on Chronic Hypoxic Hypercapnic Respiratory Failure ILD -Patient presented with worsening shortness of breath, BNP 36, procalcitonin less than 0.10, white blood cell 10, respiratory panel negative.  -CT angio chest: Negative for central PE, signs of pulmonary hypertension,Ground-glass attenuation is noted within the upper and lower lung zones with scattered areas of air trapping. Findings are nonspecific but may reflect pulmonary edema or atypical infection. Air trapping noted within both lungs suggesting small airways disease. - Continue with IV Solu-Medrol , DuoNebs, scheduled Lasix  - Continue ceftriaxone  and azithromycin  -Start Brovana , Pulmicort .   Chronic heart failure preserved ejection fraction: - Continue with Lasix  and spironolactone   SLE, psoriasis arthritis -on Prednisone  10 mg daily   OSA: -Continue CPAP/  -she will  need Oxygen  at discharge, her oxygen  tank is not working.   Prolong Qt: -Replete electrolyte.   Morbid Obesity Needs life style modification.   Estimated body mass index is 60.4 kg/m as calculated from the following:   Height as of this encounter: 5' 1 (1.549 m).   Weight as of this encounter: 145 kg.   DVT prophylaxis: Lovenox  Code Status: Full code Family Communication: care discussed with patient.  Disposition Plan:  Status is: Inpatient Remains inpatient appropriate because: management of Resp failure    Consultants:  none  Procedures:  none  Antimicrobials:    Subjective: She presents with worsening dyspnea. Recently move here from [place] ./  She ran out of meds.  Report productive cough and dyspnea.   Objective: Vitals:   12/23/23 0000 12/23/23 0004 12/23/23 0200 12/23/23 0538  BP: 124/79  123/65   Pulse: (!) 112  97 99  Resp: 18  18 (!) 25  Temp:  98.5 F (36.9 C) 98.2 F (36.8 C)   TempSrc:  Oral Oral   SpO2: 98%  97% 94%  Weight:      Height:        Intake/Output Summary (Last 24 hours) at 12/23/2023 0721 Last data filed at 12/23/2023 0359 Gross per 24 hour  Intake 144.85 ml  Output --  Net 144.85 ml   Filed Weights   12/22/23 1949  Weight: (!) 145 kg    Examination:  General exam: Appears calm and comfortable  Respiratory system: BL ronchus.  Respiratory effort normal. Cardiovascular system: S1 & S2 heard, RRR. Gastrointestinal system: Abdomen is nondistended, soft and nontender. No organomegaly or masses felt. Normal bowel sounds heard. Central nervous system: Alert and oriented. No focal neurological deficits. Extremities: Symmetric 5 x 5 power. Skin: No rashes,  lesions or ulcers Psychiatry: Judgement and insight appear normal. Mood & affect appropriate.     Data Reviewed: I have personally reviewed following labs and imaging studies  CBC: Recent Labs  Lab 12/22/23 1958 12/23/23 0300  WBC 10.4 8.9  NEUTROABS 6.9  --    HGB 11.6* 11.1*  HCT 40.9 39.1  MCV 80.2 79.8*  PLT 293 245   Basic Metabolic Panel: Recent Labs  Lab 12/22/23 1958 12/23/23 0300  NA 142 140  K 3.6 4.0  CL 106 104  CO2 28 27  GLUCOSE 118* 206*  BUN 16 15  CREATININE 0.70 0.52  CALCIUM 9.4 9.2  MG  --  2.5*   GFR: Estimated Creatinine Clearance: 129.2 mL/min (by C-G formula based on SCr of 0.52 mg/dL). Liver Function Tests: Recent Labs  Lab 12/22/23 1958  AST 44*  ALT 31  ALKPHOS 96  BILITOT 0.4  PROT 8.0  ALBUMIN 3.6   No results for input(s): LIPASE, AMYLASE in the last 168 hours. No results for input(s): AMMONIA in the last 168 hours. Coagulation Profile: No results for input(s): INR, PROTIME in the last 168 hours. Cardiac Enzymes: No results for input(s): CKTOTAL, CKMB, CKMBINDEX, TROPONINI in the last 168 hours. BNP (last 3 results) No results for input(s): PROBNP in the last 8760 hours. HbA1C: No results for input(s): HGBA1C in the last 72 hours. CBG: No results for input(s): GLUCAP in the last 168 hours. Lipid Profile: No results for input(s): CHOL, HDL, LDLCALC, TRIG, CHOLHDL, LDLDIRECT in the last 72 hours. Thyroid Function Tests: No results for input(s): TSH, T4TOTAL, FREET4, T3FREE, THYROIDAB in the last 72 hours. Anemia Panel: No results for input(s): VITAMINB12, FOLATE, FERRITIN, TIBC, IRON, RETICCTPCT in the last 72 hours. Sepsis Labs: Recent Labs  Lab 12/23/23 0300  PROCALCITON <0.10    Recent Results (from the past 240 hours)  Resp panel by RT-PCR (RSV, Flu A&B, Covid) Anterior Nasal Swab     Status: None   Collection Time: 12/22/23  8:05 PM   Specimen: Anterior Nasal Swab  Result Value Ref Range Status   SARS Coronavirus 2 by RT PCR NEGATIVE NEGATIVE Final   Influenza A by PCR NEGATIVE NEGATIVE Final   Influenza B by PCR NEGATIVE NEGATIVE Final    Comment: (NOTE) The Xpert Xpress SARS-CoV-2/FLU/RSV plus assay is intended as  an aid in the diagnosis of influenza from Nasopharyngeal swab specimens and should not be used as a sole basis for treatment. Nasal washings and aspirates are unacceptable for Xpert Xpress SARS-CoV-2/FLU/RSV testing.  Fact Sheet for Patients: BloggerCourse.com  Fact Sheet for Healthcare Providers: SeriousBroker.it  This test is not yet approved or cleared by the [place]  FDA and has been authorized for detection and/or diagnosis of SARS-CoV-2 by FDA under an Emergency Use Authorization (EUA). This EUA will remain in effect (meaning this test can be used) for the duration of the COVID-19 declaration under Section 564(b)(1) of the Act, 21 U.S.C. section 360bbb-3(b)(1), unless the authorization is terminated or revoked.     Resp Syncytial Virus by PCR NEGATIVE NEGATIVE Final    Comment: (NOTE) Fact Sheet for Patients: BloggerCourse.com  Fact Sheet for Healthcare Providers: SeriousBroker.it  This test is not yet approved or cleared by the [place]  FDA and has been authorized for detection and/or diagnosis of SARS-CoV-2 by FDA under an Emergency Use Authorization (EUA). This EUA will remain in effect (meaning this test can be used) for the duration of the COVID-19 declaration under Section 564(b)(1) of the  Act, 21 U.S.C. section 360bbb-3(b)(1), unless the authorization is terminated or revoked.  Performed at [facility], [address], [place], [place] [address]   MRSA Next Gen by PCR, Nasal     Status: None   Collection Time: 12/23/23  2:05 AM   Specimen: Nasal Mucosa; Nasal Swab  Result Value Ref Range Status   MRSA by PCR Next Gen NOT DETECTED NOT DETECTED Final    Comment: (NOTE) The GeneXpert MRSA Assay (FDA approved for NASAL specimens only), is one component of a comprehensive MRSA colonization surveillance program. It is not intended to diagnose MRSA  infection nor to guide or monitor treatment for MRSA infections. Test performance is not FDA approved in patients less than 63 years old. Performed at [facility], [address] [place], [place] [address]   Respiratory (~20 pathogens) panel by PCR     Status: None   Collection Time: 12/23/23  2:15 AM   Specimen: Nasopharyngeal Swab; Respiratory  Result Value Ref Range Status   Adenovirus NOT DETECTED NOT DETECTED Final   Coronavirus 229E NOT DETECTED NOT DETECTED Final    Comment: (NOTE) The Coronavirus on the Respiratory Panel, DOES NOT test for the novel  Coronavirus (2019 nCoV)    Coronavirus HKU1 NOT DETECTED NOT DETECTED Final   Coronavirus NL63 NOT DETECTED NOT DETECTED Final   Coronavirus OC43 NOT DETECTED NOT DETECTED Final   Metapneumovirus NOT DETECTED NOT DETECTED Final   Rhinovirus / Enterovirus NOT DETECTED NOT DETECTED Final   Influenza A NOT DETECTED NOT DETECTED Final   Influenza B NOT DETECTED NOT DETECTED Final   Parainfluenza Virus 1 NOT DETECTED NOT DETECTED Final   Parainfluenza Virus 2 NOT DETECTED NOT DETECTED Final   Parainfluenza Virus 3 NOT DETECTED NOT DETECTED Final   Parainfluenza Virus 4 NOT DETECTED NOT DETECTED Final   Respiratory Syncytial Virus NOT DETECTED NOT DETECTED Final   Bordetella pertussis NOT DETECTED NOT DETECTED Final   Bordetella Parapertussis NOT DETECTED NOT DETECTED Final   Chlamydophila pneumoniae NOT DETECTED NOT DETECTED Final   Mycoplasma pneumoniae NOT DETECTED NOT DETECTED Final    Comment: Performed at [facility], [address], [place], [place] [address]         Radiology Studies: CT Angio Chest Pulmonary Embolism (PE) W or WO Contrast Result Date: 12/23/2023 CLINICAL DATA:  Pulmonary embolism suspected.  High probability. EXAM: CT ANGIOGRAPHY CHEST WITH CONTRAST TECHNIQUE: Multidetector CT imaging of the chest was performed using the standard protocol during bolus administration of intravenous  contrast. Multiplanar CT image reconstructions and MIPs were obtained to evaluate the vascular anatomy. RADIATION DOSE REDUCTION: This exam was performed according to the departmental dose-optimization program which includes automated exposure control, adjustment of the mA and/or kV according to patient size and/or use of iterative reconstruction technique. CONTRAST:  75mL OMNIPAQUE  IOHEXOL  350 MG/ML SOLN COMPARISON:  10/09/2015 FINDINGS: Cardiovascular: Exam detail diminished due to suboptimal pulmonary arterial opacification and respiratory motion artifact. Within this limitation, there is no sign of central obstructing pulmonary embolus to the level of the proximal segmental pulmonary arteries bilaterally. Signs of PA hypertension noted. The main pulmonary artery measures 4.5 cm. Mild cardiac enlargement. No significant aortic or coronary artery calcifications. No pericardial effusion. Mediastinum/Nodes: Peripherally calcified nodule in the left lobe of thyroid gland measures 1.3 cm, image 4/5. Not clinically significant; no follow-up imaging recommended(Ref: J Am Coll Radiol. 2015 Feb;12(2): 143-50). Decreased AP diameter of the trachea is noted. Esophagus demonstrates no  significant findings. No mediastinal adenopathy. Right hilar lymph node measures 1.3 cm, image 40/5. There is a left hilar node measuring 1.1 cm, image 55/4. Lungs/Pleura: Pulmonary parenchymal detail is diminished due to respiratory motion artifact. No significant pleural effusion, airspace consolidation or pneumothorax. Ground-glass attenuation is noted within the upper and lower lung zones with scattered areas of air trapping. Calcified granuloma noted in the right upper lobe. Upper Abdomen: No acute abnormality. Gallstones are suspected. There is a large, ventral abdominal wall hernia containing loops of large and small bowel. This is only partially visualized. Musculoskeletal: No fractures seen.  Thoracic spondylosis. Review of the MIP  images confirms the above findings. IMPRESSION: 1. Exam detail diminished due to suboptimal pulmonary arterial opacification and respiratory motion artifact. Within this limitation, there is no sign of central obstructing pulmonary embolus to the level of the proximal segmental pulmonary arteries bilaterally. 2. Signs of PA hypertension. 3. Ground-glass attenuation is noted within the upper and lower lung zones with scattered areas of air trapping. Findings are nonspecific but may reflect pulmonary edema or atypical infection. 4. Air trapping noted within both lungs suggesting small airways disease. 5. 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 6. Large, ventral abdominal wall hernia containing loops of large and small bowel. This is only partially visualized. 7. Cholelithiasis. Electronically Signed   By: Waddell Calk M.D.   On: 12/23/2023 06:52   DG Chest Portable 1 View Result Date: 12/22/2023 CLINICAL DATA:  sob EXAM: PORTABLE CHEST - 1 VIEW COMPARISON:  April 10, 2016 FINDINGS: Elevation of the right hemidiaphragm. Diffuse bilateral perihilar interstitial opacities. No focal airspace consolidation, pleural effusion, or pneumothorax. Mild cardiomegaly.No acute fracture or destructive lesion. Multilevel thoracic osteophytosis. IMPRESSION: Cardiomegaly with findings of either interstitial edema or atypical/viral infection. Electronically Signed   By: Rogelia Myers M.D.   On: 12/22/2023 20:32        Scheduled Meds:  enoxaparin  (LOVENOX ) injection  70 mg Subcutaneous Q24H   famotidine   20 mg Oral BID   furosemide   40 mg Oral BID   methylPREDNISolone  (SOLU-MEDROL ) injection  125 mg Intravenous Q12H   Followed by   NOREEN ON 12/24/2023] predniSONE   40 mg Oral Q breakfast   sodium chloride  flush  3 mL Intravenous Q12H   spironolactone   25 mg Oral Daily   Continuous Infusions:  cefTRIAXone  (ROCEPHIN )  IV Stopped (12/23/23 0330)   doxycycline  (VIBRAMYCIN ) IV 100 mg  (12/23/23 0544)     LOS: 0 days    Time spent: 35 minutes    Seham Gardenhire A Carneshia Raker, MD [facility]   If 7PM-7AM, please contact night-coverage [email or web address]  12/23/2023, 7:21 AM

## 2023-12-23 NOTE — Progress Notes (Signed)
 Spanish interpreter , Nestar [phone], utilized before, during and post PIV insertion procedure.

## 2023-12-24 DIAGNOSIS — J9601 Acute respiratory failure with hypoxia: Secondary | ICD-10-CM

## 2023-12-24 LAB — BASIC METABOLIC PANEL WITH GFR
Anion gap: 10 (ref 5–15)
BUN: 13 mg/dL (ref 6–20)
CO2: 29 mmol/L (ref 22–32)
Calcium: 8.6 mg/dL — ABNORMAL LOW (ref 8.9–10.3)
Chloride: 99 mmol/L (ref 98–111)
Creatinine, Ser: 0.56 mg/dL (ref 0.44–1.00)
GFR, Estimated: >60 mL/min (ref >60)
Glucose, Bld: 162 mg/dL — ABNORMAL HIGH (ref 70–99)
Potassium: 4.2 mmol/L (ref 3.5–5.1)
Sodium: 138 mmol/L (ref 135–145)

## 2023-12-24 LAB — CBC
HCT: 36.5 % (ref 36.0–46.0)
Hemoglobin: 10.5 g/dL — ABNORMAL LOW (ref 12.0–15.0)
MCH: 23.2 pg — ABNORMAL LOW (ref 26.0–34.0)
MCHC: 28.8 g/dL — ABNORMAL LOW (ref 30.0–36.0)
MCV: 80.6 fL (ref 80.0–100.0)
Platelets: 251 10*3/uL (ref 150–400)
RBC: 4.53 MIL/uL (ref 3.87–5.11)
RDW: 16.4 % — ABNORMAL HIGH (ref 11.5–15.5)
WBC: 10.8 10*3/uL — ABNORMAL HIGH (ref 4.0–10.5)
nRBC: 0 % (ref 0.0–0.2)

## 2023-12-24 LAB — GLUCOSE, CAPILLARY
Glucose-Capillary: 167 mg/dL — ABNORMAL HIGH (ref 70–99)
Glucose-Capillary: 172 mg/dL — ABNORMAL HIGH (ref 70–99)
Glucose-Capillary: 135 mg/dL — ABNORMAL HIGH (ref 70–99)
Glucose-Capillary: 131 mg/dL — ABNORMAL HIGH (ref 70–99)

## 2023-12-24 MED ORDER — DOXYCYCLINE HYCLATE 100 MG PO TABS
100.0000 mg | ORAL_TABLET | Freq: Two times a day (BID) | ORAL | Status: DC
  Administered 2023-12-24 – 2023-12-25 (×2): 100 mg via ORAL
  Filled 2023-12-24 (×2): qty 1

## 2023-12-24 MED ORDER — FUROSEMIDE 40 MG PO TABS
40.0000 mg | ORAL_TABLET | Freq: Two times a day (BID) | ORAL | Status: DC
  Administered 2023-12-25: 40 mg via ORAL
  Filled 2023-12-24: qty 1

## 2023-12-24 MED ORDER — IPRATROPIUM-ALBUTEROL 0.5-2.5 (3) MG/3ML IN SOLN
3.0000 mL | Freq: Three times a day (TID) | RESPIRATORY_TRACT | Status: DC
  Administered 2023-12-24 (×2): 3 mL via RESPIRATORY_TRACT
  Filled 2023-12-24 (×3): qty 3

## 2023-12-24 MED ORDER — ALBUTEROL SULFATE (2.5 MG/3ML) 0.083% IN NEBU: 2.5000 mg | INHALATION_SOLUTION | Freq: Four times a day (QID) | RESPIRATORY_TRACT | Status: DC | PRN

## 2023-12-24 MED ORDER — FUROSEMIDE 10 MG/ML IJ SOLN
40.0000 mg | Freq: Once | INTRAMUSCULAR | Status: AC
  Administered 2023-12-24: 40 mg via INTRAVENOUS
  Filled 2023-12-24: qty 4

## 2023-12-24 MED ORDER — HYDROCERIN EX CREA
TOPICAL_CREAM | Freq: Two times a day (BID) | CUTANEOUS | Status: DC
  Filled 2023-12-24 (×2): qty 113

## 2023-12-24 NOTE — Plan of Care (Signed)

## 2023-12-24 NOTE — TOC Initial Note (Signed)
 Transition of Care [facility]) - Initial/Assessment Note    Patient Details  Name: Donna Hall MRN: [id number] Date of Birth: 27-Sep-1985  [facility] CM/SW Contact:    Roxie KANDICE Stain, RN Phone Number: 12/24/2023, 2:52 PM  Clinical Narrative:                  Spoke to patient via interpretor regarding transition needs.  Patient lives with kids. Patient's children assist with transportation.  Patient has home 02 from [employer] but concentrator is broken and she has empty portable tanks. Notified Mitch with [employer] and he states he has asked for dc tank and a service ticket for concentrator.  Patient is agreeable for FC to review for potential medicaid.  TOC will follow.  Expected Discharge Plan: Home/Self Care Barriers to Discharge: Continued Medical Work up   Patient Goals and CMS Choice Patient states their goals for this hospitalization and ongoing recovery are:: Return home          Expected Discharge Plan and Services In-house Referral: Artist Discharge Planning Services: MATCH Program, Medication Assistance   Living arrangements for the past 2 months: Single Family Home                                      Prior Living Arrangements/Services Living arrangements for the past 2 months: Single Family Home Lives with:: Adult Children Patient language and need for interpreter reviewed:: Yes Do you feel safe going back to the place where you live?: Yes      Need for Family Participation in Patient Care: Yes (Comment) Care giver support system in place?: Yes (comment) Current home services: DME (home 02) Criminal Activity/Legal Involvement Pertinent to Current Situation/Hospitalization: No - Comment as needed  Activities of Daily Living   ADL Screening (condition at time of admission) Independently performs ADLs?: No Does the patient have a NEW difficulty with bathing/dressing/toileting/self-feeding that is expected to last >3 days?:  No Does the patient have a NEW difficulty with getting in/out of bed, walking, or climbing stairs that is expected to last >3 days?: No Does the patient have a NEW difficulty with communication that is expected to last >3 days?: No Is the patient deaf or have difficulty hearing?: No Does the patient have difficulty seeing, even when wearing glasses/contacts?: No Does the patient have difficulty concentrating, remembering, or making decisions?: No  Permission Sought/Granted                  Emotional Assessment Appearance:: Appears stated age Attitude/Demeanor/Rapport: Gracious Affect (typically observed): Accepting Orientation: : Oriented to Self, Oriented to Place, Oriented to  Time, Oriented to Situation Alcohol / Substance Use: Not Applicable Psych Involvement: No (comment)  Admission diagnosis:  Interstitial lung disease (HCC) [J84.9] Acute respiratory failure with hypoxia (HCC) [J96.01] Respiratory failure with hypoxia and hypercapnia (HCC) [J96.91, J96.92] Patient Active Problem List   Diagnosis Date Noted   Respiratory failure with hypoxia and hypercapnia (HCC) 12/23/2023   Chronic heart failure with preserved ejection fraction (HFpEF) (HCC) 12/23/2023   Prolonged QT interval 12/23/2023   Severe obesity (BMI >= 40) (HCC) 08/23/2023   Genital warts 10/18/2015   IUD contraception 10/18/2015   Dyspnea 10/09/2015   Acute on chronic respiratory failure with hypoxia (HCC) 10/09/2015   HCAP (healthcare-associated pneumonia) 10/09/2015   Chronic respiratory failure with hypoxia (HCC) 09/15/2015   Asthma exacerbation    Cough  05/18/2015   History of pulmonary embolism 04/02/2015   Chronic pain syndrome 10/23/2014   Condyloma acuminatum of perianal region 10/23/2014   Chronic anticoagulation    Moderate obstructive sleep apnea 10/13/2014   Itching 09/18/2014   Cough variant asthma vs UACS 07/23/2014   Dizziness 07/13/2014   Headache 07/03/2014   Anxiety and depression  05/14/2014   Knee pain, right 04/02/2014   Essential hypertension, benign 03/12/2014   Systemic lupus erythematosus (HCC) 06/15/2013   Other long term (current) drug therapy 01/21/2013   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   Shortness of breath 12/27/2012   Erythrocyte aplasia (HCC) 12/25/2012   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   Arthritis or polyarthritis, rheumatoid (HCC) 09/30/2012   Obesity, Class III, BMI 40-49.9 (morbid obesity) 03/23/2012   Extreme obesity 03/23/2012   Rheumatoid arthritis (HCC) 03/07/2012   Psoriasis 03/07/2012   Long term current use of systemic steroids 03/07/2012   Synovitis and tenosynovitis 03/07/2012   Dermatitis seborrheica 12/01/2010   Collagen disease (HCC) 10/26/2010   Diffuse connective tissue disease (HCC) 10/26/2010   NSIP (nonspecific interstitial pneumonia) (HCC) 09/20/2009   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 [place] [address] Phone: [phone] Fax: [phone]     Social Drivers of Health (SDOH) Social History: SDOH Screenings   Food Insecurity: Food Insecurity Present (12/23/2023)  Housing: Low Risk  (12/23/2023)  Transportation Needs: Unmet Transportation Needs (12/23/2023)  Utilities: At Risk (12/23/2023)  Depression (PHQ2-9): Low Risk  (08/23/2023)  Financial Resource Strain: Medium Risk (03/26/2023)   Received from [facility]  Physical Activity: Inactive (03/26/2023)   Received from [facility]  Social Connections: Socially Isolated (03/26/2023)   Received from [facility]  Stress: No  Stress Concern Present (03/26/2023)   Received from [facility]  Tobacco Use: Low Risk  (12/23/2023)   SDOH Interventions:     Readmission Risk Interventions     No data to display

## 2023-12-24 NOTE — Progress Notes (Addendum)
   12/24/23 0225  Respiratory Severity Assessment  $ Protocol Assessment  Yes  Heart Rate 0  Breath Sounds 1  Respiratory Pattern 0  Cough 1  Chest X Ray 1  O2 Requirements/ Pulse Ox Sat(%) 1  Mental Status 0  Dyspnea 1  Score Total 5  Aerosolized Bronchodilators  Aerosolized bronchodilator indications Aerosolized bronchodilator indicated  Medication Plan of Care Hand held neb treatment  Bronchial Hygiene  Bronchial Hygiene Plan of Care Cough & Deep breath  Oxygen  Therapy  Oxygen  therapy indications Oxygen  therapy indicated  Oxygen  Plan of care Nasal cannula  Respiratory Therapy Follow Up Assessment  Assessment follow up date 12/24/23  Follow-up assessment complete Yes   Pt with history of heart failure and ILD admitted with increased WOB, has not had meds in 2 months. Tonight patient look to be improving. BBS clear, diminished. Decreasing treatment to TID per RT protocol. Also adding Albuterol  Q6PRN. Pt also take Pulmicort  and Brovana  that was just started on this admission. Only takes Albuterol  at home, but has been out of meds.

## 2023-12-24 NOTE — Progress Notes (Signed)
 SATURATION QUALIFICATIONS: (This note is used to comply with regulatory documentation for home oxygen )  Patient Saturations on Room Air at Rest = 92%  Patient Saturations on Room Air while Ambulating = 87%  Patient Saturations on 3 Liters of oxygen  while Ambulating = 94%  Please briefly explain why patient needs home oxygen : Patient very short of breath without oxygen .

## 2023-12-24 NOTE — Progress Notes (Signed)
 PROGRESS NOTE    Donna Hall  FMW:[id number] DOB: July 23, 1985 DOA: 12/22/2023 PCP: de [place], Raymond J, MD   Brief Narrative: 38 year old for fnd, psoriatic arthritis, ILD, OSA, BMI 60, chronic heart failure preserved ejection fraction, chronic hypoxic and hypercarbic respiratory failure presented with worsening shortness of breath, increased cough and sputum production and pain in her upper back with deep breath.  Patient ran out of medication for 2 months.  She presents with worsening shortness of breath.  evaluation in the ed:  influenza covid rsv negative, chest x-ray cardiomegaly and diffuse perihilar ESRD showed opacities bilaterally.  Patient admitted for acute on chronic respiratory failure.   Assessment & Plan:   Principal Problem:   Respiratory failure with hypoxia and hypercapnia (HCC) Active Problems:   INTERSTITIAL LUNG DISEASE   Rheumatoid arthritis (HCC)   Systemic lupus erythematosus (HCC)   Moderate obstructive sleep apnea   Chronic heart failure with preserved ejection fraction (HFpEF) (HCC)   Prolonged QT interval   1-Acute on Chronic Hypoxic Hypercapnic Respiratory Failure ILD -Patient presented with worsening shortness of breath, BNP 36, procalcitonin less than 0.10, white blood cell 10, respiratory panel negative.  -CT angio chest: Negative for central PE, signs of pulmonary hypertension,Ground-glass attenuation is noted within the upper and lower lung zones with scattered areas of air trapping. Findings are nonspecific but may reflect pulmonary edema or atypical infection. Air trapping noted within both lungs suggesting small airways disease. - Continue with IV Solu-Medrol --change to prednisone , DuoNebs, scheduled Lasix  - Continue ceftriaxone  and Doxy -Continue with Brovana , Pulmicort .  Will give one dose of IV lasix  today   Chronic heart failure preserved ejection fraction: - Continue with Lasix  and spironolactone   SLE, psoriasis arthritis -on  Prednisone  10 mg daily   OSA: -Continue CPAP/  -she will need Oxygen  at discharge, her oxygen  tank is not working.   Prolong Qt: -Replete electrolyte.   Morbid Obesity Needs life style modification.   Estimated body mass index is 60.4 kg/m as calculated from the following:   Height as of this encounter: 5' 1 (1.549 m).   Weight as of this encounter: 145 kg.   DVT prophylaxis: Lovenox  Code Status: Full code Family Communication: care discussed with patient.  Disposition Plan:  Status is: Inpatient Remains inpatient appropriate because: management of Resp failure    Consultants:  none  Procedures:  none  Antimicrobials:    Subjective: Needs home oxygen  arrangements, her oxygen  tank is not working.  She is breathing a little better, cough improving.    Objective: Vitals:   12/23/23 2159 12/23/23 2200 12/23/23 2326 12/24/23 0456  BP:   131/76 (!) 117/58  Pulse:  69 62 100  Resp:  (!) 21 20 18   Temp:   97.7 F (36.5 C) 97.7 F (36.5 C)  TempSrc:   Axillary Axillary  SpO2: 100% 100% 94% 98%  Weight:      Height:        Intake/Output Summary (Last 24 hours) at 12/24/2023 0714 Last data filed at 12/24/2023 0100 Gross per 24 hour  Intake 740 ml  Output --  Net 740 ml   Filed Weights   12/22/23 1949  Weight: (!) 145 kg    Examination:  General exam: NAD Respiratory system: Normal resp effort, BL air movement.  Cardiovascular system: S 1, S 2 RRR Gastrointestinal system:BS present, soft,nt Central nervous system: alert Extremities: no edema    Data Reviewed: I have personally reviewed following labs and imaging studies  CBC: Recent Labs  Lab 12/22/23 1958 12/23/23 0300 12/24/23 0229  WBC 10.4 8.9 10.8*  NEUTROABS 6.9  --   --   HGB 11.6* 11.1* 10.5*  HCT 40.9 39.1 36.5  MCV 80.2 79.8* 80.6  PLT 293 245 251   Basic Metabolic Panel: Recent Labs  Lab 12/22/23 1958 12/23/23 0300 12/24/23 0229  NA 142 140 138  K 3.6 4.0 4.2  CL 106  104 99  CO2 28 27 29   GLUCOSE 118* 206* 162*  BUN 16 15 13   CREATININE 0.70 0.52 0.56  CALCIUM 9.4 9.2 8.6*  MG  --  2.5*  --    GFR: Estimated Creatinine Clearance: 129.2 mL/min (by C-G formula based on SCr of 0.56 mg/dL). Liver Function Tests: Recent Labs  Lab 12/22/23 1958  AST 44*  ALT 31  ALKPHOS 96  BILITOT 0.4  PROT 8.0  ALBUMIN 3.6   No results for input(s): LIPASE, AMYLASE in the last 168 hours. No results for input(s): AMMONIA in the last 168 hours. Coagulation Profile: No results for input(s): INR, PROTIME in the last 168 hours. Cardiac Enzymes: No results for input(s): CKTOTAL, CKMB, CKMBINDEX, TROPONINI in the last 168 hours. BNP (last 3 results) No results for input(s): PROBNP in the last 8760 hours. HbA1C: Recent Labs    12/23/23 1336  HGBA1C 5.1   CBG: Recent Labs  Lab 12/23/23 1144 12/23/23 1616 12/23/23 2104 12/24/23 0614  GLUCAP 218* 196* 130* 167*   Lipid Profile: No results for input(s): CHOL, HDL, LDLCALC, TRIG, CHOLHDL, LDLDIRECT in the last 72 hours. Thyroid Function Tests: No results for input(s): TSH, T4TOTAL, FREET4, T3FREE, THYROIDAB in the last 72 hours. Anemia Panel: No results for input(s): VITAMINB12, FOLATE, FERRITIN, TIBC, IRON, RETICCTPCT in the last 72 hours. Sepsis Labs: Recent Labs  Lab 12/23/23 0300  PROCALCITON <0.10    Recent Results (from the past 240 hours)  Resp panel by RT-PCR (RSV, Flu A&B, Covid) Anterior Nasal Swab     Status: None   Collection Time: 12/22/23  8:05 PM   Specimen: Anterior Nasal Swab  Result Value Ref Range Status   SARS Coronavirus 2 by RT PCR NEGATIVE NEGATIVE Final   Influenza A by PCR NEGATIVE NEGATIVE Final   Influenza B by PCR NEGATIVE NEGATIVE Final    Comment: (NOTE) The Xpert Xpress SARS-CoV-2/FLU/RSV plus assay is intended as an aid in the diagnosis of influenza from Nasopharyngeal swab specimens and should not be used as a  sole basis for treatment. Nasal washings and aspirates are unacceptable for Xpert Xpress SARS-CoV-2/FLU/RSV testing.  Fact Sheet for Patients: BloggerCourse.com  Fact Sheet for Healthcare Providers: SeriousBroker.it  This test is not yet approved or cleared by the [place]  FDA and has been authorized for detection and/or diagnosis of SARS-CoV-2 by FDA under an Emergency Use Authorization (EUA). This EUA will remain in effect (meaning this test can be used) for the duration of the COVID-19 declaration under Section 564(b)(1) of the Act, 21 U.S.C. section 360bbb-3(b)(1), unless the authorization is terminated or revoked.     Resp Syncytial Virus by PCR NEGATIVE NEGATIVE Final    Comment: (NOTE) Fact Sheet for Patients: BloggerCourse.com  Fact Sheet for Healthcare Providers: SeriousBroker.it  This test is not yet approved or cleared by the [place]  FDA and has been authorized for detection and/or diagnosis of SARS-CoV-2 by FDA under an Emergency Use Authorization (EUA). This EUA will remain in effect (meaning this test can be used) for the duration of the COVID-19 declaration under Section 564(b)(1)  of the Act, 21 U.S.C. section 360bbb-3(b)(1), unless the authorization is terminated or revoked.  Performed at [facility], [address], [place], [place] [address]   MRSA Next Gen by PCR, Nasal     Status: None   Collection Time: 12/23/23  2:05 AM   Specimen: Nasal Mucosa; Nasal Swab  Result Value Ref Range Status   MRSA by PCR Next Gen NOT DETECTED NOT DETECTED Final    Comment: (NOTE) The GeneXpert MRSA Assay (FDA approved for NASAL specimens only), is one component of a comprehensive MRSA colonization surveillance program. It is not intended to diagnose MRSA infection nor to guide or monitor treatment for MRSA infections. Test performance is not FDA approved  in patients less than 42 years old. Performed at [facility], [address], [place], [place] [address]   Respiratory (~20 pathogens) panel by PCR     Status: None   Collection Time: 12/23/23  2:15 AM   Specimen: Nasopharyngeal Swab; Respiratory  Result Value Ref Range Status   Adenovirus NOT DETECTED NOT DETECTED Final   Coronavirus 229E NOT DETECTED NOT DETECTED Final    Comment: (NOTE) The Coronavirus on the Respiratory Panel, DOES NOT test for the novel  Coronavirus (2019 nCoV)    Coronavirus HKU1 NOT DETECTED NOT DETECTED Final   Coronavirus NL63 NOT DETECTED NOT DETECTED Final   Coronavirus OC43 NOT DETECTED NOT DETECTED Final   Metapneumovirus NOT DETECTED NOT DETECTED Final   Rhinovirus / Enterovirus NOT DETECTED NOT DETECTED Final   Influenza A NOT DETECTED NOT DETECTED Final   Influenza B NOT DETECTED NOT DETECTED Final   Parainfluenza Virus 1 NOT DETECTED NOT DETECTED Final   Parainfluenza Virus 2 NOT DETECTED NOT DETECTED Final   Parainfluenza Virus 3 NOT DETECTED NOT DETECTED Final   Parainfluenza Virus 4 NOT DETECTED NOT DETECTED Final   Respiratory Syncytial Virus NOT DETECTED NOT DETECTED Final   Bordetella pertussis NOT DETECTED NOT DETECTED Final   Bordetella Parapertussis NOT DETECTED NOT DETECTED Final   Chlamydophila pneumoniae NOT DETECTED NOT DETECTED Final   Mycoplasma pneumoniae NOT DETECTED NOT DETECTED Final    Comment: Performed at [facility], [address], [place], [place] [address]         Radiology Studies: CT Angio Chest Pulmonary Embolism (PE) W or WO Contrast Result Date: 12/23/2023 CLINICAL DATA:  Pulmonary embolism suspected.  High probability. EXAM: CT ANGIOGRAPHY CHEST WITH CONTRAST TECHNIQUE: Multidetector CT imaging of the chest was performed using the standard protocol during bolus administration of intravenous contrast. Multiplanar CT image reconstructions and MIPs were obtained to evaluate the vascular anatomy.  RADIATION DOSE REDUCTION: This exam was performed according to the departmental dose-optimization program which includes automated exposure control, adjustment of the mA and/or kV according to patient size and/or use of iterative reconstruction technique. CONTRAST:  75mL OMNIPAQUE  IOHEXOL  350 MG/ML SOLN COMPARISON:  10/09/2015 FINDINGS: Cardiovascular: Exam detail diminished due to suboptimal pulmonary arterial opacification and respiratory motion artifact. Within this limitation, there is no sign of central obstructing pulmonary embolus to the level of the proximal segmental pulmonary arteries bilaterally. Signs of PA hypertension noted. The main pulmonary artery measures 4.5 cm. Mild cardiac enlargement. No significant aortic or coronary artery calcifications. No pericardial effusion. Mediastinum/Nodes: Peripherally calcified nodule in the left lobe of thyroid gland measures 1.3 cm, image 4/5. Not clinically significant; no follow-up imaging recommended(Ref: J Am Coll Radiol. 2015 Feb;12(2): 143-50). Decreased AP diameter of the trachea is noted. Esophagus  demonstrates no significant findings. No mediastinal adenopathy. Right hilar lymph node measures 1.3 cm, image 40/5. There is a left hilar node measuring 1.1 cm, image 55/4. Lungs/Pleura: Pulmonary parenchymal detail is diminished due to respiratory motion artifact. No significant pleural effusion, airspace consolidation or pneumothorax. Ground-glass attenuation is noted within the upper and lower lung zones with scattered areas of air trapping. Calcified granuloma noted in the right upper lobe. Upper Abdomen: No acute abnormality. Gallstones are suspected. There is a large, ventral abdominal wall hernia containing loops of large and small bowel. This is only partially visualized. Musculoskeletal: No fractures seen.  Thoracic spondylosis. Review of the MIP images confirms the above findings. IMPRESSION: 1. Exam detail diminished due to suboptimal pulmonary  arterial opacification and respiratory motion artifact. Within this limitation, there is no sign of central obstructing pulmonary embolus to the level of the proximal segmental pulmonary arteries bilaterally. 2. Signs of PA hypertension. 3. Ground-glass attenuation is noted within the upper and lower lung zones with scattered areas of air trapping. Findings are nonspecific but may reflect pulmonary edema or atypical infection. 4. Air trapping noted within both lungs suggesting small airways disease. 5. 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 6. Large, ventral abdominal wall hernia containing loops of large and small bowel. This is only partially visualized. 7. Cholelithiasis. Electronically Signed   By: Waddell Calk M.D.   On: 12/23/2023 06:52   DG Chest Portable 1 View Result Date: 12/22/2023 CLINICAL DATA:  sob EXAM: PORTABLE CHEST - 1 VIEW COMPARISON:  April 10, 2016 FINDINGS: Elevation of the right hemidiaphragm. Diffuse bilateral perihilar interstitial opacities. No focal airspace consolidation, pleural effusion, or pneumothorax. Mild cardiomegaly.No acute fracture or destructive lesion. Multilevel thoracic osteophytosis. IMPRESSION: Cardiomegaly with findings of either interstitial edema or atypical/viral infection. Electronically Signed   By: Rogelia Myers M.D.   On: 12/22/2023 20:32        Scheduled Meds:  arformoterol   15 mcg Nebulization BID   budesonide  (PULMICORT ) nebulizer solution  0.25 mg Nebulization BID   clobetasol  cream  1 Application Topical BID   enoxaparin  (LOVENOX ) injection  70 mg Subcutaneous Q24H   famotidine   20 mg Oral BID   furosemide   40 mg Oral BID   guaiFENesin   600 mg Oral BID   insulin  aspart  0-15 Units Subcutaneous TID WC   insulin  aspart  0-5 Units Subcutaneous QHS   ipratropium-albuterol   3 mL Nebulization TID   predniSONE   40 mg Oral Q breakfast   sodium chloride  flush  3 mL Intravenous Q12H   spironolactone   25 mg  Oral Daily   Continuous Infusions:  cefTRIAXone  (ROCEPHIN )  IV 2 g (12/24/23 0229)   doxycycline  (VIBRAMYCIN ) IV 100 mg (12/23/23 2151)     LOS: 1 day    Time spent: 35 minutes    Julyan Gales A Oluwatomisin Hustead, MD [facility]   If 7PM-7AM, please contact night-coverage [email or web address]  12/24/2023, 7:14 AM

## 2023-12-25 ENCOUNTER — Telehealth (HOSPITAL_BASED_OUTPATIENT_CLINIC_OR_DEPARTMENT_OTHER): Payer: Self-pay | Admitting: *Deleted

## 2023-12-25 ENCOUNTER — Other Ambulatory Visit (HOSPITAL_COMMUNITY): Payer: Self-pay

## 2023-12-25 LAB — CBC
HCT: 36.9 % (ref 36.0–46.0)
Hemoglobin: 10.4 g/dL — ABNORMAL LOW (ref 12.0–15.0)
MCH: 22.6 pg — ABNORMAL LOW (ref 26.0–34.0)
MCHC: 28.2 g/dL — ABNORMAL LOW (ref 30.0–36.0)
MCV: 80.2 fL (ref 80.0–100.0)
Platelets: 269 10*3/uL (ref 150–400)
RBC: 4.6 MIL/uL (ref 3.87–5.11)
RDW: 16.2 % — ABNORMAL HIGH (ref 11.5–15.5)
WBC: 10.4 10*3/uL (ref 4.0–10.5)
nRBC: 0 % (ref 0.0–0.2)

## 2023-12-25 LAB — BASIC METABOLIC PANEL WITH GFR
Anion gap: 7 (ref 5–15)
BUN: 12 mg/dL (ref 6–20)
CO2: 36 mmol/L — ABNORMAL HIGH (ref 22–32)
Calcium: 8.3 mg/dL — ABNORMAL LOW (ref 8.9–10.3)
Chloride: 95 mmol/L — ABNORMAL LOW (ref 98–111)
Creatinine, Ser: 0.48 mg/dL (ref 0.44–1.00)
GFR, Estimated: >60 mL/min (ref >60)
Glucose, Bld: 105 mg/dL — ABNORMAL HIGH (ref 70–99)
Potassium: 3.6 mmol/L (ref 3.5–5.1)
Sodium: 138 mmol/L (ref 135–145)

## 2023-12-25 LAB — GLUCOSE, CAPILLARY
Glucose-Capillary: 107 mg/dL — ABNORMAL HIGH (ref 70–99)
Glucose-Capillary: 116 mg/dL — ABNORMAL HIGH (ref 70–99)

## 2023-12-25 MED ORDER — POLYETHYLENE GLYCOL 3350 17 GM/SCOOP PO POWD
17.0000 g | Freq: Every day | ORAL | 0 refills | Status: DC
  Filled 2023-12-25: qty 238, 14d supply, fill #0

## 2023-12-25 MED ORDER — IPRATROPIUM-ALBUTEROL 0.5-2.5 (3) MG/3ML IN SOLN
3.0000 mL | Freq: Three times a day (TID) | RESPIRATORY_TRACT | 1 refills | Status: DC
  Filled 2023-12-25: qty 270, 30d supply, fill #0

## 2023-12-25 MED ORDER — GUAIFENESIN ER 600 MG PO TB12
600.0000 mg | ORAL_TABLET | Freq: Two times a day (BID) | ORAL | 0 refills | Status: AC
  Filled 2023-12-25: qty 30, 15d supply, fill #0

## 2023-12-25 MED ORDER — POTASSIUM CHLORIDE CRYS ER 10 MEQ PO TBCR
20.0000 meq | EXTENDED_RELEASE_TABLET | Freq: Every day | ORAL | 1 refills | Status: AC
Start: 2023-12-25 — End: ?
  Filled 2023-12-25: qty 60, 30d supply, fill #0

## 2023-12-25 MED ORDER — SPIRONOLACTONE 25 MG PO TABS
25.0000 mg | ORAL_TABLET | Freq: Every day | ORAL | 1 refills | Status: AC
  Filled 2023-12-25: qty 30, 30d supply, fill #0

## 2023-12-25 MED ORDER — FUROSEMIDE 40 MG PO TABS
40.0000 mg | ORAL_TABLET | Freq: Two times a day (BID) | ORAL | 1 refills | Status: AC
  Filled 2023-12-25: qty 60, 30d supply, fill #0

## 2023-12-25 MED ORDER — ALBUTEROL SULFATE HFA 108 (90 BASE) MCG/ACT IN AERS
2.0000 | INHALATION_SPRAY | RESPIRATORY_TRACT | 3 refills | Status: DC | PRN
  Filled 2023-12-25: qty 18, 30d supply, fill #0

## 2023-12-25 MED ORDER — FAMOTIDINE 20 MG PO TABS
20.0000 mg | ORAL_TABLET | Freq: Two times a day (BID) | ORAL | 1 refills | Status: DC
  Filled 2023-12-25: qty 60, 30d supply, fill #0

## 2023-12-25 MED ORDER — BUDESONIDE-FORMOTEROL FUMARATE 80-4.5 MCG/ACT IN AERO
2.0000 | INHALATION_SPRAY | Freq: Two times a day (BID) | RESPIRATORY_TRACT | 12 refills | Status: DC
  Filled 2023-12-25: qty 10.2, 30d supply, fill #0

## 2023-12-25 MED ORDER — DOXYCYCLINE HYCLATE 100 MG PO TABS
100.0000 mg | ORAL_TABLET | Freq: Two times a day (BID) | ORAL | 0 refills | Status: AC
  Filled 2023-12-25: qty 6, 3d supply, fill #0

## 2023-12-25 MED ORDER — PREDNISONE 20 MG PO TABS
ORAL_TABLET | ORAL | 1 refills | Status: AC
  Filled 2023-12-25: qty 60, 13d supply, fill #0

## 2023-12-25 MED ORDER — CLOBETASOL PROPIONATE 0.05 % EX CREA
1.0000 | TOPICAL_CREAM | Freq: Two times a day (BID) | CUTANEOUS | 2 refills | Status: DC
  Filled 2023-12-25: qty 30, 30d supply, fill #0

## 2023-12-25 NOTE — Evaluation (Signed)
 Physical Therapy Evaluation Patient Details Name: Donna Hall MRN: [id number] DOB: 1986-04-16 Today's Date: 12/25/2023  History of Present Illness  38 y.o. female adm 6/28 with worsening shortness of breath, increased cough and sputum production. On home O2. Dx - Acute on Chronic Hypoxic Hypercapnic Respiratory Failure. PMH: SLE, psoriatic arthritis, ILD, OSA, BMI 60, chronic HFpEF, chronic hypoxic and hypercarbic respiratory failure.  Clinical Impression  Pt admitted with above diagnosis. Requires supervision with mobility today utilizing RW for support. Easily fatigued and dyspneic with mobility. Spo2 96% at rest on 3L with HR 97. Ambulating on 3L Spo2 92% and HR 125. Ambulating on 2L, SpO2 85%. Educated on energy conservation, symptom awareness, and follow-up recs to maximize functional independence and functional capacity. Pt currently with functional limitations due to the deficits listed below (see PT Problem List). Pt will benefit from acute skilled PT to increase their independence and safety with mobility to allow discharge.           If plan is discharge home, recommend the following: A little help with walking and/or transfers;A little help with bathing/dressing/bathroom;Assistance with cooking/housework;Assist for transportation;Help with stairs or ramp for entrance   Can travel by private vehicle        Equipment Recommendations Rollator (4 wheels)  Recommendations for Other Services       Functional Status Assessment Patient has had a recent decline in their functional status and demonstrates the ability to make significant improvements in function in a reasonable and predictable amount of time.     Precautions / Restrictions Precautions Precautions: Fall Recall of Precautions/Restrictions: Intact Precaution/Restrictions Comments: Watch O2 sats and HR Restrictions Weight Bearing Restrictions Per Provider Order: No      Mobility  Bed Mobility                General bed mobility comments: sitting in recliner.    Transfers Overall transfer level: Needs assistance Equipment used: Rolling walker (2 wheels) Transfers: Sit to/from Stand, Bed to chair/wheelchair/BSC Sit to Stand: Supervision           General transfer comment: Supervision for safety performed from various surfaces, no physical assist. managed lines/leads O2 for pt.    Ambulation/Gait Ambulation/Gait assistance: Supervision Gait Distance (Feet): 55 Feet (+15) Assistive device: Rolling walker (2 wheels) Gait Pattern/deviations: Step-through pattern, Decreased stride length, Antalgic Gait velocity: dec Gait velocity interpretation: <1.31 ft/sec, indicative of household ambulator   General Gait Details: Slow, antalgic with increased lateral sway but stable with RW for support. Educated on use for maximum stability. No buckling noted. First bout 15 feet, fatigued and needed to sit. Second bout tolerated up to 55 feet before sitting but required approx 3 standing rest breaks. Educated on energy conservation and breathing techniques. SpO2 85% on 2L, 92% on 3L. HR 125.  Stairs            Wheelchair Mobility     Tilt Bed    Modified Rankin (Stroke Patients Only)       Balance Overall balance assessment: Mild deficits observed, not formally tested                                           Pertinent Vitals/Pain Pain Assessment Pain Assessment: No/denies pain    Home Living Family/patient expects to be discharged to:: Private residence Living Arrangements: Children Available Help at Discharge: Family;Available 24 hours/day Type of  Home: House Home Access: Stairs to enter Entrance Stairs-Rails: None Entrance Stairs-Number of Steps: 1 Alternate Level Stairs-Number of Steps: 2 steps to the kitchen, otherwise one level Home Layout: Multi-level Home Equipment: None      Prior Function Prior Level of Function : Needs assist        Physical Assist : ADLs (physical)   ADLs (physical): Bathing;Dressing;IADLs   ADLs Comments: Oldest daughter assists as needed for ADL,iADL.  Patient does not drive.     Extremity/Trunk Assessment   Upper Extremity Assessment Upper Extremity Assessment: Defer to OT evaluation    Lower Extremity Assessment Lower Extremity Assessment: Generalized weakness    Cervical / Trunk Assessment Cervical / Trunk Assessment: Normal  Communication   Communication Communication: Other (comment) Factors Affecting Communication: Non - English speaking, interpreter not available    Cognition Arousal: Alert Behavior During Therapy: WFL for tasks assessed/performed   PT - Cognitive impairments: No apparent impairments                         Following commands: Intact       Cueing Cueing Techniques: Verbal cues, Gestural cues     General Comments General comments (skin integrity, edema, etc.): Spo2 96% at rest on 3L. HR 97. Ambulating on 3L 92% HR 125. Ambulating on 2L 85%.    Exercises     Assessment/Plan    PT Assessment Patient needs continued PT services  PT Problem List Decreased strength;Decreased activity tolerance;Decreased balance;Decreased mobility;Decreased range of motion;Decreased knowledge of use of DME;Cardiopulmonary status limiting activity;Obesity       PT Treatment Interventions DME instruction;Gait training;Stair training;Functional mobility training;Therapeutic activities;Therapeutic exercise;Balance training;Neuromuscular re-education;Patient/family education    PT Goals (Current goals can be found in the Care Plan section)  Acute Rehab PT Goals Patient Stated Goal: breath better, improve strength PT Goal Formulation: With patient Time For Goal Achievement: 01/08/24 Potential to Achieve Goals: Fair    Frequency Min 3X/week     Co-evaluation               AM-PAC PT 6 Clicks Mobility  Outcome Measure Help needed turning from your  back to your side while in a flat bed without using bedrails?: A Little Help needed moving from lying on your back to sitting on the side of a flat bed without using bedrails?: A Little Help needed moving to and from a bed to a chair (including a wheelchair)?: A Little Help needed standing up from a chair using your arms (e.g., wheelchair or bedside chair)?: A Little Help needed to walk in hospital room?: A Little Help needed climbing 3-5 steps with a railing? : A Little 6 Click Score: 18    End of Session Equipment Utilized During Treatment: Gait belt;Oxygen  Activity Tolerance: Patient tolerated treatment well;Patient limited by fatigue Patient left: in chair;with call bell/phone within reach Nurse Communication: Mobility status PT Visit Diagnosis: Unsteadiness on feet (R26.81);Other abnormalities of gait and mobility (R26.89);Muscle weakness (generalized) (M62.81);Difficulty in walking, not elsewhere classified (R26.2)    Time: 9046-8987 PT Time Calculation (min) (ACUTE ONLY): 19 min   Charges:   PT Evaluation $PT Eval Low Complexity: 1 Low   PT General Charges $$ ACUTE PT VISIT: 1 Visit         Leontine Roads, PT, DPT [facility]  Rehabilitation Services Physical Therapist Office: [phone] Website: [email or web address]   Leontine GORMAN Roads 12/25/2023, 11:31 AM

## 2023-12-25 NOTE — Evaluation (Signed)
 Occupational Therapy Evaluation Patient Details Name: Donna Hall MRN: [id number] DOB: 1985-12-26 Today's Date: 12/25/2023   History of Present Illness   38 y.o. female adm 6/28 with medical history significant for SLE, psoriatic arthritis, ILD, OSA, BMI 60, chronic HFpEF, chronic hypoxic and hypercarbic respiratory failure, presented with worsening shortness of breath, increased cough and sputum production.  On home O2.     Clinical Impressions Patient admitted for the diagnosis above.  PTA she lives at home with her children, who do assist her with ADL, iADL and community mobility.  She uses home O2, and no longer has any DME since moving to [place] from [place].  Patient presents with poor activity tolerance and SOB with activity, but remains largely supervision for in room mobility,stand grooming, and toileting without an AD.  OT can continue efforts in the acute setting to address deficits, but given family support, no post acute OT is anticipated.       If plan is discharge home, recommend the following:   A little help with bathing/dressing/bathroom;Assistance with cooking/housework;Help with stairs or ramp for entrance;Assist for transportation     Functional Status Assessment   Patient has had a recent decline in their functional status and demonstrates the ability to make significant improvements in function in a reasonable and predictable amount of time.     Equipment Recommendations   Tub/shower bench     Recommendations for Other Services         Precautions/Restrictions   Precautions Precautions: Fall Precaution/Restrictions Comments: Watch O2 sats and HR Restrictions Weight Bearing Restrictions Per Provider Order: No     Mobility Bed Mobility               General bed mobility comments: Sitting EOB Patient Response: Cooperative  Transfers Overall transfer level: Needs assistance Equipment used: None Transfers: Sit to/from Stand, Bed to  chair/wheelchair/BSC Sit to Stand: Supervision     Step pivot transfers: Supervision            Balance Overall balance assessment: Mild deficits observed, not formally tested                                         ADL either performed or assessed with clinical judgement   ADL       Grooming: Wash/dry hands;Oral care;Supervision/safety;Standing               Lower Body Dressing: Minimal assistance;Sit to/from stand   Toilet Transfer: Retail banker;Ambulation                   Vision Patient Visual Report: No change from baseline       Perception Perception: Not tested       Praxis Praxis: Not tested       Pertinent Vitals/Pain Pain Assessment Pain Assessment: No/denies pain     Extremity/Trunk Assessment Upper Extremity Assessment Upper Extremity Assessment: Overall WFL for tasks assessed   Lower Extremity Assessment Lower Extremity Assessment: Defer to PT evaluation   Cervical / Trunk Assessment Cervical / Trunk Assessment: Normal   Communication Communication Communication: Other (comment) Factors Affecting Communication: Non - English speaking, interpreter not available   Cognition Arousal: Alert Behavior During Therapy: WFL for tasks assessed/performed Cognition: No apparent impairments  Following commands: Intact       Cueing  General Comments   Cueing Techniques: Verbal cues;Gestural cues   HR to 121   Exercises     Shoulder Instructions      Home Living Family/patient expects to be discharged to:: Private residence Living Arrangements: Children Available Help at Discharge: Family;Available 24 hours/day Type of Home: House Home Access: Stairs to enter [employer] of Steps: 1 Entrance Stairs-Rails: None Home Layout: Multi-level Alternate Level Stairs-Number of Steps: 2 steps to the kitchen, otherwise one level   Bathroom  Shower/Tub: Chief Strategy Officer: Standard     Home Equipment: None          Prior Functioning/Environment Prior Level of Function : Needs assist       Physical Assist : ADLs (physical)   ADLs (physical): Bathing;Dressing;IADLs   ADLs Comments: Oldest daughter assists as needed for ADL,iADL.  Patient does not drive.    OT Problem List: Decreased activity tolerance   OT Treatment/Interventions: Self-care/ADL training;Therapeutic activities;Balance training;DME and/or AE instruction      OT Goals(Current goals can be found in the care plan section)   Acute Rehab OT Goals Patient Stated Goal: Return home OT Goal Formulation: With patient Time For Goal Achievement: 01/08/24 Potential to Achieve Goals: Good ADL Goals Pt Will Perform Grooming: with modified independence;standing Pt Will Transfer to Toilet: with modified independence;regular height toilet;ambulating   OT Frequency:  Min 2X/week    Co-evaluation              AM-PAC OT 6 Clicks Daily Activity     Outcome Measure Help from another person eating meals?: None Help from another person taking care of personal grooming?: A Little Help from another person toileting, which includes using toliet, bedpan, or urinal?: A Little Help from another person bathing (including washing, rinsing, drying)?: A Little Help from another person to put on and taking off regular upper body clothing?: None Help from another person to put on and taking off regular lower body clothing?: A Little 6 Click Score: 20   End of Session Equipment Utilized During Treatment: Oxygen   Activity Tolerance: Patient limited by fatigue Patient left: in chair;with call bell/phone within reach  OT Visit Diagnosis: Unsteadiness on feet (R26.81)                Time: 9166-9144 OT Time Calculation (min): 22 min Charges:  OT General Charges $OT Visit: 1 Visit OT Evaluation $OT Eval Moderate Complexity: 1  Mod  12/25/2023  RP, OTR/L  Acute Rehabilitation Services  Office:  [phone]   Charlie JONETTA Halsted 12/25/2023, 9:04 AM

## 2023-12-25 NOTE — Discharge Summary (Signed)
 Physician Discharge Summary   Patient: Donna Hall MRN: [id number] DOB: 1986/01/05  Admit date:     12/22/2023  Discharge date: 12/25/23  Discharge Physician: Owen DELENA Lore   PCP: de [place], Raymond J, MD   Recommendations at discharge:    Needs [facility]met to follow renal function and electrolytes.  Needs Follow up for chronic medical problems.   Discharge Diagnoses: Principal Problem:   Respiratory failure with hypoxia and hypercapnia (HCC) Active Problems:   INTERSTITIAL LUNG DISEASE   Rheumatoid arthritis (HCC)   Systemic lupus erythematosus (HCC)   Moderate obstructive sleep apnea   Chronic heart failure with preserved ejection fraction (HFpEF) (HCC)   Prolonged QT interval  Resolved Problems:   * No resolved hospital problems. *  Hospital Course: 38 year old for fnd, psoriatic arthritis, ILD, OSA, BMI 60, chronic heart failure preserved ejection fraction, chronic hypoxic and hypercarbic respiratory failure presented with worsening shortness of breath, increased cough and sputum production and pain in her upper back with deep breath.  Patient ran out of medication for 2 months.  She presents with worsening shortness of breath.  evaluation in the ed:  influenza covid rsv negative, chest x-ray cardiomegaly and diffuse perihilar ESRD showed opacities bilaterally.  Patient admitted for acute on chronic respiratory failure.    Assessment and Plan: 1-Acute on Chronic Hypoxic Hypercapnic Respiratory Failure ILD -Patient presented with worsening shortness of breath, BNP 36, procalcitonin less than 0.10, white blood cell 10, respiratory panel negative.  -CT angio chest: Negative for central PE, signs of pulmonary hypertension,Ground-glass attenuation is noted within the upper and lower lung zones with scattered areas of air trapping. Findings are nonspecific but may reflect pulmonary edema or atypical infection. Air trapping noted within both lungs suggesting small airways  disease. - Continue with IV Solu-Medrol --change to prednisone , DuoNebs, scheduled Lasix  - Continue ceftriaxone  and Doxy -Continue with Brovana , Pulmicort .  Feeling better, discharge on prednisone  taper, diuretics, antibiotics.    Chronic heart failure preserved ejection fraction: - Continue with Lasix  and spironolactone    SLE, psoriasis arthritis -on Prednisone  10 mg daily    OSA: -Continue CPAP/  -she will need Oxygen  at discharge, her oxygen  tank is not working.    Prolong Qt: -Replete electrolyte.    Morbid Obesity Needs life style modification.         Consultants: None Procedures performed: none  Disposition: Home Diet recommendation:  Discharge Diet Orders (From admission, onward)     Start     Ordered   12/25/23 0000  Diet - low sodium heart healthy        12/25/23 0905           Cardiac diet DISCHARGE MEDICATION: Allergies as of 12/25/2023   No Known Allergies      Medication List     STOP taking these medications    folic acid  1 MG tablet Commonly known as: FOLVITE        TAKE these medications    acetaminophen  500 MG tablet Commonly known as: TYLENOL  Take 1,000 mg by mouth every 6 (six) hours as needed.   albuterol  108 (90 Base) MCG/ACT inhaler Commonly known as: VENTOLIN  HFA Inhale 2 puffs into the lungs every 4 (four) hours as needed for wheezing or shortness of breath.   budesonide -formoterol  80-4.5 MCG/ACT inhaler Commonly known as: Symbicort Inhale 2 puffs into the lungs 2 (two) times daily.   clobetasol  cream 0.05 % Commonly known as: TEMOVATE  Aplicar 1 aplicacin tpicamente 2 veces al da. (Apply 1 Application topically  2 (two) times daily.)   doxycycline  100 MG tablet Commonly known as: VIBRA -TABS Take 1 tablet (100 mg total) by mouth every 12 (twelve) hours for 3 days.   famotidine  20 MG tablet Commonly known as: PEPCID  Tome 1 tableta (20mg ) por va oral dos veces al da. (Take 1 tablet (20 mg total) by mouth 2  (two) times daily.)   furosemide  40 MG tablet Commonly known as: LASIX  Tome 1 tableta (40mg ) por va oral dos veces al da. (Take 1 tablet (40 mg total) by mouth 2 (two) times daily.)   guaiFENesin  600 MG 12 hr tablet Commonly known as: MUCINEX  Take 1 tablet (600 mg total) by mouth 2 (two) times daily.   ipratropium-albuterol  0.5-2.5 (3) MG/3ML Soln Commonly known as: DUONEB Take 3 mLs by nebulization 3 (three) times daily.   polyethylene glycol 17 g packet Commonly known as: MiraLax  Take 17 g by mouth daily.   potassium chloride  10 MEQ CR capsule Commonly known as: MICRO-K  Take 2 capsules (20 mEq total) by mouth daily. What changed: how much to take   predniSONE  20 MG tablet Commonly known as: DELTASONE  Take 40 mg daily for 4 days then 30 mag daily for 3 days then 20 mg daily for 3 days then 10 mg daily What changed:  how much to take how to take this when to take this additional instructions   spironolactone  25 MG tablet Commonly known as: ALDACTONE  Tome 1 tableta (25 mg en total) por va oral diariamente. (Take 1 tablet (25 mg total) by mouth daily.)               Durable Medical Equipment  (From admission, onward)           Start     Ordered   12/25/23 0906  For home use only DME Nebulizer machine  Once       Question Answer Comment  Patient needs a nebulizer to treat with the following condition ILD (interstitial lung disease) (HCC)   Length of Need Lifetime   Additional equipment included Administration kit      12/25/23 0906   12/24/23 1019  For home use only DME oxygen   Once       Question Answer Comment  Length of Need Lifetime   Mode or (Route) Nasal cannula   Liters per Minute 4   Frequency Continuous (stationary and portable oxygen  unit needed)   Oxygen  delivery system Gas      12/24/23 1018            Follow-up Information     [facility]  Follow up.   Why: home oxygen  Contact information: [address] [place] [address] [phone]                Discharge Exam: Donna Hall   12/22/23 1949  Weight: (!) 145 kg   General; NAD  Condition at discharge: stable  The results of significant diagnostics from this hospitalization (including imaging, microbiology, ancillary and laboratory) are listed below for reference.   Imaging Studies: CT Angio Chest Pulmonary Embolism (PE) W or WO Contrast Result Date: 12/23/2023 CLINICAL DATA:  Pulmonary embolism suspected.  High probability. EXAM: CT ANGIOGRAPHY CHEST WITH CONTRAST TECHNIQUE: Multidetector CT imaging of the chest was performed using the standard protocol during bolus administration of intravenous contrast. Multiplanar CT image reconstructions and MIPs were obtained to evaluate the vascular anatomy. RADIATION DOSE REDUCTION: This exam was performed according to the departmental dose-optimization program which includes automated  exposure control, adjustment of the mA and/or kV according to patient size and/or use of iterative reconstruction technique. CONTRAST:  75mL OMNIPAQUE  IOHEXOL  350 MG/ML SOLN COMPARISON:  10/09/2015 FINDINGS: Cardiovascular: Exam detail diminished due to suboptimal pulmonary arterial opacification and respiratory motion artifact. Within this limitation, there is no sign of central obstructing pulmonary embolus to the level of the proximal segmental pulmonary arteries bilaterally. Signs of PA hypertension noted. The main pulmonary artery measures 4.5 cm. Mild cardiac enlargement. No significant aortic or coronary artery calcifications. No pericardial effusion. Mediastinum/Nodes: Peripherally calcified nodule in the left lobe of thyroid gland measures 1.3 cm, image 4/5. Not clinically significant; no follow-up imaging recommended(Ref: J Am Coll Radiol. 2015 Feb;12(2): 143-50). Decreased AP diameter of the trachea is noted. Esophagus demonstrates no significant findings. No mediastinal adenopathy. Right hilar lymph node  measures 1.3 cm, image 40/5. There is a left hilar node measuring 1.1 cm, image 55/4. Lungs/Pleura: Pulmonary parenchymal detail is diminished due to respiratory motion artifact. No significant pleural effusion, airspace consolidation or pneumothorax. Ground-glass attenuation is noted within the upper and lower lung zones with scattered areas of air trapping. Calcified granuloma noted in the right upper lobe. Upper Abdomen: No acute abnormality. Gallstones are suspected. There is a large, ventral abdominal wall hernia containing loops of large and small bowel. This is only partially visualized. Musculoskeletal: No fractures seen.  Thoracic spondylosis. Review of the MIP images confirms the above findings. IMPRESSION: 1. Exam detail diminished due to suboptimal pulmonary arterial opacification and respiratory motion artifact. Within this limitation, there is no sign of central obstructing pulmonary embolus to the level of the proximal segmental pulmonary arteries bilaterally. 2. Signs of PA hypertension. 3. Ground-glass attenuation is noted within the upper and lower lung zones with scattered areas of air trapping. Findings are nonspecific but may reflect pulmonary edema or atypical infection. 4. Air trapping noted within both lungs suggesting small airways disease. 5. 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 6. Large, ventral abdominal wall hernia containing loops of large and small bowel. This is only partially visualized. 7. Cholelithiasis. Electronically Signed   By: Waddell Calk M.D.   On: 12/23/2023 06:52   DG Chest Portable 1 View Result Date: 12/22/2023 CLINICAL DATA:  sob EXAM: PORTABLE CHEST - 1 VIEW COMPARISON:  April 10, 2016 FINDINGS: Elevation of the right hemidiaphragm. Diffuse bilateral perihilar interstitial opacities. No focal airspace consolidation, pleural effusion, or pneumothorax. Mild cardiomegaly.No acute fracture or destructive lesion. Multilevel thoracic  osteophytosis. IMPRESSION: Cardiomegaly with findings of either interstitial edema or atypical/viral infection. Electronically Signed   By: Rogelia Myers M.D.   On: 12/22/2023 20:32    Microbiology: Results for orders placed or performed during the hospital encounter of 12/22/23  Resp panel by RT-PCR (RSV, Flu A&B, Covid) Anterior Nasal Swab     Status: None   Collection Time: 12/22/23  8:05 PM   Specimen: Anterior Nasal Swab  Result Value Ref Range Status   SARS Coronavirus 2 by RT PCR NEGATIVE NEGATIVE Final   Influenza A by PCR NEGATIVE NEGATIVE Final   Influenza B by PCR NEGATIVE NEGATIVE Final    Comment: (NOTE) The Xpert Xpress SARS-CoV-2/FLU/RSV plus assay is intended as an aid in the diagnosis of influenza from Nasopharyngeal swab specimens and should not be used as a sole basis for treatment. Nasal washings and aspirates are unacceptable for Xpert Xpress SARS-CoV-2/FLU/RSV testing.  Fact Sheet for Patients: BloggerCourse.com  Fact Sheet for Healthcare Providers: SeriousBroker.it  This test is not yet  approved or cleared by the [place]  FDA and has been authorized for detection and/or diagnosis of SARS-CoV-2 by FDA under an Emergency Use Authorization (EUA). This EUA will remain in effect (meaning this test can be used) for the duration of the COVID-19 declaration under Section 564(b)(1) of the Act, 21 U.S.C. section 360bbb-3(b)(1), unless the authorization is terminated or revoked.     Resp Syncytial Virus by PCR NEGATIVE NEGATIVE Final    Comment: (NOTE) Fact Sheet for Patients: BloggerCourse.com  Fact Sheet for Healthcare Providers: SeriousBroker.it  This test is not yet approved or cleared by the [place]  FDA and has been authorized for detection and/or diagnosis of SARS-CoV-2 by FDA under an Emergency Use Authorization (EUA). This EUA will remain in  effect (meaning this test can be used) for the duration of the COVID-19 declaration under Section 564(b)(1) of the Act, 21 U.S.C. section 360bbb-3(b)(1), unless the authorization is terminated or revoked.  Performed at [facility], [address] [place], [place] [address]   MRSA Next Gen by PCR, Nasal     Status: None   Collection Time: 12/23/23  2:05 AM   Specimen: Nasal Mucosa; Nasal Swab  Result Value Ref Range Status   MRSA by PCR Next Gen NOT DETECTED NOT DETECTED Final    Comment: (NOTE) The GeneXpert MRSA Assay (FDA approved for NASAL specimens only), is one component of a comprehensive MRSA colonization surveillance program. It is not intended to diagnose MRSA infection nor to guide or monitor treatment for MRSA infections. Test performance is not FDA approved in patients less than 60 years old. Performed at [facility], [address] [place], [place] [address]   Respiratory (~20 pathogens) panel by PCR     Status: None   Collection Time: 12/23/23  2:15 AM   Specimen: Nasopharyngeal Swab; Respiratory  Result Value Ref Range Status   Adenovirus NOT DETECTED NOT DETECTED Final   Coronavirus 229E NOT DETECTED NOT DETECTED Final    Comment: (NOTE) The Coronavirus on the Respiratory Panel, DOES NOT test for the novel  Coronavirus (2019 nCoV)    Coronavirus HKU1 NOT DETECTED NOT DETECTED Final   Coronavirus NL63 NOT DETECTED NOT DETECTED Final   Coronavirus OC43 NOT DETECTED NOT DETECTED Final   Metapneumovirus NOT DETECTED NOT DETECTED Final   Rhinovirus / Enterovirus NOT DETECTED NOT DETECTED Final   Influenza A NOT DETECTED NOT DETECTED Final   Influenza B NOT DETECTED NOT DETECTED Final   Parainfluenza Virus 1 NOT DETECTED NOT DETECTED Final   Parainfluenza Virus 2 NOT DETECTED NOT DETECTED Final   Parainfluenza Virus 3 NOT DETECTED NOT DETECTED Final   Parainfluenza Virus 4 NOT DETECTED NOT DETECTED Final   Respiratory Syncytial Virus NOT DETECTED  NOT DETECTED Final   Bordetella pertussis NOT DETECTED NOT DETECTED Final   Bordetella Parapertussis NOT DETECTED NOT DETECTED Final   Chlamydophila pneumoniae NOT DETECTED NOT DETECTED Final   Mycoplasma pneumoniae NOT DETECTED NOT DETECTED Final    Comment: Performed at [facility], [address], [place], [place] [address]    Labs: CBC: Recent Labs  Lab 12/22/23 1958 12/23/23 0300 12/24/23 0229 12/25/23 0347  WBC 10.4 8.9 10.8* 10.4  NEUTROABS 6.9  --   --   --   HGB 11.6* 11.1* 10.5* 10.4*  HCT 40.9 39.1 36.5 36.9  MCV 80.2 79.8* 80.6 80.2  PLT 293 245 251 269   Basic Metabolic Panel: Recent Labs  Lab 12/22/23 1958 12/23/23  0300 12/24/23 0229 12/25/23 0347  NA 142 140 138 138  K 3.6 4.0 4.2 3.6  CL 106 104 99 95*  CO2 28 27 29  36*  GLUCOSE 118* 206* 162* 105*  BUN 16 15 13 12   CREATININE 0.70 0.52 0.56 0.48  CALCIUM 9.4 9.2 8.6* 8.3*  MG  --  2.5*  --   --    Liver Function Tests: Recent Labs  Lab 12/22/23 1958  AST 44*  ALT 31  ALKPHOS 96  BILITOT 0.4  PROT 8.0  ALBUMIN 3.6   CBG: Recent Labs  Lab 12/24/23 0614 12/24/23 1055 12/24/23 1611 12/24/23 2128 12/25/23 0618  GLUCAP 167* 172* 135* 131* 107*    Discharge time spent: greater than 30 minutes.  Signed: Owen DELENA Lore, MD [facility] 12/25/2023

## 2023-12-25 NOTE — Plan of Care (Signed)

## 2023-12-25 NOTE — Telephone Encounter (Signed)
 Copied from CRM [id number]. Topic: Appointments - Scheduling Inquiry for Clinic >> Dec 25, 2023  9:34 AM Selinda RAMAN wrote: Reason for CRM: Kristie Case Manager at [facility] called in to schedule a hospital follow up for the patient. looking I saw no availability until the later part of August. I know the providers absolutely want and need to see the patient within 2 weeks of being discharged. I spoke with Randall and she told me to send a [employer] scheduling CRM to admin to make sure the patient can be worked in as soon as possible. I told Kristie someone will be contacted the patient as soon as possible. She said thank you and please do with an interpreter. Please assist patient further.

## 2023-12-25 NOTE — Progress Notes (Signed)
 Went over discharge paperwork with patient. All questions answered. PIV/telemetry removed. All belongs at bedside.

## 2023-12-25 NOTE — TOC Transition Note (Signed)
 Transition of Care [facility]) - Discharge Note   Patient Details  Name: Donna Hall MRN: [id number] Date of Birth: 1986/01/31  Transition of Care [facility]) CM/SW Contact:  Roxie KANDICE Stain, RN Phone Number: 12/25/2023, 12:34 PM   Clinical Narrative:    Patient stable for discharge.  LOG done for Nebulizer machine from [employer]. DME will be delivered to the patient's home.  [employer] delivered portable 02 tank to the room and will come to patient's home today to fix the concentrator and refill portable tanks.  PCP apt on AVS.  FC met with patient for medicaid application.      Barriers to Discharge: Continued Medical Work up   Patient Goals and CMS Choice Patient states their goals for this hospitalization and ongoing recovery are:: Return home CMS Medicare.gov Compare Post Acute Care list provided to:: Patient Choice offered to / list presented to : Patient      Discharge Placement               Home        Discharge Plan and Services Additional resources added to the After Visit Summary for   In-house Referral: PCP / Management consultant, Financial Counselor Discharge Planning Services: MATCH Program, Medication Assistance                                 Social Drivers of Health (SDOH) Interventions SDOH Screenings   Food Insecurity: Food Insecurity Present (12/23/2023)  Housing: Low Risk  (12/23/2023)  Transportation Needs: Unmet Transportation Needs (12/23/2023)  Utilities: At Risk (12/23/2023)  Depression (PHQ2-9): Low Risk  (08/23/2023)  Financial Resource Strain: Medium Risk (03/26/2023)   Received from [facility]  Physical Activity: Inactive (03/26/2023)   Received from [facility]  Social Connections: Socially Isolated (03/26/2023)   Received from [facility]  Stress: No Stress Concern Present (03/26/2023)   Received from [facility]  Tobacco Use: Low Risk  (12/23/2023)     Readmission Risk Interventions    12/25/2023   12:34 PM  Readmission  Risk Prevention Plan  Transportation Screening Complete  PCP or Specialist Appt within 5-7 Days Not Complete  Not Complete comments PCP 7/16  Home Care Screening Complete  Medication Review (RN CM) Complete

## 2023-12-25 NOTE — Progress Notes (Signed)
  MATCH MEDICATION ASSISTANCE CARD Pharmacies please call [phone] for claim processing assistance.  Rx BIN: [id number] Rx Group: [id number] Rx PCN: [id number] Relationship Code: 1 Person Code: 01  Patient ID (MRN): [id number]    Patient Name: Donna Hall   Patient DOB 12-22-1985   Discharge Date:12/25/2023  Expiration Date:01/01/2024 (must be filled within 7 days of discharge)

## 2024-01-09 ENCOUNTER — Ambulatory Visit (INDEPENDENT_AMBULATORY_CARE_PROVIDER_SITE_OTHER): Payer: Self-pay | Admitting: Internal Medicine

## 2024-01-09 ENCOUNTER — Encounter (HOSPITAL_BASED_OUTPATIENT_CLINIC_OR_DEPARTMENT_OTHER): Payer: Self-pay | Admitting: Family Medicine

## 2024-01-09 ENCOUNTER — Encounter: Payer: Self-pay | Admitting: Internal Medicine

## 2024-01-09 ENCOUNTER — Ambulatory Visit (HOSPITAL_BASED_OUTPATIENT_CLINIC_OR_DEPARTMENT_OTHER): Payer: Self-pay | Admitting: Family Medicine

## 2024-01-09 ENCOUNTER — Other Ambulatory Visit (HOSPITAL_BASED_OUTPATIENT_CLINIC_OR_DEPARTMENT_OTHER): Payer: Self-pay

## 2024-01-09 VITALS — BP 136/82 | HR 88 | Ht 61.0 in

## 2024-01-09 VITALS — BP 132/78 | HR 73 | Temp 98.3°F | Ht 60.0 in | Wt 334.0 lb

## 2024-01-09 DIAGNOSIS — Z6841 Body Mass Index (BMI) 40.0 and over, adult: Secondary | ICD-10-CM

## 2024-01-09 DIAGNOSIS — D649 Anemia, unspecified: Secondary | ICD-10-CM

## 2024-01-09 DIAGNOSIS — J9611 Chronic respiratory failure with hypoxia: Secondary | ICD-10-CM

## 2024-01-09 DIAGNOSIS — I5032 Chronic diastolic (congestive) heart failure: Secondary | ICD-10-CM

## 2024-01-09 DIAGNOSIS — Q320 Congenital tracheomalacia: Secondary | ICD-10-CM

## 2024-01-09 DIAGNOSIS — M060A Rheumatoid arthritis without rheumatoid factor, other specified site: Secondary | ICD-10-CM

## 2024-01-09 DIAGNOSIS — K5903 Drug induced constipation: Secondary | ICD-10-CM

## 2024-01-09 DIAGNOSIS — J45909 Unspecified asthma, uncomplicated: Secondary | ICD-10-CM

## 2024-01-09 DIAGNOSIS — G4733 Obstructive sleep apnea (adult) (pediatric): Secondary | ICD-10-CM

## 2024-01-09 DIAGNOSIS — M069 Rheumatoid arthritis, unspecified: Secondary | ICD-10-CM

## 2024-01-09 DIAGNOSIS — J398 Other specified diseases of upper respiratory tract: Secondary | ICD-10-CM

## 2024-01-09 DIAGNOSIS — E662 Morbid (severe) obesity with alveolar hypoventilation: Secondary | ICD-10-CM

## 2024-01-09 DIAGNOSIS — I272 Pulmonary hypertension, unspecified: Secondary | ICD-10-CM

## 2024-01-09 DIAGNOSIS — E669 Obesity, unspecified: Secondary | ICD-10-CM

## 2024-01-09 MED ORDER — CLOBETASOL PROPIONATE 0.05 % EX CREA
1.0000 | TOPICAL_CREAM | Freq: Two times a day (BID) | CUTANEOUS | 1 refills | Status: DC
  Filled 2024-01-09: qty 30, 60d supply, fill #0

## 2024-01-09 MED ORDER — ACETAMINOPHEN 500 MG PO TABS
1000.0000 mg | ORAL_TABLET | Freq: Four times a day (QID) | ORAL | 1 refills | Status: AC | PRN
  Filled 2024-01-09: qty 60, 8d supply, fill #0

## 2024-01-09 MED ORDER — POLYETHYLENE GLYCOL 3350 17 GM/SCOOP PO POWD
17.0000 g | Freq: Every day | ORAL | 1 refills | Status: DC
  Filled 2024-01-09: qty 510, 30d supply, fill #0

## 2024-01-09 MED ORDER — DOCUSATE SODIUM 100 MG PO CAPS
100.0000 mg | ORAL_CAPSULE | Freq: Two times a day (BID) | ORAL | 1 refills | Status: DC | PRN
  Filled 2024-01-09: qty 60, 30d supply, fill #0

## 2024-01-09 NOTE — Assessment & Plan Note (Signed)
 Patient currently taking furosemide  as well as spironolactone .  During evaluation of shortness of breath in the hospital [place] did not appear to have acute worsening of underlying heart failure and shortness of breath was felt to be related to alternative etiology.  She was advised on continue with medications as prescribed.  Given history, I do feel that patient would benefit from establishing with cardiology, patient in agreement, referral placed today

## 2024-01-09 NOTE — Assessment & Plan Note (Signed)
 Chronic issue for patient.  Was noted during recent hospitalization.  Observe anemia is at baseline as compared to labs over the past several years.  Has had iron deficiency in the past, not checked recently. Given ongoing anemia, can assess current iron storage, labs to be completed today

## 2024-01-09 NOTE — Patient Instructions (Addendum)
 It was a pleasure to see you today!  Please schedule follow up with myself in 4 months.  If my schedule is not open yet, we will contact you with a reminder closer to that time. Please call [phone] if you haven't heard from us  a month before, and always call us  sooner if issues or concerns arise. You can also send us  a message through MyChart, but but aware that this is not to be used for urgent issues and it may take up to 5-7 days to receive a reply. Please be aware that you will likely be able to view your results before I have a chance to respond to them. Please give us  5 business days to respond to any non-urgent results.    VISIT SUMMARY:  Today, we discussed your ongoing breathing difficulties and reviewed your current treatments. We also addressed your joint pain, weight concerns, and anxiety. We have made some adjustments to your treatment plan to help manage your symptoms more effectively.  YOUR PLAN:  -CHRONIC RESPIRATORY FAILURE WITH HYPOXIA: Chronic respiratory failure with hypoxia means your lungs are not getting enough oxygen  into your blood. You should continue using your oxygen  therapy at 4 liters per minute both day and night. We will also order an echocardiogram to check your heart's condition.  -OBSTRUCTIVE SLEEP APNEA: Obstructive sleep apnea is a condition where your breathing stops and starts during sleep. You should continue using your BiPAP machine at night to help with your breathing.  -TRACHEOMALACIA: Tracheomalacia is a condition where the windpipe is collapsing and can cause breathing difficulties. Continue using your BiPAP machine at night as there is no specific treatment for this condition.  -RHEUMATOID ARTHRITIS AND PSORIATIC ARTHRITIS: Rheumatoid arthritis is an autoimmune disorder that causes joint pain and swelling. Continue your prednisone  taper as instructed, and we will refer you to a rheumatologist for further management.  -CONSTIPATION DUE TO  MEDICATION: Constipation can be a side effect of some medications, including prednisone . We will prescribe stool softeners to help with this issue.  INSTRUCTIONS:  Please continue using your oxygen  therapy and BiPAP machine as directed. We will schedule an echocardiogram to assess your heart to check for pulmonary hypertension. Follow up with the rheumatologist for your psoriatic and rheumatoid arthritis. Follow the prednisone  taper and finish the antibiotics as instructed and take the prescribed stool softeners for constipation. If you have any concerns or experience any new symptoms, please contact our office.  Fue un placer verte hoy!  Por favor, programa una cita de seguimiento conmigo dentro de 4 meses. Si mi agenda an no est disponible, te enviaremos un recordatorio ms cerca de esa fecha. Si no has tenido noticias nuestras en el ltimo mes, llama al [phone] y, si tienes algn problema o inquietud, llmanos siempre antes. Tambin puedes enviarnos un mensaje a travs de MyChart, pero ten en cuenta que esta opcin no se utiliza para asuntos urgentes y que la respuesta puede tardar [place] [address] y [address]. Es probable que puedas ver tus resultados antes de que podamos responderlos. Para cualquier resultado que no sea urgente, danos 5 das hbiles para responder.  RESUMEN DE LA VISITA:  Hoy hablamos sobre sus dificultades respiratorias y revisamos sus tratamientos actuales. Tambin abordamos su dolor articular, su preocupacin por el peso y su ansiedad. Hemos realizado algunos ajustes en su plan de tratamiento para ayudarle a controlar sus sntomas de forma ms eficaz.  SU PLAN:  - INSUFICIENCIA RESPIRATORIA CRNICA CON HIPOXIA: La insuficiencia respiratoria  crnica con hipoxia significa que sus pulmones no reciben suficiente oxgeno en la sangre. Debe continuar con su oxigenoterapia a 4 litros por minuto, tanto de [employer] de noche. Tambin le solicitaremos un ecocardiograma para evaluar su  estado cardaco.  - APNEA OBSTRUCTIVA DEL SUEO: La apnea obstructiva del sueo es una afeccin en la que la respiracin se detiene y se reanuda durante el sueo. Debe continuar usando su mquina BiPAP por la noche para facilitar su respiracin.  - TRAQUEOMALACIA: La traqueomalacia es una afeccin en la que la trquea colapsa y puede causar dificultades respiratorias. Contine usando su mquina BiPAP por la noche, ya que no existe un tratamiento especfico para [place].  ARTRITIS REUMATOIDE Y ARTRITIS PSORISICA: La artritis reumatoide es una enfermedad autoinmune que causa dolor e inflamacin en las articulaciones. Contine con la reduccin gradual de prednisona segn las indicaciones y lo derivaremos a un reumatlogo para su posterior tratamiento.  ESTREIMIENTO POR MEDICAMENTOS: El estreimiento puede ser un efecto secundario de algunos medicamentos, incluida la prednisona. Le recetaremos ablandadores de heces para Holiday representative problema.  INSTRUCCIONES:  Contine usando su oxigenoterapia y el equipo BiPAP segn las indicaciones. Le programaremos un ecocardiograma para evaluar su corazn y Engineer, manufacturing hipertensin pulmonar. Acuda a un seguimiento con el reumatlogo para su artritis psorisica y reumatoide. Siga la reduccin gradual de prednisona y complete los antibiticos segn las indicaciones, y tome los ablandadores de heces recetados para el estreimiento. Si tiene alguna inquietud o experimenta algn sntoma nuevo, comunquese con nuestra oficina.

## 2024-01-09 NOTE — Assessment & Plan Note (Signed)
 Patient previously referred to rheumatology, has not established as of yet, however does appear that she has appointment scheduled in about 2 weeks.  She indicates that she is having some associated joint pain with underlying rheumatoid arthritis.  We discussed considerations today and can continue with OTC medications to help with pain control.  Recommend continuing with schedule appointment with rheumatology for further recommendations regarding management of underlying autoimmune condition

## 2024-01-09 NOTE — Progress Notes (Signed)
 Procedures performed today:    None.  Independent interpretation of notes and tests performed by another provider:   None.  Brief History, Exam, Impression, and Recommendations:    BP 136/82 (BP Location: Left Arm, Patient Position: Sitting, Cuff Size: Large)   Pulse 88   Ht 5' 1 (1.549 m)   SpO2 97%   BMI 60.40 kg/m   Chronic heart failure with preserved ejection fraction (HFpEF) (HCC) Assessment & Plan: Patient currently taking furosemide  as well as spironolactone .  During evaluation of shortness of breath in the hospital [facility] did not appear to have acute worsening of underlying heart failure and shortness of breath was felt to be related to alternative etiology.  She was advised on continue with medications as prescribed.  Given history, I do feel that patient would benefit from establishing with cardiology, patient in agreement, referral placed today  Orders: -     Ambulatory referral to Cardiology -     Basic metabolic panel with GFR  Chronic respiratory failure with hypoxia [facility]) Assessment & Plan: Patient with recent hospitalization related to acute on chronic hypoxic hypercapnic respiratory failure.  She was treated in the hospital with steroid taper, diuretics.  She initially was started on antibiotics, however these were eventually tapered off.  She reports that she does feel to be at baseline.  Still with some shortness of breath, however improved from recent hospitalization.  At last visit, we did refer patient to pulmonology.  She has not established as of yet, but does have appointment to establish later today.  Unfortunately, she did seem somewhat surprised about appointment with pulmonology today.  She has some concern regarding transportation to get to this appointment. On exam, patient is in no acute distress, vital signs stable.  Cardiovascular exam with regular rate and rhythm.  Lungs with distant breath sounds, otherwise clear to auscultation. Can continue  with current treatment plan in regards to inhalers, oxygen .  Recommend proceeding with pulmonology evaluation today.  She ultimately thinks that she would prefer to see pulmonology here at [facility].  Advised for her to establish with pulmonology with visit today and then discussed with their office about possibly transferring to pulmonologist here at our facility.  Orders: -     Basic metabolic panel with GFR  Rheumatoid arthritis involving multiple sites, unspecified whether rheumatoid factor present [facility]) Assessment & Plan: Patient previously referred to rheumatology, has not established as of yet, however does appear that she has appointment scheduled in about 2 weeks.  She indicates that she is having some associated joint pain with underlying rheumatoid arthritis.  We discussed considerations today and can continue with OTC medications to help with pain control.  Recommend continuing with schedule appointment with rheumatology for further recommendations regarding management of underlying autoimmune condition   Anemia, unspecified type Assessment & Plan: Chronic issue for patient.  Was noted during recent hospitalization.  Observe anemia is at baseline as compared to labs over the past several years.  Has had iron deficiency in the past, not checked recently. Given ongoing anemia, can assess current iron storage, labs to be completed today  Orders: -     Basic metabolic panel with GFR -     Iron, TIBC and Ferritin Panel  Other orders -     Clobetasol  Propionate; Apply 1 Application topically 2 (two) times daily.  Dispense: 30 g; Refill: 1 -     Acetaminophen ; Take 2 tablets (1,000 mg total) by mouth every 6 (six) hours  as needed.  Dispense: 60 tablet; Refill: 1  Return in about 8 weeks (around 03/05/2024).  Spent 36 minutes on this patient encounter, including preparation, chart review, face-to-face counseling with patient and coordination of care, and documentation of  encounter   ___________________________________________ Gerhart Ruggieri de [place], MD, ABFM, [facility] Primary Care and Sports Medicine [place]

## 2024-01-09 NOTE — Assessment & Plan Note (Signed)
 Patient with recent hospitalization related to acute on chronic hypoxic hypercapnic respiratory failure.  She was treated in the hospital with steroid taper, diuretics.  She initially was started on antibiotics, however these were eventually tapered off.  She reports that she does feel to be at baseline.  Still with some shortness of breath, however improved from recent hospitalization.  At last visit, we did refer patient to pulmonology.  She has not established as of yet, but does have appointment to establish later today.  Unfortunately, she did seem somewhat surprised about appointment with pulmonology today.  She has some concern regarding transportation to get to this appointment. On exam, patient is in no acute distress, vital signs stable.  Cardiovascular exam with regular rate and rhythm.  Lungs with distant breath sounds, otherwise clear to auscultation. Can continue with current treatment plan in regards to inhalers, oxygen .  Recommend proceeding with pulmonology evaluation today.  She ultimately thinks that she would prefer to see pulmonology here at [facility].  Advised for her to establish with pulmonology with visit today and then discussed with their office about possibly transferring to pulmonologist here at our facility.

## 2024-01-09 NOTE — Patient Instructions (Signed)
  Medication Instructions:  Your physician recommends that you continue on your current medications as directed. Please refer to the Current Medication list given to you today. --If you need a refill on any your medications before your next appointment, please call your pharmacy first. If no refills are authorized on file call the office.--   Follow-Up: Your next appointment:   Your physician recommends that you schedule a follow-up appointment in: 6-8 week follow up  with Dr. de Peru  You will receive a text message or e-mail with a link to a survey about your care and experience with [place] today! We would greatly appreciate your feedback!   Thanks for letting us be apart of your health journey!!  Primary Care and Sports Medicine   Dr. Ceasar Mons Peru   We encourage you to activate your patient portal called "MyChart".  Sign up information is provided on this After Visit Summary.  MyChart is used to connect with patients for Virtual Visits (Telemedicine).  Patients are able to view lab/test results, encounter notes, upcoming appointments, etc.  Non-urgent messages can be sent to your provider as well. To learn more about what you can do with MyChart, please visit --  [email or web address].
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
## 2024-01-10 LAB — IRON,TIBC AND FERRITIN PANEL
Ferritin: 8 ng/mL — ABNORMAL LOW (ref 15–150)
Iron Saturation: 3 % — CL (ref 15–55)
Iron: 16 ug/dL — ABNORMAL LOW (ref 27–159)
Total Iron Binding Capacity: 481 ug/dL — ABNORMAL HIGH (ref 250–450)
UIBC: 465 ug/dL — ABNORMAL HIGH (ref 131–425)

## 2024-01-10 LAB — BASIC METABOLIC PANEL WITH GFR
BUN/Creatinine Ratio: 22 (ref 9–23)
BUN: 12 mg/dL (ref 6–20)
CO2: 23 mmol/L (ref 20–29)
Calcium: 8.5 mg/dL — ABNORMAL LOW (ref 8.7–10.2)
Chloride: 105 mmol/L (ref 96–106)
Creatinine, Ser: 0.55 mg/dL — ABNORMAL LOW (ref 0.57–1.00)
Glucose: 98 mg/dL (ref 70–99)
Potassium: 4.2 mmol/L (ref 3.5–5.2)
Sodium: 145 mmol/L — ABNORMAL HIGH (ref 134–144)
eGFR: 119 mL/min/1.73 (ref >59)

## 2024-01-19 ENCOUNTER — Other Ambulatory Visit (HOSPITAL_BASED_OUTPATIENT_CLINIC_OR_DEPARTMENT_OTHER): Payer: Self-pay

## 2024-01-23 NOTE — Progress Notes (Deleted)
 Office Visit Note  Patient: Donna Hall             Date of Birth: [birth year]           MRN: [id number]             PCP: de [place], Raymond J, MD Referring: de [place], Raymond J, MD Visit Date: 01/24/2024 Occupation: @GUAROCC @  Subjective:  No chief complaint on file.   History of Present Illness: Donna Hall is a 38 y.o. female ***    Donna Hall is a 38 y.o. 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  PMHx is significant for Pulm HTN, OHS who presented with acute on chronic (4L O2 w/ nightly BIPAP) respiratory failure   Initially seen in consult during admission 09/27-10/18 where she was managed for acute on chronic hypoxemic/hypercapnic respiratory failure S/p intubation/extubation d/t + MRSA PNA.  Venogram w/ no e/o cerebral vein thrombosis and repeat CT head w/ stable edema w/ Similar findings on mri 2022. Evaluated by cardiology for Hfpef and PH who rec'd RHC, as possible outpatient, and continued diuresis. Rheum was consulted for PsO and RA/RA-ILD? At that time she had been on steroids and given her comorbidities they were rapidly tapered and plan was to start Orencia. She did not receive any Orencia. She wasthen seen at outpatient pulmnology follow up where a 14 day course of prednisone  20mg  was started.   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 ANA 1:160 /DNA negative, Smith/RNP negative. Imaging at that time revealed no erosions of hands/wrists, but + periarticular osteopenia. Unremarkable cervical and L spine.   Interim Seen by pulmonology 05/31/23 and was given prednisone  20mg  x 14 days and was plan to discuss at ILD conference.   Today 06/14/23:  Is currently taking one tablet of prednisone  currently. Has not received any Orencia yet.  Having significant joint pain in hands,  shoulders, neck, feet, knees w/ associated prolonged AM stiffness lasting >1 hour. No relief w/ NSAIDs. Has psoriasis; ran out of topical treatment 3 days ago. Has not seen dermatology. Reports hx of dactylitis, none currently. No SOB at rest, worse on exertion. Has OHS and uses CPAP.   No fevers, unintentional weight loss, raynauds, hematuria, hematochezia, drenching night sweats, N/V/D ulcers in mouth/nose/ genitals.   Historical Serology ANA 1:160 homogenous  DNA negative  Smith/RNP negative  Complements wnl   History of treatment  Imuran , HCQ, Enbrel (started 2019)  Prednisone   NSAIDs   ***   Activities of Daily Living:  Patient reports morning stiffness for *** {minute/hour:19697}.   Patient {ACTIONS;DENIES/REPORTS:21021675::Denies} nocturnal pain.  Difficulty dressing/grooming: {ACTIONS;DENIES/REPORTS:21021675::Denies} Difficulty climbing stairs: {ACTIONS;DENIES/REPORTS:21021675::Denies} Difficulty getting out of chair: {ACTIONS;DENIES/REPORTS:21021675::Denies} Difficulty using hands for taps, buttons, cutlery, and/or writing: {ACTIONS;DENIES/REPORTS:21021675::Denies}  No Rheumatology ROS completed.   PMFS History:  Patient Active Problem List   Diagnosis Date Noted   Respiratory failure with hypoxia and hypercapnia (HCC) 12/23/2023   Chronic heart failure with preserved ejection fraction (HFpEF) (HCC) 12/23/2023   Prolonged QT interval 12/23/2023   Severe obesity (BMI >= 40) (HCC) 08/23/2023   Illiterate 04/23/2023   Language barrier to communication 04/23/2023   Other malaise 04/22/2023   Atelectasis 04/11/2023   Other nonspecific abnormal finding of lung field 04/07/2023   Pleural effusion, not elsewhere classified 04/07/2023   Ventral hernia without obstruction or gangrene 04/07/2023  Hypertensive heart disease with heart failure (HCC) 04/06/2023   Weakness 04/06/2023   Altered mental status, unspecified 04/05/2023   Gastro-esophageal reflux disease  without esophagitis 04/05/2023   Dependence on respirator (ventilator) status (HCC) 04/02/2023   Hypokalemia 04/02/2023   Other specified symptoms and signs involving the circulatory and respiratory systems 04/02/2023   Other abnormalities of breathing 03/31/2023   Pulsatile tinnitus, unspecified ear 03/31/2023   Vitamin D  deficiency, unspecified 03/31/2023   Benign intracranial hypertension 03/30/2023   Iron deficiency 03/29/2023   Edema of both lower extremities 11/29/2022   At high risk for falls 03/16/2022   Sepsis with acute hypoxic respiratory failure without septic shock (HCC) 03/05/2022   Bronchomalacia 11/11/2021   Wound of abdomen 11/11/2021   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   Immobility 08/26/2021   Pulmonary arterial hypertension (HCC) 08/26/2021   Necrotizing soft tissue infection 08/25/2021   IUD strings lost 09/18/2016   Genital warts 10/18/2015   IUD contraception 10/18/2015   Dyspnea 10/09/2015   Acute on chronic respiratory failure with hypoxia (HCC) 10/09/2015   HCAP (healthcare-associated pneumonia) 10/09/2015   Chronic respiratory failure with hypoxia (HCC) 09/15/2015   Asthma exacerbation    Cough 05/18/2015   History of pulmonary embolism 04/02/2015   Chronic pain syndrome 10/23/2014   Condyloma acuminatum of perianal region 10/23/2014   Chronic anticoagulation    Moderate obstructive sleep apnea 10/13/2014   Itching 09/18/2014   Cough variant asthma vs UACS 07/23/2014   Dizziness 07/13/2014   Headache 07/03/2014   Anxiety and depression 05/14/2014   Knee pain, right 04/02/2014   Essential hypertension, benign 03/12/2014   Systemic lupus erythematosus (HCC) 06/15/2013   Other long term (current) drug therapy 01/21/2013   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   Shortness of breath 12/27/2012   Erythrocyte aplasia (HCC) 12/25/2012   Anemia 12/25/2012   Arthritis or  polyarthritis, rheumatoid (HCC) 09/30/2012   Obesity, Class III, BMI 40-49.9 (morbid obesity) 03/23/2012   Extreme obesity 03/23/2012   Rheumatoid arthritis (HCC) 03/07/2012   Psoriasis 03/07/2012   Long term current use of systemic steroids 03/07/2012   Synovitis and tenosynovitis 03/07/2012   Dermatitis seborrheica 12/01/2010   Collagen disease (HCC) 10/26/2010   Diffuse connective tissue disease (HCC) 10/26/2010   NSIP (nonspecific interstitial pneumonia) (HCC) 09/20/2009   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   Dermatomyositis (HCC) 08/02/2009   INTERSTITIAL LUNG DISEASE 05/24/2009   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   2008: trivial 09/2007: moderate to large, improved on f/u ECHO    Gestational diabetes 2006   Headache    just when I have the pain from aching bones and psorasis and/or lupus symptoms (05/15/2104)   History of blood transfusion    because white cells ate the red cells   Hypertension 2010   Interstitial lung disease (HCC)    thelbert 05/15/2014   Lupus 2000   Lupus (systemic lupus erythematosus) (HCC) 2010   Obesity    On home oxygen  therapy    2L; 24/7 (05/15/2014)   Pneumonia several times   Psoriasis 2010   as a child   Pulmonary embolism (HCC)    hx/notes 05/15/2014   Rheumatoid arthritis(714.0) 2010   Sleep apnea     Family History  Problem Relation Age of  Onset   Diabetes Mother    Hypertension Mother    Cancer Neg Hx    Early death Neg Hx    Heart disease Neg Hx    Past Surgical History:  Procedure Laterality Date   CESAREAN SECTION  2010   INDUCED ABORTION     THIGH / KNEE SOFT TISSUE BIOPSY Right 2011   thigh   Social History   Social History Narrative   Patient lives with husband and 3 children (12 in [place], 35 and 4) in [place]. She migrated form [place] in  2005. She does not work. Has completed the first grade. Cannot read and write.    Immunization History  Administered Date(s) Administered   Fluzone Influenza virus vaccine,trivalent (IIV3), split virus 04/29/2013, 04/15/2014   Influenza Split 03/11/2012   Influenza,inj,Quad PF,6+ Mos 06/16/2013, 02/23/2014, 05/04/2014, 05/18/2015   PPD Test 07/26/2007, 12/13/2011   Pneumococcal Polysaccharide-23 03/10/2012, 07/24/2012, 01/03/2013     Objective: Vital Signs: LMP 01/09/2024    Physical Exam   Musculoskeletal Exam: ***  CDAI Exam: CDAI Score: -- Patient Global: --; Provider Global: -- Swollen: --; Tender: -- Joint Exam 01/24/2024   No joint exam has been documented for this visit   There is currently no information documented on the homunculus. Go to the Rheumatology activity and complete the homunculus joint exam.  Investigation: No additional findings.  Imaging: No results found.  Recent Labs: Lab Results  Component Value Date   WBC 10.4 12/25/2023   HGB 10.4 (L) 12/25/2023   PLT 269 12/25/2023   NA 145 (H) 01/09/2024   K 4.2 01/09/2024   CL 105 01/09/2024   CO2 23 01/09/2024   GLUCOSE 98 01/09/2024   BUN 12 01/09/2024   CREATININE 0.55 (L) 01/09/2024   BILITOT 0.4 12/22/2023   ALKPHOS 96 12/22/2023   AST 44 (H) 12/22/2023   ALT 31 12/22/2023   PROT 8.0 12/22/2023   ALBUMIN 3.6 12/22/2023   CALCIUM 8.5 (L) 01/09/2024   GFRAA >89 02/01/2016    Speciality Comments: No specialty comments available.  Procedures:  No procedures performed Allergies: Patient has no known allergies.   Assessment / Plan:     Visit Diagnoses: No diagnosis found.  Orders: No orders of the defined types were placed in this encounter.  No orders of the defined types were placed in this encounter.   Face-to-face time spent with patient was *** minutes. Greater than 50% of time was spent in counseling and coordination of care.  Follow-Up Instructions: No follow-ups on  file.   Lonni LELON Ester, MD  Note - This record has been created using [employer].  Chart creation errors have been sought, but may not always  have been located. Such creation errors do not reflect on  the standard of medical care.

## 2024-01-24 ENCOUNTER — Encounter: Payer: Self-pay | Admitting: Internal Medicine

## 2024-01-24 ENCOUNTER — Ambulatory Visit (HOSPITAL_BASED_OUTPATIENT_CLINIC_OR_DEPARTMENT_OTHER): Payer: Self-pay | Admitting: Family Medicine

## 2024-01-24 DIAGNOSIS — D509 Iron deficiency anemia, unspecified: Secondary | ICD-10-CM

## 2024-01-24 NOTE — Telephone Encounter (Signed)
 Called and spoke with patient's son and related the message. The son interpreted the message to his mother while on the phone with me.

## 2024-02-04 ENCOUNTER — Ambulatory Visit (HOSPITAL_COMMUNITY): Admission: RE | Admit: 2024-02-04 | Payer: Self-pay | Source: Ambulatory Visit

## 2024-02-26 ENCOUNTER — Inpatient Hospital Stay: Payer: Self-pay

## 2024-02-26 ENCOUNTER — Inpatient Hospital Stay: Payer: Self-pay | Admitting: Oncology

## 2024-02-26 ENCOUNTER — Telehealth: Payer: Self-pay | Admitting: Oncology

## 2024-03-06 ENCOUNTER — Ambulatory Visit (HOSPITAL_BASED_OUTPATIENT_CLINIC_OR_DEPARTMENT_OTHER): Payer: Self-pay | Admitting: Family Medicine

## 2024-03-10 ENCOUNTER — Ambulatory Visit (HOSPITAL_BASED_OUTPATIENT_CLINIC_OR_DEPARTMENT_OTHER): Payer: Self-pay | Admitting: Family Medicine

## 2024-04-14 ENCOUNTER — Inpatient Hospital Stay: Payer: Self-pay

## 2024-04-14 ENCOUNTER — Telehealth: Payer: Self-pay

## 2024-04-14 ENCOUNTER — Inpatient Hospital Stay: Payer: Self-pay | Admitting: Oncology

## 2024-04-14 NOTE — Telephone Encounter (Signed)
 Followed up using Spanish Interpretor from [facility] utilized for call with patient on phone regarding missed lab and Hematology appt today with Dr. Autumn. Patient requested rescheduling. Spoke with [place] Spanish Interpretor and the [employer]. Scheduler to call [place] Interpretor to offer some appt choices for patient. Interpretor to communicate with scheduler around patient's choice. Patient also needs transportation set up, which will get handled after next appt is set up.

## 2024-04-17 ENCOUNTER — Other Ambulatory Visit: Payer: Self-pay

## 2024-04-17 ENCOUNTER — Ambulatory Visit (INDEPENDENT_AMBULATORY_CARE_PROVIDER_SITE_OTHER): Payer: Self-pay | Admitting: Family Medicine

## 2024-04-17 ENCOUNTER — Other Ambulatory Visit (HOSPITAL_BASED_OUTPATIENT_CLINIC_OR_DEPARTMENT_OTHER): Payer: Self-pay

## 2024-04-17 VITALS — BP 129/79 | HR 90 | Temp 98.3°F | Resp 16 | Wt 335.8 lb

## 2024-04-17 DIAGNOSIS — E611 Iron deficiency: Secondary | ICD-10-CM

## 2024-04-17 DIAGNOSIS — J9611 Chronic respiratory failure with hypoxia: Secondary | ICD-10-CM

## 2024-04-17 DIAGNOSIS — M351 Other overlap syndromes: Secondary | ICD-10-CM

## 2024-04-17 DIAGNOSIS — M069 Rheumatoid arthritis, unspecified: Secondary | ICD-10-CM

## 2024-04-17 MED ORDER — IPRATROPIUM-ALBUTEROL 0.5-2.5 (3) MG/3ML IN SOLN
3.0000 mL | Freq: Three times a day (TID) | RESPIRATORY_TRACT | 1 refills | Status: DC
  Filled 2024-04-17 (×2): qty 360, 40d supply, fill #0

## 2024-04-17 MED ORDER — BUDESONIDE-FORMOTEROL FUMARATE 80-4.5 MCG/ACT IN AERO
2.0000 | INHALATION_SPRAY | Freq: Two times a day (BID) | RESPIRATORY_TRACT | 12 refills | Status: AC
  Filled 2024-04-17: qty 10.2, 30d supply, fill #0

## 2024-04-17 MED ORDER — POLYETHYLENE GLYCOL 3350 17 GM/SCOOP PO POWD
17.0000 g | Freq: Every day | ORAL | 1 refills | Status: DC
  Filled 2024-04-17: qty 510, 30d supply, fill #0

## 2024-04-17 MED ORDER — PREDNISONE 20 MG PO TABS
20.0000 mg | ORAL_TABLET | Freq: Every day | ORAL | 0 refills | Status: DC
  Filled 2024-04-17: qty 30, 30d supply, fill #0
  Filled 2024-04-17: qty 90, 90d supply, fill #0

## 2024-04-17 MED ORDER — FAMOTIDINE 20 MG PO TABS
20.0000 mg | ORAL_TABLET | Freq: Two times a day (BID) | ORAL | 1 refills | Status: DC
  Filled 2024-04-17 (×2): qty 60, 30d supply, fill #0

## 2024-04-17 MED ORDER — DOCUSATE SODIUM 100 MG PO CAPS
100.0000 mg | ORAL_CAPSULE | Freq: Two times a day (BID) | ORAL | 1 refills | Status: AC | PRN
  Filled 2024-04-17 (×2): qty 60, 30d supply, fill #0

## 2024-04-17 MED ORDER — ALBUTEROL SULFATE HFA 108 (90 BASE) MCG/ACT IN AERS
2.0000 | INHALATION_SPRAY | RESPIRATORY_TRACT | 3 refills | Status: AC | PRN
  Filled 2024-04-17: qty 6.7, 12d supply, fill #0
  Filled 2024-04-17: qty 6.7, 17d supply, fill #0

## 2024-04-17 MED ORDER — CLOBETASOL PROPIONATE 0.05 % EX CREA
1.0000 | TOPICAL_CREAM | Freq: Two times a day (BID) | CUTANEOUS | 1 refills | Status: DC
  Filled 2024-04-17 (×2): qty 30, 30d supply, fill #0

## 2024-04-17 NOTE — Assessment & Plan Note (Signed)
 Patient previously referred to rheumatology, has not established as of yet, she did have appt scheduled but did not have transportation for this.  She indicates that she is having some associated joint pain with underlying rheumatoid arthritis.  We discussed considerations today and can continue with OTC medications to help with pain control.  Recommend continuing with schedule appointment with rheumatology for further recommendations regarding management of underlying autoimmune condition

## 2024-04-17 NOTE — Progress Notes (Signed)
 Procedures performed today:    None.  Independent interpretation of notes and tests performed by another provider:   None.  Brief History, Exam, Impression, and Recommendations:    BP 129/79 (Cuff Size: Large)   Pulse 90   Temp 98.3 F (36.8 C) (Oral)   Resp 16   Wt (!) 335 lb 12.8 oz (152.3 kg)   LMP  (LMP Unknown)   SpO2 98% Comment: on 4 liters  BMI 65.58 kg/m   In-person interpreter utilized for appointment. Patient with poor health literacy.  Rheumatoid arthritis involving multiple sites, unspecified whether rheumatoid factor present [facility]) Assessment & Plan: Patient previously referred to rheumatology, has not established as of yet, she did have appt scheduled but did not have transportation for this.  She indicates that she is having some associated joint pain with underlying rheumatoid arthritis.  We discussed considerations today and can continue with OTC medications to help with pain control.  Recommend continuing with schedule appointment with rheumatology for further recommendations regarding management of underlying autoimmune condition   Connective tissue disease overlap syndrome Assessment & Plan: As discussed separately related to rheumatoid arthritis  Orders: -     Albuterol  Sulfate HFA; Inhale 2 puffs into the lungs every 4 (four) hours as needed for wheezing or shortness of breath.  Dispense: 6.7 g; Refill: 3  Chronic respiratory failure with hypoxia [facility]) Assessment & Plan: Patient did have a establishing visit with pulmonology around time of last office visit with me.  Unfortunately, she has not had further follow-up and it does appear that pulmonology had ordered for echocardiogram to be done, however it appears that appointment for this was missed.  Ultimately, patient does need to have continue close follow-up with pulmonologist as well as recommend evaluation as per pulmonology.  Reviewed recommendations again with patient and encouraged to continue  with regular follow-up appointments as well as proceeding with recommended testing and evaluation   Iron deficiency Assessment & Plan: She did have appointment scheduled with hematology few days ago, however she missed this appointment.  Again reiterated importance of keeping scheduled appointments with providers given notable underlying chronic medical issues.  Provided patient with contact information for hematology so that she may reschedule missed appointment   Other orders -     Clobetasol  Propionate; Apply 1 Application topically 2 (two) times daily.  Dispense: 30 g; Refill: 1 -     Budesonide -Formoterol  Fumarate; Inhale 2 puffs into the lungs 2 (two) times daily.  Dispense: 10.2 g; Refill: 12 -     Ipratropium-Albuterol ; Take 3 mLs by nebulization 3 (three) times daily.  Dispense: 360 mL; Refill: 1 -     predniSONE ; Take 1 tablet (20 mg total) by mouth daily with breakfast.  Dispense: 90 tablet; Refill: 0 -     Famotidine ; Take 1 tablet (20 mg total) by mouth 2 (two) times daily.  Dispense: 180 tablet; Refill: 1 -     Polyethylene Glycol 3350 ; Take 17 g by mouth daily.  Dispense: 510 g; Refill: 1 -     Docusate Sodium ; Take 1 capsule (100 mg total) by mouth 2 (two) times daily as needed for mild constipation.  Dispense: 60 capsule; Refill: 1  Return in about 3 months (around 07/18/2024) for 60 minutes.  Spent 43 minutes on this patient encounter, including preparation, chart review, face-to-face counseling with patient and coordination of care, and documentation of encounter   ___________________________________________ Jimma Ortman de [place], MD, ABFM, [facility]

## 2024-04-29 NOTE — Assessment & Plan Note (Signed)
 As discussed separately related to rheumatoid arthritis

## 2024-04-29 NOTE — Assessment & Plan Note (Signed)
 Patient did have a establishing visit with pulmonology around time of last office visit with me.  Unfortunately, she has not had further follow-up and it does appear that pulmonology had ordered for echocardiogram to be done, however it appears that appointment for this was missed.  Ultimately, patient does need to have continue close follow-up with pulmonologist as well as recommend evaluation as per pulmonology.  Reviewed recommendations again with patient and encouraged to continue with regular follow-up appointments as well as proceeding with recommended testing and evaluation

## 2024-04-29 NOTE — Assessment & Plan Note (Signed)
 She did have appointment scheduled with hematology few days ago, however she missed this appointment.  Again reiterated importance of keeping scheduled appointments with providers given notable underlying chronic medical issues.  Provided patient with contact information for hematology so that she may reschedule missed appointment

## 2024-05-18 NOTE — Progress Notes (Deleted)
  Cardiology Office Note:  .   Date:  05/18/2024  ID:  Borghild Thaker, DOB 03/06/1986, MRN [id number] PCP: de [place], Raymond J, MD  [facility] Providers Cardiologist:  None   History of Present Illness: .   No chief complaint on file.   Donna Hall is a 38 y.o. female with history of obesity, RA, respiratory failure who presents for the evaluation of SOB/HFpEF at the request of de Cuba, Quintin PARAS, MD.     Problem List Rheumatoid arthritis  Morbid obesity  -BMI 65 3. Asthma 4. OSA 5. Pulmonary hypertension  6. HFpEF    ROS: All other ROS reviewed and negative. Pertinent positives noted in the HPI.     Studies Reviewed: SABRA       Physical Exam:   VS:  There were no vitals taken for this visit.   Wt Readings from Last 3 Encounters:  04/17/24 (!) 335 lb 12.8 oz (152.3 kg)  01/09/24 (!) 334 lb (151.5 kg)  12/22/23 (!) 319 lb 10.7 oz (145 kg)    GEN: Well nourished, well developed in no acute distress NECK: No JVD; No carotid bruits CARDIAC: ***RRR, no murmurs, rubs, gallops RESPIRATORY:  Clear to auscultation without rales, wheezing or rhonchi  ABDOMEN: Soft, non-tender, non-distended EXTREMITIES:  No edema; No deformity  ASSESSMENT AND PLAN: .   ***    {Are you ordering a CV Procedure (e.g. stress test, cath, DCCV, TEE, etc)?   Press F2        :[id number]}   Follow-up: No follow-ups on file.  Signed, Darryle DASEN. Barbaraann, MD, [facility]  [facility]  [address] [place], [place] [address] [phone]  7:04 PM

## 2024-05-19 ENCOUNTER — Ambulatory Visit (HOSPITAL_BASED_OUTPATIENT_CLINIC_OR_DEPARTMENT_OTHER): Payer: Self-pay | Admitting: Cardiovascular Disease

## 2024-05-19 DIAGNOSIS — I272 Pulmonary hypertension, unspecified: Secondary | ICD-10-CM

## 2024-05-19 DIAGNOSIS — I5032 Chronic diastolic (congestive) heart failure: Secondary | ICD-10-CM

## 2024-05-19 DIAGNOSIS — R0602 Shortness of breath: Secondary | ICD-10-CM

## 2024-05-27 NOTE — Progress Notes (Deleted)
 [facility]   [phone] [phone]   Clinic New consult Note   Patient Care Team: de Cuba, Quintin PARAS, MD as PCP - General (Family Medicine) 05/27/2024  CHIEF COMPLAINTS/PURPOSE OF CONSULTATION:  ***  HISTORY OF PRESENTING ILLNESS:  Donna Hall 38 y.o. female is here because of ***  ***She was found to have abnormal CBC from *** ***She denies recent chest pain on exertion, shortness of breath on minimal exertion, pre-syncopal episodes, or palpitations. ***She had not noticed any recent bleeding such as epistaxis, hematuria or hematochezia ***The patient denies over the counter NSAID ingestion. She is not *** on antiplatelets agents. Her last colonoscopy was *** ***She had no prior history or diagnosis of cancer. Her age appropriate screening programs are up-to-date. ***She denies any pica and eats a variety of diet. 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   2008: trivial 09/2007: moderate to large, improved on f/u ECHO    Gestational diabetes 2006   Headache    just when I have the pain from aching bones and psorasis and/or lupus symptoms (05/15/2104)   History of blood transfusion    because white cells ate the red cells   Hypertension 2010   Interstitial lung disease (HCC)    thelbert 05/15/2014   Lupus 2000   Lupus (systemic lupus erythematosus) (HCC) 2010   Obesity    On home oxygen  therapy    2L; 24/7 (05/15/2014)   Pneumonia several times   Psoriasis 2010   as a child   Pulmonary embolism (HCC)    hx/notes 05/15/2014   Rheumatoid arthritis(714.0) 2010   Sleep apnea     SURGICAL HISTORY: Past Surgical History:  Procedure Laterality Date    CESAREAN SECTION  2010   INDUCED ABORTION     THIGH / KNEE SOFT TISSUE BIOPSY Right 2011   thigh    SOCIAL HISTORY: Social History   Socioeconomic History   Marital status: Single    Spouse name: Not on file   Number of children: 3   Years of education: 1   Highest education level: Not on file  Occupational History   Occupation: Unemployed   Tobacco Use   Smoking status: Never    Passive exposure: Never   Smokeless tobacco: Never  Vaping Use   Vaping status: Never Used  Substance and Sexual Activity   Alcohol use: No   Drug use: No   Sexual activity: Not Currently    Comment: Pregnant  Other Topics Concern   Not on file  Social History Narrative   Patient lives with husband and 3 children (12 in [place], 64 and 4) in [place]. She migrated form [place] in 2005. She does not work. Has completed the first grade. Cannot read and write.    Social Drivers of Health   Financial Resource Strain: Medium Risk (03/26/2023)   Received from [facility]   Overall [employer] Strain (CARDIA)    Difficulty of Paying Living Expenses: Somewhat hard  Food Insecurity: Food Insecurity Present (12/23/2023)   Hunger Vital Sign    Worried About Running Out of Food in the Last Year: Often true    Ran Out of Food in the Last Year: Often  true  Transportation Needs: Unmet Transportation Needs (12/23/2023)   PRAPARE - Administrator, Civil Service (Medical): Yes    Lack of Transportation (Non-Medical): Yes  Physical Activity: Inactive (03/26/2023)   Received from [facility]   Exercise Vital Sign    On average, how many days per week do you engage in moderate to strenuous exercise (like a brisk walk)?: 0 days    On average, how many minutes do you engage in exercise at this level?: 0 min  Stress: No Stress Concern Present (03/26/2023)   Received from [facility]   [employer] - Occupational Stress Questionnaire    Feeling of Stress :  Only a little  Social Connections: Socially Isolated (03/26/2023)   Received from [facility]   Social Connection and Isolation Panel    In a typical week, how many times do you talk on the phone with family, friends, or neighbors?: Three times a week    How often do you get together with friends or relatives?: Three times a week    How often do you attend church or religious services?: Never    Do you belong to any clubs or organizations such as church groups, unions, fraternal or athletic groups, or school groups?: No    How often do you attend meetings of the clubs or organizations you belong to?: Never    Are you married, widowed, divorced, separated, never married, or living with a partner?: Never married  Intimate Partner Violence: Not At Risk (12/23/2023)   Humiliation, Afraid, Rape, and Kick questionnaire    Fear of Current or Ex-Partner: No    Emotionally Abused: No    Physically Abused: No    Sexually Abused: No    FAMILY HISTORY: Family History  Problem Relation Age of Onset   Diabetes Mother    Hypertension Mother    Cancer Neg Hx    Early death Neg Hx    Heart disease Neg Hx     ALLERGIES:  has no known allergies.  MEDICATIONS:  Current Outpatient Medications  Medication Sig Dispense Refill   acetaminophen  (TYLENOL ) 500 MG tablet Take 2 tablets (1,000 mg total) by mouth every 6 (six) hours as needed. 60 tablet 1   albuterol  (VENTOLIN  HFA) 108 (90 Base) MCG/ACT inhaler Inhale 2 puffs into the lungs every 4 (four) hours as needed for wheezing or shortness of breath. 6.7 g 3   budesonide -formoterol  (SYMBICORT ) 80-4.5 MCG/ACT inhaler Inhale 2 puffs into the lungs 2 (two) times daily. 10.2 g 12   clobetasol  cream (TEMOVATE ) 0.05 % Apply 1 Application topically 2 (two) times daily. 30 g 1   docusate sodium  (COLACE) 100 MG capsule Take 1 capsule (100 mg total) by mouth 2 (two) times daily as needed for mild constipation. 60 capsule 1   famotidine  (PEPCID ) 20 MG tablet Take  1 tablet (20 mg total) by mouth 2 (two) times daily. 180 tablet 1   furosemide  (LASIX ) 40 MG tablet Take 1 tablet (40 mg total) by mouth 2 (two) times daily. 60 tablet 1   guaiFENesin  (MUCINEX ) 600 MG 12 hr tablet Take 1 tablet (600 mg total) by mouth 2 (two) times daily. 30 tablet 0   ipratropium-albuterol  (DUONEB) 0.5-2.5 (3) MG/3ML SOLN Take 3 mLs by nebulization 3 (three) times daily. 360 mL 1   polyethylene glycol powder (GLYCOLAX /MIRALAX ) 17 GM/SCOOP powder Take 17 g by mouth daily. 510 g 1   potassium chloride  (KLOR-CON  M) 10 MEQ tablet Take  2 tablets (20 mEq total) by mouth daily. 120 tablet 1   predniSONE  (DELTASONE ) 20 MG tablet Take 1 tablet (20 mg total) by mouth daily with breakfast. 90 tablet 0   spironolactone  (ALDACTONE ) 25 MG tablet Take 1 tablet (25 mg total) by mouth daily. 90 tablet 1   No current facility-administered medications for this visit.    REVIEW OF SYSTEMS:   Constitutional: Denies fevers, chills or abnormal night sweats Eyes: Denies blurriness of vision, double vision or watery eyes Ears, nose, mouth, throat, and face: Denies mucositis or sore throat Respiratory: Denies cough, dyspnea or wheezes Cardiovascular: Denies palpitation, chest discomfort or lower extremity swelling Gastrointestinal:  Denies nausea, heartburn or change in bowel habits Skin: Denies abnormal skin rashes Lymphatics: Denies new lymphadenopathy or easy bruising Neurological:Denies numbness, tingling or new weaknesses Behavioral/Psych: Mood is stable, no new changes  All other systems were reviewed with the patient and are negative.  PHYSICAL EXAMINATION: ECOG PERFORMANCE STATUS: {CHL ONC ECOG PS:[id number]}  There were no vitals filed for this visit. There were no vitals filed for this visit.  GENERAL:alert, no distress and comfortable SKIN: skin color, texture, turgor are normal, no rashes or significant lesions EYES: normal, conjunctiva are pink and non-injected, sclera  clear OROPHARYNX:no exudate, no erythema and lips, buccal mucosa, and tongue normal  NECK: supple, thyroid normal size, non-tender, without nodularity LYMPH:  no palpable lymphadenopathy in the cervical, axillary or inguinal LUNGS: clear to auscultation and percussion with normal breathing effort HEART: regular rate & rhythm and no murmurs and no lower extremity edema ABDOMEN:abdomen soft, non-tender and normal bowel sounds Musculoskeletal:no cyanosis of digits and no clubbing  PSYCH: alert & oriented x 3 with fluent speech NEURO: no focal motor/sensory deficits  LABORATORY DATA:  I have reviewed the data as listed    Latest Ref Rng & Units 12/25/2023    3:47 AM 12/24/2023    2:29 AM 12/23/2023    3:00 AM  CBC  WBC 4.0 - 10.5 K/uL 10.4  10.8  8.9   Hemoglobin 12.0 - 15.0 g/dL 89.5  89.4  88.8   Hematocrit 36.0 - 46.0 % 36.9  36.5  39.1   Platelets 150 - 400 K/uL 269  251  245        Latest Ref Rng & Units 01/09/2024   11:36 AM 12/25/2023    3:47 AM 12/24/2023    2:29 AM  CMP  Glucose 70 - 99 mg/dL 98  894  837   BUN 6 - 20 mg/dL 12  12  13    Creatinine 0.57 - 1.00 mg/dL 9.44  9.51  9.43   Sodium 134 - 144 mmol/L 145  138  138   Potassium 3.5 - 5.2 mmol/L 4.2  3.6  4.2   Chloride 96 - 106 mmol/L 105  95  99   CO2 20 - 29 mmol/L 23  36  29   Calcium 8.7 - 10.2 mg/dL 8.5  8.3  8.6      RADIOGRAPHIC STUDIES: I have personally reviewed the radiological images as listed and agreed with the findings in the report. No results found.  ASSESSMENT & PLAN:  No problem-specific Assessment & Plan notes found for this encounter.    All questions were answered. The patient knows to call the clinic with any problems, questions or concerns. I spent {CHL ONC TIME VISIT - DTPQU:[id number]} counseling the patient face to face. The total time spent in the appointment was {CHL ONC TIME VISIT - DTPQU:[id number]} and  more than 50% was on counseling.     Dianely Krehbiel K Oliana Gowens, NP 05/27/24 8:03 PM

## 2024-05-28 ENCOUNTER — Telehealth: Payer: Self-pay | Admitting: Nurse Practitioner

## 2024-05-28 ENCOUNTER — Inpatient Hospital Stay: Payer: Self-pay | Admitting: Nurse Practitioner

## 2024-05-28 ENCOUNTER — Inpatient Hospital Stay: Payer: Self-pay

## 2024-05-28 NOTE — Telephone Encounter (Signed)
 called Pt to see if she was coming in and she said no and she didn't want to reschedule.

## 2024-06-05 ENCOUNTER — Other Ambulatory Visit (HOSPITAL_BASED_OUTPATIENT_CLINIC_OR_DEPARTMENT_OTHER): Payer: Self-pay | Admitting: Family Medicine

## 2024-06-05 ENCOUNTER — Telehealth (HOSPITAL_BASED_OUTPATIENT_CLINIC_OR_DEPARTMENT_OTHER): Payer: Self-pay

## 2024-06-05 ENCOUNTER — Other Ambulatory Visit (HOSPITAL_BASED_OUTPATIENT_CLINIC_OR_DEPARTMENT_OTHER): Payer: Self-pay

## 2024-06-05 DIAGNOSIS — D509 Iron deficiency anemia, unspecified: Secondary | ICD-10-CM

## 2024-06-05 MED ORDER — IRON (FERROUS SULFATE) 325 (65 FE) MG PO TABS
325.0000 mg | ORAL_TABLET | ORAL | 1 refills | Status: DC
  Filled 2024-06-05: qty 30, 30d supply, fill #0

## 2024-06-05 NOTE — Telephone Encounter (Signed)
-----   Message from Raymond de Cuba, MD sent at 06/05/2024  1:24 PM EST ----- Patient has had 3 shows for hematology consult.  They did recommend interventions for patient to start with and then considering referring back to hematology if necessary.  Please discussed with patient recommendations to have labs completed and beginning oral iron supplementation.  I have sent a prescription for iron supplement to the pharmacy. ----- Message ----- From: Burton, Lacie K, NP Sent: 05/28/2024   9:06 AM EST To: Sonny JONETTA Kiang; Raymond J de Cuba, MD; Amber#  Good morning, Dr. de Cuba,  This patient has no-showed for hematology consult 3 times. I reviewed her chart and would recommend a new set of iron labs, start a trial of oral iron (ferrous sulfate , hemocyte, etc) with vitamin C source for 2-3 months, then repeat iron labs. If iron still low after that and agrees to see us , we can get her in at that time.   Thanks Lacie Burton NP

## 2024-06-16 ENCOUNTER — Other Ambulatory Visit (HOSPITAL_BASED_OUTPATIENT_CLINIC_OR_DEPARTMENT_OTHER): Payer: Self-pay

## 2024-07-30 ENCOUNTER — Other Ambulatory Visit: Payer: Self-pay

## 2024-07-30 ENCOUNTER — Other Ambulatory Visit (HOSPITAL_BASED_OUTPATIENT_CLINIC_OR_DEPARTMENT_OTHER): Payer: Self-pay

## 2024-07-30 ENCOUNTER — Encounter (HOSPITAL_BASED_OUTPATIENT_CLINIC_OR_DEPARTMENT_OTHER): Payer: Self-pay | Admitting: Family Medicine

## 2024-07-30 ENCOUNTER — Ambulatory Visit (HOSPITAL_BASED_OUTPATIENT_CLINIC_OR_DEPARTMENT_OTHER): Payer: Self-pay | Admitting: Family Medicine

## 2024-07-30 VITALS — BP 121/66 | HR 103 | Temp 97.9°F | Resp 18 | Ht 60.0 in | Wt 333.0 lb

## 2024-07-30 DIAGNOSIS — I5032 Chronic diastolic (congestive) heart failure: Secondary | ICD-10-CM

## 2024-07-30 DIAGNOSIS — D509 Iron deficiency anemia, unspecified: Secondary | ICD-10-CM

## 2024-07-30 DIAGNOSIS — M329 Systemic lupus erythematosus, unspecified: Secondary | ICD-10-CM

## 2024-07-30 DIAGNOSIS — L409 Psoriasis, unspecified: Secondary | ICD-10-CM

## 2024-07-30 DIAGNOSIS — M069 Rheumatoid arthritis, unspecified: Secondary | ICD-10-CM

## 2024-07-30 DIAGNOSIS — J9611 Chronic respiratory failure with hypoxia: Secondary | ICD-10-CM

## 2024-07-30 DIAGNOSIS — J8410 Pulmonary fibrosis, unspecified: Secondary | ICD-10-CM

## 2024-07-30 MED ORDER — IRON (FERROUS SULFATE) 325 (65 FE) MG PO TABS
325.0000 mg | ORAL_TABLET | ORAL | 1 refills | Status: AC
  Filled 2024-07-30: qty 30, 30d supply, fill #0

## 2024-07-30 MED ORDER — CLOBETASOL PROPIONATE 0.05 % EX CREA
1.0000 | TOPICAL_CREAM | Freq: Two times a day (BID) | CUTANEOUS | 1 refills | Status: AC
  Filled 2024-07-30: qty 30, 30d supply, fill #0

## 2024-07-30 MED ORDER — IPRATROPIUM-ALBUTEROL 0.5-2.5 (3) MG/3ML IN SOLN
3.0000 mL | Freq: Three times a day (TID) | RESPIRATORY_TRACT | 1 refills | Status: AC
  Filled 2024-07-30: qty 360, 40d supply, fill #0

## 2024-07-30 MED ORDER — PREDNISONE 20 MG PO TABS
20.0000 mg | ORAL_TABLET | Freq: Every day | ORAL | 0 refills | Status: AC
  Filled 2024-07-30: qty 90, 90d supply, fill #0

## 2024-07-30 MED ORDER — POLYETHYLENE GLYCOL 3350 17 GM/SCOOP PO POWD
17.0000 g | Freq: Every day | ORAL | 1 refills | Status: AC
  Filled 2024-07-30: qty 510, 30d supply, fill #0

## 2024-07-30 MED ORDER — FAMOTIDINE 20 MG PO TABS
20.0000 mg | ORAL_TABLET | Freq: Two times a day (BID) | ORAL | 1 refills | Status: AC
  Filled 2024-07-30: qty 180, 90d supply, fill #0

## 2024-07-30 NOTE — Progress Notes (Unsigned)
" ° ° °  Procedures performed today:    None.  Independent interpretation of notes and tests performed by another provider:   None.  Brief History, Exam, Impression, and Recommendations:    BP 121/66 (BP Location: Right Arm, Patient Position: Sitting, Cuff Size: Large)   Pulse (!) 103   Temp 97.9 F (36.6 C) (Oral)   Resp 18   Ht 5' (1.524 m)   Wt (!) 333 lb (151 kg)   SpO2 100%   BMI 65.03 kg/m   Rheumatoid arthritis involving multiple sites, unspecified whether rheumatoid factor present (HCC) -     Ambulatory referral to Rheumatology -     AMB Referral VBCI Care Management  Iron  deficiency anemia, unspecified iron  deficiency anemia type -     Iron  (Ferrous Sulfate ); Take 325 mg by mouth every Monday, Wednesday, and Friday.  Dispense: 30 tablet; Refill: 1 -     AMB Referral VBCI Care Management  Psoriasis -     Ambulatory referral to Rheumatology -     AMB Referral VBCI Care Management  Chronic respiratory failure with hypoxia (HCC) -     AMB Referral VBCI Care Management  Chronic heart failure with preserved ejection fraction (HFpEF) (HCC) -     Ambulatory referral to Cardiology -     AMB Referral VBCI Care Management  INTERSTITIAL LUNG DISEASE -     TSH Rfx on Abnormal to Free T4 -     Hemoglobin A1c -     Comprehensive metabolic panel with GFR -     CBC with Differential/Platelet  Systemic lupus erythematosus, unspecified SLE type, unspecified organ involvement status (HCC) -     TSH Rfx on Abnormal to Free T4 -     Hemoglobin A1c -     Comprehensive metabolic panel with GFR -     CBC with Differential/Platelet  Severe obesity (BMI >= 40) (HCC) -     TSH Rfx on Abnormal to Free T4 -     Hemoglobin A1c -     Comprehensive metabolic panel with GFR -     CBC with Differential/Platelet  Other orders -     Ipratropium-Albuterol ; Take 3 mLs by nebulization 3 (three) times daily.  Dispense: 360 mL; Refill: 1 -     Famotidine ; Take 1 tablet (20 mg total) by mouth  2 (two) times daily.  Dispense: 180 tablet; Refill: 1 -     Polyethylene Glycol 3350 ; Take 17 g by mouth daily.  Dispense: 510 g; Refill: 1 -     Clobetasol  Propionate; Apply 1 Application topically 2 (two) times daily.  Dispense: 30 g; Refill: 1 -     predniSONE ; Take 1 tablet (20 mg total) by mouth daily with breakfast.  Dispense: 90 tablet; Refill: 0  Return in about 4 months (around 11/27/2024) for chronic medical conditions, 60 minutes.  Spent 67 minutes on this patient encounter, including preparation, chart review, face-to-face counseling with patient and coordination of care, and documentation of encounter   ___________________________________________ Emilianna Barlowe de Cuba, MD, ABFM, CAQSM Primary Care and Sports Medicine [facility] "

## 2024-07-31 ENCOUNTER — Telehealth: Payer: Self-pay

## 2024-07-31 ENCOUNTER — Ambulatory Visit (HOSPITAL_BASED_OUTPATIENT_CLINIC_OR_DEPARTMENT_OTHER): Payer: Self-pay | Admitting: Family Medicine

## 2024-07-31 LAB — CBC WITH DIFFERENTIAL/PLATELET
Basophils Absolute: 0 10*3/uL (ref 0.0–0.2)
Basos: 1 %
EOS (ABSOLUTE): 0.1 10*3/uL (ref 0.0–0.4)
Eos: 1 %
Hematocrit: 36.5 % (ref 34.0–46.6)
Hemoglobin: 10.2 g/dL — ABNORMAL LOW (ref 11.1–15.9)
Immature Grans (Abs): 0 10*3/uL (ref 0.0–0.1)
Immature Granulocytes: 0 %
Lymphocytes Absolute: 0.9 10*3/uL (ref 0.7–3.1)
Lymphs: 13 %
MCH: 21.5 pg — ABNORMAL LOW (ref 26.6–33.0)
MCHC: 27.9 g/dL — ABNORMAL LOW (ref 31.5–35.7)
MCV: 77 fL — ABNORMAL LOW (ref 79–97)
Monocytes Absolute: 0.4 10*3/uL (ref 0.1–0.9)
Monocytes: 6 %
Neutrophils Absolute: 5.6 10*3/uL (ref 1.4–7.0)
Neutrophils: 79 %
Platelets: 268 10*3/uL (ref 150–450)
RBC: 4.75 x10E6/uL (ref 3.77–5.28)
RDW: 15 % (ref 11.7–15.4)
WBC: 7.1 10*3/uL (ref 3.4–10.8)

## 2024-07-31 LAB — COMPREHENSIVE METABOLIC PANEL WITH GFR
ALT: 10 [IU]/L (ref 0–32)
AST: 22 [IU]/L (ref 0–40)
Albumin: 4.1 g/dL (ref 3.9–4.9)
Alkaline Phosphatase: 96 [IU]/L (ref 41–116)
BUN/Creatinine Ratio: 20 (ref 9–23)
BUN: 9 mg/dL (ref 6–20)
Bilirubin Total: 0.6 mg/dL (ref 0.0–1.2)
CO2: 26 mmol/L (ref 20–29)
Calcium: 9 mg/dL (ref 8.7–10.2)
Chloride: 102 mmol/L (ref 96–106)
Creatinine, Ser: 0.44 mg/dL — ABNORMAL LOW (ref 0.57–1.00)
Globulin, Total: 3.6 g/dL (ref 1.5–4.5)
Glucose: 99 mg/dL (ref 70–99)
Potassium: 4.3 mmol/L (ref 3.5–5.2)
Sodium: 142 mmol/L (ref 134–144)
Total Protein: 7.7 g/dL (ref 6.0–8.5)
eGFR: 126 mL/min/{1.73_m2}

## 2024-07-31 LAB — HEMOGLOBIN A1C
Est. average glucose Bld gHb Est-mCnc: 114 mg/dL
Hgb A1c MFr Bld: 5.6 % (ref 4.8–5.6)

## 2024-07-31 LAB — T4F: T4,Free (Direct): 1 ng/dL (ref 0.82–1.77)

## 2024-07-31 LAB — TSH RFX ON ABNORMAL TO FREE T4: TSH: 6.43 u[IU]/mL — ABNORMAL HIGH (ref 0.450–4.500)

## 2024-07-31 NOTE — Progress Notes (Signed)
 Complex Care Management Note  Care Guide Note 07/31/2024 Name: Wren Pryce MRN: [id number] DOB: December 28, 1985  Inocente Rainey Sandoval-Chilel is a 39 y.o. year old female who sees de Cuba, Quintin PARAS, MD for primary care. I reached out to Inocente Rainey Sandoval-Chilel by phone today to offer complex care management services.  Ms. Dejager was given information about Complex Care Management services today including:   The Complex Care Management services include support from the care team which includes your Nurse Care Manager, Clinical Social Worker, or Pharmacist.  The Complex Care Management team is here to help remove barriers to the health concerns and goals most important to you. Complex Care Management services are voluntary, and the patient may decline or stop services at any time by request to their care team member.   Complex Care Management Consent Status: Patient agreed to services and verbal consent obtained.   Follow up plan:  Telephone appointment with complex care management team member scheduled for:  08/01/24, 08/07/24 & 08/12/24.  Encounter Outcome:  Patient Scheduled  Dreama [facility]  [facility], [facility] VBCI Assistant Direct Dial: [phone]  Fax: [phone]

## 2024-08-01 ENCOUNTER — Other Ambulatory Visit: Payer: Self-pay

## 2024-08-01 DIAGNOSIS — I5032 Chronic diastolic (congestive) heart failure: Secondary | ICD-10-CM

## 2024-08-01 NOTE — Progress Notes (Cosign Needed)
" ° °  08/01/2024 Name: Terrill Wauters MRN: [id number] DOB: [birth year]  Chief Complaint  Patient presents with   Medication Assistance    Donna Hall is a 39 y.o. year old female who presented for a telephone visit.   They were referred to the pharmacist by their PCP for assistance in managing medication access.   Translator services utilized by telephone - english-spanish translation.    Subjective:  Care Team: Primary Care Provider: de Cuba, Quintin PARAS, MD ; Next Scheduled Visit:   Future Appointments  Date Time Provider Department Center  08/07/2024  1:00 PM Arno Rosaline SQUIBB, RN [facility] None  08/07/2024  2:20 PM Jeffrie Oneil BROCKS, MD [facility] [id number]  08/12/2024  2:00 PM Maranda Lister D [facility] None  09/19/2024 11:00 AM Pandora Cadet, RPH [facility] None  11/27/2024  1:10 PM de Cuba, Raymond J, MD [facility] [phone] [address]     Medication Access/Adherence  Current Pharmacy:  [facility] [address] [place] [address] Phone: [phone] Fax: [phone]  [facility] [address] [place] [address] Phone: [phone] Fax: [phone]   Patient reports affordability concerns with their medications: yes given that she does not have any insurance. Has not applied for any assistance recently. (Upon chart review appears she has previously applied and was approved) Patient reports access/transportation concerns to their pharmacy: yes, does not have a car and cannot drive Patient reports adherence concerns with their medications: no specific concerns but does not know what medications she is currently taking or what they are for. Is not able to verbalize medication names d/t not being able to read. Noted that patient cannot read or write in english or spanish.    Objective:  Lab Results  Component Value Date   HGBA1C 5.6 07/30/2024    Lab Results  Component  Value Date   CREATININE 0.44 (L) 07/30/2024   BUN 9 07/30/2024   NA 142 07/30/2024   K 4.3 07/30/2024   CL 102 07/30/2024   CO2 26 07/30/2024    Lab Results  Component Value Date   CHOL 123 03/08/2012   HDL 45 03/08/2012   LDLCALC 64 03/08/2012   TRIG 71 03/08/2012   CHOLHDL 2.7 03/08/2012      Assessment/Plan:   Medication Management: - Currently strategy insufficient to maintain appropriate adherence to prescribed medication regimen - Discussed collaboration with local pharmacies for adherence packaging. Reviewed local pharmacies with adherence packaging options. Agreed to wait until status of medicaid is determined.   - reviewed medicaid eligibility and provided her with the [place] contact information, encouraged patient to call them for assistance in applying for [employer]. Also mentioned for patient to bring in all medication to upcoming cardiologist appointment Thursday 08/08/2023     Follow Up Plan: telephone f/u with pharmacist, encouraged patient to reach out with any questions prior to next scheduled telephone call.  09/19/2024 11:00 AM    Cadet Pandora, PharmD, BCGP Clinical Pharmacist  [phone]   "

## 2024-08-07 ENCOUNTER — Telehealth: Payer: Self-pay

## 2024-08-07 ENCOUNTER — Ambulatory Visit (HOSPITAL_BASED_OUTPATIENT_CLINIC_OR_DEPARTMENT_OTHER): Payer: Self-pay | Admitting: Cardiology

## 2024-08-12 ENCOUNTER — Telehealth: Payer: Self-pay | Admitting: Licensed Clinical Social Worker

## 2024-09-19 ENCOUNTER — Other Ambulatory Visit: Payer: Self-pay

## 2024-11-27 ENCOUNTER — Ambulatory Visit (HOSPITAL_BASED_OUTPATIENT_CLINIC_OR_DEPARTMENT_OTHER): Payer: Self-pay | Admitting: Family Medicine
